# Patient Record
Sex: Female | Born: 1955
Health system: Southern US, Community
[De-identification: ages and names within clinical notes are randomized; demographics above are authoritative.]

## PROBLEM LIST (undated history)

## (undated) ENCOUNTER — Emergency Department (HOSPITAL_COMMUNITY): Payer: BC Managed Care – PPO

## (undated) DIAGNOSIS — E785 Hyperlipidemia, unspecified: Secondary | ICD-10-CM

## (undated) DIAGNOSIS — J309 Allergic rhinitis, unspecified: Secondary | ICD-10-CM

## (undated) DIAGNOSIS — M1990 Unspecified osteoarthritis, unspecified site: Secondary | ICD-10-CM

## (undated) DIAGNOSIS — I10 Essential (primary) hypertension: Secondary | ICD-10-CM

## (undated) DIAGNOSIS — I679 Cerebrovascular disease, unspecified: Secondary | ICD-10-CM

## (undated) DIAGNOSIS — K219 Gastro-esophageal reflux disease without esophagitis: Secondary | ICD-10-CM

## (undated) DIAGNOSIS — Q231 Congenital insufficiency of aortic valve: Secondary | ICD-10-CM

## (undated) DIAGNOSIS — R002 Palpitations: Secondary | ICD-10-CM

## (undated) DIAGNOSIS — E559 Vitamin D deficiency, unspecified: Secondary | ICD-10-CM

## (undated) DIAGNOSIS — J45909 Unspecified asthma, uncomplicated: Secondary | ICD-10-CM

## (undated) DIAGNOSIS — B009 Herpesviral infection, unspecified: Secondary | ICD-10-CM

## (undated) HISTORY — DX: Cerebrovascular disease, unspecified: I67.9

## (undated) HISTORY — DX: Unspecified osteoarthritis, unspecified site: M19.90

## (undated) HISTORY — DX: Unspecified asthma, uncomplicated: J45.909

## (undated) HISTORY — DX: Gastro-esophageal reflux disease without esophagitis: K21.9

## (undated) HISTORY — DX: Herpesviral infection, unspecified: B00.9

## (undated) HISTORY — DX: Palpitations: R00.2

## (undated) HISTORY — DX: Hyperlipidemia, unspecified: E78.5

## (undated) HISTORY — DX: Congenital insufficiency of aortic valve: Q23.1

## (undated) HISTORY — DX: Allergic rhinitis, unspecified: J30.9

## (undated) HISTORY — PX: EYE SURGERY: SHX253

## (undated) HISTORY — DX: Vitamin D deficiency, unspecified: E55.9

## (undated) HISTORY — PX: OTHER SURGICAL HISTORY: SHX169

## (undated) HISTORY — PX: ABDOMINAL HYSTERECTOMY: SHX81

---

## 1997-12-12 ENCOUNTER — Other Ambulatory Visit: Admission: RE | Admit: 1997-12-12 | Discharge: 1997-12-12 | Payer: Self-pay | Admitting: General Surgery

## 1998-07-28 ENCOUNTER — Other Ambulatory Visit: Admission: RE | Admit: 1998-07-28 | Discharge: 1998-07-28 | Payer: Self-pay | Admitting: *Deleted

## 1999-10-13 ENCOUNTER — Other Ambulatory Visit: Admission: RE | Admit: 1999-10-13 | Discharge: 1999-10-13 | Payer: Self-pay | Admitting: *Deleted

## 2000-05-18 ENCOUNTER — Ambulatory Visit (HOSPITAL_COMMUNITY): Admission: RE | Admit: 2000-05-18 | Discharge: 2000-05-18 | Payer: Self-pay | Admitting: Internal Medicine

## 2000-05-18 ENCOUNTER — Encounter: Payer: Self-pay | Admitting: Internal Medicine

## 2000-10-04 ENCOUNTER — Other Ambulatory Visit: Admission: RE | Admit: 2000-10-04 | Discharge: 2000-10-04 | Payer: Self-pay | Admitting: *Deleted

## 2001-01-25 ENCOUNTER — Encounter: Payer: Self-pay | Admitting: Emergency Medicine

## 2001-01-25 ENCOUNTER — Emergency Department (HOSPITAL_COMMUNITY): Admission: EM | Admit: 2001-01-25 | Discharge: 2001-01-25 | Payer: Self-pay | Admitting: Emergency Medicine

## 2002-01-17 ENCOUNTER — Encounter: Payer: Self-pay | Admitting: Internal Medicine

## 2002-01-17 ENCOUNTER — Ambulatory Visit (HOSPITAL_COMMUNITY): Admission: RE | Admit: 2002-01-17 | Discharge: 2002-01-17 | Payer: Self-pay | Admitting: Internal Medicine

## 2003-05-26 ENCOUNTER — Emergency Department (HOSPITAL_COMMUNITY): Admission: EM | Admit: 2003-05-26 | Discharge: 2003-05-26 | Payer: Self-pay | Admitting: Emergency Medicine

## 2004-04-08 ENCOUNTER — Other Ambulatory Visit: Admission: RE | Admit: 2004-04-08 | Discharge: 2004-04-08 | Payer: Self-pay | Admitting: Internal Medicine

## 2004-06-05 ENCOUNTER — Encounter: Admission: RE | Admit: 2004-06-05 | Discharge: 2004-06-05 | Payer: Self-pay | Admitting: Internal Medicine

## 2004-10-31 ENCOUNTER — Emergency Department (HOSPITAL_COMMUNITY): Admission: EM | Admit: 2004-10-31 | Discharge: 2004-10-31 | Payer: Self-pay | Admitting: Family Medicine

## 2005-03-05 ENCOUNTER — Emergency Department (HOSPITAL_COMMUNITY): Admission: EM | Admit: 2005-03-05 | Discharge: 2005-03-05 | Payer: Self-pay | Admitting: Family Medicine

## 2005-07-15 ENCOUNTER — Ambulatory Visit (HOSPITAL_COMMUNITY): Admission: RE | Admit: 2005-07-15 | Discharge: 2005-07-15 | Payer: Self-pay | Admitting: Internal Medicine

## 2006-03-24 ENCOUNTER — Other Ambulatory Visit: Admission: RE | Admit: 2006-03-24 | Discharge: 2006-03-24 | Payer: Self-pay | Admitting: Internal Medicine

## 2006-07-18 ENCOUNTER — Ambulatory Visit (HOSPITAL_COMMUNITY): Admission: RE | Admit: 2006-07-18 | Discharge: 2006-07-18 | Payer: Self-pay | Admitting: Internal Medicine

## 2007-01-16 ENCOUNTER — Ambulatory Visit (HOSPITAL_COMMUNITY): Admission: RE | Admit: 2007-01-16 | Discharge: 2007-01-16 | Payer: Self-pay | Admitting: Internal Medicine

## 2007-01-20 ENCOUNTER — Encounter (INDEPENDENT_AMBULATORY_CARE_PROVIDER_SITE_OTHER): Payer: Self-pay | Admitting: Internal Medicine

## 2007-01-20 ENCOUNTER — Ambulatory Visit: Payer: Self-pay

## 2007-06-07 ENCOUNTER — Ambulatory Visit: Payer: Self-pay | Admitting: Cardiology

## 2007-06-16 ENCOUNTER — Ambulatory Visit: Payer: Self-pay

## 2007-07-14 ENCOUNTER — Ambulatory Visit: Payer: Self-pay | Admitting: Cardiology

## 2007-08-11 ENCOUNTER — Ambulatory Visit (HOSPITAL_COMMUNITY): Admission: RE | Admit: 2007-08-11 | Discharge: 2007-08-11 | Payer: Self-pay | Admitting: Internal Medicine

## 2008-04-24 ENCOUNTER — Other Ambulatory Visit: Admission: RE | Admit: 2008-04-24 | Discharge: 2008-04-24 | Payer: Self-pay | Admitting: Internal Medicine

## 2008-07-02 DIAGNOSIS — J45909 Unspecified asthma, uncomplicated: Secondary | ICD-10-CM | POA: Insufficient documentation

## 2008-07-02 DIAGNOSIS — I679 Cerebrovascular disease, unspecified: Secondary | ICD-10-CM | POA: Insufficient documentation

## 2008-07-02 DIAGNOSIS — M1990 Unspecified osteoarthritis, unspecified site: Secondary | ICD-10-CM | POA: Insufficient documentation

## 2008-07-02 DIAGNOSIS — K219 Gastro-esophageal reflux disease without esophagitis: Secondary | ICD-10-CM | POA: Insufficient documentation

## 2008-07-03 ENCOUNTER — Ambulatory Visit: Payer: Self-pay | Admitting: Cardiology

## 2008-07-03 DIAGNOSIS — Q231 Congenital insufficiency of aortic valve: Secondary | ICD-10-CM | POA: Insufficient documentation

## 2008-07-26 ENCOUNTER — Encounter: Payer: Self-pay | Admitting: Cardiology

## 2008-07-26 ENCOUNTER — Ambulatory Visit: Payer: Self-pay

## 2008-08-13 ENCOUNTER — Ambulatory Visit (HOSPITAL_COMMUNITY): Admission: RE | Admit: 2008-08-13 | Discharge: 2008-08-13 | Payer: Self-pay | Admitting: Internal Medicine

## 2008-11-06 ENCOUNTER — Ambulatory Visit: Payer: Self-pay | Admitting: Hematology & Oncology

## 2008-11-14 LAB — CBC WITH DIFFERENTIAL (CANCER CENTER ONLY)
BASO#: 0.1 10*3/uL (ref 0.0–0.2)
BASO%: 0.6 % (ref 0.0–2.0)
EOS%: 2 % (ref 0.0–7.0)
Eosinophils Absolute: 0.2 10*3/uL (ref 0.0–0.5)
HCT: 36.1 % (ref 34.8–46.6)
HGB: 11.9 g/dL (ref 11.6–15.9)
LYMPH#: 2.5 10*3/uL (ref 0.9–3.3)
LYMPH%: 32.4 % (ref 14.0–48.0)
MCH: 29.8 pg (ref 26.0–34.0)
MCHC: 33.1 g/dL (ref 32.0–36.0)
MCV: 90 fL (ref 81–101)
MONO#: 0.4 10*3/uL (ref 0.1–0.9)
MONO%: 5.6 % (ref 0.0–13.0)
NEUT#: 4.6 10*3/uL (ref 1.5–6.5)
NEUT%: 59.4 % (ref 39.6–80.0)
Platelets: 258 10*3/uL (ref 145–400)
RBC: 4 10*6/uL (ref 3.70–5.32)
RDW: 12.7 % (ref 10.5–14.6)
WBC: 7.7 10*3/uL (ref 3.9–10.0)

## 2008-11-14 LAB — CHCC SATELLITE - SMEAR

## 2008-11-17 LAB — RETICULOCYTES (CHCC)
ABS Retic: 56.4 10*3/uL (ref 19.0–186.0)
RBC.: 4.03 MIL/uL (ref 3.87–5.11)
Retic Ct Pct: 1.4 % (ref 0.4–3.1)

## 2008-11-17 LAB — TRANSFERRIN RECEPTOR, SOLUABLE: Transferrin Receptor, Soluble: 17.8 nmol/L

## 2008-11-17 LAB — IRON AND TIBC
%SAT: 10 % — ABNORMAL LOW (ref 20–55)
Iron: 34 ug/dL — ABNORMAL LOW (ref 42–145)
TIBC: 331 ug/dL (ref 250–470)
UIBC: 297 ug/dL

## 2008-11-17 LAB — FERRITIN: Ferritin: 16 ng/mL (ref 10–291)

## 2009-04-10 ENCOUNTER — Ambulatory Visit: Payer: Self-pay | Admitting: Cardiology

## 2009-04-17 ENCOUNTER — Ambulatory Visit: Payer: Self-pay | Admitting: Cardiology

## 2009-04-17 LAB — CONVERTED CEMR LAB
BUN: 12 mg/dL (ref 6–23)
CO2: 33 meq/L — ABNORMAL HIGH (ref 19–32)
Calcium: 9.4 mg/dL (ref 8.4–10.5)
Chloride: 102 meq/L (ref 96–112)
Creatinine, Ser: 0.6 mg/dL (ref 0.4–1.2)
GFR calc non Af Amer: 134.25 mL/min (ref >60)
Glucose, Bld: 79 mg/dL (ref 70–99)
Potassium: 3.2 meq/L — ABNORMAL LOW (ref 3.5–5.1)
Sodium: 141 meq/L (ref 135–145)

## 2009-04-28 ENCOUNTER — Ambulatory Visit: Payer: Self-pay | Admitting: Cardiology

## 2009-04-30 LAB — CONVERTED CEMR LAB
BUN: 14 mg/dL (ref 6–23)
CO2: 32 meq/L (ref 19–32)
Calcium: 9.4 mg/dL (ref 8.4–10.5)
Chloride: 106 meq/L (ref 96–112)
Creatinine, Ser: 0.6 mg/dL (ref 0.4–1.2)
GFR calc non Af Amer: 134.23 mL/min (ref >60)
Glucose, Bld: 108 mg/dL — ABNORMAL HIGH (ref 70–99)
Potassium: 3.6 meq/L (ref 3.5–5.1)
Sodium: 141 meq/L (ref 135–145)

## 2009-05-07 ENCOUNTER — Encounter: Payer: Self-pay | Admitting: Cardiology

## 2009-05-07 ENCOUNTER — Ambulatory Visit: Payer: Self-pay

## 2009-07-29 ENCOUNTER — Encounter: Admission: RE | Admit: 2009-07-29 | Discharge: 2009-07-29 | Payer: Self-pay | Admitting: Internal Medicine

## 2009-08-07 ENCOUNTER — Ambulatory Visit: Payer: Self-pay | Admitting: Hematology & Oncology

## 2009-08-08 ENCOUNTER — Ambulatory Visit (HOSPITAL_BASED_OUTPATIENT_CLINIC_OR_DEPARTMENT_OTHER): Admission: RE | Admit: 2009-08-08 | Discharge: 2009-08-08 | Payer: Self-pay | Admitting: Hematology & Oncology

## 2009-08-08 ENCOUNTER — Ambulatory Visit: Payer: Self-pay | Admitting: Radiology

## 2009-08-08 LAB — CBC WITH DIFFERENTIAL (CANCER CENTER ONLY)
BASO#: 0 10*3/uL (ref 0.0–0.2)
BASO%: 0.4 % (ref 0.0–2.0)
EOS%: 1.1 % (ref 0.0–7.0)
Eosinophils Absolute: 0.1 10*3/uL (ref 0.0–0.5)
HCT: 37.1 % (ref 34.8–46.6)
HGB: 12.5 g/dL (ref 11.6–15.9)
LYMPH#: 1.5 10*3/uL (ref 0.9–3.3)
LYMPH%: 14.7 % (ref 14.0–48.0)
MCH: 31.2 pg (ref 26.0–34.0)
MCHC: 33.7 g/dL (ref 32.0–36.0)
MCV: 93 fL (ref 81–101)
MONO#: 0.3 10*3/uL (ref 0.1–0.9)
MONO%: 3.2 % (ref 0.0–13.0)
NEUT#: 8.2 10*3/uL — ABNORMAL HIGH (ref 1.5–6.5)
NEUT%: 80.6 % — ABNORMAL HIGH (ref 39.6–80.0)
Platelets: 264 10*3/uL (ref 145–400)
RBC: 4.01 10*6/uL (ref 3.70–5.32)
RDW: 12.1 % (ref 10.5–14.6)
WBC: 10.2 10*3/uL — ABNORMAL HIGH (ref 3.9–10.0)

## 2009-08-08 LAB — CHCC SATELLITE - SMEAR

## 2009-08-10 LAB — RETICULOCYTES (CHCC)
ABS Retic: 55 10*3/uL (ref 19.0–186.0)
RBC.: 3.93 MIL/uL (ref 3.87–5.11)
Retic Ct Pct: 1.4 % (ref 0.4–3.1)

## 2009-08-10 LAB — FERRITIN: Ferritin: 63 ng/mL (ref 10–291)

## 2009-08-10 LAB — TRANSFERRIN RECEPTOR, SOLUABLE: Transferrin Receptor, Soluble: 17.1 nmol/L

## 2009-08-15 ENCOUNTER — Ambulatory Visit (HOSPITAL_COMMUNITY): Admission: RE | Admit: 2009-08-15 | Discharge: 2009-08-15 | Payer: Self-pay | Admitting: Internal Medicine

## 2009-10-30 ENCOUNTER — Ambulatory Visit: Payer: Self-pay | Admitting: Hematology & Oncology

## 2009-10-31 LAB — CBC WITH DIFFERENTIAL (CANCER CENTER ONLY)
BASO#: 0 10*3/uL (ref 0.0–0.2)
BASO%: 0.4 % (ref 0.0–2.0)
EOS%: 1.1 % (ref 0.0–7.0)
Eosinophils Absolute: 0.1 10*3/uL (ref 0.0–0.5)
HCT: 38.4 % (ref 34.8–46.6)
HGB: 12.5 g/dL (ref 11.6–15.9)
LYMPH#: 2.1 10*3/uL (ref 0.9–3.3)
LYMPH%: 22.6 % (ref 14.0–48.0)
MCH: 30.3 pg (ref 26.0–34.0)
MCHC: 32.6 g/dL (ref 32.0–36.0)
MCV: 93 fL (ref 81–101)
MONO#: 0.5 10*3/uL (ref 0.1–0.9)
MONO%: 5.4 % (ref 0.0–13.0)
NEUT#: 6.6 10*3/uL — ABNORMAL HIGH (ref 1.5–6.5)
NEUT%: 70.5 % (ref 39.6–80.0)
Platelets: 294 10*3/uL (ref 145–400)
RBC: 4.12 10*6/uL (ref 3.70–5.32)
RDW: 11.9 % (ref 10.5–14.6)
WBC: 9.3 10*3/uL (ref 3.9–10.0)

## 2009-10-31 LAB — IRON AND TIBC
%SAT: 16 % — ABNORMAL LOW (ref 20–55)
Iron: 52 ug/dL (ref 42–145)
TIBC: 330 ug/dL (ref 250–470)
UIBC: 278 ug/dL

## 2009-10-31 LAB — RETICULOCYTES (CHCC)
ABS Retic: 53.6 10*3/uL (ref 19.0–186.0)
RBC.: 4.12 MIL/uL (ref 3.87–5.11)
Retic Ct Pct: 1.3 % (ref 0.4–3.1)

## 2009-10-31 LAB — FERRITIN: Ferritin: 49 ng/mL (ref 10–291)

## 2009-10-31 LAB — CHCC SATELLITE - SMEAR

## 2010-01-22 ENCOUNTER — Ambulatory Visit: Payer: Self-pay | Admitting: Hematology & Oncology

## 2010-01-29 LAB — HM COLONOSCOPY

## 2010-03-17 NOTE — Assessment & Plan Note (Signed)
Summary: f1y/jss   History of Present Illness: The patient is a pleasant female that I have seen in the past for an abnormal electrocardiogram and palpitations.  The patient had a Myoview performed on Jun 16, 2007, that showed no ischemia or infarction.  The ejection fraction was 74%.  She also had carotid Dopplers performed on Jun 16, 2007, that showed 0% to 39% bilateral stenosis.  Finally, she had a CardioNet that showed no significant arrhythmias.  Note, she did not have palpitations during that time. I last saw her on Jul 13, 2008. We scheduled an echocardiogram which was performed in May of 2010 to reassess her history of bicuspid aortic valve. The aortic valve was not well visualized but there was no aortic insufficiency or aortic stenosis. LV function was normal. Since that time she is doing well symptomatically. She denies any dyspnea on exertion, orthopnea, pedal edema,  palpitations or syncope. She occasionally has some tightness in her chest when she lays flat but it improves with sitting up. She does not have exertional chest pain.  Current Medications (verified): 1)  Metformin Hcl 1000 Mg Tabs (Metformin Hcl) .... One Tablet Two Times A Day 2)  Glyburide 5 Mg Tabs (Glyburide) .... 1/2 Tab By Mouth Two Times A Day 3)  Hydrochlorothiazide 25 Mg Tabs (Hydrochlorothiazide) .... Take One Tablet By Mouth Daily. 4)  Pravastatin Sodium 40 Mg Tabs (Pravastatin Sodium) .... Take One Tablet By Mouth Everyother Day At Bedtime 5)  Aspirin 81 Mg Tbec (Aspirin) .... Take One Tablet By Mouth Daily 6)  Fish Oil   Oil (Fish Oil) .[name].. 1 Tab By Mouth Once Daily 7)  Calcium 600 Mg Tabs (Calcium) .[name].. 1 Tab By Mouth Once Daily 8)  Vitamin D 2000 Unit Tabs (Cholecalciferol) .... One Tablet Two Times A Day 9)  Multi-Vitamin  Tabs (Multiple Vitamin) .... One Tablet Daily 10)  Magonate 500 Mg Tabs (Magnesium Gluconate) .... 2 Tabs By Mouth Once Daily 11)  Vitamin C 1000 Mg Tabs (Ascorbic Acid) .... Tab By Mouth  Once Daily 12)  Omeprazole 40 Mg Cpdr (Omeprazole) .[name].. 1 Tab By Mouth Once Daily 13)  Ferrous Sulfate 325 (65 Fe) Mg  Tabs (Ferrous Sulfate) .[name].. 1 Tab By Mouth Once Daily 14)  Fiber 625 Mg Tabs (Calcium Polycarbophil) .[name].. 1 Tab By Mouth Once Daily  Allergies: No Known Drug Allergies  Past History:  Past Medical History: CEREBROVASCULAR DISEASE (ICD-437.9) GERD (ICD-530.81) HYPERLIPIDEMIA (ICD-272.4) HYPERTENSION (ICD-401.9) PALPITATIONS (ICD-785.1) DEGENERATIVE JOINT DISEASE (ICD-715.90) DIABETES MELLITUS (ICD-250.00) ASTHMA (ICD-493.90)  Past Surgical History: Reviewed history from 07/02/2008 and no changes required. repair of trigger finger.   Social History: Reviewed history from 07/02/2008 and no changes required. She does not smoke, rarely consumes alcohol.      Review of Systems       Occasional leg pain but no fevers or chills, productive cough, hemoptysis, dysphasia, odynophagia, melena, hematochezia, dysuria, hematuria, rash, seizure activity, orthopnea, PND, pedal edema, claudication. Remaining systems are negative.   Vital Signs:  Patient profile:   55 year old female Height:      58 inches Weight:      142 pounds BMI:     29.79 Pulse rate:   85 / minute Resp:     12 per minute BP sitting:   144 / 76  (left arm)  Vitals Entered By: Kem Parkinson (April 10, 2009 10:31 AM)  Physical Exam  General:  Well-developed well-nourished in no acute distress.  Skin is warm and dry.  HEENT is normal.  Neck is supple. No thyromegaly.  Chest is clear to auscultation with normal expansion.  Cardiovascular exam is regular rate and rhythm.  Abdominal exam nontender or distended. No masses palpated. Extremities show no edema. neuro grossly intact    EKG  Procedure date:  04/10/2009  Findings:      Sinus rhythm at a rate of 85. Cannot rule out prior septal and inferior infarcts.  Impression & Recommendations:  Problem # 1:  CEREBROVASCULAR DISEASE  (ICD-437.9)  Continue aspirin and statin. Schedule followup carotid Dopplers.  Orders: Carotid Duplex (Carotid Duplex)  Problem # 2:  HYPERTENSION (ICD-401.9) Blood pressure elevated. Add lisinopril 10 mg p.o. daily. Check bmet in one week. Her updated medication list for this problem includes:    Hydrochlorothiazide 25 Mg Tabs (Hydrochlorothiazide) .[name]... Take one tablet by mouth daily.    Aspirin 81 Mg Tbec (Aspirin) .[name]... Take one tablet by mouth daily    Lisinopril 10 Mg Tabs (Lisinopril) .[name]... Take one tablet by mouth daily  Problem # 3:  HYPERLIPIDEMIA (ICD-272.4) Continue statin. Lipids and liver monitored by her primary care. Her updated medication list for this problem includes:    Pravastatin Sodium 40 Mg Tabs (Pravastatin sodium) .[name]... Take one tablet by mouth everyother day at bedtime  Problem # 4:  BICUSPID AORTIC VALVE (ICD-746.4) Recent echocardiogram could not evaluate this. However no aortic stenosis or aortic insufficiency. Her updated medication list for this problem includes:    Aspirin 81 Mg Tbec (Aspirin) .[name]... Take one tablet by mouth daily    Lisinopril 10 Mg Tabs (Lisinopril) .[name]... Take one tablet by mouth daily  Problem # 5:  GERD (ICD-530.81)  The following medications were removed from the medication list:    Aciphex 20 Mg Tbec (Rabeprazole sodium) ..... One tablet daily Her updated medication list for this problem includes:    Omeprazole 40 Mg Cpdr (Omeprazole) .[name]... 1 tab by mouth once daily  Problem # 6:  PALPITATIONS (ICD-785.1) no recent symptoms. Her updated medication list for this problem includes:    Aspirin 81 Mg Tbec (Aspirin) .[name]... Take one tablet by mouth daily    Lisinopril 10 Mg Tabs (Lisinopril) .[name]... Take one tablet by mouth daily  Problem # 7:  DIABETES MELLITUS (ICD-250.00)  Her updated medication list for this problem includes:    Metformin Hcl 1000 Mg Tabs (Metformin hcl) ..... One tablet two times a day    Glyburide 5 Mg Tabs  (Glyburide) .[name]... 1/2 tab by mouth two times a day    Aspirin 81 Mg Tbec (Aspirin) .[name]... Take one tablet by mouth daily    Lisinopril 10 Mg Tabs (Lisinopril) .[name]... Take one tablet by mouth daily  Problem # 8:  ASTHMA (ICD-493.90)  Patient Instructions: 1)  Your physician recommends that you schedule a follow-up appointment in: one year 2)  Your physician recommends that you return for lab work in: 1 week--BMET 3)  Your physician has recommended you make the following change in your medication: please start lisinopril 10 mg everyday 4)  Your physician has requested that you have a carotid duplex. This test is an ultrasound of the carotid arteries in your neck. It looks at blood flow through these arteries that supply the brain with blood. Allow one hour for this exam. There are no restrictions or special instructions. Prescriptions: LISINOPRIL 10 MG TABS (LISINOPRIL) Take one tablet by mouth daily  #90 x 3   Entered by:   Ledon Snare, RN   Authorized  by:   Ferman Hamming, MD, [facility]   Signed by:   Ledon Snare, RN on 04/10/2009   Method used:   Electronically to        [facility] [phone] (retail)       [address]       [place], [place]  [address]       Ph: [phone]       Fax: [phone]   RxID:   [id number]

## 2010-03-17 NOTE — Assessment & Plan Note (Signed)
Summary: PER CHECK OUT/SF   History of Present Illness: The patient is a pleasant female that I have seen in the past for an abnormal electrocardiogram and palpitations.  The patient had a Myoview performed on Jun 16, 2007, that showed no ischemia or infarction.  The ejection fraction was 74%.  She also had carotid Dopplers performed on Jun 16, 2007, that showed 0% to 39% bilateral stenosis.  Finally, she had a CardioNet that showed no significant arrhythmias.  Note, she did not have palpitations during that time. I last saw her on Jul 14, 2007. Since that time she is doing well symptomatically. She denies any dyspnea on exertion, orthopnea, pedal edema, chest pain, palpitations or syncope.  Current Medications (verified): 1)  Metformin Hcl 1000 Mg Tabs (Metformin Hcl) .... One Tablet Two Times A Day 2)  Glyburide 5 Mg Tabs (Glyburide) .... 1/2 Tablet Daily 3)  Hydrochlorothiazide 25 Mg Tabs (Hydrochlorothiazide) .... Take One Tablet By Mouth Daily. 4)  Metoclopramide Hcl 10 Mg Tabs (Metoclopramide Hcl) .... One Tablet Daily 5)  Aciphex 20 Mg Tbec (Rabeprazole Sodium) .... One Tablet Daily 6)  Pravastatin Sodium 40 Mg Tabs (Pravastatin Sodium) .... Take One Tablet By Mouth Everyother Day At Bedtime 7)  Magnesium .... One Tablet Daily 8)  Aspirin 81 Mg Tbec (Aspirin) .... Take One Tablet By Mouth Daily 9)  Fish Oil .... One Tablet Daily 10)  Calcium .... One Tablet Daily 11)  Vitamin D 2000 Unit Tabs (Cholecalciferol) .... One Tablet Two Times A Day 12)  Multi-Vitamin  Tabs (Multiple Vitamin) .... One Tablet Daily 13)  Glucosamine .... One Tablet Daily  Allergies (verified): No Known Drug Allergies  Past History:  Past Medical History:    Reviewed history from 07/02/2008 and no changes required:    Current Problems:     CEREBROVASCULAR DISEASE (ICD-437.9)    CAROTID BRUIT, LEFT (ICD-785.9)    GERD (ICD-530.81)    HYPERLIPIDEMIA (ICD-272.4)    HYPERTENSION (ICD-401.9)    PALPITATIONS  (ICD-785.1)    DEGENERATIVE JOINT DISEASE (ICD-715.90)    DIABETES MELLITUS (ICD-250.00)    ASTHMA (ICD-493.90)  Social History:    Reviewed history from 07/02/2008 and no changes required:       She does not smoke, rarely consumes alcohol.            Review of Systems       no fevers or chills, productive cough, hemoptysis, dysphasia, odynophagia, melena, hematochezia, dysuria, hematuria, rash, seizure activity, orthopnea, PND, pedal edema, claudication. Remaining systems are negative.   Vital Signs:  Patient profile:   55 year old female Height:      58 inches Weight:      140 pounds BMI:     29.37 Pulse rate:   76 / minute Pulse rhythm:   regular BP sitting:   130 / 60 Cuff size:   regular  Physical Exam  General:  Well-developed well-nourished in no acute distress. Head:  HEENT is significant for a right lazy eye. normal eyelids. Neck:  Supple with no jugular venous distention and no thyromegaly noted. Left carotid bruit noted. Lungs:  clear to auscultation with normal expansion. Heart:   Regular rate and rhythm no  rubs or gallops noted. 2/6 systolic murmur at left sternal border. S2 is preserved. Abdomen:  soft and nontender. No masses palpated. No bruit noted. Extremities:  no edema. Neurologic:  grossly intact.   EKG  Procedure date:  07/03/2008  Findings:      Normal sinus  rhythm at rate of 76. Axis is normal. Cannot rule out prior septal infarct.  Impression & Recommendations:  Problem # 1:  BICUSPID AORTIC VALVE (ICD-746.4) We will repeat her echocardiogram. Her updated medication list for this problem includes:    Aspirin 81 Mg Tbec (Aspirin) .[name]... Take one tablet by mouth daily  Orders: Echocardiogram (Echo)  Problem # 2:  CEREBROVASCULAR DISEASE (ICD-437.9) Her previous carotid Doppler showed mild cerebrovascular disease. We will repeat those in one year when she returns. Continue aspirin and statin.  Problem # 3:  HYPERLIPIDEMIA  (ICD-272.4) Continue statin. Lipids and liver being monitored by her primary care physician. Her updated medication list for this problem includes:    Pravastatin Sodium 40 Mg Tabs (Pravastatin sodium) .[name]... Take one tablet by mouth everyother day at bedtime  Problem # 4:  HYPERTENSION (ICD-401.9) Blood pressure controlled on present medications. Will continue. Her primary care physician is monitoring her renal function and potassium. Her updated medication list for this problem includes:    Hydrochlorothiazide 25 Mg Tabs (Hydrochlorothiazide) .[name]... Take one tablet by mouth daily.    Aspirin 81 Mg Tbec (Aspirin) .[name]... Take one tablet by mouth daily  Problem # 5:  GERD (ICD-530.81)  Her updated medication list for this problem includes:    Aciphex 20 Mg Tbec (Rabeprazole sodium) ..... One tablet daily  Problem # 6:  DIABETES MELLITUS (ICD-250.00)  Her updated medication list for this problem includes:    Metformin Hcl 1000 Mg Tabs (Metformin hcl) ..... One tablet two times a day    Glyburide 5 Mg Tabs (Glyburide) .[name]... 1/2 tablet daily    Aspirin 81 Mg Tbec (Aspirin) .[name]... Take one tablet by mouth daily  Problem # 7:  PALPITATIONS (ICD-785.1) No recent symptoms. Her updated medication list for this problem includes:    Aspirin 81 Mg Tbec (Aspirin) .[name]... Take one tablet by mouth daily  Orders: EKG w/ Interpretation (93000)  Patient Instructions: 1)  Your physician recommends that you schedule a follow-up appointment in: ONE YEAR 2)  Your physician has requested that you have an echocardiogram.  Echocardiography is a painless test that uses sound waves to create images of your heart. It provides your doctor with information about the size and shape of your heart and how well your heart's chambers and valves are working.  This procedure takes approximately one hour. There are no restrictions for this procedure.

## 2010-03-24 ENCOUNTER — Encounter: Payer: Self-pay | Admitting: Cardiology

## 2010-03-24 ENCOUNTER — Ambulatory Visit (INDEPENDENT_AMBULATORY_CARE_PROVIDER_SITE_OTHER): Payer: BC Managed Care – PPO | Admitting: Cardiology

## 2010-03-24 DIAGNOSIS — I679 Cerebrovascular disease, unspecified: Secondary | ICD-10-CM

## 2010-03-24 DIAGNOSIS — E7800 Pure hypercholesterolemia, unspecified: Secondary | ICD-10-CM

## 2010-03-24 DIAGNOSIS — I10 Essential (primary) hypertension: Secondary | ICD-10-CM

## 2010-04-02 NOTE — Assessment & Plan Note (Signed)
Summary: rov/gd per pt call/gd   Visit Type:  Follow-up  CC:  '.  History of Present Illness: The patient is a pleasant female that I have seen in the past for an abnormal electrocardiogram and palpitations.  The patient had a Myoview performed on Jun 16, 2007, that showed no ischemia or infarction.  The ejection fraction was 74%.  She also had carotid Dopplers performed last in March of 2011 and showed 0% to 39% bilateral stenosis and thyroid cysts.  She had a CardioNet that showed no significant arrhythmias.  Note, she did not have palpitations during that time. Echocardiogram  was performed in June of 2010 to reassess her history of bicuspid aortic valve. The aortic valve was not well visualized but there was no aortic insufficiency or aortic stenosis. LV function was normal. Since I last saw her in Feb of 2011 she is doing well symptomatically. She denies any dyspnea on exertion, orthopnea, pedal edema,  palpitations or syncope. She does not have exertional chest pain.  Current Medications (verified): 1)  Metformin Hcl 1000 Mg Tabs (Metformin Hcl) .... One Tablet Two Times A Day 2)  Glyburide 5 Mg Tabs (Glyburide) .... 1/2 Tab By Mouth Two Times A Day 3)  Hydrochlorothiazide 25 Mg Tabs (Hydrochlorothiazide) .... Take One Tablet By Mouth Daily. 4)  Pravastatin Sodium 40 Mg Tabs (Pravastatin Sodium) .... Take One Tablet By Mouth Everyother Day At Bedtime 5)  Aspirin 81 Mg Tbec (Aspirin) .... Take One Tablet By Mouth Daily 6)  Fish Oil   Oil (Fish Oil) .[name].. 1 Tab By Mouth Once Daily 7)  Calcium 600 Mg Tabs (Calcium) .[name].. 1 Tab By Mouth Once Daily 8)  Vitamin D 2000 Unit Tabs (Cholecalciferol) .... One Tablet Two Times A Day 9)  Multi-Vitamin  Tabs (Multiple Vitamin) .... One Tablet Daily 10)  Magonate 500 Mg Tabs (Magnesium Gluconate) .... 2 Tabs By Mouth Once Daily 11)  Ferrous Sulfate 325 (65 Fe) Mg  Tabs (Ferrous Sulfate) .[name].. 1 Tab By Mouth Once Daily 12)  Fiber 625 Mg Tabs (Calcium  Polycarbophil) .[name].. 1 Tab By Mouth Once Daily 13)  Lisinopril 10 Mg Tabs (Lisinopril) .... Take One Tablet By Mouth Daily 14)  Klor-Con M20 20 Meq Cr-Tabs (Potassium Chloride Crys Cr) .... One Tablet By Mouth Once Daily  Allergies (verified): No Known Drug Allergies  Past History:  Past Medical History: Reviewed history from 04/10/2009 and no changes required. CEREBROVASCULAR DISEASE (ICD-437.9) GERD (ICD-530.81) HYPERLIPIDEMIA (ICD-272.4) HYPERTENSION (ICD-401.9) PALPITATIONS (ICD-785.1) DEGENERATIVE JOINT DISEASE (ICD-715.90) DIABETES MELLITUS (ICD-250.00) ASTHMA (ICD-493.90)  Past Surgical History: Reviewed history from 07/02/2008 and no changes required. repair of trigger finger.   Social History: Reviewed history from 07/02/2008 and no changes required. She does not smoke, rarely consumes alcohol.      Review of Systems       no fevers or chills, productive cough, hemoptysis, dysphasia, odynophagia, melena, hematochezia, dysuria, hematuria, rash, seizure activity, orthopnea, PND, pedal edema, claudication. Remaining systems are negative.   Vital Signs:  Patient profile:   55 year old female Height:      58 inches Weight:      133 pounds BMI:     27.90 Pulse rate:   84 / minute Pulse rhythm:   regular BP sitting:   116 / 69  (right arm)  Vitals Entered By: Jacquelin Hawking, CMA (March 24, 2010 8:59 AM)  Physical Exam  General:  Well-developed well-nourished in no acute distress.  Skin is warm and dry.  HEENT is normal.  Neck is supple. No thyromegaly.  Chest is clear to auscultation with normal expansion.  Cardiovascular exam is regular rate and rhythm.  Abdominal exam nontender or distended. No masses palpated. Extremities show no edema. neuro grossly intact    EKG  Procedure date:  03/24/2010  Findings:      Sinus rhythm at a rate of 76. Axis is normal. Cannot rule out prior septal infarct.  Impression & Recommendations:  Problem # 1:   BICUSPID AORTIC VALVE (ICD-746.4)  Not evident on previous echocardiogram but not well-visualized. She does have an ejection murmur. I will most likely repeat echocardiograms in the future. Her updated medication list for this problem includes:    Aspirin 81 Mg Tbec (Aspirin) .[name]... Take one tablet by mouth daily    Lisinopril 10 Mg Tabs (Lisinopril) .[name]... Take one tablet by mouth daily  Her updated medication list for this problem includes:    Aspirin 81 Mg Tbec (Aspirin) .[name]... Take one tablet by mouth daily    Lisinopril 10 Mg Tabs (Lisinopril) .[name]... Take one tablet by mouth daily  Problem # 2:  CEREBROVASCULAR DISEASE (ICD-437.9) Continue aspirin and statin. Followup carotid Dopplers March 2012. Orders: EKG w/ Interpretation (93000)  Problem # 3:  HYPERLIPIDEMIA (ICD-272.4)  Continue statin. Lipids and liver monitored by primary care. Her updated medication list for this problem includes:    Pravastatin Sodium 40 Mg Tabs (Pravastatin sodium) .[name]... Take one tablet by mouth everyother day at bedtime  Her updated medication list for this problem includes:    Pravastatin Sodium 40 Mg Tabs (Pravastatin sodium) .[name]... Take one tablet by mouth everyother day at bedtime  Problem # 4:  HYPERTENSION (ICD-401.9)  Blood pressure controlled on present medications. Will continue. Potassium and renal function monitored by primary care. Her updated medication list for this problem includes:    Hydrochlorothiazide 25 Mg Tabs (Hydrochlorothiazide) .[name]... Take one tablet by mouth daily.    Aspirin 81 Mg Tbec (Aspirin) .[name]... Take one tablet by mouth daily    Lisinopril 10 Mg Tabs (Lisinopril) .[name]... Take one tablet by mouth daily  Orders: EKG w/ Interpretation (93000)  Her updated medication list for this problem includes:    Hydrochlorothiazide 25 Mg Tabs (Hydrochlorothiazide) .[name]... Take one tablet by mouth daily.    Aspirin 81 Mg Tbec (Aspirin) .[name]... Take one tablet by mouth daily    Lisinopril  10 Mg Tabs (Lisinopril) .[name]... Take one tablet by mouth daily  Problem # 5:  DIABETES MELLITUS (ICD-250.00)  Her updated medication list for this problem includes:    Metformin Hcl 1000 Mg Tabs (Metformin hcl) ..... One tablet two times a day    Glyburide 5 Mg Tabs (Glyburide) .[name]... 1/2 tab by mouth two times a day    Aspirin 81 Mg Tbec (Aspirin) .[name]... Take one tablet by mouth daily    Lisinopril 10 Mg Tabs (Lisinopril) .[name]... Take one tablet by mouth daily  Her updated medication list for this problem includes:    Metformin Hcl 1000 Mg Tabs (Metformin hcl) ..... One tablet two times a day    Glyburide 5 Mg Tabs (Glyburide) .[name]... 1/2 tab by mouth two times a day    Aspirin 81 Mg Tbec (Aspirin) .[name]... Take one tablet by mouth daily    Lisinopril 10 Mg Tabs (Lisinopril) .[name]... Take one tablet by mouth daily  Problem # 6:  PALPITATIONS (ICD-785.1) No recent symptoms. Her updated medication list for this problem includes:    Aspirin 81  Mg Tbec (Aspirin) .[name]... Take one tablet by mouth daily    Lisinopril 10 Mg Tabs (Lisinopril) .[name]... Take one tablet by mouth daily  Her updated medication list for this problem includes:    Aspirin 81 Mg Tbec (Aspirin) .[name]... Take one tablet by mouth daily    Lisinopril 10 Mg Tabs (Lisinopril) .[name]... Take one tablet by mouth daily  Other Orders: Carotid Duplex (Carotid Duplex)  Patient Instructions: 1)  Your physician recommends that you schedule a follow-up appointment in: 1 year 2)  Your physician recommends that you continue on your current medications as directed. Please refer to the Current Medication list given to you today. 3)  Your physician has requested that you have a carotid duplex. This test is an ultrasound of the carotid arteries in your neck. It looks at blood flow through these arteries that supply the brain with blood. Allow one hour for this exam. There are no restrictions or special instructions.

## 2010-04-17 ENCOUNTER — Encounter (INDEPENDENT_AMBULATORY_CARE_PROVIDER_SITE_OTHER): Payer: BC Managed Care – PPO

## 2010-04-17 ENCOUNTER — Encounter: Payer: Self-pay | Admitting: Cardiology

## 2010-04-17 DIAGNOSIS — I6529 Occlusion and stenosis of unspecified carotid artery: Secondary | ICD-10-CM

## 2010-06-30 NOTE — Assessment & Plan Note (Signed)
[facility]                            CARDIOLOGY OFFICE NOTE   NAME:[name]                 MRN:          [id number]  DATE:06/07/2007                            DOB:          [birth year]    HISTORY OF PRESENT ILLNESS:  [name] is a very pleasant 55-  year-old female who I am asked to evaluate for an abnormal  electrocardiogram and palpitations.  She does have a history of a  Myoview performed on February 21, 2001, that showed ejection fraction of  68%, but no evidence of ischemia or infarction.  An echocardiogram in  December 2008 showed normal LV function.  There was a possibility of a  bicuspid aortic valve, but there was no clear aortic stenosis by Doppler  per Dr. Diona Browner.  [name] states that she has had problems  with palpitations.  These occur at night.  She wakes from sleep and  feels her heart racing.  This lasts 10-15 minutes and then it slowly  improves.  She has not had chest pain with this, shortness of breath,  presyncope or syncope.  She has mild dyspnea on exertion.  There is no  orthopnea, PND, pedal edema, or exertional chest pain.  Because of the  above, we were asked to further evaluate.  Also note that it was felt  that her electrocardiogram was abnormal as well.   MEDICATIONS:  1. Zantac 300 mg p.o. daily.  2. Metformin 1000 mg p.o. b.i.d.  3. __________ 10 mg p.o. t.i.d.  4. Pravachol 40 mg daily.  5. Glyburide 5 mg daily.  6. HCTZ 25 mg daily.  7. Aspirin 81 mg daily.  8. Multivitamin.  9. Fish oil.  10.Glucosamine.  11.Calcium.  12.Vitamin D.   ALLERGIES:  NO KNOWN DRUG ALLERGIES.   SOCIAL HISTORY:  She does not smoke, rarely consumes alcohol.   FAMILY HISTORY:  Positive for coronary artery disease in her mother.   PAST MEDICAL HISTORY:  1. Diabetes mellitus in 1995.  2. History of hypertension and hyperlipidemia.  3. History of asthma.  4. History of gastric reflux disease.  5. Prior hysterectomy.  6. History of DJD.  7. Prior repair of trigger finger.   REVIEW OF SYSTEMS:  She denies any headaches or fevers or chills.  She  does state that she has problems with reflux and has a cough after  eating.  There is no dysphagia, odynophagia, melena or hematochezia.  There is no hemoptysis.  She denies any dysuria, hematuria, rash or  seizure activity.  There is no orthopnea, PND or pedal edema.  She does  have some pain in her right leg, predominantly at night.  There is no  claudication.  The remaining systems are negative.   PHYSICAL EXAMINATION:  VITAL SIGNS:  Blood pressure of 125/77, pulse is  81.  She is well-developed, well-nourished, no acute distress.  SKIN:  Warm, dry.  She does not appear depressed.  There is no  peripheral clubbing.  She is status post repair of trigger finger on her  right hand. BACK:  Normal.  HEENT:  Significant for lateral deviation of  her right eye.  Her eyelids  are normal.  NECK:  Supple with a normal upstroke bilaterally.  There is a left  carotid bruit.  There is no thyromegaly noted.  CHEST:  Clear to auscultation.  Normal expansion.  CARDIOVASCULAR:  Regular rhythm with normal S1-S2.  There is a soft 1-  2/6 systolic murmur at the left sternal border.  There is no S3-S4.  ABDOMEN:  Nontender, nondistended.  Positive bowel sounds.  No  hepatosplenomegaly.  No mass appreciated.  There is no abdominal bruit.  She has 2+ femoral pulses bilaterally.  No bruits.  EXTREMITIES:  Show no edema that I can palpate.  No cords.  She has 2+  dorsalis pedis pulses bilaterally.  NEUROLOGICAL:  Grossly intact.  I do have an electrocardiogram from  March.  She has a sinus rhythm.  A prior septal infarct cannot be  excluded.  Note, a recent TSH was normal.   DIAGNOSES:  1. Abnormal electrocardiogram.  Her electrocardiogram is read as      cannot rule out prior septal infarct.  She also has mild dyspnea on      exertion as well as  approximately 13 years of diabetes mellitus.      We will plan to proceed with a Myoview for risk stratification.  If      it shows normal perfusion, we will not pursue further cardiac      workup.  2. Possible bicuspid aortic valve on previous echocardiogram.  She has      a soft murmur on examination, but there was no significant aortic      stenosis.  3.  Palpitations.  We will schedule her to have      CardioNet monitor to more fully evaluate.  3. Left carotid bruit.  We will schedule her to have carotid Dopplers.  4. Diabetes mellitus.  Per Dr. Oneta Rack.  5. Hypertension.  Blood pressure is adequately controlled.  6. Hyperlipidemia.  She will can continue on pravastatin and Dr.      Oneta Rack is following this.   I will see her back in four weeks to review the above.     Madolyn Frieze Jens Som, MD, [facility]  Electronically Signed   BSC/MedQ  DD: 06/07/2007  DT: 06/07/2007  Job #: [id number]   cc:   Lucky Cowboy, M.D.

## 2010-06-30 NOTE — Assessment & Plan Note (Signed)
[facility]                            CARDIOLOGY OFFICE NOTE   NAME:MCCAIN-LINDSEYRonnell, [name]                 MRN:          [id number]  DATE:07/14/2007                            DOB:          [birth year]    The patient is a pleasant 55 year old female that I recently saw for an  abnormal electrocardiogram and palpitations.  The patient had a Myoview  performed on Jun 16, 2007, that showed no ischemia or infarction.  The  ejection fraction was 74%.  She also had carotid Dopplers performed on  Jun 16, 2007, that showed 0% to 39% bilateral stenosis.  Finally, she had  a CardioNet that showed no significant arrhythmias.  Note, she did not  have palpitations during that time.  Since then, she denies any dyspnea  on exertion, orthopnea, PND, pedal edema, presyncope, syncope, or  exertional chest pain.  She did not have palpitations, since being  evaluated nor while wearing the monitor.   MEDICATIONS:  1. Ranitidine 300 mg p.o. daily.  2. Metformin 1000 mg p.o. b.i.d.  3. __________  10 mg p.o. t.i.d.  4. Pravachol 40 mg p.o. at bedtime.  5. Glyburide 5 mg p.o. daily.  6. Hydrochlorothiazide 25 mg p.o. daily.  7. Aspirin 81 mg p.o. daily.  8. Multivitamin.  9. Fish oil.  10.Glucosamine.  11.Calcium and vitamin D.   PHYSICAL EXAMINATION:  Blood pressure of 113/77 and pulse of 79.  She  weighs 138 pounds.  HEENT:  Normal.  NECK:  Supple.  CHEST:  Clear.  CARDIOVASCULAR:  Regular rate and rhythm.  ABDOMEN:  No tenderness.  EXTREMITIES:  No edema.   DIAGNOSES:  1. Recent abnormal electrocardiogram - Myoview is normal.  We will not      pursue further ischemia evaluation.  2. Possible bicuspid aortic valve on echocardiogram in December 2008 -      we will plan to repeat echocardiograms in the future.  3. Palpitations - her CardioNet monitor was unremarkable, and she has      had no further palpitations.  We will not pursue this further at      this  point.  4. Mild cerebrovascular disease - she will continue on her aspirin and      Pravachol.  5. Diabetes mellitus - management per Dr. Oneta Rack.  6. Hypertension - blood pressure is adequately controlled.  7. Hyperlipidemia - she will continue on her statin, and Dr. Oneta Rack      is following this.   We will see her back in 1 year.      Madolyn Frieze Jens Som, MD, [facility]  Electronically Signed    BSC/MedQ  DD: 07/14/2007  DT: 07/14/2007  Job #: [id number]   cc:   Lucky Cowboy, M.D.

## 2010-08-04 ENCOUNTER — Other Ambulatory Visit: Payer: Self-pay | Admitting: *Deleted

## 2010-08-04 MED ORDER — LISINOPRIL 10 MG PO TABS: 10.0000 mg | ORAL_TABLET | Freq: Every day | ORAL | Status: DC

## 2010-08-04 MED ORDER — POTASSIUM CHLORIDE CRYS ER 20 MEQ PO TBCR: 20.0000 meq | EXTENDED_RELEASE_TABLET | Freq: Every day | ORAL | Status: DC

## 2010-11-04 ENCOUNTER — Other Ambulatory Visit (HOSPITAL_COMMUNITY): Payer: Self-pay | Admitting: Internal Medicine

## 2010-11-04 DIAGNOSIS — Z1231 Encounter for screening mammogram for malignant neoplasm of breast: Secondary | ICD-10-CM

## 2010-11-13 ENCOUNTER — Ambulatory Visit (HOSPITAL_COMMUNITY)
Admission: RE | Admit: 2010-11-13 | Discharge: 2010-11-13 | Disposition: A | Discharge status: Home / Self Care | Payer: BC Managed Care – PPO | Source: Ambulatory Visit | Attending: Internal Medicine | Admitting: Internal Medicine

## 2010-11-13 DIAGNOSIS — Z1231 Encounter for screening mammogram for malignant neoplasm of breast: Secondary | ICD-10-CM | POA: Insufficient documentation

## 2010-11-19 ENCOUNTER — Other Ambulatory Visit: Payer: Self-pay | Admitting: Internal Medicine

## 2010-11-19 DIAGNOSIS — R928 Other abnormal and inconclusive findings on diagnostic imaging of breast: Secondary | ICD-10-CM

## 2010-12-02 ENCOUNTER — Ambulatory Visit
Admission: RE | Admit: 2010-12-02 | Discharge: 2010-12-02 | Disposition: A | Discharge status: Home / Self Care | Payer: BC Managed Care – PPO | Source: Ambulatory Visit | Attending: Internal Medicine | Admitting: Internal Medicine

## 2010-12-02 DIAGNOSIS — R928 Other abnormal and inconclusive findings on diagnostic imaging of breast: Secondary | ICD-10-CM

## 2011-02-25 ENCOUNTER — Encounter (HOSPITAL_COMMUNITY): Payer: Self-pay | Admitting: Emergency Medicine

## 2011-02-25 ENCOUNTER — Other Ambulatory Visit: Payer: Self-pay

## 2011-02-25 ENCOUNTER — Emergency Department (HOSPITAL_COMMUNITY): Payer: BC Managed Care – PPO

## 2011-02-25 ENCOUNTER — Emergency Department (HOSPITAL_COMMUNITY)
Admission: EM | Admit: 2011-02-25 | Discharge: 2011-02-25 | Disposition: A | Discharge status: Home / Self Care | Payer: BC Managed Care – PPO | Attending: Emergency Medicine | Admitting: Emergency Medicine

## 2011-02-25 DIAGNOSIS — E119 Type 2 diabetes mellitus without complications: Secondary | ICD-10-CM | POA: Insufficient documentation

## 2011-02-25 DIAGNOSIS — Z79899 Other long term (current) drug therapy: Secondary | ICD-10-CM | POA: Insufficient documentation

## 2011-02-25 DIAGNOSIS — I10 Essential (primary) hypertension: Secondary | ICD-10-CM | POA: Insufficient documentation

## 2011-02-25 DIAGNOSIS — R109 Unspecified abdominal pain: Secondary | ICD-10-CM | POA: Insufficient documentation

## 2011-02-25 DIAGNOSIS — M25519 Pain in unspecified shoulder: Secondary | ICD-10-CM | POA: Insufficient documentation

## 2011-02-25 DIAGNOSIS — R011 Cardiac murmur, unspecified: Secondary | ICD-10-CM | POA: Insufficient documentation

## 2011-02-25 DIAGNOSIS — Z7982 Long term (current) use of aspirin: Secondary | ICD-10-CM | POA: Insufficient documentation

## 2011-02-25 DIAGNOSIS — R0789 Other chest pain: Secondary | ICD-10-CM | POA: Insufficient documentation

## 2011-02-25 HISTORY — DX: Essential (primary) hypertension: I10

## 2011-02-25 LAB — CBC
HCT: 38.6 % (ref 36.0–46.0)
Hemoglobin: 12.6 g/dL (ref 12.0–15.0)
MCH: 30.7 pg (ref 26.0–34.0)
MCHC: 32.6 g/dL (ref 30.0–36.0)
MCV: 93.9 fL (ref 78.0–100.0)
Platelets: 275 10*3/uL (ref 150–400)
RBC: 4.11 MIL/uL (ref 3.87–5.11)
RDW: 13.5 % (ref 11.5–15.5)
WBC: 8.6 10*3/uL (ref 4.0–10.5)

## 2011-02-25 LAB — COMPREHENSIVE METABOLIC PANEL
ALT: 19 U/L (ref 0–35)
AST: 19 U/L (ref 0–37)
Albumin: 3.7 g/dL (ref 3.5–5.2)
Alkaline Phosphatase: 87 U/L (ref 39–117)
BUN: 16 mg/dL (ref 6–23)
CO2: 29 mEq/L (ref 19–32)
Calcium: 9 mg/dL (ref 8.4–10.5)
Chloride: 97 mEq/L (ref 96–112)
Creatinine, Ser: 0.54 mg/dL (ref 0.50–1.10)
GFR calc Af Amer: >90 mL/min (ref >90)
GFR calc non Af Amer: >90 mL/min (ref >90)
Glucose, Bld: 174 mg/dL — ABNORMAL HIGH (ref 70–99)
Potassium: 3.6 mEq/L (ref 3.5–5.1)
Sodium: 136 mEq/L (ref 135–145)
Total Bilirubin: 0.1 mg/dL — ABNORMAL LOW (ref 0.3–1.2)
Total Protein: 7.5 g/dL (ref 6.0–8.3)

## 2011-02-25 LAB — POCT I-STAT TROPONIN I: Troponin i, poc: 0 ng/mL (ref 0.00–0.08)

## 2011-02-25 NOTE — ED Provider Notes (Signed)
I saw and evaluated the patient, reviewed the resident's note and I agree with the findings and plan.  Veroncia Jezek P Qunicy Higinbotham, MD 02/25/11 1631 

## 2011-02-25 NOTE — ED Notes (Signed)
Pt staes it is a pressure and it comes and goes some nausea no cough or  Vomiting  midsternal pain no radiation

## 2011-02-25 NOTE — ED Notes (Signed)
Chest pain since last night pt here in er w/ her mother

## 2011-02-25 NOTE — ED Provider Notes (Signed)
I saw and evaluated the patient, reviewed the resident's note and I agree with the findings and plan.  56 year old, female, with insulin-dependent diabetes, and hypercholesterolemia.  He does not smoke cigarettes, presents to emergency department with intermittent chest tightness for several weeks.  The symptoms.  Last from 5 minutes to approximately an hour.  A car while she is at rest.  She does not get symptoms.  When she is ambulatory.  She denies associated nausea, vomiting, shortness breath, or diaphoresis.  With this.  She has not had leg pain or swelling.  She has not had a cough, fevers, or chills.  On examination.  She is normal with a EKG that does not show a STEMI negative cardiac enzymes.  We will release her to home with followup with her family doctor.  Nicholes Stairs, MD 02/25/11 1550

## 2011-02-25 NOTE — ED Provider Notes (Signed)
History     CSN: [id number]  Arrival date & time 02/25/11  1237   First MD Initiated Contact with Patient 02/25/11 1343      Chief Complaint  Patient presents with  . Chest Pain    (Consider location/radiation/quality/duration/timing/severity/associated sxs/prior treatment) Patient is a 56 y.o. female presenting with chest pain. The history is provided by the patient. No language interpreter was used.  Chest Pain The chest pain began yesterday. Chest pain occurs intermittently. The chest pain is unchanged. Associated with: No associated factors. At its most intense, the pain is at 7/10. The pain is currently at 0/10. The severity of the pain is moderate. The quality of the pain is described as tightness. The pain radiates to the left shoulder. Exacerbated by: No worsening factors. Primary symptoms include abdominal pain (1 week ago, none lately). Pertinent negatives for primary symptoms include no fever, no syncope, no shortness of breath, no cough, no wheezing, no palpitations, no nausea, no vomiting and no altered mental status.     Past Medical History  Diagnosis Date  . Diabetes mellitus   . Hypertension     No past surgical history on file.  No family history on file.  History  Substance Use Topics  . Smoking status: Not on file  . Smokeless tobacco: Not on file  . Alcohol Use:     OB History    Grav Para Term Preterm Abortions TAB SAB Ect Mult Living                  Review of Systems  Constitutional: Negative for fever.  Respiratory: Negative for cough, shortness of breath and wheezing.   Cardiovascular: Positive for chest pain. Negative for palpitations and syncope.  Gastrointestinal: Positive for abdominal pain (1 week ago, none lately). Negative for nausea and vomiting.  Psychiatric/Behavioral: Negative for altered mental status.  All other systems reviewed and are negative.    Allergies  Review of patient's allergies indicates no known  allergies.  Home Medications   Current Outpatient Rx  Name Route Sig Dispense Refill  . ASPIRIN EC 81 MG PO TBEC Oral Take 81 mg by mouth daily.    . SUPER B COMPLEX PO Oral Take 1 tablet by mouth daily.    [name] VITAMIN D 2000 UNITS PO CAPS Oral Take 1 capsule by mouth 2 (two) times daily.    . CYCLOSPORINE 0.05 % OP EMUL Both Eyes Place 1 drop into both eyes 2 (two) times daily.    [name] FERROUS SULFATE 325 (65 FE) MG PO TABS Oral Take 650 mg by mouth daily with breakfast.    . GLYBURIDE 5 MG PO TABS Oral Take 1.25 mg by mouth daily with breakfast.    . HYDROCHLOROTHIAZIDE 25 MG PO TABS Oral Take 25 mg by mouth daily.    [name] LISINOPRIL 10 MG PO TABS Oral Take 10 mg by mouth daily.    [name] MAGNESIUM PO Oral Take 2 tablets by mouth daily.    [name] METFORMIN HCL 1000 MG PO TABS Oral Take 1,000 mg by mouth 2 (two) times daily with a meal.    . ADULT MULTIVITAMIN W/MINERALS CH Oral Take 1 tablet by mouth daily.    . OMEGA-3-ACID ETHYL ESTERS 1 G PO CAPS Oral Take 2 g by mouth daily.    [name] PRAVASTATIN SODIUM 40 MG PO TABS Oral Take 40 mg by mouth every other day.      BP 156/66  Pulse 91  Temp 98.4 F (  36.9 C)  Resp 20  SpO2 100%  Physical Exam  Nursing note and vitals reviewed. Constitutional: She is oriented to person, place, and time. She appears well-developed and well-nourished. No distress.  HENT:  Head: Normocephalic and atraumatic.  Eyes: EOM are normal. Pupils are equal, round, and reactive to light.  Neck: Normal range of motion. Neck supple.  Cardiovascular: Normal rate and regular rhythm.  Exam reveals no friction rub.   Murmur (2/6 systolic murmur) heard. Pulmonary/Chest: Effort normal and breath sounds normal. No respiratory distress. She has no wheezes. She has no rales.  Abdominal: Soft. She exhibits no distension. There is no tenderness. There is no rebound.  Musculoskeletal: Normal range of motion. She exhibits no edema.  Neurological: She is alert and oriented to person, place,  and time.  Skin: She is not diaphoretic.    ED Course  Procedures (including critical care time)  Labs Reviewed  COMPREHENSIVE METABOLIC PANEL - Abnormal; Notable for the following:    Glucose, Bld 174 (*)    Total Bilirubin 0.1 (*)    All other components within normal limits  CBC  POCT I-STAT TROPONIN I  I-STAT TROPONIN I   Dg Chest 2 View  02/25/2011  *RADIOLOGY REPORT*  Clinical Data: Chest tightness.  CHEST - 2 VIEW  Comparison: 08/08/2009  Findings: Mild osteopenia. Midline trachea.  Normal heart size and mediastinal contours.  Suspect age advanced aortic atherosclerosis in the transverse segment. No pleural effusion or pneumothorax. Diffuse peribronchial thickening.  Clear lungs.  IMPRESSION:  1. No acute cardiopulmonary disease. 2.  Suspect age advanced transverse aortic atherosclerosis. 3. Peribronchial thickening which may relate to chronic bronchitis or smoking.  Original Report Authenticated By: Consuello Bossier, M.D.     1. Chest pain       MDM  56 year old female presents with chest tightness since yesterday. Episodes intermittent. No inciting, alleviating, exacerbating factors. History of diabetes and hypertension. Has had the pain before however never as severe. Has never had a workup for her pain. Cardiac disease in the family but none in the patient at this time. Denies nausea, vomiting, cough, dyspnea.   Afebrile vital signs are stable. Patient is a 2/6 systolic murmur. Lung sounds are clear. Equal pulses bilaterally. The patient's risk factors of diabetes, hypertension, hyperlipidemia will check troponin and other basic labs. Chest x-ray and EKG performed. Patient's labs unremarkable. Spoke with patient's primary care physician, Dr. Oneta Rack, regarding her presentation and how we do not feel is related to cardiac disease or pneumonia at this time. He stated that patient should receive a stress test as an outpatient. He agrees with plan of discharging home with close  followup. Informed the patient about the plan and she is comfortable with the plan. Discharged home in stable condition.       Elwin Mocha, MD 02/25/11 1540

## 2011-02-25 NOTE — ED Notes (Addendum)
Pt reports she developed chest pain last night described as tightness in her chest. She denies SOB,or N/V. States nothing makes the pain better or worse but pain comes in waves. Pt reports at its worst pain is 8/10.

## 2011-04-14 ENCOUNTER — Encounter: Payer: Self-pay | Admitting: Cardiovascular Disease

## 2011-04-14 ENCOUNTER — Encounter: Payer: Self-pay | Admitting: *Deleted

## 2011-04-15 ENCOUNTER — Ambulatory Visit (INDEPENDENT_AMBULATORY_CARE_PROVIDER_SITE_OTHER): Payer: BC Managed Care – PPO | Admitting: Cardiology

## 2011-04-15 ENCOUNTER — Encounter: Payer: Self-pay | Admitting: Cardiology

## 2011-04-15 DIAGNOSIS — Q231 Congenital insufficiency of aortic valve: Secondary | ICD-10-CM

## 2011-04-15 DIAGNOSIS — I679 Cerebrovascular disease, unspecified: Secondary | ICD-10-CM

## 2011-04-15 DIAGNOSIS — R002 Palpitations: Secondary | ICD-10-CM

## 2011-04-15 DIAGNOSIS — I10 Essential (primary) hypertension: Secondary | ICD-10-CM

## 2011-04-15 DIAGNOSIS — E785 Hyperlipidemia, unspecified: Secondary | ICD-10-CM

## 2011-04-15 MED ORDER — METOPROLOL SUCCINATE ER 25 MG PO TB24: 25.0000 mg | ORAL_TABLET | Freq: Every day | ORAL | Status: DC

## 2011-04-15 NOTE — Patient Instructions (Signed)
Your physician wants you to follow-up in: ONE YEAR You will receive a reminder letter in the mail two months in advance. If you don't receive a letter, please call our office to schedule the follow-up appointment.   START METOPROLOL SUCC 25 MG ONCE DAILY

## 2011-04-15 NOTE — Assessment & Plan Note (Signed)
Blood pressure elevated. Add Toprol 25 mg daily. 

## 2011-04-15 NOTE — Assessment & Plan Note (Signed)
Continue statin. Lipids and liver monitored by primary care. 

## 2011-04-15 NOTE — Progress Notes (Signed)
HPI:The patient is a pleasant female that I have seen in the past for an abnormal electrocardiogram, bicuspid aortic valve and palpitations.  The patient had a Myoview performed on Jun 16, 2007, that showed no ischemia or infarction.  The ejection fraction was 74%.  She also had carotid Dopplers performed last in March of 2012 and showed 0% to 39% bilateral stenosis.  She had a previous CardioNet that showed no significant arrhythmias.  Note, she did not have palpitations during that time. Echocardiogram  was performed in June of 2010 to reassess her history of bicuspid aortic valve. The aortic valve was not well visualized but there was no aortic insufficiency or aortic stenosis. LV function was normal. Since I last saw her in Feb of 2012 she has some dyspnea on exertion but no orthopnea or PND. Occasional mild pedal edema. No exertional chest pain. She occasionally feels like her heart rate is elevated. No sustained palpitations.  Current Outpatient Prescriptions  Medication Sig Dispense Refill  . aspirin EC 81 MG tablet Take 81 mg by mouth daily.      . B Complex-C (SUPER B COMPLEX PO) Take 1 tablet by mouth daily.      . Cholecalciferol (VITAMIN D) 2000 UNITS CAPS Take 1 capsule by mouth 2 (two) times daily.      . cycloSPORINE (RESTASIS) 0.05 % ophthalmic emulsion Place 1 drop into both eyes 2 (two) times daily.      . ferrous sulfate 325 (65 FE) MG tablet Take 650 mg by mouth daily with breakfast.      . glyBURIDE (DIABETA) 5 MG tablet 1/4 TAB PO QD      . hydrochlorothiazide (HYDRODIURIL) 25 MG tablet Take 25 mg by mouth daily.      [name] lisinopril (PRINIVIL,ZESTRIL) 10 MG tablet Take 10 mg by mouth daily.      [name] MAGNESIUM PO Take 2 tablets by mouth daily.      . metFORMIN (GLUCOPHAGE) 1000 MG tablet Take 1,000 mg by mouth 2 (two) times daily with a meal.      . Multiple Vitamin (MULITIVITAMIN WITH MINERALS) TABS Take 1 tablet by mouth daily.      [name] omega-3 acid ethyl esters (LOVAZA) 1 G  capsule Take 2 g by mouth daily.      . pravastatin (PRAVACHOL) 40 MG tablet Take 40 mg by mouth every other day.      . metoprolol succinate (TOPROL XL) 25 MG 24 hr tablet Take 1 tablet (25 mg total) by mouth daily.  90 tablet  4     Past Medical History  Diagnosis Date  . Diabetes mellitus   . Hypertension   . HYPERLIPIDEMIA   . CEREBROVASCULAR DISEASE   . BICUSPID AORTIC VALVE   . GERD   . DEGENERATIVE JOINT DISEASE   . ASTHMA     Past Surgical History  Procedure Date  . Repair of trigger finger     History   Social History  . Marital Status: Widowed    Spouse Name: N/A    Number of Children: N/A  . Years of Education: N/A   Occupational History  . Not on file.   Social History Main Topics  . Smoking status: Never Smoker   . Smokeless tobacco: Not on file  . Alcohol Use: Not on file  . Drug Use: Not on file  . Sexually Active: Not on file   Other Topics Concern  . Not on file   Social History Narrative  .  No narrative on file    ROS: no fevers or chills, productive cough, hemoptysis, dysphasia, odynophagia, melena, hematochezia, dysuria, hematuria, rash, seizure activity, orthopnea, PND,  claudication. Remaining systems are negative.  Physical Exam: Well-developed well-nourished in no acute distress.  Skin is warm and dry.  HEENT is normal.  Neck is supple. No thyromegaly.  Chest is clear to auscultation with normal expansion.  Cardiovascular exam is regular rate and rhythm. 2/6 systolic murmur left sternal border. S2 is not diminished. No diastolic murmur noted. Abdominal exam nontender or distended. No masses palpated. Extremities show no edema. neuro grossly intact  ECG 02/25/2011-sinus rhythm at a rate of 88. Axis normal. Cannot rule out prior septal infarct.

## 2011-04-15 NOTE — Assessment & Plan Note (Signed)
This was not noted on most recent echocardiogram but aortic valve was difficult to visualize. She does not sound to have significant aortic stenosis on examination. No diastolic murmur. Plan repeat echo when she returns in one year.

## 2011-04-15 NOTE — Assessment & Plan Note (Signed)
Continue aspirin and statin. Last carotid Dopplers showed 0-39% stenosis and no followup recommended.

## 2011-04-15 NOTE — Assessment & Plan Note (Signed)
She continues to have occasional palpitations. Blood pressure elevated. Add Toprol 25 mg daily.

## 2011-04-28 ENCOUNTER — Encounter: Payer: Self-pay | Admitting: Cardiology

## 2011-05-03 ENCOUNTER — Other Ambulatory Visit (HOSPITAL_COMMUNITY): Payer: Self-pay | Admitting: Internal Medicine

## 2011-05-03 DIAGNOSIS — R102 Pelvic and perineal pain: Secondary | ICD-10-CM

## 2011-05-11 ENCOUNTER — Ambulatory Visit (HOSPITAL_COMMUNITY)
Admission: RE | Admit: 2011-05-11 | Discharge: 2011-05-11 | Disposition: A | Discharge status: Home / Self Care | Payer: BC Managed Care – PPO | Source: Ambulatory Visit | Attending: Internal Medicine | Admitting: Internal Medicine

## 2011-05-11 DIAGNOSIS — Z90710 Acquired absence of both cervix and uterus: Secondary | ICD-10-CM | POA: Insufficient documentation

## 2011-05-11 DIAGNOSIS — R102 Pelvic and perineal pain: Secondary | ICD-10-CM

## 2011-05-11 DIAGNOSIS — N949 Unspecified condition associated with female genital organs and menstrual cycle: Secondary | ICD-10-CM | POA: Insufficient documentation

## 2011-05-13 ENCOUNTER — Other Ambulatory Visit: Payer: Self-pay | Admitting: Internal Medicine

## 2011-05-14 ENCOUNTER — Other Ambulatory Visit: Payer: BC Managed Care – PPO

## 2011-05-14 ENCOUNTER — Ambulatory Visit
Admission: RE | Admit: 2011-05-14 | Discharge: 2011-05-14 | Disposition: A | Discharge status: Home / Self Care | Payer: BC Managed Care – PPO | Source: Ambulatory Visit | Attending: Internal Medicine | Admitting: Internal Medicine

## 2011-05-14 MED ORDER — IOHEXOL 300 MG/ML  SOLN
100.0000 mL | Freq: Once | INTRAMUSCULAR | Status: AC | PRN
  Administered 2011-05-14: 100 mL via INTRAVENOUS

## 2011-05-20 ENCOUNTER — Other Ambulatory Visit: Payer: Self-pay

## 2011-05-20 MED ORDER — LISINOPRIL 10 MG PO TABS: 10.0000 mg | ORAL_TABLET | Freq: Every day | ORAL | Status: DC

## 2011-05-20 NOTE — Telephone Encounter (Signed)
..   Requested Prescriptions   Signed Prescriptions Disp Refills  . lisinopril (PRINIVIL,ZESTRIL) 10 MG tablet 30 tablet 11    Sig: Take 1 tablet (10 mg total) by mouth daily.    Authorizing Provider: Lewayne Bunting    Ordering User: Christella Hartigan, Dinnis Rog Judie Petit

## 2011-06-04 ENCOUNTER — Encounter: Payer: Self-pay | Admitting: Gastroenterology

## 2011-06-30 ENCOUNTER — Other Ambulatory Visit (HOSPITAL_COMMUNITY): Payer: Self-pay | Admitting: Internal Medicine

## 2011-06-30 DIAGNOSIS — Z1231 Encounter for screening mammogram for malignant neoplasm of breast: Secondary | ICD-10-CM

## 2011-07-07 ENCOUNTER — Other Ambulatory Visit: Payer: Self-pay | Admitting: Cardiology

## 2011-07-07 MED ORDER — LISINOPRIL 10 MG PO TABS: 10.0000 mg | ORAL_TABLET | Freq: Every day | ORAL | Status: DC

## 2011-11-17 ENCOUNTER — Encounter (HOSPITAL_COMMUNITY): Payer: Self-pay | Admitting: *Deleted

## 2011-11-17 ENCOUNTER — Encounter (HOSPITAL_COMMUNITY): Payer: Self-pay

## 2011-11-17 ENCOUNTER — Emergency Department (HOSPITAL_COMMUNITY): Payer: BC Managed Care – PPO

## 2011-11-17 ENCOUNTER — Emergency Department (HOSPITAL_COMMUNITY)
Admission: EM | Admit: 2011-11-17 | Discharge: 2011-11-17 | Disposition: A | Discharge status: Home / Self Care | Payer: BC Managed Care – PPO | Attending: Emergency Medicine | Admitting: Emergency Medicine

## 2011-11-17 ENCOUNTER — Emergency Department (HOSPITAL_COMMUNITY)
Admission: EM | Admit: 2011-11-17 | Discharge: 2011-11-17 | Disposition: A | Discharge status: Nursing Home (Includes ICF) | Payer: BC Managed Care – PPO | Source: Home / Self Care | Attending: Emergency Medicine | Admitting: Emergency Medicine

## 2011-11-17 ENCOUNTER — Other Ambulatory Visit: Payer: Self-pay

## 2011-11-17 DIAGNOSIS — R079 Chest pain, unspecified: Secondary | ICD-10-CM | POA: Insufficient documentation

## 2011-11-17 DIAGNOSIS — K219 Gastro-esophageal reflux disease without esophagitis: Secondary | ICD-10-CM | POA: Insufficient documentation

## 2011-11-17 DIAGNOSIS — M1990 Unspecified osteoarthritis, unspecified site: Secondary | ICD-10-CM | POA: Insufficient documentation

## 2011-11-17 DIAGNOSIS — R0602 Shortness of breath: Secondary | ICD-10-CM | POA: Insufficient documentation

## 2011-11-17 DIAGNOSIS — E785 Hyperlipidemia, unspecified: Secondary | ICD-10-CM | POA: Insufficient documentation

## 2011-11-17 DIAGNOSIS — Z8673 Personal history of transient ischemic attack (TIA), and cerebral infarction without residual deficits: Secondary | ICD-10-CM | POA: Insufficient documentation

## 2011-11-17 DIAGNOSIS — Z79899 Other long term (current) drug therapy: Secondary | ICD-10-CM | POA: Insufficient documentation

## 2011-11-17 DIAGNOSIS — E119 Type 2 diabetes mellitus without complications: Secondary | ICD-10-CM | POA: Insufficient documentation

## 2011-11-17 DIAGNOSIS — Z7982 Long term (current) use of aspirin: Secondary | ICD-10-CM | POA: Insufficient documentation

## 2011-11-17 DIAGNOSIS — I10 Essential (primary) hypertension: Secondary | ICD-10-CM | POA: Insufficient documentation

## 2011-11-17 DIAGNOSIS — J45909 Unspecified asthma, uncomplicated: Secondary | ICD-10-CM | POA: Insufficient documentation

## 2011-11-17 LAB — BASIC METABOLIC PANEL
BUN: 13 mg/dL (ref 6–23)
CO2: 28 mEq/L (ref 19–32)
Calcium: 9.9 mg/dL (ref 8.4–10.5)
Chloride: 98 mEq/L (ref 96–112)
Creatinine, Ser: 0.47 mg/dL — ABNORMAL LOW (ref 0.50–1.10)
GFR calc Af Amer: >90 mL/min (ref >90)
GFR calc non Af Amer: >90 mL/min (ref >90)
Glucose, Bld: 159 mg/dL — ABNORMAL HIGH (ref 70–99)
Potassium: 3.4 mEq/L — ABNORMAL LOW (ref 3.5–5.1)
Sodium: 139 mEq/L (ref 135–145)

## 2011-11-17 LAB — CBC WITH DIFFERENTIAL/PLATELET
Basophils Absolute: 0 10*3/uL (ref 0.0–0.1)
Basophils Relative: 0 % (ref 0–1)
Eosinophils Absolute: 0.1 10*3/uL (ref 0.0–0.7)
Eosinophils Relative: 1 % (ref 0–5)
HCT: 38.8 % (ref 36.0–46.0)
Hemoglobin: 12.7 g/dL (ref 12.0–15.0)
Lymphocytes Relative: 31 % (ref 12–46)
Lymphs Abs: 2.4 10*3/uL (ref 0.7–4.0)
MCH: 30.7 pg (ref 26.0–34.0)
MCHC: 32.7 g/dL (ref 30.0–36.0)
MCV: 93.7 fL (ref 78.0–100.0)
Monocytes Absolute: 0.5 10*3/uL (ref 0.1–1.0)
Monocytes Relative: 6 % (ref 3–12)
Neutro Abs: 4.8 10*3/uL (ref 1.7–7.7)
Neutrophils Relative %: 62 % (ref 43–77)
Platelets: 289 10*3/uL (ref 150–400)
RBC: 4.14 MIL/uL (ref 3.87–5.11)
RDW: 13.3 % (ref 11.5–15.5)
WBC: 7.8 10*3/uL (ref 4.0–10.5)

## 2011-11-17 LAB — TROPONIN I: Troponin I: <0.3 ng/mL (ref <0.30)

## 2011-11-17 MED ORDER — ASPIRIN 81 MG PO CHEW
CHEWABLE_TABLET | ORAL | Status: AC
  Filled 2011-11-17: qty 4

## 2011-11-17 MED ORDER — FAMOTIDINE 20 MG PO TABS: 20.0000 mg | ORAL_TABLET | Freq: Two times a day (BID) | ORAL | Status: DC

## 2011-11-17 MED ORDER — ASPIRIN 81 MG PO CHEW
324.0000 mg | CHEWABLE_TABLET | Freq: Once | ORAL | Status: AC
  Administered 2011-11-17: 324 mg via ORAL

## 2011-11-17 NOTE — ED Notes (Signed)
Tatyana, PA at the bedside  

## 2011-11-17 NOTE — ED Notes (Signed)
Pt went to urgent care prior to being seen here for chest tightness all across chest. Has been going on for a while but has progressively gotten worse the last few days. Stays SOB all the time. The pain mainly bothers her when she is lying down. Denies any other symptoms associated with the pain other than SOB.

## 2011-11-17 NOTE — ED Notes (Signed)
Pt   Reports    Chest  Tightness     X  2  Days      Pt  Describes the           Pain  As    Being  On a  Scale  Of  5     She  Reports        She  Has       Had  Similar   Symptoms       For  sev  Months   She  Is  A  Somewhat  Vague        Historian     She  Takes   bp  meds     And isosorbide       Pt   Was  Noted  To  Have  Sat  In  ucc   Parking  Lot  For  45  mins  Waiting  For  [place]  To  Open  She  Explains        Her  Skin is  Warm and  Dry  Her lungs  Are  Clear   She is  In no  Acute  Distress        Cap refill  Is  Brisk

## 2011-11-17 NOTE — ED Provider Notes (Signed)
Medical screening examination/treatment/procedure(s) were conducted as a shared visit with non-physician practitioner(s) and myself.  I personally evaluated the patient during the encounter  Cheri Guppy, MD 11/17/11 1614

## 2011-11-17 NOTE — ED Provider Notes (Addendum)
History     CSN: [id number]  Arrival date & time 11/17/11  0803   First MD Initiated Contact with Patient 11/17/11 [phone]      Chief Complaint  Patient presents with  . Chest Pain    (Consider location/radiation/quality/duration/timing/severity/associated sxs/prior treatment) HPI Comments: Patient presents urgent care this morning complaining of an ongoing intermittent chest pains at started months ago, recurrent describes  for the last 2-3 days she's been experiencing frequent retrosternal chest pains tightness, radiation towards the anterior aspect of her neck. Patient denies any shortness of breath, cough or fevers. Describes tightness character to the pain, she took an aspirin this morning. " I have been waiting for you to open the doors this morning for about an hour". Have felt some nausea this morning, no vomiting, no abdominal pain.  Patient is a 56 y.o. female presenting with chest pain. The history is provided by the patient.  Chest Pain The chest pain began more  than 1 month ago. Chest pain occurs intermittently. The chest pain is unchanged. At its most intense, the pain is at 6/10. The severity of the pain is moderate. The quality of the pain is described as tightness. The pain radiates to the right neck and left neck. Primary symptoms include nausea. Pertinent negatives for primary symptoms include no fever, no shortness of breath, no cough, no palpitations, no abdominal pain, no vomiting and no dizziness.  Pertinent negatives for associated symptoms include no diaphoresis, no numbness and no weakness. She tried aspirin for the symptoms.  Her past medical history is significant for bicuspid aortic valve.     Past Medical History  Diagnosis Date  . Diabetes mellitus   . Hypertension   . HYPERLIPIDEMIA   . CEREBROVASCULAR DISEASE   . BICUSPID AORTIC VALVE   . GERD   . DEGENERATIVE JOINT DISEASE   . ASTHMA     Past Surgical History  Procedure Date  . Repair of trigger  finger     Family History  Problem Relation Age of Onset  . Coronary artery disease Mother     History  Substance Use Topics  . Smoking status: Never Smoker   . Smokeless tobacco: Not on file  . Alcohol Use: Not on file    OB History    Grav Para Term Preterm Abortions TAB SAB Ect Mult Living                  Review of Systems  Constitutional: Negative for fever, chills, diaphoresis, activity change and appetite change.  Respiratory: Negative for cough and shortness of breath.   Cardiovascular: Positive for chest pain. Negative for palpitations and leg swelling.  Gastrointestinal: Positive for nausea. Negative for vomiting and abdominal pain.  Neurological: Negative for dizziness, weakness, numbness and headaches.    Allergies  Review of patient's allergies indicates no known allergies.  Home Medications   Current Outpatient Rx  Name Route Sig Dispense Refill  . ASPIRIN EC 81 MG PO TBEC Oral Take 81 mg by mouth daily.    . SUPER B COMPLEX PO Oral Take 1 tablet by mouth daily.    [name] VITAMIN D 2000 UNITS PO CAPS Oral Take 1 capsule by mouth 2 (two) times daily.    . CYCLOSPORINE 0.05 % OP EMUL Both Eyes Place 1 drop into both eyes 2 (two) times daily.    [name] FERROUS SULFATE 325 (65 FE) MG PO TABS Oral Take 650 mg by mouth daily with breakfast.    .  GLYBURIDE 5 MG PO TABS  1/4 TAB PO QD    . HYDROCHLOROTHIAZIDE 25 MG PO TABS Oral Take 25 mg by mouth daily.    [name] LISINOPRIL 10 MG PO TABS Oral Take 1 tablet (10 mg total) by mouth daily. 90 tablet 3  . MAGNESIUM PO Oral Take 2 tablets by mouth daily.    [name] METFORMIN HCL 1000 MG PO TABS Oral Take 1,000 mg by mouth 2 (two) times daily with a meal.    . METOPROLOL SUCCINATE ER 25 MG PO TB24 Oral Take 1 tablet (25 mg total) by mouth daily. 90 tablet 4  . ADULT MULTIVITAMIN W/MINERALS CH Oral Take 1 tablet by mouth daily.    . OMEGA-3-ACID ETHYL ESTERS 1 G PO CAPS Oral Take 2 g by mouth daily.    [name] PRAVASTATIN SODIUM 40 MG PO TABS  Oral Take 40 mg by mouth every other day.      BP 142/68  Pulse 82  Temp 98.6 F (37 C) (Oral)  Resp 16  SpO2 100%  Physical Exam  Nursing note and vitals reviewed. Constitutional: Vital signs are normal. She appears well-developed and well-nourished.  Non-toxic appearance. She does not have a sickly appearance. She does not appear ill.  Eyes: Pupils are equal, round, and reactive to light.  Neck: No JVD present.  Cardiovascular: Normal heart sounds.  Exam reveals no gallop and no friction rub.   No murmur heard. Pulmonary/Chest: Effort normal and breath sounds normal.  Abdominal: Soft.    ED Course  Procedures (including critical care time)  Labs Reviewed - No data to display No results found.   1. Chest pain at rest     EKG normal sinus rhythm suggestion of anteroseptal infarct as noted with q. waves on V2 and v3 no ST-T elevation or depression. No QT PR or QRS abnormalities.  MDM   Patient presents urgent care (waiting approximately one hour prior he is opening or doors), complaining of retrosternal chest pain. Patient is hemodynamically stable, positive risk factors for coronary artery disease. Abnormal EKG. Patient has been provided with aspirin and will be transferred to the emergency department in stable condition for further evaluation and and monitoring.     Jimmie Molly, MD 11/17/11 4782  Jimmie Molly, MD 11/17/11 [phone]

## 2011-11-17 NOTE — ED Provider Notes (Signed)
History     CSN: [id number]  Arrival date & time 11/17/11  4098   First MD Initiated Contact with Patient 11/17/11 [phone]      Chief Complaint  Patient presents with  . Chest Pain    (Consider location/radiation/quality/duration/timing/severity/associated sxs/prior treatment) HPI Comments: [name] is a 56 y.o. Female who presents with complaint of a chest pain. Pt states intermittent chest pains for about a month, with worsening and more frequent pain in the last few days. States pain is non exertional, random. States originally started at nigh time and with laying down, but now also gets it if she is out doing something or working. Pt states she has some shortness of breath, but states it is not severe. She denies fever, chills, cough, wheezing. States has seen cardiologist few years ago for the same, negative stress test at that time. States pain is across entire chest, radiating to the throat. Also reports burning sensation in upper left abdomen. No nausea, vomiting, problems with bowels.   The history is provided by the patient.    Past Medical History  Diagnosis Date  . Diabetes mellitus   . Hypertension   . HYPERLIPIDEMIA   . CEREBROVASCULAR DISEASE   . BICUSPID AORTIC VALVE   . GERD   . DEGENERATIVE JOINT DISEASE   . ASTHMA     Past Surgical History  Procedure Date  . Repair of trigger finger   . Abdominal hysterectomy     Family History  Problem Relation Age of Onset  . Coronary artery disease Mother     History  Substance Use Topics  . Smoking status: Never Smoker   . Smokeless tobacco: Not on file  . Alcohol Use: No    OB History    Grav Para Term Preterm Abortions TAB SAB Ect Mult Living                  Review of Systems  Constitutional: Negative for fever and chills.  HENT: Negative for neck pain and neck stiffness.   Respiratory: Positive for chest tightness and shortness of breath.   Cardiovascular: Positive for chest pain.  Negative for palpitations and leg swelling.  Gastrointestinal: Positive for abdominal pain. Negative for nausea, vomiting, diarrhea and constipation.  Genitourinary: Negative for dysuria.  Musculoskeletal: Negative.   Skin: Negative.   Neurological: Negative for dizziness, weakness and headaches.  Hematological: Negative.     Allergies  Review of patient's allergies indicates no known allergies.  Home Medications   Current Outpatient Rx  Name Route Sig Dispense Refill  . ASPIRIN EC 81 MG PO TBEC Oral Take 81 mg by mouth daily.    . SUPER B COMPLEX PO Oral Take 1 tablet by mouth daily.    [name] VITAMIN D 2000 UNITS PO CAPS Oral Take 1 capsule by mouth 2 (two) times daily.    . CYCLOSPORINE 0.05 % OP EMUL Both Eyes Place 1 drop into both eyes 2 (two) times daily.    [name] FERROUS SULFATE 325 (65 FE) MG PO TABS Oral Take 650 mg by mouth daily with breakfast.    . FLUTICASONE PROPIONATE 50 MCG/ACT NA SUSP Nasal Place 2 sprays into the nose daily as needed. For congestion    . GLYBURIDE 5 MG PO TABS Oral Take 1.25 mg by mouth 2 (two) times daily with a meal.     . HYDROCHLOROTHIAZIDE 25 MG PO TABS Oral Take 25 mg by mouth daily.    [name] LISINOPRIL 10 MG PO  TABS Oral Take 10 mg by mouth daily.    [name] MAGNESIUM PO Oral Take 2 tablets by mouth daily.    [name] METFORMIN HCL 1000 MG PO TABS Oral Take 1,000 mg by mouth 2 (two) times daily with a meal.    . METOPROLOL SUCCINATE ER 25 MG PO TB24 Oral Take 25 mg by mouth daily.    . ADULT MULTIVITAMIN W/MINERALS CH Oral Take 1 tablet by mouth daily.    . OMEGA-3-ACID ETHYL ESTERS 1 G PO CAPS Oral Take 2 g by mouth daily.    [name] POLYETHYLENE GLYCOL 3350 PO PACK Oral Take 17 g by mouth daily.    [name] PRAVASTATIN SODIUM 40 MG PO TABS Oral Take 40 mg by mouth every other day.      BP 133/63  Pulse 81  Temp 98.4 F (36.9 C) (Oral)  Resp 18  Ht 4\' 11"  (1.499 m)  Wt 143 lb (64.864 kg)  BMI 28.88 kg/m2  SpO2 99%  Physical Exam  Nursing note and vitals  reviewed. Constitutional: She appears well-developed and well-nourished. No distress.  HENT:  Head: Normocephalic.  Eyes: Conjunctivae normal are normal.  Neck: Neck supple.  Cardiovascular: Normal rate, regular rhythm and normal heart sounds.   Pulmonary/Chest: Effort normal and breath sounds normal. No respiratory distress. She has no wheezes. She has no rales.  Abdominal: Soft. Bowel sounds are normal. She exhibits no distension. There is no tenderness. There is no rebound.  Musculoskeletal: She exhibits no edema.  Neurological: She is alert.  Skin: Skin is warm and dry.  Psychiatric: She has a normal mood and affect. Her behavior is normal.    ED Course  Procedures (including critical care time)  Pt with atypical chest pain, now intermittent for 1 months, non exertional. Reports burning in left abdomen and radiation of discomfort into the throat. Hx of gerd. Will get labs, troponin, ecg, cxr to r/o cardiac source.    Date: 11/17/2011  Rate: 75  Rhythm: normal sinus rhythm  QRS Axis: normal  Intervals: normal  ST/T Wave abnormalities: nonspecific ST changes  Conduction Disutrbances:none  Narrative Interpretation: Q waves inferiorly and anteriorly  Old EKG Reviewed: unchanged   Results for orders placed during the hospital encounter of 11/17/11  CBC WITH DIFFERENTIAL      Component Value Range   WBC 7.8  4.0 - 10.5 K/uL   RBC 4.14  3.87 - 5.11 MIL/uL   Hemoglobin 12.7  12.0 - 15.0 g/dL   HCT 13.0  86.5 - 78.4 %   MCV 93.7  78.0 - 100.0 fL   MCH 30.7  26.0 - 34.0 pg   MCHC 32.7  30.0 - 36.0 g/dL   RDW 69.6  29.5 - 28.4 %   Platelets 289  150 - 400 K/uL   Neutrophils Relative 62  43 - 77 %   Neutro Abs 4.8  1.7 - 7.7 K/uL   Lymphocytes Relative 31  12 - 46 %   Lymphs Abs 2.4  0.7 - 4.0 K/uL   Monocytes Relative 6  3 - 12 %   Monocytes Absolute 0.5  0.1 - 1.0 K/uL   Eosinophils Relative 1  0 - 5 %   Eosinophils Absolute 0.1  0.0 - 0.7 K/uL   Basophils Relative 0  0 -  1 %   Basophils Absolute 0.0  0.0 - 0.1 K/uL  BASIC METABOLIC PANEL      Component Value Range   Sodium 139  135 - 145  mEq/L   Potassium 3.4 (*) 3.5 - 5.1 mEq/L   Chloride 98  96 - 112 mEq/L   CO2 28  19 - 32 mEq/L   Glucose, Bld 159 (*) 70 - 99 mg/dL   BUN 13  6 - 23 mg/dL   Creatinine, Ser 4.09 (*) 0.50 - 1.10 mg/dL   Calcium 9.9  8.4 - 81.1 mg/dL   GFR calc non Af Amer >90  >90 mL/min   GFR calc Af Amer >90  >90 mL/min  TROPONIN I      Component Value Range   Troponin I <0.30  <0.30 ng/mL   Dg Chest 2 View  11/17/2011  *RADIOLOGY REPORT*  Clinical Data: Chest pain.  CHEST - 2 VIEW  Comparison: None.  Findings: The heart size is normal.  The lungs are clear.  The visualized soft tissues and bony thorax are unremarkable.  IMPRESSION: No acute cardiopulmonary disease or significant interval change.   Original Report Authenticated By: Jamesetta Orleans. MATTERN, M.D.     12:11 PM Pt with negative troponin, negative CXR, negative labs. ECG showing new q waves but no sign of an acute MI. Pt's symptoms are atypical, worse at night, suspect GERD. Will start on pepcid, follow up with cardiology for further evaluation and possible stress test.    1. Chest pain   2. GERD (gastroesophageal reflux disease)       MDM  Pt with atypical, non exertional chest pain. She is in no distress. Troponin negative. CXR negative. ECG with no acute findings. Doubt ACS. She is perc negative.         Lottie Mussel, PA 11/17/11 1526

## 2011-11-17 NOTE — ED Provider Notes (Signed)
Medical screening examination/treatment/procedure(s) were conducted as a shared visit with non-physician practitioner(s) and myself.  I personally evaluated the patient during the encounter 51 y female with dm. C/o chest pain = "burning" radiates to neck. Worse lying down.  No n/v/sob/sweating. Denies cp with ambulation or climbing stairs.  No change with food.  PE normal.  ecg and ces normal.  Doubt acs.   More likely GERD.  Will realease  Cheri Guppy, MD 11/17/11 1213

## 2011-11-19 ENCOUNTER — Other Ambulatory Visit: Payer: Self-pay | Admitting: Internal Medicine

## 2011-11-19 ENCOUNTER — Ambulatory Visit (HOSPITAL_COMMUNITY)
Admission: RE | Admit: 2011-11-19 | Discharge: 2011-11-19 | Disposition: A | Discharge status: Home / Self Care | Payer: BC Managed Care – PPO | Source: Ambulatory Visit | Attending: Internal Medicine | Admitting: Internal Medicine

## 2011-11-19 DIAGNOSIS — Z1231 Encounter for screening mammogram for malignant neoplasm of breast: Secondary | ICD-10-CM

## 2011-11-19 DIAGNOSIS — N6310 Unspecified lump in the right breast, unspecified quadrant: Secondary | ICD-10-CM

## 2011-11-19 LAB — HM MAMMOGRAPHY

## 2011-11-25 ENCOUNTER — Other Ambulatory Visit: Payer: Self-pay | Admitting: Internal Medicine

## 2011-11-25 ENCOUNTER — Ambulatory Visit
Admission: RE | Admit: 2011-11-25 | Discharge: 2011-11-25 | Disposition: A | Discharge status: Home / Self Care | Payer: BC Managed Care – PPO | Source: Ambulatory Visit | Attending: Internal Medicine | Admitting: Internal Medicine

## 2011-11-25 DIAGNOSIS — N6310 Unspecified lump in the right breast, unspecified quadrant: Secondary | ICD-10-CM

## 2011-12-01 ENCOUNTER — Encounter: Payer: BC Managed Care – PPO | Admitting: Physician Assistant

## 2012-05-02 ENCOUNTER — Telehealth: Payer: Self-pay | Admitting: Cardiology

## 2012-05-02 NOTE — Telephone Encounter (Signed)
Pt has not taken BP/p at home, reviewed medication side effects for Nuvigil, can cause heart pounding in the ears, chest tightness and anxiety, she will call her PCP that prescribed the medication for advise. Pt has appointment this Friday 05/05/12 with Norma Fredrickson np.  Informed pt  I would update dr/nurse.

## 2012-05-02 NOTE — Telephone Encounter (Signed)
New problem    Pt is having elevated heart rate and can hear her heartbeat loud. Pt is taking  Klor-con 20mg  and Nuvigil 150mg  . Pt is sched to come in and see Lori on 05/05/12. Want Dr to know what is going on with her.

## 2012-05-05 ENCOUNTER — Ambulatory Visit (INDEPENDENT_AMBULATORY_CARE_PROVIDER_SITE_OTHER): Payer: BC Managed Care – PPO | Admitting: Nurse Practitioner

## 2012-05-05 ENCOUNTER — Encounter: Payer: Self-pay | Admitting: Nurse Practitioner

## 2012-05-05 VITALS — BP 110/62 | HR 86 | Ht 59.0 in | Wt 136.0 lb

## 2012-05-05 DIAGNOSIS — Q231 Congenital insufficiency of aortic valve: Secondary | ICD-10-CM

## 2012-05-05 LAB — BASIC METABOLIC PANEL
BUN: 23 mg/dL (ref 6–23)
CO2: 31 mEq/L (ref 19–32)
Calcium: 9.1 mg/dL (ref 8.4–10.5)
Chloride: 102 mEq/L (ref 96–112)
Creatinine, Ser: 0.7 mg/dL (ref 0.4–1.2)
GFR: 121.03 mL/min (ref >60.00)
Glucose, Bld: 145 mg/dL — ABNORMAL HIGH (ref 70–99)
Potassium: 4 mEq/L (ref 3.5–5.1)
Sodium: 141 mEq/L (ref 135–145)

## 2012-05-05 LAB — CBC WITH DIFFERENTIAL/PLATELET
Basophils Absolute: 0 10*3/uL (ref 0.0–0.1)
Basophils Relative: 0.2 % (ref 0.0–3.0)
Eosinophils Absolute: 0.1 10*3/uL (ref 0.0–0.7)
Eosinophils Relative: 1.1 % (ref 0.0–5.0)
HCT: 36 % (ref 36.0–46.0)
Hemoglobin: 11.9 g/dL — ABNORMAL LOW (ref 12.0–15.0)
Lymphocytes Relative: 28.9 % (ref 12.0–46.0)
Lymphs Abs: 2.2 10*3/uL (ref 0.7–4.0)
MCHC: 33 g/dL (ref 30.0–36.0)
MCV: 93.3 fl (ref 78.0–100.0)
Monocytes Absolute: 0.5 10*3/uL (ref 0.1–1.0)
Monocytes Relative: 7.1 % (ref 3.0–12.0)
Neutro Abs: 4.7 10*3/uL (ref 1.4–7.7)
Neutrophils Relative %: 62.7 % (ref 43.0–77.0)
Platelets: 264 10*3/uL (ref 150.0–400.0)
RBC: 3.85 Mil/uL — ABNORMAL LOW (ref 3.87–5.11)
RDW: 13.8 % (ref 11.5–14.6)
WBC: 7.6 10*3/uL (ref 4.5–10.5)

## 2012-05-05 LAB — TSH: TSH: 0.87 u[IU]/mL (ref 0.35–5.50)

## 2012-05-05 NOTE — Patient Instructions (Addendum)
We need to get an ultrasound of your heart  I would not recommend taking the Nuvigil - this makes your heart rate go faster  We are checking labs today  Regular exercise is encouraged  See Dr. Jens Som after your ultrasound so the two of you can review  Call the [facility] office at [phone] if you have any questions, problems or concerns.

## 2012-05-05 NOTE — Progress Notes (Signed)
[name] Date of Birth: 1956/02/08 Medical Record #[id number]  History of Present Illness: [name] is seen back today for a work in visit. She has been seen in the past by Dr. Jens Som. She has had an abnormal EKG, has a bicuspid AV, HLD, HTN and prior palpitations. Mild carotid stenosis from doppler in March of 2012 (0 to 39% bilaterally). Past history of negative Cardionet noted. EF has been normal. Last Myoview in May of 2009 showing no ischemia.   Last seen one year ago. BP was up. She was having palpitations. Toprol was added.   Called earlier this week. Complaining of elevated heart rate and being able to her her heart beating loud - has been on Nuvigil and potassium replacement was just restarted. It is time for her regular cardiology follow up (letter sent but she did not receive).   She comes in today. She is here alone. She is doing ok. Notes that she has an elevated heart rate on occasions - this frightens her - she has no associated symptoms with it. Not lightheaded or dizzy. No syncope. Occurs at rest. No regular exercise. 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 Has been on Nuvigil because of her shift work. Does not use caffeine. BP has been ok. Not short of breath. Some occasional swelling and she does like to use salt.    Current Outpatient Prescriptions on File Prior to Visit  Medication Sig Dispense Refill  . aspirin EC 81 MG tablet Take 81 mg by mouth daily.      . B Complex-C (SUPER B COMPLEX PO) Take 1 tablet by mouth daily.      . Cholecalciferol (VITAMIN D) 2000 UNITS CAPS Take 1 capsule by mouth 2 (two) times daily.      . cycloSPORINE (RESTASIS) 0.05 % ophthalmic emulsion Place 1 drop into both eyes 2 (two) times daily.      . ferrous sulfate 325 (65 FE) MG tablet Take 325 mg by mouth daily with breakfast.       . fluticasone (FLONASE) 50 MCG/ACT nasal spray Place 2 sprays into the nose daily as needed. For congestion      . glyBURIDE (DIABETA) 5 MG tablet Take 1.25  mg by mouth 2 (two) times daily with a meal. 1/4 pill in the am and 1/4 pill in the pm      . hydrochlorothiazide (HYDRODIURIL) 25 MG tablet Take 25 mg by mouth daily.      [name] lisinopril (PRINIVIL,ZESTRIL) 10 MG tablet Take 10 mg by mouth daily.      [name] MAGNESIUM PO Take 2 tablets by mouth daily.      . metFORMIN (GLUCOPHAGE) 1000 MG tablet Take 1,000 mg by mouth 2 (two) times daily with a meal.      . metoprolol succinate (TOPROL-XL) 25 MG 24 hr tablet Take 25 mg by mouth daily.      . Multiple Vitamin (MULITIVITAMIN WITH MINERALS) TABS Take 1 tablet by mouth daily.      [name] omega-3 acid ethyl esters (LOVAZA) 1 G capsule Take 2 g by mouth daily.      . pravastatin (PRAVACHOL) 40 MG tablet Take 40 mg by mouth every other day.       No current facility-administered medications on file prior to visit.    No Known Allergies  Past Medical History  Diagnosis Date  . Diabetes mellitus   . Hypertension   . HYPERLIPIDEMIA   . CEREBROVASCULAR DISEASE   . BICUSPID AORTIC VALVE   .  GERD   . DEGENERATIVE JOINT DISEASE   . ASTHMA   . Palpitation     Past Surgical History  Procedure Laterality Date  . Repair of trigger finger    . Abdominal hysterectomy      History  Smoking status  . Never Smoker   Smokeless tobacco  . Not on file    History  Alcohol Use No    Family History  Problem Relation Age of Onset  . Coronary artery disease Mother   . Heart disease Mother   . Diabetes Father   . Hypertension Father     Review of Systems: The review of systems is per the HPI.  All other systems were reviewed and are negative.  Physical Exam: BP 110/62  Pulse 86  Ht 4\' 11"  (1.499 m)  Wt 136 lb (61.689 kg)  BMI 27.45 kg/m2 Patient is very pleasant and in no acute distress. Skin is warm and dry. Color is normal.  HEENT is unremarkable except for an eye deformity. Normocephalic/atraumatic. PERRL. Sclera are nonicteric. Neck is supple. No masses. No JVD. Lungs are clear. Cardiac exam  shows a regular rate and rhythm. Systolic murmur noted. Abdomen is soft. Extremities are without edema. Gait and ROM are intact. No gross neurologic deficits noted.   LABORATORY DATA: Pending  Lab Results  Component Value Date   WBC 7.8 11/17/2011   HGB 12.7 11/17/2011   HCT 38.8 11/17/2011   PLT 289 11/17/2011   GLUCOSE 159* 11/17/2011   ALT 19 02/25/2011   AST 19 02/25/2011   NA 139 11/17/2011   K 3.4* 11/17/2011   CL 98 11/17/2011   CREATININE 0.47* 11/17/2011   BUN 13 11/17/2011   CO2 28 11/17/2011    Assessment / Plan: 1. Periodic elevated HR - most likely from her use of Nuvigil - I have asked her to try and not take. For now, keep her on her same dose of beta blocker therapy.   2. HTN - BP looks great. No change in her current regimen.   3. HLD - on statin therapy  4. Bicuspid aortic valve - needs her echo updated - will get this scheduled.   Will get her back to see Dr. Jens Som for her follow up. Check labs today. For now, no change in her medicines.   Patient is agreeable to this plan and will call if any problems develop in the interim.   Rosalio Macadamia, RN, ANP-C [facility] [address] [place], [place]  [address]

## 2012-05-10 ENCOUNTER — Ambulatory Visit (HOSPITAL_COMMUNITY): Payer: BC Managed Care – PPO | Attending: Cardiology | Admitting: Radiology

## 2012-05-10 DIAGNOSIS — I359 Nonrheumatic aortic valve disorder, unspecified: Secondary | ICD-10-CM

## 2012-05-10 DIAGNOSIS — E785 Hyperlipidemia, unspecified: Secondary | ICD-10-CM | POA: Insufficient documentation

## 2012-05-10 DIAGNOSIS — E119 Type 2 diabetes mellitus without complications: Secondary | ICD-10-CM | POA: Insufficient documentation

## 2012-05-10 DIAGNOSIS — I10 Essential (primary) hypertension: Secondary | ICD-10-CM | POA: Insufficient documentation

## 2012-05-10 DIAGNOSIS — R002 Palpitations: Secondary | ICD-10-CM | POA: Insufficient documentation

## 2012-05-10 DIAGNOSIS — Q231 Congenital insufficiency of aortic valve: Secondary | ICD-10-CM | POA: Insufficient documentation

## 2012-05-10 NOTE — Progress Notes (Signed)
Echocardiogram performed.  

## 2012-06-07 ENCOUNTER — Other Ambulatory Visit: Payer: Self-pay | Admitting: *Deleted

## 2012-06-07 MED ORDER — METOPROLOL SUCCINATE ER 25 MG PO TB24: 25.0000 mg | ORAL_TABLET | Freq: Every day | ORAL | Status: DC

## 2012-06-16 ENCOUNTER — Ambulatory Visit (INDEPENDENT_AMBULATORY_CARE_PROVIDER_SITE_OTHER): Payer: BC Managed Care – PPO | Admitting: Cardiology

## 2012-06-16 ENCOUNTER — Encounter: Payer: Self-pay | Admitting: Cardiology

## 2012-06-16 VITALS — BP 144/77 | HR 82

## 2012-06-16 DIAGNOSIS — Q231 Congenital insufficiency of aortic valve: Secondary | ICD-10-CM

## 2012-06-16 DIAGNOSIS — R002 Palpitations: Secondary | ICD-10-CM

## 2012-06-16 DIAGNOSIS — E785 Hyperlipidemia, unspecified: Secondary | ICD-10-CM

## 2012-06-16 DIAGNOSIS — I679 Cerebrovascular disease, unspecified: Secondary | ICD-10-CM

## 2012-06-16 DIAGNOSIS — I10 Essential (primary) hypertension: Secondary | ICD-10-CM

## 2012-06-16 DIAGNOSIS — I359 Nonrheumatic aortic valve disorder, unspecified: Secondary | ICD-10-CM

## 2012-06-16 NOTE — Assessment & Plan Note (Signed)
Continue statin. 

## 2012-06-16 NOTE — Assessment & Plan Note (Signed)
Continue present blood pressure medications. 

## 2012-06-16 NOTE — Progress Notes (Signed)
HPI: The patient is a pleasant female that I have seen in the past for an abnormal electrocardiogram, bicuspid aortic valve and palpitations. The patient had a Myoview performed on Jun 16, 2007, that showed no ischemia or infarction. The ejection fraction was 74%. She also had carotid Dopplers performed last in March of 2012 and showed 0% to 39% bilateral stenosis. She had a previous CardioNet that showed no significant arrhythmias. Note, she did not have palpitations during that time. Echocardiogram was performed in March of 2014 and showed normal LV function, no AS or AI, cannot exclude bicuspid valve. Hgb in March of 2014 11.9. TSH .87. Seen recently for elevated HR and Nuvigil DCed. Since then, she notes some dyspnea on exertion but no chest pain. Occasional elevated heart rate but improved. Occasional mild pedal edema. No syncope.  Current Outpatient Prescriptions  Medication Sig Dispense Refill  . Armodafinil (NUVIGIL) 150 MG tablet Take 150 mg by mouth as needed.      [name] aspirin EC 81 MG tablet Take 81 mg by mouth daily.      . B Complex-C (SUPER B COMPLEX PO) Take 1 tablet by mouth daily.      . Cholecalciferol (VITAMIN D) 2000 UNITS CAPS Take 1 capsule by mouth 2 (two) times daily.      . cycloSPORINE (RESTASIS) 0.05 % ophthalmic emulsion Place 1 drop into both eyes 2 (two) times daily.      . ferrous sulfate 325 (65 FE) MG tablet Take 325 mg by mouth daily with breakfast.       . FIBER FORMULA PO Take by mouth daily.      . fluticasone (FLONASE) 50 MCG/ACT nasal spray Place 2 sprays into the nose daily as needed. For congestion      . KLOR-CON M20 20 MEQ tablet Take 20 mEq by mouth 2 (two) times daily.       [name] lisinopril (PRINIVIL,ZESTRIL) 10 MG tablet Take 10 mg by mouth daily.      [name] lisinopril-hydrochlorothiazide (PRINZIDE,ZESTORETIC) 10-12.5 MG per tablet Take 1 tablet by mouth daily.      [name] MAGNESIUM PO Take 2 tablets by mouth daily.      . meloxicam (MOBIC) 15 MG tablet Take 15 mg  by mouth as needed for pain.      . metFORMIN (GLUCOPHAGE) 1000 MG tablet Take 1,000 mg by mouth 2 (two) times daily with a meal.      . metoprolol succinate (TOPROL-XL) 25 MG 24 hr tablet Take 1 tablet (25 mg total) by mouth daily.  90 tablet  3  . Multiple Vitamin (MULITIVITAMIN WITH MINERALS) TABS Take 1 tablet by mouth daily.      [name] omega-3 acid ethyl esters (LOVAZA) 1 G capsule Take 2 g by mouth daily.      . pravastatin (PRAVACHOL) 40 MG tablet Take 40 mg by mouth every other day.      . Ranitidine HCl (RANITIDINE 75 PO) Take 75 mg by mouth as needed.      . traMADol (ULTRAM) 50 MG tablet Take 50 mg by mouth as needed for pain.       No current facility-administered medications for this visit.     Past Medical History  Diagnosis Date  . Diabetes mellitus   . Hypertension   . HYPERLIPIDEMIA   . CEREBROVASCULAR DISEASE   . BICUSPID AORTIC VALVE   . GERD   . DEGENERATIVE JOINT DISEASE   . ASTHMA   . Palpitation  Past Surgical History  Procedure Laterality Date  . Repair of trigger finger    . Abdominal hysterectomy      History   Social History  . Marital Status: Widowed    Spouse Name: N/A    Number of Children: N/A  . Years of Education: N/A   Occupational History  . Not on file.   Social History Main Topics  . Smoking status: Never Smoker   . Smokeless tobacco: Not on file  . Alcohol Use: No  . Drug Use: No  . Sexually Active: Not Currently   Other Topics Concern  . Not on file   Social History Narrative  . No narrative on file    ROS: no fevers or chills, productive cough, hemoptysis, dysphasia, odynophagia, melena, hematochezia, dysuria, hematuria, rash, seizure activity, orthopnea, PND, pedal edema, claudication. Remaining systems are negative.  Physical Exam: Well-developed well-nourished in no acute distress.  Skin is warm and dry.  HEENT eye deviation Neck is supple.  Chest is clear to auscultation with normal expansion.  Cardiovascular  exam is regular rate and rhythm. 2/6 systolic murmur Abdominal exam nontender or distended. No masses palpated. Extremities show no edema. neuro grossly intact  ECG normal sinus rhythm at a rate of 82. Cannot rule out prior inferior infarct. Prior septal infarct. No ST changes.

## 2012-06-16 NOTE — Patient Instructions (Addendum)
Your physician wants you to follow-up in: 6 MONTHS WITH DR CRENSHAW You will receive a reminder letter in the mail two months in advance. If you don't receive a letter, please call our office to schedule the follow-up appointment.  

## 2012-06-16 NOTE — Assessment & Plan Note (Signed)
Continue aspirin and statin. 

## 2012-06-16 NOTE — Assessment & Plan Note (Signed)
Plan follow-up echocardiogram in the future. 

## 2012-06-16 NOTE — Assessment & Plan Note (Signed)
Symptoms have improved but she still has occasional mild elevation in heart rate. She recently began taking her Toprol daily. If her symptoms worsen I will increase his dose. We could also consider repeating her monitor. We'll see her back in 6 months to ensure she is stable.

## 2012-11-14 ENCOUNTER — Encounter (HOSPITAL_COMMUNITY): Payer: Self-pay | Admitting: Adult Health

## 2012-11-14 ENCOUNTER — Emergency Department (HOSPITAL_COMMUNITY)
Admission: EM | Admit: 2012-11-14 | Discharge: 2012-11-15 | Disposition: A | Discharge status: Home / Self Care | Payer: BC Managed Care – PPO | Attending: Emergency Medicine | Admitting: Emergency Medicine

## 2012-11-14 DIAGNOSIS — Z8719 Personal history of other diseases of the digestive system: Secondary | ICD-10-CM | POA: Insufficient documentation

## 2012-11-14 DIAGNOSIS — R109 Unspecified abdominal pain: Secondary | ICD-10-CM

## 2012-11-14 DIAGNOSIS — Z7982 Long term (current) use of aspirin: Secondary | ICD-10-CM | POA: Insufficient documentation

## 2012-11-14 DIAGNOSIS — Z8739 Personal history of other diseases of the musculoskeletal system and connective tissue: Secondary | ICD-10-CM | POA: Insufficient documentation

## 2012-11-14 DIAGNOSIS — J45909 Unspecified asthma, uncomplicated: Secondary | ICD-10-CM | POA: Insufficient documentation

## 2012-11-14 DIAGNOSIS — R1031 Right lower quadrant pain: Secondary | ICD-10-CM | POA: Insufficient documentation

## 2012-11-14 DIAGNOSIS — I10 Essential (primary) hypertension: Secondary | ICD-10-CM | POA: Insufficient documentation

## 2012-11-14 DIAGNOSIS — Z8679 Personal history of other diseases of the circulatory system: Secondary | ICD-10-CM | POA: Insufficient documentation

## 2012-11-14 DIAGNOSIS — E119 Type 2 diabetes mellitus without complications: Secondary | ICD-10-CM | POA: Insufficient documentation

## 2012-11-14 DIAGNOSIS — E785 Hyperlipidemia, unspecified: Secondary | ICD-10-CM | POA: Insufficient documentation

## 2012-11-14 DIAGNOSIS — Z79899 Other long term (current) drug therapy: Secondary | ICD-10-CM | POA: Insufficient documentation

## 2012-11-14 LAB — COMPREHENSIVE METABOLIC PANEL
ALT: 17 U/L (ref 0–35)
AST: 16 U/L (ref 0–37)
Albumin: 3.7 g/dL (ref 3.5–5.2)
Alkaline Phosphatase: 73 U/L (ref 39–117)
BUN: 16 mg/dL (ref 6–23)
CO2: 24 mEq/L (ref 19–32)
Calcium: 9.2 mg/dL (ref 8.4–10.5)
Chloride: 101 mEq/L (ref 96–112)
Creatinine, Ser: 0.41 mg/dL — ABNORMAL LOW (ref 0.50–1.10)
GFR calc Af Amer: >90 mL/min (ref >90)
GFR calc non Af Amer: >90 mL/min (ref >90)
Glucose, Bld: 113 mg/dL — ABNORMAL HIGH (ref 70–99)
Potassium: 3.7 mEq/L (ref 3.5–5.1)
Sodium: 138 mEq/L (ref 135–145)
Total Bilirubin: 0.1 mg/dL — ABNORMAL LOW (ref 0.3–1.2)
Total Protein: 7.2 g/dL (ref 6.0–8.3)

## 2012-11-14 LAB — CBC WITH DIFFERENTIAL/PLATELET
Basophils Absolute: 0 10*3/uL (ref 0.0–0.1)
Basophils Relative: 0 % (ref 0–1)
Eosinophils Absolute: 0.1 10*3/uL (ref 0.0–0.7)
Eosinophils Relative: 1 % (ref 0–5)
HCT: 38.9 % (ref 36.0–46.0)
Hemoglobin: 13.2 g/dL (ref 12.0–15.0)
Lymphocytes Relative: 29 % (ref 12–46)
Lymphs Abs: 2.1 10*3/uL (ref 0.7–4.0)
MCH: 31.8 pg (ref 26.0–34.0)
MCHC: 33.9 g/dL (ref 30.0–36.0)
MCV: 93.7 fL (ref 78.0–100.0)
Monocytes Absolute: 0.5 10*3/uL (ref 0.1–1.0)
Monocytes Relative: 7 % (ref 3–12)
Neutro Abs: 4.6 10*3/uL (ref 1.7–7.7)
Neutrophils Relative %: 63 % (ref 43–77)
Platelets: 250 10*3/uL (ref 150–400)
RBC: 4.15 MIL/uL (ref 3.87–5.11)
RDW: 13.2 % (ref 11.5–15.5)
WBC: 7.3 10*3/uL (ref 4.0–10.5)

## 2012-11-14 LAB — URINALYSIS, ROUTINE W REFLEX MICROSCOPIC
Bilirubin Urine: NEGATIVE
Glucose, UA: NEGATIVE mg/dL
Hgb urine dipstick: NEGATIVE
Ketones, ur: NEGATIVE mg/dL
Nitrite: NEGATIVE
Protein, ur: NEGATIVE mg/dL
Specific Gravity, Urine: 1.019 (ref 1.005–1.030)
Urobilinogen, UA: 0.2 mg/dL (ref 0.0–1.0)
pH: 8 (ref 5.0–8.0)

## 2012-11-14 LAB — URINE MICROSCOPIC-ADD ON

## 2012-11-14 MED ORDER — ACETAMINOPHEN 325 MG PO TABS
650.0000 mg | ORAL_TABLET | Freq: Once | ORAL | Status: AC
  Administered 2012-11-14: 650 mg via ORAL
  Filled 2012-11-14: qty 2

## 2012-11-14 NOTE — ED Notes (Signed)
Presents with lower right side abdominal pain that began suddenly at 5 pm tonight, denies nausea and diarrhea. Pain is described as "it doubled me over and I could hardly walk" pain has eased off a little, it is constant but intermittently worsens.  Pain does not radiate.

## 2012-11-14 NOTE — ED Provider Notes (Signed)
CSN: [id number]     Arrival date & time 11/14/12  2054 History   First MD Initiated Contact with Patient 11/14/12 2311     Chief Complaint  Patient presents with  . Abdominal Pain   (Consider location/radiation/quality/duration/timing/severity/associated sxs/prior Treatment) HPI Comments: [name] is a 57 y.o. Female who presents for evaluation of an episode of abdominal pain. The pain started at 5 PM tonight suddenly. It was sharp in nature and located in the right lower quadrant. The pain completely resolved after 2 hours. There were no associated symptoms. She denies nausea, vomiting, fever, chills, cough, shortness of breath, chest pain, back, pain, trouble walking, or weakness. She denies dysuria, hematuria, or urinary frequency. She has never had this previously. She is using her usual medications without relief. There are no known modifying factors   Patient is a 57 y.o. female presenting with abdominal pain. The history is provided by the patient.  Abdominal Pain   Past Medical History  Diagnosis Date  . Diabetes mellitus   . Hypertension   . HYPERLIPIDEMIA   . CEREBROVASCULAR DISEASE   . BICUSPID AORTIC VALVE   . GERD   . DEGENERATIVE JOINT DISEASE   . ASTHMA   . Palpitation    Past Surgical History  Procedure Laterality Date  . Repair of trigger finger    . Abdominal hysterectomy     Family History  Problem Relation Age of Onset  . Coronary artery disease Mother   . Heart disease Mother   . Diabetes Father   . Hypertension Father    History  Substance Use Topics  . Smoking status: Never Smoker   . Smokeless tobacco: Not on file  . Alcohol Use: No   OB History   Grav Para Term Preterm Abortions TAB SAB Ect Mult Living                 Review of Systems  Gastrointestinal: Positive for abdominal pain.  All other systems reviewed and are negative.    Allergies  Review of patient's allergies indicates no known allergies.  Home Medications    Current Outpatient Rx  Name  Route  Sig  Dispense  Refill  . Armodafinil (NUVIGIL) 150 MG tablet   Oral   Take 150 mg by mouth as needed (when working).          [name] aspirin EC 81 MG tablet   Oral   Take 81 mg by mouth daily.         . B Complex-C (SUPER B COMPLEX PO)   Oral   Take 1 tablet by mouth daily.         . Cholecalciferol (VITAMIN D) 2000 UNITS CAPS   Oral   Take 1 capsule by mouth 2 (two) times daily.         . cycloSPORINE (RESTASIS) 0.05 % ophthalmic emulsion   Both Eyes   Place 1 drop into both eyes daily.          . ferrous sulfate 325 (65 FE) MG tablet   Oral   Take 325 mg by mouth daily with breakfast.          . FIBER FORMULA PO   Oral   Take 1 tablet by mouth daily.          . fluticasone (FLONASE) 50 MCG/ACT nasal spray   Nasal   Place 2 sprays into the nose daily as needed. For congestion         .  KLOR-CON M20 20 MEQ tablet   Oral   Take 20 mEq by mouth 2 (two) times daily.          [name] lisinopril-hydrochlorothiazide (PRINZIDE,ZESTORETIC) 10-12.5 MG per tablet   Oral   Take 1 tablet by mouth daily.         [name] MAGNESIUM PO   Oral   Take 2 tablets by mouth daily.         . meloxicam (MOBIC) 15 MG tablet   Oral   Take 15 mg by mouth as needed for pain.         . metFORMIN (GLUCOPHAGE) 1000 MG tablet   Oral   Take 1,000 mg by mouth 2 (two) times daily with a meal.         . metoprolol succinate (TOPROL-XL) 25 MG 24 hr tablet   Oral   Take 1 tablet (25 mg total) by mouth daily.   90 tablet   3   . Multiple Vitamin (MULITIVITAMIN WITH MINERALS) TABS   Oral   Take 1 tablet by mouth daily.         [name] omega-3 acid ethyl esters (LOVAZA) 1 G capsule   Oral   Take 2 g by mouth daily.         . pravastatin (PRAVACHOL) 40 MG tablet   Oral   Take 40 mg by mouth every other day.         . ranitidine (ZANTAC) 300 MG tablet   Oral   Take 300 mg by mouth 2 (two) times daily.         . traMADol (ULTRAM) 50  MG tablet   Oral   Take 50 mg by mouth as needed for pain.          BP 122/68  Pulse 60  Temp(Src) 98 F (36.7 C) (Oral)  Resp 16  SpO2 98% Physical Exam  Nursing note and vitals reviewed. Constitutional: She is oriented to person, place, and time. She appears well-developed and well-nourished.  HENT:  Head: Normocephalic and atraumatic.  Eyes: Conjunctivae and EOM are normal. Pupils are equal, round, and reactive to light.  Neck: Normal range of motion and phonation normal. Neck supple.  Cardiovascular: Normal rate, regular rhythm and intact distal pulses.   Pulmonary/Chest: Effort normal and breath sounds normal. She exhibits no tenderness.  Abdominal: Soft. She exhibits no distension. There is no tenderness. There is no guarding.  Musculoskeletal: Normal range of motion.  Neurological: She is alert and oriented to person, place, and time. She exhibits normal muscle tone.  Skin: Skin is warm and dry.  Psychiatric: She has a normal mood and affect. Her behavior is normal. Judgment and thought content normal.    ED Course  Procedures (including critical care time)  Medications  acetaminophen (TYLENOL) tablet 650 mg (not administered)     Labs Review Labs Reviewed  URINALYSIS, ROUTINE W REFLEX MICROSCOPIC - Abnormal; Notable for the following:    APPearance CLOUDY (*)    Leukocytes, UA MODERATE (*)    All other components within normal limits  COMPREHENSIVE METABOLIC PANEL - Abnormal; Notable for the following:    Glucose, Bld 113 (*)    Creatinine, Ser 0.41 (*)    Total Bilirubin 0.1 (*)    All other components within normal limits  URINE MICROSCOPIC-ADD ON - Abnormal; Notable for the following:    Squamous Epithelial / LPF FEW (*)    All other components within normal limits  CBC WITH DIFFERENTIAL  MDM   1. Abdominal pain, acute    Episode of abdominal pain completely resolved now. This is unlikely to represent appendicitis, colitis, urinary tract  infection urolithiasis or occult infection. She spontaneously improved. I doubt abdominal vascular disorder, because of age, and spontaneous improvement. She is stable for discharge with symptomatic treatment.  Nursing Notes Reviewed/ Care Coordinated, and agree without changes. Applicable Imaging Reviewed.  Interpretation of Laboratory Data incorporated into ED treatment   Plan: Home Medications- usual plus Tylenol; Home Treatments and Observation- Rest, Fluids; return here if the recommended treatment, does not improve the symptoms; Recommended follow up- PCP prn      Flint Melter, MD 11/14/12 2340

## 2012-11-14 NOTE — ED Notes (Signed)
Pt has ride home.

## 2012-12-07 ENCOUNTER — Other Ambulatory Visit (HOSPITAL_COMMUNITY): Payer: Self-pay | Admitting: Emergency Medicine

## 2012-12-07 ENCOUNTER — Ambulatory Visit (HOSPITAL_COMMUNITY)
Admission: RE | Admit: 2012-12-07 | Discharge: 2012-12-07 | Disposition: A | Discharge status: Home / Self Care | Payer: BC Managed Care – PPO | Source: Ambulatory Visit | Attending: Emergency Medicine | Admitting: Emergency Medicine

## 2012-12-07 DIAGNOSIS — R209 Unspecified disturbances of skin sensation: Secondary | ICD-10-CM | POA: Insufficient documentation

## 2012-12-07 DIAGNOSIS — M479 Spondylosis, unspecified: Secondary | ICD-10-CM | POA: Insufficient documentation

## 2012-12-07 DIAGNOSIS — R202 Paresthesia of skin: Secondary | ICD-10-CM

## 2012-12-19 ENCOUNTER — Telehealth: Payer: Self-pay | Admitting: Internal Medicine

## 2012-12-19 MED ORDER — RANITIDINE HCL 300 MG PO TABS: 300.0000 mg | ORAL_TABLET | Freq: Every day | ORAL | Status: DC

## 2012-12-19 NOTE — Telephone Encounter (Signed)
PT WANTS REFILL ON RANITIDINE 300MG   90 DAYS SUPPLY RX # [id number]

## 2013-01-31 ENCOUNTER — Encounter: Payer: Self-pay | Admitting: Internal Medicine

## 2013-02-01 ENCOUNTER — Ambulatory Visit (INDEPENDENT_AMBULATORY_CARE_PROVIDER_SITE_OTHER): Payer: BC Managed Care – PPO | Admitting: Internal Medicine

## 2013-02-01 ENCOUNTER — Encounter: Payer: Self-pay | Admitting: Internal Medicine

## 2013-02-01 VITALS — BP 124/76 | HR 76 | Temp 98.1°F | Resp 16 | Wt 133.0 lb

## 2013-02-01 DIAGNOSIS — E1169 Type 2 diabetes mellitus with other specified complication: Secondary | ICD-10-CM | POA: Insufficient documentation

## 2013-02-01 DIAGNOSIS — I10 Essential (primary) hypertension: Secondary | ICD-10-CM

## 2013-02-01 DIAGNOSIS — E119 Type 2 diabetes mellitus without complications: Secondary | ICD-10-CM

## 2013-02-01 DIAGNOSIS — E782 Mixed hyperlipidemia: Secondary | ICD-10-CM

## 2013-02-01 DIAGNOSIS — Z79899 Other long term (current) drug therapy: Secondary | ICD-10-CM

## 2013-02-01 DIAGNOSIS — E559 Vitamin D deficiency, unspecified: Secondary | ICD-10-CM

## 2013-02-01 LAB — CBC WITH DIFFERENTIAL/PLATELET
Basophils Absolute: 0 10*3/uL (ref 0.0–0.1)
Basophils Relative: 0 % (ref 0–1)
Eosinophils Absolute: 0.3 10*3/uL (ref 0.0–0.7)
Eosinophils Relative: 4 % (ref 0–5)
HCT: 39.7 % (ref 36.0–46.0)
Hemoglobin: 13.1 g/dL (ref 12.0–15.0)
Lymphocytes Relative: 32 % (ref 12–46)
Lymphs Abs: 2.5 10*3/uL (ref 0.7–4.0)
MCH: 31.3 pg (ref 26.0–34.0)
MCHC: 33 g/dL (ref 30.0–36.0)
MCV: 95 fL (ref 78.0–100.0)
Monocytes Absolute: 0.5 10*3/uL (ref 0.1–1.0)
Monocytes Relative: 6 % (ref 3–12)
Neutro Abs: 4.4 10*3/uL (ref 1.7–7.7)
Neutrophils Relative %: 58 % (ref 43–77)
Platelets: 311 10*3/uL (ref 150–400)
RBC: 4.18 MIL/uL (ref 3.87–5.11)
RDW: 13.4 % (ref 11.5–15.5)
WBC: 7.6 10*3/uL (ref 4.0–10.5)

## 2013-02-01 LAB — TSH: TSH: 1.397 u[IU]/mL (ref 0.350–4.500)

## 2013-02-01 LAB — HEPATIC FUNCTION PANEL
ALT: 13 U/L (ref 0–35)
AST: 14 U/L (ref 0–37)
Albumin: 4.3 g/dL (ref 3.5–5.2)
Alkaline Phosphatase: 82 U/L (ref 39–117)
Bilirubin, Direct: <0.1 mg/dL (ref 0.0–0.3)
Total Bilirubin: 0.2 mg/dL — ABNORMAL LOW (ref 0.3–1.2)
Total Protein: 7.1 g/dL (ref 6.0–8.3)

## 2013-02-01 LAB — BASIC METABOLIC PANEL WITH GFR
BUN: 17 mg/dL (ref 6–23)
CO2: 33 mEq/L — ABNORMAL HIGH (ref 19–32)
Calcium: 9.4 mg/dL (ref 8.4–10.5)
Chloride: 101 mEq/L (ref 96–112)
Creat: 0.53 mg/dL (ref 0.50–1.10)
GFR, Est African American: >89 mL/min
GFR, Est Non African American: >89 mL/min
Glucose, Bld: 106 mg/dL — ABNORMAL HIGH (ref 70–99)
Potassium: 3.9 mEq/L (ref 3.5–5.3)
Sodium: 143 mEq/L (ref 135–145)

## 2013-02-01 LAB — LIPID PANEL
Cholesterol: 147 mg/dL (ref 0–200)
HDL: 46 mg/dL (ref >39)
LDL Cholesterol: 77 mg/dL (ref 0–99)
Total CHOL/HDL Ratio: 3.2 Ratio
Triglycerides: 121 mg/dL (ref <150)
VLDL: 24 mg/dL (ref 0–40)

## 2013-02-01 LAB — HEMOGLOBIN A1C
Hgb A1c MFr Bld: 6.7 % — ABNORMAL HIGH (ref <5.7)
Mean Plasma Glucose: 146 mg/dL — ABNORMAL HIGH (ref <117)

## 2013-02-01 LAB — MAGNESIUM: Magnesium: 1.7 mg/dL (ref 1.5–2.5)

## 2013-02-01 MED ORDER — FISH OIL 1000 MG PO CAPS: 1000.0000 mg | ORAL_CAPSULE | Freq: Every day | ORAL | Status: DC

## 2013-02-01 NOTE — Progress Notes (Signed)
Patient ID: [name], female   DOB: 1955/11/05, 57 y.o.   MRN: [id number]   This very nice 57 yo WBF presents for 3 month follow up with Hypertension (2003), Hyperlipidemia, T2 Diabetes (1996) and Vitamin D Deficiency.    BP has been controlled at home. Today's BP is 124/76. Patient denies any cardiac type chest pain, palpitations, dyspnea/orthopnea/PND, dizziness, claudication, or dependent edema.   Hyperlipidemia is controlled with diet & meds. Last Cholesterol was  149, Triglycerides were 63, HDL 51 and LDL 88 in September. Patient denies myalgias or other med SE's.    Also, the patient has history of T2 Diabetes with last A1c of 7.1% in September . Patient denies any symptoms of reactive hypoglycemia, diabetic polys, paresthesias or visual blurring.   Further, Patient has history of Vitamin D Deficiency with last vitamin D of 80 in September (was 30 in 2008). Patient supplements vitamin D without any suspected side-effects.  Medication Sig Dispense Refill  . Armodafinil (NUVIGIL) 150 MG tablet Take 150 mg by mouth as needed (when working).       [name] aspirin EC 81 MG tablet Take 81 mg by mouth daily.      . B Complex-C (SUPER B COMPLEX PO) Take 1 tablet by mouth daily.      . Cholecalciferol (VITAMIN D) 2000 UNITS CAPS Take 1 capsule by mouth 2 (two) times daily.      . cycloSPORINE (RESTASIS) 0.05 % ophthalmic emulsion Place 1 drop into both eyes daily.       . ferrous sulfate 325 (65 FE) MG tablet Take 325 mg by mouth daily with breakfast.       . FIBER FORMULA PO Take 1 tablet by mouth daily.       . fluticasone (FLONASE) 50 MCG/ACT nasal spray Place 2 sprays into the nose daily as needed. For congestion      . KLOR-CON M20 20 MEQ tablet Take 20 mEq by mouth 2 (two) times daily.       [name] lisinopril-hydrochlorothiazide (PRINZIDE,ZESTORETIC) 10-12.5 MG per tablet Take 1 tablet by mouth daily.      [name] MAGNESIUM PO Take 2 tablets by mouth daily.      . meloxicam (MOBIC) 15 MG tablet  Take 15 mg by mouth as needed for pain.      . metFORMIN (GLUCOPHAGE) 1000 MG tablet Take 1,000 mg by mouth 2 (two) times daily with a meal.      . metoprolol succinate (TOPROL-XL) 25 MG 24 hr tablet Take 1 tablet (25 mg total) by mouth daily.  90 tablet  3  . Multiple Vitamin (MULITIVITAMIN WITH MINERALS) TABS Take 1 tablet by mouth daily.      [name] omega-3 acid ethyl esters (LOVAZA) 1 G capsule Take 2 g by mouth daily.      . pravastatin (PRAVACHOL) 40 MG tablet Take 40 mg by mouth every other day.      . ranitidine (ZANTAC) 300 MG tablet Take 1 tablet (300 mg total) by mouth daily.  90 tablet  0  . traMADol (ULTRAM) 50 MG tablet Take 50 mg by mouth as needed for pain.       Allergies  Allergen Reactions  . Naprosyn [Naproxen] Other (See Comments)    GI upset    PMHx:   Past Medical History  Diagnosis Date  . Diabetes mellitus   . Hypertension   . HYPERLIPIDEMIA   . CEREBROVASCULAR DISEASE   . BICUSPID AORTIC VALVE   . GERD   .  DEGENERATIVE JOINT DISEASE   . ASTHMA   . Palpitation   . Vitamin D deficiency   . HSV-1 (herpes simplex virus 1) infection   . DJD (degenerative joint disease)   . Allergic rhinitis     FHx:    Reviewed / unchanged  SHx:    Reviewed / unchanged  Systems Review: Constitutional: Denies fever, chills, wt changes, headaches, insomnia, fatigue, night sweats, change in appetite. Eyes: Denies redness, blurred vision, diplopia, discharge, itchy, watery eyes.  ENT: Denies discharge, congestion, post nasal drip, epistaxis, sore throat, earache, hearing loss, dental pain, tinnitus, vertigo, sinus pain, snoring.  CV: Denies chest pain, palpitations, irregular heartbeat, syncope, dyspnea, diaphoresis, orthopnea, PND, claudication, edema. Respiratory: denies cough, dyspnea, DOE, pleurisy, hoarseness, laryngitis, wheezing.  Gastrointestinal: Denies dysphagia, odynophagia, heartburn, reflux, water brash, abdominal pain or cramps, nausea, vomiting, bloating,  diarrhea, constipation, hematemesis, melena, hematochezia,  or hemorrhoids. Genitourinary: Denies dysuria, frequency, urgency, nocturia, hesitancy, discharge, hematuria, flank pain. Musculoskeletal: Denies arthralgias, myalgias, stiffness, jt. swelling, pain, limp, strain/sprain.  Skin: Denies pruritus, rash, hives, warts, acne, eczema, change in skin lesion(s). Neuro: No weakness, tremor, incoordination, spasms, paresthesia, or pain. Psychiatric: Denies confusion, memory loss, or sensory loss. Endo: Denies change in weight, skin, hair change.  Heme/Lymph: No excessive bleeding, bruising, orenlarged lymph nodes.  Filed Vitals:   02/01/13 0928  BP: 124/76  Pulse: 76  Temp: 98.1 F (36.7 C)  Resp: 16    Estimated body mass index is 26.85 kg/(m^2) as calculated from the following:   Height as of 05/05/12: 4\' 11"  (1.499 m).   Weight as of this encounter: 133 lb (60.328 kg).  On Exam: Appears well nourished - in no distress. Eyes: PERRLA, EOMs, conjunctiva no swelling or erythema. Sinuses: No frontal/maxillary tenderness ENT/Mouth: EAC's clear, TM's nl w/o erythema, bulging. Nares clear w/o erythema, swelling, exudates. Oropharynx clear without erythema or exudates. Oral hygiene is good. Tongue normal, non obstructing. Hearing intact.  Neck: Supple. Thyroid nl. Car 2+/2+ without bruits, nodes or JVD. Chest: Respirations nl with BS clear & equal w/o rales, rhonchi, wheezing or stridor.  Cor: Heart sounds normal w/ regular rate and rhythm without sig. murmurs, gallops, clicks, or rubs. Peripheral pulses normal and equal  without edema.  Abdomen: Soft & bowel sounds normal. Non-tender w/o guarding, rebound, hernias, masses, or organomegaly.  Lymphatics: Unremarkable.  Musculoskeletal: Full ROM all peripheral extremities, joint stability, 5/5 strength, and normal gait.  Skin: Warm, dry without exposed rashes, lesions, ecchymosis apparent.  Neuro: Cranial nerves intact, reflexes equal  bilaterally. Sensory-motor testing grossly intact. Tendon reflexes grossly intact.  Pysch: Alert & oriented x 3. Insight and judgement nl & appropriate. No ideations.  Assessment and Plan:  1. Hypertension - Continue monitor blood pressure at home. Continue diet/meds same.  2. Hyperlipidemia - Continue diet/meds, exercise,& lifestyle modifications. Continue monitor periodic cholesterol/liver & renal functions   3. T2 Diabetes - continue recommend prudent low glycemic diet, weight control, regular exercise, diabetic monitoring and periodic eye exams.  4. Vitamin D Deficiency - Continue supplementation.  Recommended regular exercise, BP monitoring, weight control, and discussed med and SE's. Recommended labs to assess and monitor clinical status. Further disposition pending results of labs.

## 2013-02-01 NOTE — Patient Instructions (Addendum)

## 2013-02-02 LAB — INSULIN, FASTING: Insulin fasting, serum: 21 u[IU]/mL (ref 3–28)

## 2013-02-02 LAB — VITAMIN D 25 HYDROXY (VIT D DEFICIENCY, FRACTURES): Vit D, 25-Hydroxy: 86 ng/mL (ref 30–89)

## 2013-03-13 ENCOUNTER — Other Ambulatory Visit: Payer: Self-pay | Admitting: Internal Medicine

## 2013-03-13 DIAGNOSIS — K2100 Gastro-esophageal reflux disease with esophagitis, without bleeding: Secondary | ICD-10-CM

## 2013-03-13 DIAGNOSIS — I10 Essential (primary) hypertension: Secondary | ICD-10-CM

## 2013-03-13 MED ORDER — RANITIDINE HCL 300 MG PO TABS: 300.0000 mg | ORAL_TABLET | Freq: Every day | ORAL | Status: DC

## 2013-03-13 MED ORDER — METOPROLOL SUCCINATE ER 25 MG PO TB24: 25.0000 mg | ORAL_TABLET | Freq: Every day | ORAL | Status: DC

## 2013-04-24 ENCOUNTER — Other Ambulatory Visit: Payer: Self-pay | Admitting: Emergency Medicine

## 2013-04-24 MED ORDER — POTASSIUM CHLORIDE CRYS ER 20 MEQ PO TBCR: 20.0000 meq | EXTENDED_RELEASE_TABLET | Freq: Three times a day (TID) | ORAL | Status: DC

## 2013-05-10 ENCOUNTER — Ambulatory Visit (INDEPENDENT_AMBULATORY_CARE_PROVIDER_SITE_OTHER): Payer: BC Managed Care – PPO | Admitting: Physician Assistant

## 2013-05-10 ENCOUNTER — Encounter: Payer: Self-pay | Admitting: Physician Assistant

## 2013-05-10 VITALS — BP 122/68 | HR 80 | Temp 97.7°F | Resp 16 | Ht 59.0 in | Wt 135.0 lb

## 2013-05-10 DIAGNOSIS — E782 Mixed hyperlipidemia: Secondary | ICD-10-CM

## 2013-05-10 DIAGNOSIS — E559 Vitamin D deficiency, unspecified: Secondary | ICD-10-CM

## 2013-05-10 DIAGNOSIS — Z79899 Other long term (current) drug therapy: Secondary | ICD-10-CM

## 2013-05-10 DIAGNOSIS — I10 Essential (primary) hypertension: Secondary | ICD-10-CM

## 2013-05-10 DIAGNOSIS — E119 Type 2 diabetes mellitus without complications: Secondary | ICD-10-CM

## 2013-05-10 LAB — HEPATIC FUNCTION PANEL
ALT: 15 U/L (ref 0–35)
AST: 14 U/L (ref 0–37)
Albumin: 4 g/dL (ref 3.5–5.2)
Alkaline Phosphatase: 73 U/L (ref 39–117)
Bilirubin, Direct: 0.1 mg/dL (ref 0.0–0.3)
Indirect Bilirubin: 0.1 mg/dL — ABNORMAL LOW (ref 0.2–1.2)
Total Bilirubin: 0.2 mg/dL (ref 0.2–1.2)
Total Protein: 6.7 g/dL (ref 6.0–8.3)

## 2013-05-10 LAB — HEMOGLOBIN A1C
Hgb A1c MFr Bld: 7.1 % — ABNORMAL HIGH (ref <5.7)
Mean Plasma Glucose: 157 mg/dL — ABNORMAL HIGH (ref <117)

## 2013-05-10 LAB — LIPID PANEL
Cholesterol: 138 mg/dL (ref 0–200)
HDL: 49 mg/dL (ref >39)
LDL Cholesterol: 77 mg/dL (ref 0–99)
Total CHOL/HDL Ratio: 2.8 Ratio
Triglycerides: 60 mg/dL (ref <150)
VLDL: 12 mg/dL (ref 0–40)

## 2013-05-10 LAB — TSH: TSH: 1.258 u[IU]/mL (ref 0.350–4.500)

## 2013-05-10 LAB — BASIC METABOLIC PANEL WITH GFR
BUN: 12 mg/dL (ref 6–23)
CO2: 28 mEq/L (ref 19–32)
Calcium: 9.2 mg/dL (ref 8.4–10.5)
Chloride: 102 mEq/L (ref 96–112)
Creat: 0.49 mg/dL — ABNORMAL LOW (ref 0.50–1.10)
GFR, Est African American: >89 mL/min
GFR, Est Non African American: >89 mL/min
Glucose, Bld: 85 mg/dL (ref 70–99)
Potassium: 3.9 mEq/L (ref 3.5–5.3)
Sodium: 141 mEq/L (ref 135–145)

## 2013-05-10 LAB — CBC WITH DIFFERENTIAL/PLATELET
Basophils Absolute: 0 10*3/uL (ref 0.0–0.1)
Basophils Relative: 0 % (ref 0–1)
Eosinophils Absolute: 0.1 10*3/uL (ref 0.0–0.7)
Eosinophils Relative: 1 % (ref 0–5)
HCT: 38.8 % (ref 36.0–46.0)
Hemoglobin: 12.7 g/dL (ref 12.0–15.0)
Lymphocytes Relative: 40 % (ref 12–46)
Lymphs Abs: 3 10*3/uL (ref 0.7–4.0)
MCH: 30 pg (ref 26.0–34.0)
MCHC: 32.7 g/dL (ref 30.0–36.0)
MCV: 91.7 fL (ref 78.0–100.0)
Monocytes Absolute: 0.4 10*3/uL (ref 0.1–1.0)
Monocytes Relative: 6 % (ref 3–12)
Neutro Abs: 3.9 10*3/uL (ref 1.7–7.7)
Neutrophils Relative %: 53 % (ref 43–77)
Platelets: 282 10*3/uL (ref 150–400)
RBC: 4.23 MIL/uL (ref 3.87–5.11)
RDW: 13.9 % (ref 11.5–15.5)
WBC: 7.4 10*3/uL (ref 4.0–10.5)

## 2013-05-10 LAB — MAGNESIUM: Magnesium: 1.6 mg/dL (ref 1.5–2.5)

## 2013-05-10 NOTE — Patient Instructions (Addendum)
Please get a pedometer and if you are walking 10,000 steps at work than you are getting exercise at work if you are walking less than that you need to start walking outside of work.     Bad carbs also include fruit juice, alcohol, and sweet tea. These are empty calories that do not signal to your brain that you are full.   Please remember the good carbs are still carbs which convert into sugar. So please measure them out no more than 1/2-1 cup of rice, oatmeal, pasta, and beans.  Veggies are however free foods! Pile them on.   I like lean protein at every meal such as chicken, [place], pork chops, cottage cheese, etc. Just do not fry these meats and please center your meal around vegetable, the meats should be a side dish.   No all fruit is created equal. Please see the list below, the fruit at the bottom is higher in sugars than the fruit at the top   What is the TMJ? The temporomandibular (tem-PUH-ro-man-DIB-yoo-ler) joint, or the TMJ, connects the upper and lower jawbones. This joint allows the jaw to open wide and move back and forth when you chew, talk, or yawn.There are also several muscles that help this joint move. There can be muscle tightness and pain in the muscle that can cause several symptoms.  What causes TMJ pain? There are many causes of TMJ pain. Repeated chewing (for example, chewing gum) and clenching your teeth can cause pain in the joint. Some TMJ pain has no obvious cause. What can I do to ease the pain? There are many things you can do to help your pain get better. When you have pain:  Eat soft foods and stay away from chewy foods (for example, taffy) Try to use both sides of your mouth to chew Don't chew gum Don't open your mouth wide (for example, during yawning or singing) Don't bite your cheeks or fingernails Lower your amount of stress and worry Applying a warm, damp washcloth to the joint may help. Over-the-counter pain medicines such as ibuprofen (one brand:  Advil) or acetaminophen (one brand: Tylenol) might also help. Do not use these medicines if you are allergic to them or if your doctor told you not to use them. How can I stop the pain from coming back? When your pain is better, you can do these exercises to make your muscles stronger and to keep the pain from coming back:  Resisted mouth opening: Place your thumb or two fingers under your chin and open your mouth slowly, pushing up lightly on your chin with your thumb. Hold for three to six seconds. Close your mouth slowly. Resisted mouth closing: Place your thumbs under your chin and your two index fingers on the ridge between your mouth and the bottom of your chin. Push down lightly on your chin as you close your mouth. Tongue up: Slowly open and close your mouth while keeping the tongue touching the roof of the mouth. Side-to-side jaw movement: Place an object about one fourth of an inch thick (for example, two tongue depressors) between your front teeth. Slowly move your jaw from side to side. Increase the thickness of the object as the exercise becomes easier Forward jaw movement: Place an object about one fourth of an inch thick between your front teeth and move the bottom jaw forward so that the bottom teeth are in front of the top teeth. Increase the thickness of the object as the exercise becomes  easier. These exercises should not be painful. If it hurts to do these exercises, stop doing them and talk to your family doctor.   Biceps Tendon Tendinitis (Proximal) and Tenosynovitis with Rehab Tendonitis and tenosynovitis involve inflammation of the tendon and the tendon lining (sheath). The proximal biceps tendon is vulnerable to tendonitis and tenosynovitis, which causes pain and discomfort in the front of the shoulder and upper arm. The tendon lining secretes a fluid that helps lubricate the tendon, allowing for proper function without pain. When the tendon and its lining become inflamed, the  tendon can no longer glide smoothly, causing pain. The proximal biceps tendon connects the biceps muscle to two bones of the shoulder. It is important for proper function of the elbow and turning the palm upward (supination) using the wrist. Proximal biceps tendon tendinitis may include a grade 1 or 2 strain of the tendon. Grade 1 strains involve a slight pull of the tendon without signs of tearing and no observed tendon lengthening. There is also no loss of strength. Grade 2 strains involve small tears in the tendon fibers. The tendon or muscle is stretched and strength is usually decreased.  SYMPTOMS   Pain, tenderness, swelling, warmth, or redness over the front of the shoulder.  Pain that gets worse with shoulder and elbow use, especially against resistance.  Limited motion of the shoulder or elbow.  Crackling sound (crepitation) when the tendon or shoulder is moved or touched. CAUSES  The symptoms of biceps tendonitis are due to inflammation of the tendon. Inflammation may be caused by:  Strain from sudden increase in amount or intensity of activity.  Direct blow or injury to the elbow (uncommon).  Overuse or repetitive elbow bending or wrist rotation, particularly when turning the palm up, or with elbow hyperextension. RISK INCREASES WITH:  Sports that involve contact or overhead arm activity (throwing sports, gymnastics, weightlifting, bodybuilding, rock climbing).  Heavy labor.  Poor strength and flexibility.  Failure to warm up properly before activity. PREVENTION  Warm up and stretch properly before activity.  Allow time for recovery between activities.  Maintain physical fitness:  Strength, flexibility, and endurance.  Cardiovascular fitness.  Learn and use proper exercise technique. PROGNOSIS  With proper treatment, proximal biceps tendon tendonitis and tenosynovitis is usually curable within 6 weeks. Healing is usually quicker if the cause was a direct blow, not  overuse.  RELATED COMPLICATIONS   Longer healing time if not properly treated or if not given enough time to heal.  Chronically inflamed tendon that causes persistent pain with activity, that may progress to constant pain and potentially rupture of the tendon.  Recurring symptoms, especially if activity is resumed too soon or with overuse, a direct blow, or use of poor exercise technique. TREATMENT Treatment first involves ice and medicine, to reduce pain and inflammation. It is helpful to modify activities that cause pain, to reduce the chances of causing the condition to get worse. Strengthening and stretching exercises should be performed to promote proper use of the muscles of the shoulder. These exercises may be performed at home or with a therapist. Other treatments may be given such as ultrasound or heat therapy. A corticosteroid injection may be recommended to help reduce inflammation of the tendon lining. Surgery is usually not necessary. Sometimes, if symptoms last for greater than 6 months, surgery will be advised to detach the tendon and re-insert it into the arm bone. Surgery to correct other shoulder problems that may be contributing to tendinitis may be  advised before surgery for the tendinitis itself.  MEDICATION  If pain medicine is needed, nonsteroidal anti-inflammatory medicines (aspirin and ibuprofen), or other minor pain relievers (acetaminophen), are often advised.  Do not take pain medicine for 7 days before surgery.  Prescription pain relievers may be given if your caregiver thinks they are needed. Use only as directed and only as much as you need.  Corticosteroid injections may be given. These injections should only be used on the most severe cases, as one can only receive a limited number of them. HEAT AND COLD   Cold treatment (icing) should be applied for 10 to 15 minutes every 2 to 3 hours for inflammation and pain, and immediately after activity that aggravates your  symptoms. Use ice packs or an ice massage.  Heat treatment may be used before performing stretching and strengthening activities prescribed by your caregiver, physical therapist, or athletic trainer. Use a heat pack or a warm water soak. SEEK MEDICAL CARE IF:   Symptoms get worse or do not improve in 2 weeks, despite treatment.  New, unexplained symptoms develop. (Drugs used in treatment may produce side effects.) EXERCISES RANGE OF MOTION (ROM) AND EXERCISES - Biceps Tendon (Proximal) and Tenosynovitis These exercises may help you when beginning to rehabilitate your injury. Your symptoms may go away with or without further involvement from your physician, physical therapist, or athletic trainer. While completing these exercises, remember:   Restoring tissue flexibility helps normal motion to return to the joints. This allows healthier, less painful movement and activity.  An effective stretch should be held for at least 30 seconds.  A stretch should never be painful. You should only feel a gentle lengthening or release in the stretched tissue. STRETCH  Flexion, Standing  Stand with good posture. With an underhand grip on your right / left hand and an overhand grip on the opposite hand, grasp a broomstick or cane so that your hands are a little more than shoulder width apart.  Keeping your right / left elbow straight and shoulder muscles relaxed, push the stick with your opposite hand to raise your right / left arm in front of your body and then overhead. Raise your arm until you feel a stretch in your right / left shoulder, but before you have increased shoulder pain.  Try to avoid shrugging your right / left shoulder as your arm rises, by keeping your shoulder blade tucked down and toward your mid-back spine. Hold for __________ seconds.  Slowly return to the starting position. Repeat __________ times. Complete this exercise __________ times per day. STRETCH  Abduction, Supine  Lie on  your back. With an underhand grip on your right / left hand and an overhand grip on the opposite hand, grasp a broomstick or cane so that your hands are a little more than shoulder width apart.  Keeping your right / left elbow straight and shoulder muscles relaxed, push the stick with your opposite hand to raise your right / left arm out to the side of your body and then overhead. Raise your arm until you feel a stretch in your right / left shoulder, but before you have increased shoulder pain.  Try to avoid shrugging your right / left shoulder as your arm rises, by keeping your shoulder blade tucked down and toward your mid-back spine. Hold for __________ seconds.  Slowly return to the starting position. Repeat __________ times. Complete this exercise __________ times per day. ROM  Flexion, Active-Assisted  Lie on your back. You  may bend your knees for comfort.  Grasp a broomstick or cane so your hands are about shoulder width apart. Your right / left hand should grip the end of the stick so that your hand is positioned "thumbs-up," as if you were about to shake hands.  Using your healthy arm to lead, raise your right / left arm overhead until you feel a gentle stretch in your shoulder. Hold for __________ seconds.  Use the stick to assist in returning your right / left arm to its starting position. Repeat __________ times. Complete this exercise __________ times per day.  STRETCH  Flexion, Standing   Stand facing a wall. Walk your right / left fingers up the wall until you feel a moderate stretch in your shoulder. As your hand gets higher, you may need to step closer to the wall or use a door frame to walk through.  Try to avoid shrugging your right / left shoulder as your arm rises, by keeping your shoulder blade tucked down and toward your mid-back spine.  Hold for __________ seconds. Use your other hand, if needed, to ease out of the stretch and return to the starting position. Repeat  __________ times. Complete this exercise __________ times per day.  ROM - Internal Rotation   Using underhand grips, grasp a stick behind your back with both hands.  While standing upright with good posture, slide the stick up your back until you feel a mild stretch in the front of your shoulder.  Hold for __________ seconds. Slowly return to your starting position. Repeat __________ times. Complete this exercise __________ times per day.  STRETCH - Internal Rotation  Place your right / left hand behind your back, palm-up.  Throw a towel or belt over your opposite shoulder. Grasp the towel with your right / left hand.  While keeping an upright posture, gently pull up on the towel until you feel a stretch in the front of your right / left shoulder.  Avoid shrugging your right / left shoulder as your arm rises, by keeping your shoulder blade tucked down and toward your mid-back spine.  Hold for __________ seconds. Release the stretch by lowering your opposite hand. Repeat __________ times. Complete this exercise __________ times per day. STRENGTHENING EXERCISES - Biceps Tendon Tendinitis (Proximal) and Tenosynovitis These exercises may help you regain your strength after your physician has discontinued your restraint in a cast or brace. They may resolve your symptoms with or without further involvement from your physician, physical therapist or athletic trainer. While completing these exercises, remember:   Muscles can gain both the endurance and the strength needed for everyday activities through controlled exercises.  Complete these exercises as instructed by your physician, physical therapist or athletic trainer. Increase the resistance and repetitions only as guided.  You may experience muscle soreness or fatigue, but the pain or discomfort you are trying to eliminate should never worsen during these exercises. If this pain does get worse, stop and make sure you are following the  directions exactly. If the pain is still present after adjustments, discontinue the exercise until you can discuss the trouble with your caregiver. STRENGTH - Elbow Flexors, Isometric  Stand or sit upright on a firm surface. Place your right / left arm so that your hand is palm-up and at the height of your waist.  Place your opposite hand on top of your forearm. Gently push down as your right / left arm resists. Push as hard as you can with both arms,  without causing any pain or movement at your right / left elbow. Hold this stationary position for __________ seconds.  Gradually release the tension in both arms. Allow your muscles to relax completely before repeating. Repeat __________ times. Complete this exercise __________ times per day. STRENGTH - Shoulder Flexion, Isometric  With good posture and facing a wall, stand or sit about 4-6 inches away.  Keeping your right / left elbow straight, gently press the top of your fist into the wall. Increase the pressure gradually until you are pressing as hard as you can, without shrugging your shoulder or increasing any shoulder discomfort.  Hold for __________ seconds.  Release the tension slowly. Relax your shoulder muscles completely before you start the next repetition. Repeat __________ times. Complete this exercise __________ times per day.  STRENGTH  Elbow Flexors, Supinated  With good posture, stand or sit on a firm chair without armrests. Allow your right / left arm to rest at your side with your palm facing forward.  Holding a __________ weight, or gripping a rubber exercise band or tubing,  bring your hand toward your shoulder.  Allow your muscles to control the resistance as your hand returns to your side. Repeat __________ times. Complete this exercise __________ times per day.  STRENGTH - Shoulder Flexion  Stand or sit with good posture. Grasp a __________ weight, or an exercise band or tubing, so that your hand is "thumbs-up,"  like when you shake hands.  Slowly lift your right / left arm as far as you can, without increasing any shoulder pain. At first, many people can only raise their hand to shoulder height.  Avoid shrugging your right / left shoulder as your arm rises, by keeping your shoulder blade tucked down and toward your mid-back spine.  Hold for __________ seconds. Control the descent of your hand as you slowly return to your starting position. Repeat __________ times. Complete this exercise __________ times per day. Document Released: 02/01/2005 Document Revised: 04/26/2011 Document Reviewed: 05/16/2008 [facility] Patient Information 2014 [place], [place].

## 2013-05-10 NOTE — Progress Notes (Signed)
HPI 58 y.o. female  presents for 3 month follow up with hypertension, hyperlipidemia, diabetes and vitamin D. Her blood pressure has been controlled at home, today their BP is BP: 122/68 mmHg She does not workout. She denies chest pain, shortness of breath, dizziness.  She is on cholesterol medication and denies myalgias. Her cholesterol is at goal. The cholesterol last visit was:   Lab Results  Component Value Date   CHOL 147 02/01/2013   HDL 46 02/01/2013   LDLCALC 77 02/01/2013   TRIG 121 02/01/2013   CHOLHDL 3.2 02/01/2013   She has been working on diet and exercise for Diabetes, and denies polydipsia, polyuria and visual disturbances. Last A1C in the office was:  Lab Results  Component Value Date   HGBA1C 6.7* 02/01/2013   Patient is on Vitamin D supplement.     Current Medications:  Current Outpatient Prescriptions on File Prior to Visit  Medication Sig Dispense Refill  . Armodafinil (NUVIGIL) 150 MG tablet Take 150 mg by mouth as needed (when working).       [name] aspirin EC 81 MG tablet Take 81 mg by mouth daily.      . B Complex-C (SUPER B COMPLEX PO) Take 1 tablet by mouth daily. Takes every other day      . Cholecalciferol (VITAMIN D) 2000 UNITS CAPS Take 1 capsule by mouth 2 (two) times daily.      . cycloSPORINE (RESTASIS) 0.05 % ophthalmic emulsion Place 1 drop into both eyes daily. prn      . ferrous sulfate 325 (65 FE) MG tablet Take 325 mg by mouth daily with breakfast.       . FIBER FORMULA PO Take 1 tablet by mouth daily.       . fluticasone (FLONASE) 50 MCG/ACT nasal spray Place 2 sprays into the nose daily as needed. For congestion      . lisinopril-hydrochlorothiazide (PRINZIDE,ZESTORETIC) 10-12.5 MG per tablet Take 1 tablet by mouth daily.      [name] MAGNESIUM PO Take 2 tablets by mouth daily.      . meloxicam (MOBIC) 15 MG tablet Take 15 mg by mouth as needed for pain.      . metFORMIN (GLUCOPHAGE) 1000 MG tablet Take 1,000 mg by mouth 2 (two) times daily with a  meal.      . metoprolol succinate (TOPROL-XL) 25 MG 24 hr tablet Take 1 tablet (25 mg total) by mouth daily.  90 tablet  99  . Multiple Vitamin (MULITIVITAMIN WITH MINERALS) TABS Take 1 tablet by mouth daily.      . Omega-3 Fatty Acids (FISH OIL) 1000 MG CAPS Take 1 capsule (1,000 mg total) by mouth daily.    0  . potassium chloride SA (KLOR-CON M20) 20 MEQ tablet Take 1 tablet (20 mEq total) by mouth 3 (three) times daily.  270 tablet  1  . pravastatin (PRAVACHOL) 40 MG tablet Take 40 mg by mouth every other day.      . ranitidine (ZANTAC) 300 MG tablet Take 1 tablet (300 mg total) by mouth daily.  90 tablet  99  . traMADol (ULTRAM) 50 MG tablet Take 50 mg by mouth as needed for pain.       No current facility-administered medications on file prior to visit.   Medical History:  Past Medical History  Diagnosis Date  . Diabetes mellitus   . Hypertension   . HYPERLIPIDEMIA   . CEREBROVASCULAR DISEASE   . BICUSPID AORTIC VALVE   .  GERD   . DEGENERATIVE JOINT DISEASE   . ASTHMA   . Palpitation   . Vitamin D deficiency   . HSV-1 (herpes simplex virus 1) infection   . DJD (degenerative joint disease)   . Allergic rhinitis    Allergies:  Allergies  Allergen Reactions  . Naprosyn [Naproxen] Other (See Comments)    GI upset     Review of Systems: [X]  = complains of  [ ]  = denies  General: Fatigue [ ]  Fever [ ]  Chills [ ]  Weakness [ ]   Insomnia [ ]  Eyes: Redness [ ]  Blurred vision [ ]  Diplopia [ ]   ENT: Congestion [ ]  Sinus Pain [ ]  Post Nasal Drip [ ]  Sore Throat [ ]  Earache [ ]   Cardiac: Chest pain/pressure [ ]  SOB [ ]  Orthopnea [ ]   Palpitations [ ]   Paroxysmal nocturnal dyspnea[ ]  Claudication [ ]  Edema [ ]   Pulmonary: Cough [ ]  Wheezing[ ]   SOB [ ]   Snoring [ ]   GI: Nausea [ ]  Vomiting[ ]  Dysphagia[ ]  Heartburn[ ]  Abdominal pain [ ]  Constipation [ ] ; Diarrhea [ ] ; BRBPR [ ]  Melena[ ]  GU: Hematuria[ ]  Dysuria [ ]  Nocturia[ ]  Urgency [ ]   Hesitancy [ ]  Discharge [ ]  Neuro:  Headaches[ ]  Vertigo[ ]  Paresthesias[ ]  Spasm [ ]  Speech changes [ ]  Incoordination [ ]   Ortho: Arthritis [ ]  Joint pain [ X] Muscle pain [ X] Joint swelling [ ]  Back Pain [ ]  Skin:  Rash [ ]   Pruritis [ ]  Change in skin lesion [ ]   Psych: Depression[ ]  Anxiety[ ]  Confusion [ ]  Memory loss [ ]   Heme/Lypmh: Bleeding [ ]  Bruising [ ]  Enlarged lymph nodes [ ]   Endocrine: Visual blurring [ ]  Paresthesia [ X] Polyuria [ ]  Polydypsea [ ]    Heat/cold intolerance [ ]  Hypoglycemia [ ]   Family history- Review and unchanged Social history- Review and unchanged Physical Exam: BP 122/68  Pulse 80  Temp(Src) 97.7 F (36.5 C)  Resp 16  Ht 4\' 11"  (1.499 m)  Wt 135 lb (61.236 kg)  BMI 27.25 kg/m2 Wt Readings from Last 3 Encounters:  05/10/13 135 lb (61.236 kg)  02/01/13 133 lb (60.328 kg)  05/05/12 136 lb (61.689 kg)   General Appearance: Well nourished, in no apparent distress. Eyes: PERRLA, EOMs, conjunctiva no swelling or erythema Sinuses: No Frontal/maxillary tenderness, + TMJ left side ENT/Mouth: Ext aud canals clear, TMs without erythema, bulging. No erythema, swelling, or exudate on post pharynx.  Tonsils not swollen or erythematous. Hearing normal.  Neck: Supple, thyroid normal.  Respiratory: Respiratory effort normal, BS equal bilaterally without rales, rhonchi, wheezing or stridor.  Cardio: RRR with no MRGs. Brisk peripheral pulses without edema.  Abdomen: Soft, + BS, obese.  Non tender, no guarding, rebound, hernias, masses. Lymphatics: Non tender without lymphadenopathy.  Musculoskeletal: Full ROM, 5/5 strength, normal gait. Right elbow nontender, full ROM. Right shoulder with bicipital tenderness, no erythema, warmth, swelling.  Skin: Warm, dry without rashes, lesions, ecchymosis.  Neuro: Cranial nerves intact. No cerebellar symptoms. Sensation intact.  Psych: Awake and oriented X 3, normal affect, Insight and Judgment appropriate.   Assessment and Plan:  Hypertension: Continue  medication, monitor blood pressure at home.  Continue DASH diet. Cholesterol: Continue diet and exercise. Check cholesterol.  Diabetes-Continue diet and exercise. Check A1C. Discussed general issues about diabetes pathophysiology and management., Educational material distributed., Suggested low cholesterol diet., Encouraged aerobic exercise., Discussed foot care., Reminded to get yearly retinal exam. Vitamin  D Def- check level and continue medications.  Right biceps tendontiis- exercises given and if not better suggest injection TMJ- information given to the patient, no gum/decrease hard foods, warm wet wash clothes, decrease stress, talk with dentist about possible night guard, can do massage, and exercise.  Obesity- Obesity with co morbidities- long discussion about weight loss, diet, and exercise    Continue diet and meds as discussed. Further disposition pending results of labs. Discussed med's effects and SE's.  OVER 40 minutes of exam, counseling, chart review, referral performed    Vicie Mutters 8:45 AM

## 2013-05-11 LAB — VITAMIN D 25 HYDROXY (VIT D DEFICIENCY, FRACTURES): Vit D, 25-Hydroxy: 70 ng/mL (ref 30–89)

## 2013-05-11 LAB — INSULIN, FASTING: Insulin fasting, serum: 5 u[IU]/mL (ref 3–28)

## 2013-07-27 ENCOUNTER — Encounter: Payer: Self-pay | Admitting: Internal Medicine

## 2013-07-27 ENCOUNTER — Other Ambulatory Visit (HOSPITAL_COMMUNITY)
Admission: RE | Admit: 2013-07-27 | Discharge: 2013-07-27 | Disposition: A | Discharge status: Home / Self Care | Payer: BC Managed Care – PPO | Source: Ambulatory Visit | Attending: Internal Medicine | Admitting: Internal Medicine

## 2013-07-27 ENCOUNTER — Ambulatory Visit (INDEPENDENT_AMBULATORY_CARE_PROVIDER_SITE_OTHER): Payer: BC Managed Care – PPO | Admitting: Internal Medicine

## 2013-07-27 VITALS — BP 126/72 | HR 88 | Temp 97.6°F | Resp 16 | Ht <= 58 in | Wt 137.4 lb

## 2013-07-27 DIAGNOSIS — R7402 Elevation of levels of lactic acid dehydrogenase (LDH): Secondary | ICD-10-CM

## 2013-07-27 DIAGNOSIS — Z111 Encounter for screening for respiratory tuberculosis: Secondary | ICD-10-CM

## 2013-07-27 DIAGNOSIS — Z113 Encounter for screening for infections with a predominantly sexual mode of transmission: Secondary | ICD-10-CM

## 2013-07-27 DIAGNOSIS — Z0000 Encounter for general adult medical examination without abnormal findings: Secondary | ICD-10-CM

## 2013-07-27 DIAGNOSIS — Z1151 Encounter for screening for human papillomavirus (HPV): Secondary | ICD-10-CM | POA: Insufficient documentation

## 2013-07-27 DIAGNOSIS — R7401 Elevation of levels of liver transaminase levels: Secondary | ICD-10-CM

## 2013-07-27 DIAGNOSIS — Z1212 Encounter for screening for malignant neoplasm of rectum: Secondary | ICD-10-CM

## 2013-07-27 DIAGNOSIS — E1129 Type 2 diabetes mellitus with other diabetic kidney complication: Secondary | ICD-10-CM

## 2013-07-27 DIAGNOSIS — Z01419 Encounter for gynecological examination (general) (routine) without abnormal findings: Secondary | ICD-10-CM | POA: Insufficient documentation

## 2013-07-27 DIAGNOSIS — I10 Essential (primary) hypertension: Secondary | ICD-10-CM | POA: Insufficient documentation

## 2013-07-27 DIAGNOSIS — E559 Vitamin D deficiency, unspecified: Secondary | ICD-10-CM

## 2013-07-27 DIAGNOSIS — Z124 Encounter for screening for malignant neoplasm of cervix: Secondary | ICD-10-CM

## 2013-07-27 DIAGNOSIS — R740 Nonspecific elevation of levels of transaminase and lactic acid dehydrogenase [LDH]: Secondary | ICD-10-CM

## 2013-07-27 LAB — CBC WITH DIFFERENTIAL/PLATELET
Basophils Absolute: 0 10*3/uL (ref 0.0–0.1)
Basophils Relative: 0 % (ref 0–1)
Eosinophils Absolute: 0.1 10*3/uL (ref 0.0–0.7)
Eosinophils Relative: 1 % (ref 0–5)
HCT: 37.9 % (ref 36.0–46.0)
Hemoglobin: 12.6 g/dL (ref 12.0–15.0)
Lymphocytes Relative: 33 % (ref 12–46)
Lymphs Abs: 2.5 10*3/uL (ref 0.7–4.0)
MCH: 30.7 pg (ref 26.0–34.0)
MCHC: 33.2 g/dL (ref 30.0–36.0)
MCV: 92.4 fL (ref 78.0–100.0)
Monocytes Absolute: 0.4 10*3/uL (ref 0.1–1.0)
Monocytes Relative: 5 % (ref 3–12)
Neutro Abs: 4.7 10*3/uL (ref 1.7–7.7)
Neutrophils Relative %: 61 % (ref 43–77)
Platelets: 288 10*3/uL (ref 150–400)
RBC: 4.1 MIL/uL (ref 3.87–5.11)
RDW: 13.7 % (ref 11.5–15.5)
WBC: 7.7 10*3/uL (ref 4.0–10.5)

## 2013-07-27 LAB — HEMOGLOBIN A1C
Hgb A1c MFr Bld: 7.5 % — ABNORMAL HIGH (ref <5.7)
Mean Plasma Glucose: 169 mg/dL — ABNORMAL HIGH (ref <117)

## 2013-07-27 NOTE — Progress Notes (Signed)
Patient ID: [name], female   DOB: 1955-12-26, 58 y.o.   MRN: [id number]   Annual Screening Comprehensive Examination  This very nice 58 y.o.DBF presents for complete physical.  Patient has been followed for HTN, T2_NIDDM, Hyperlipidemia, and Vitamin D Deficiency.    HTN predates since 2003. Patient's BP has been controlled at home. Today's BP: 126/72 mmHg. Patient denies any cardiac symptoms as chest pain, palpitations, shortness of breath, dizziness or ankle swelling.   Patient's hyperlipidemia is controlled with diet and medications. Patient denies myalgias or other medication SE's. Last lipids in Mar 2015 as below were at goal.   Lab Results  Component Value Date   CHOL 138 05/10/2013   HDL 49 05/10/2013   LDLCALC 77 05/10/2013   TRIG 60 05/10/2013   CHOLHDL 2.8 05/10/2013    Patient has T2_NIDDM predating since 1996 and last A1c was 7.1% in Mar 2015. Patient denies reactive hypoglycemic symptoms, visual blurring, diabetic polys, or paresthesias.    Finally, patient has history of Vitamin D Deficiency of 30 in 2008 and last vitamin D was 80 in Sept 2014.  Medication Sig  . Armodafinil (NUVIGIL) 150 MG tablet Take 150 mg by mouth as needed (when working).   [name] aspirin EC 81 MG tablet Take 81 mg by mouth daily.  . B Complex-C (SUPER B COMPLEX PO) Take 1 tablet by mouth daily. Takes every other day  . Cholecalciferol (VITAMIN D) 2000 UNITS CAPS Take 1 capsule by mouth 2 (two) times daily.  . RESTASIS 0.05 % ophthalmic emulsion Place 1 drop into both eyes daily. prn  . ferrous sulfate 325 (65 FE) MG Take 325 mg by mouth daily with breakfast.   . FIBER FORMULA PO Take 1 tablet by mouth daily.   [name] FLONASE nasal  Place 2 sprays into the nose daily as needed. For congestion  . lisinopril-hctz  10-12.5 MG per tablet Take 1 tablet by mouth daily.  [name] MAGNESIUM PO Take 2 tablets by mouth daily.  [name] MOBIC 15 MG tablet Take 15 mg by mouth as needed for pain.  . metFORMIN  1000 MG tablet  Take 1,000 mg by mouth 2 (two) times daily with a meal.  . metoprolol succinate 25 MG Take 1 tablet (25 mg total) by mouth daily.  [name] WITH MINERALS Take 1 tablet by mouth daily.  [name] FISH OIL 1000 MG  Take 1 capsule (1,000 mg total) by mouth daily.  .  (KLOR-CON 20 MEQ tablet Take 1 tablet (20 mEq total) by mouth 3 (three) times daily.  . pravastatin 40 MG tablet Take 40 mg by mouth every other day.  . Ranitidine 300 MG tablet Take 1 tablet (300 mg total) by mouth daily.  . traMADol 50 MG tablet Take 50 mg by mouth as needed for pain.   Allergies  Allergen Reactions  . Naprosyn [Naproxen] Other (See Comments)    GI upset   Past Medical History  Diagnosis Date  . Diabetes mellitus   . Hypertension   . HYPERLIPIDEMIA   . CEREBROVASCULAR DISEASE   . BICUSPID AORTIC VALVE   . GERD   . DEGENERATIVE JOINT DISEASE   . ASTHMA   . Palpitation   . Vitamin D deficiency   . HSV-1 (herpes simplex virus 1) infection   . DJD (degenerative joint disease)   . Allergic rhinitis     Past Surgical History  Procedure Laterality Date  . Repair of trigger finger    . Abdominal hysterectomy  Family History  Problem Relation Age of Onset  . Coronary artery disease Mother   . Heart disease Mother   . Hypertension Mother   . Diabetes Father   . Hypertension Father     History  Substance Use Topics  . Smoking status: Never Smoker   . Smokeless tobacco: Not on file  . Alcohol Use: Yes     Comment: very rarely    ROS Constitutional: Denies fever, chills, weight loss/gain, headaches, insomnia, fatigue, night sweats, and change in appetite. Eyes: Denies redness, blurred vision, diplopia, discharge, itchy, watery eyes.  ENT: Denies discharge, congestion, post nasal drip, epistaxis, sore throat, earache, hearing loss, dental pain, Tinnitus, Vertigo, Sinus pain, snoring.  Cardio: Denies chest pain, palpitations, irregular heartbeat, syncope, dyspnea, diaphoresis, orthopnea, PND,  claudication, edema Respiratory: denies cough, dyspnea, DOE, pleurisy, hoarseness, laryngitis, wheezing.  Gastrointestinal: Denies dysphagia, heartburn, reflux, water brash, pain, cramps, nausea, vomiting, bloating, diarrhea, constipation, hematemesis, melena, hematochezia, jaundice, hemorrhoids Genitourinary: Denies dysuria, frequency, urgency, nocturia, hesitancy, discharge, hematuria, flank pain Breast:Breast lumps, nipple discharge, bleeding.  Musculoskeletal: Denies arthralgia, myalgia, stiffness, Jt. Swelling, pain, limp, and strain/sprain. Skin: Denies puritis, rash, hives, warts, acne, eczema, changing in skin lesion Neuro: No weakness, tremor, incoordination, spasms, paresthesia, pain Psychiatric: Denies confusion, memory loss, sensory loss Endocrine: Denies change in weight, skin, hair change, nocturia, and paresthesia, diabetic polys, visual blurring, hyper / hypo glycemic episodes.  Heme/Lymph: No excessive bleeding, bruising, enlarged lymph nodes.  Physical Exam  BP 126/72  Pulse 88  Temp 97.6 F   Resp 16  Ht 4\' 10"    Wt 137 lb 6.4 oz   BMI 28.72 kg/m2  General Appearance: Well nourished, in no apparent distress. Eyes: PERRLA, EOMs, conjunctiva no swelling or erythema, normal fundi and vessels. Sinuses: No frontal/maxillary tenderness ENT/Mouth: EACs patent / TMs  nl. Nares clear without erythema, swelling, mucoid exudates. Oral hygiene is good. No erythema, swelling, or exudate. Tongue normal, non-obstructing. Tonsils not swollen or erythematous. Hearing normal.  Neck: Supple, thyroid normal. No bruits, nodes or JVD. Respiratory: Respiratory effort normal.  BS equal and clear bilateral without rales, rhonci, wheezing or stridor. Cardio: Heart sounds are normal with regular rate and rhythm and no murmurs, rubs or gallops. Peripheral pulses are normal and equal bilaterally without edema. No aortic or femoral bruits. Chest: symmetric with normal excursions and  percussion. Breasts: Symmetric, without lumps, nipple discharge, retractions, or fibrocystic changes.  Abdomen: Flat, soft, with bowl sounds. Nontender, no guarding, rebound, hernias, masses, or organomegaly.  Lymphatics: Non tender without lymphadenopathy.  Genitourinary: Pap taken at vaginal cuff (s/p TAH w/o k/o path) Musculoskeletal: Full ROM all peripheral extremities, joint stability, 5/5 strength, and normal gait. Skin: Warm and dry without rashes, lesions, cyanosis, clubbing or  ecchymosis.  Neuro: Cranial nerves intact, reflexes equal bilaterally. Normal muscle tone, no cerebellar symptoms. Sensation intact.  Pysch: Awake and oriented X 3, normal affect, Insight and Judgment appropriate.   Assessment and Plan  1. Annual Screening Examination 2. Hypertension  3. Hyperlipidemia 4. T2_NIDDM 5. Vitamin D Deficiency  Continue prudent diet as discussed, weight control, BP monitoring, regular exercise, and medications. Discussed med's effects and SE's. Screening labs and tests as requested with regular follow-up as recommended.

## 2013-07-27 NOTE — Patient Instructions (Signed)

## 2013-07-28 LAB — HEPATITIS C ANTIBODY: HCV Ab: NEGATIVE

## 2013-07-28 LAB — HEPATIC FUNCTION PANEL
ALT: 20 U/L (ref 0–35)
AST: 16 U/L (ref 0–37)
Albumin: 4.5 g/dL (ref 3.5–5.2)
Alkaline Phosphatase: 84 U/L (ref 39–117)
Bilirubin, Direct: <0.1 mg/dL (ref 0.0–0.3)
Total Bilirubin: 0.2 mg/dL (ref 0.2–1.2)
Total Protein: 7.7 g/dL (ref 6.0–8.3)

## 2013-07-28 LAB — BASIC METABOLIC PANEL WITH GFR
BUN: 17 mg/dL (ref 6–23)
CO2: 26 mEq/L (ref 19–32)
Calcium: 9.9 mg/dL (ref 8.4–10.5)
Chloride: 101 mEq/L (ref 96–112)
Creat: 0.59 mg/dL (ref 0.50–1.10)
GFR, Est African American: >89 mL/min
GFR, Est Non African American: >89 mL/min
Glucose, Bld: 136 mg/dL — ABNORMAL HIGH (ref 70–99)
Potassium: 3.8 mEq/L (ref 3.5–5.3)
Sodium: 140 mEq/L (ref 135–145)

## 2013-07-28 LAB — IRON AND TIBC
%SAT: 13 % — ABNORMAL LOW (ref 20–55)
Iron: 44 ug/dL (ref 42–145)
TIBC: 337 ug/dL (ref 250–470)
UIBC: 293 ug/dL (ref 125–400)

## 2013-07-28 LAB — LIPID PANEL
Cholesterol: 147 mg/dL (ref 0–200)
HDL: 55 mg/dL (ref >39)
LDL Cholesterol: 78 mg/dL (ref 0–99)
Total CHOL/HDL Ratio: 2.7 Ratio
Triglycerides: 71 mg/dL (ref <150)
VLDL: 14 mg/dL (ref 0–40)

## 2013-07-28 LAB — HEPATITIS B SURFACE ANTIBODY,QUALITATIVE: Hep B S Ab: POSITIVE — AB

## 2013-07-28 LAB — INSULIN, FASTING: Insulin fasting, serum: 40 u[IU]/mL — ABNORMAL HIGH (ref 3–28)

## 2013-07-28 LAB — TSH: TSH: 0.982 u[IU]/mL (ref 0.350–4.500)

## 2013-07-28 LAB — URINALYSIS, MICROSCOPIC ONLY
Bacteria, UA: NONE SEEN
Casts: NONE SEEN
Crystals: NONE SEEN
Squamous Epithelial / LPF: NONE SEEN

## 2013-07-28 LAB — VITAMIN D 25 HYDROXY (VIT D DEFICIENCY, FRACTURES): Vit D, 25-Hydroxy: 75 ng/mL (ref 30–89)

## 2013-07-28 LAB — MICROALBUMIN / CREATININE URINE RATIO
Creatinine, Urine: 47 mg/dL
Microalb Creat Ratio: 10.6 mg/g (ref 0.0–30.0)
Microalb, Ur: 0.5 mg/dL (ref 0.00–1.89)

## 2013-07-28 LAB — HIV ANTIBODY (ROUTINE TESTING W REFLEX): HIV 1&2 Ab, 4th Generation: NONREACTIVE

## 2013-07-28 LAB — VITAMIN B12: Vitamin B-12: 1257 pg/mL — ABNORMAL HIGH (ref 211–911)

## 2013-07-28 LAB — HEPATITIS A ANTIBODY, TOTAL: Hep A Total Ab: REACTIVE — AB

## 2013-07-28 LAB — HEPATITIS B CORE ANTIBODY, TOTAL: Hep B Core Total Ab: NONREACTIVE

## 2013-07-28 LAB — MAGNESIUM: Magnesium: 1.6 mg/dL (ref 1.5–2.5)

## 2013-07-30 LAB — TB SKIN TEST
Induration: 0 mm
TB Skin Test: NEGATIVE

## 2013-07-30 LAB — HEPATITIS B E ANTIBODY: Hepatitis Be Antibody: NONREACTIVE

## 2013-07-31 ENCOUNTER — Other Ambulatory Visit (INDEPENDENT_AMBULATORY_CARE_PROVIDER_SITE_OTHER): Payer: BC Managed Care – PPO

## 2013-07-31 DIAGNOSIS — N3000 Acute cystitis without hematuria: Secondary | ICD-10-CM

## 2013-07-31 LAB — CYTOLOGY - PAP

## 2013-08-01 LAB — URINALYSIS, ROUTINE W REFLEX MICROSCOPIC
Bilirubin Urine: NEGATIVE
Glucose, UA: NEGATIVE mg/dL
Hgb urine dipstick: NEGATIVE
Ketones, ur: NEGATIVE mg/dL
Nitrite: NEGATIVE
Protein, ur: NEGATIVE mg/dL
Specific Gravity, Urine: 1.02 (ref 1.005–1.030)
Urobilinogen, UA: 0.2 mg/dL (ref 0.0–1.0)
pH: 7.5 (ref 5.0–8.0)

## 2013-08-01 LAB — URINALYSIS, MICROSCOPIC ONLY
Bacteria, UA: NONE SEEN
Casts: NONE SEEN

## 2013-08-02 LAB — URINE CULTURE: Colony Count: 6000

## 2013-08-22 ENCOUNTER — Other Ambulatory Visit (INDEPENDENT_AMBULATORY_CARE_PROVIDER_SITE_OTHER): Payer: BC Managed Care – PPO

## 2013-08-22 DIAGNOSIS — Z1212 Encounter for screening for malignant neoplasm of rectum: Secondary | ICD-10-CM

## 2013-08-22 LAB — POC HEMOCCULT BLD/STL (HOME/3-CARD/SCREEN)
Card #2 Fecal Occult Blod, POC: NEGATIVE
Card #3 Fecal Occult Blood, POC: NEGATIVE
Fecal Occult Blood, POC: NEGATIVE

## 2013-08-30 ENCOUNTER — Other Ambulatory Visit: Payer: Self-pay | Admitting: Emergency Medicine

## 2013-09-18 ENCOUNTER — Other Ambulatory Visit: Payer: Self-pay | Admitting: *Deleted

## 2013-09-18 MED ORDER — METFORMIN HCL 1000 MG PO TABS: 1000.0000 mg | ORAL_TABLET | Freq: Two times a day (BID) | ORAL | Status: DC

## 2013-09-18 MED ORDER — PRAVASTATIN SODIUM 40 MG PO TABS: 40.0000 mg | ORAL_TABLET | ORAL | Status: DC

## 2013-09-24 ENCOUNTER — Other Ambulatory Visit: Payer: Self-pay | Admitting: *Deleted

## 2013-09-25 ENCOUNTER — Other Ambulatory Visit: Payer: Self-pay | Admitting: *Deleted

## 2013-09-25 MED ORDER — METFORMIN HCL ER 500 MG PO TB24: ORAL_TABLET | ORAL | Status: DC

## 2013-11-08 ENCOUNTER — Ambulatory Visit: Payer: Self-pay | Admitting: Physician Assistant

## 2013-11-15 ENCOUNTER — Ambulatory Visit (INDEPENDENT_AMBULATORY_CARE_PROVIDER_SITE_OTHER): Payer: BC Managed Care – PPO | Admitting: Physician Assistant

## 2013-11-15 ENCOUNTER — Encounter: Payer: Self-pay | Admitting: Physician Assistant

## 2013-11-15 VITALS — BP 138/68 | HR 76 | Temp 97.9°F | Resp 16 | Ht 59.0 in | Wt 141.0 lb

## 2013-11-15 DIAGNOSIS — G5601 Carpal tunnel syndrome, right upper limb: Secondary | ICD-10-CM

## 2013-11-15 DIAGNOSIS — E559 Vitamin D deficiency, unspecified: Secondary | ICD-10-CM

## 2013-11-15 DIAGNOSIS — Z23 Encounter for immunization: Secondary | ICD-10-CM

## 2013-11-15 DIAGNOSIS — Z79899 Other long term (current) drug therapy: Secondary | ICD-10-CM

## 2013-11-15 DIAGNOSIS — E119 Type 2 diabetes mellitus without complications: Secondary | ICD-10-CM

## 2013-11-15 DIAGNOSIS — E782 Mixed hyperlipidemia: Secondary | ICD-10-CM

## 2013-11-15 DIAGNOSIS — I10 Essential (primary) hypertension: Secondary | ICD-10-CM

## 2013-11-15 DIAGNOSIS — M67921 Unspecified disorder of synovium and tendon, right upper arm: Secondary | ICD-10-CM

## 2013-11-15 LAB — HEPATIC FUNCTION PANEL
ALT: 14 U/L (ref 0–35)
AST: 14 U/L (ref 0–37)
Albumin: 4.2 g/dL (ref 3.5–5.2)
Alkaline Phosphatase: 78 U/L (ref 39–117)
Bilirubin, Direct: <0.1 mg/dL (ref 0.0–0.3)
Total Bilirubin: 0.2 mg/dL (ref 0.2–1.2)
Total Protein: 6.9 g/dL (ref 6.0–8.3)

## 2013-11-15 LAB — CBC WITH DIFFERENTIAL/PLATELET
Basophils Absolute: 0.1 10*3/uL (ref 0.0–0.1)
Basophils Relative: 1 % (ref 0–1)
Eosinophils Absolute: 0.1 10*3/uL (ref 0.0–0.7)
Eosinophils Relative: 1 % (ref 0–5)
HCT: 38 % (ref 36.0–46.0)
Hemoglobin: 12.5 g/dL (ref 12.0–15.0)
Lymphocytes Relative: 33 % (ref 12–46)
Lymphs Abs: 2.9 10*3/uL (ref 0.7–4.0)
MCH: 30.5 pg (ref 26.0–34.0)
MCHC: 32.9 g/dL (ref 30.0–36.0)
MCV: 92.7 fL (ref 78.0–100.0)
Monocytes Absolute: 0.5 10*3/uL (ref 0.1–1.0)
Monocytes Relative: 6 % (ref 3–12)
Neutro Abs: 5.2 10*3/uL (ref 1.7–7.7)
Neutrophils Relative %: 59 % (ref 43–77)
Platelets: 308 10*3/uL (ref 150–400)
RBC: 4.1 MIL/uL (ref 3.87–5.11)
RDW: 14 % (ref 11.5–15.5)
WBC: 8.8 10*3/uL (ref 4.0–10.5)

## 2013-11-15 LAB — HEMOGLOBIN A1C
Hgb A1c MFr Bld: 8.1 % — ABNORMAL HIGH (ref <5.7)
Mean Plasma Glucose: 186 mg/dL — ABNORMAL HIGH (ref <117)

## 2013-11-15 LAB — MAGNESIUM: Magnesium: 1.7 mg/dL (ref 1.5–2.5)

## 2013-11-15 LAB — BASIC METABOLIC PANEL WITH GFR
BUN: 14 mg/dL (ref 6–23)
CO2: 25 mEq/L (ref 19–32)
Calcium: 9.6 mg/dL (ref 8.4–10.5)
Chloride: 99 mEq/L (ref 96–112)
Creat: 0.59 mg/dL (ref 0.50–1.10)
GFR, Est African American: >89 mL/min
GFR, Est Non African American: >89 mL/min
Glucose, Bld: 75 mg/dL (ref 70–99)
Potassium: 3.8 mEq/L (ref 3.5–5.3)
Sodium: 138 mEq/L (ref 135–145)

## 2013-11-15 LAB — LIPID PANEL
Cholesterol: 139 mg/dL (ref 0–200)
HDL: 49 mg/dL (ref >39)
LDL Cholesterol: 74 mg/dL (ref 0–99)
Total CHOL/HDL Ratio: 2.8 Ratio
Triglycerides: 82 mg/dL (ref <150)
VLDL: 16 mg/dL (ref 0–40)

## 2013-11-15 LAB — TSH: TSH: 1.078 u[IU]/mL (ref 0.350–4.500)

## 2013-11-15 MED ORDER — DEXAMETHASONE SODIUM PHOSPHATE 100 MG/10ML IJ SOLN
10.0000 mg | Freq: Once | INTRAMUSCULAR | Status: AC
  Administered 2013-11-15: 10 mg via INTRAMUSCULAR

## 2013-11-15 NOTE — Patient Instructions (Signed)
Wear carpal tunnel brace on right wrist at night for 6-8 weeks, can ice wrist.   Biceps Tendon Tendinitis (Proximal) and Tenosynovitis with Rehab Tendonitis and tenosynovitis involve inflammation of the tendon and the tendon lining (sheath). The proximal biceps tendon is vulnerable to tendonitis and tenosynovitis, which causes pain and discomfort in the front of the shoulder and upper arm. The tendon lining secretes a fluid that helps lubricate the tendon, allowing for proper function without pain. When the tendon and its lining become inflamed, the tendon can no longer glide smoothly, causing pain. The proximal biceps tendon connects the biceps muscle to two bones of the shoulder. It is important for proper function of the elbow and turning the palm upward (supination) using the wrist. Proximal biceps tendon tendinitis may include a grade 1 or 2 strain of the tendon. Grade 1 strains involve a slight pull of the tendon without signs of tearing and no observed tendon lengthening. There is also no loss of strength. Grade 2 strains involve small tears in the tendon fibers. The tendon or muscle is stretched and strength is usually decreased.  SYMPTOMS   Pain, tenderness, swelling, warmth, or redness over the front of the shoulder.  Pain that gets worse with shoulder and elbow use, especially against resistance.  Limited motion of the shoulder or elbow.  Crackling sound (crepitation) when the tendon or shoulder is moved or touched. CAUSES  The symptoms of biceps tendonitis are due to inflammation of the tendon. Inflammation may be caused by:  Strain from sudden increase in amount or intensity of activity.  Direct blow or injury to the elbow (uncommon).  Overuse or repetitive elbow bending or wrist rotation, particularly when turning the palm up, or with elbow hyperextension. RISK INCREASES WITH:  Sports that involve contact or overhead arm activity (throwing sports, gymnastics, weightlifting,  bodybuilding, rock climbing).  Heavy labor.  Poor strength and flexibility.  Failure to warm up properly before activity. PREVENTION  Warm up and stretch properly before activity.  Allow time for recovery between activities.  Maintain physical fitness:  Strength, flexibility, and endurance.  Cardiovascular fitness.  Learn and use proper exercise technique. PROGNOSIS  With proper treatment, proximal biceps tendon tendonitis and tenosynovitis is usually curable within 6 weeks. Healing is usually quicker if the cause was a direct blow, not overuse.  RELATED COMPLICATIONS   Longer healing time if not properly treated or if not given enough time to heal.  Chronically inflamed tendon that causes persistent pain with activity, that may progress to constant pain and potentially rupture of the tendon.  Recurring symptoms, especially if activity is resumed too soon or with overuse, a direct blow, or use of poor exercise technique. TREATMENT Treatment first involves ice and medicine, to reduce pain and inflammation. It is helpful to modify activities that cause pain, to reduce the chances of causing the condition to get worse. Strengthening and stretching exercises should be performed to promote proper use of the muscles of the shoulder. These exercises may be performed at home or with a therapist. Other treatments may be given such as ultrasound or heat therapy. A corticosteroid injection may be recommended to help reduce inflammation of the tendon lining. Surgery is usually not necessary. Sometimes, if symptoms last for greater than 6 months, surgery will be advised to detach the tendon and re-insert it into the arm bone. Surgery to correct other shoulder problems that may be contributing to tendinitis may be advised before surgery for the tendinitis itself.  MEDICATION  If pain medicine is needed, nonsteroidal anti-inflammatory medicines (aspirin and ibuprofen), or other minor pain relievers  (acetaminophen), are often advised.  Do not take pain medicine for 7 days before surgery.  Prescription pain relievers may be given if your caregiver thinks they are needed. Use only as directed and only as much as you need.  Corticosteroid injections may be given. These injections should only be used on the most severe cases, as one can only receive a limited number of them. HEAT AND COLD   Cold treatment (icing) should be applied for 10 to 15 minutes every 2 to 3 hours for inflammation and pain, and immediately after activity that aggravates your symptoms. Use ice packs or an ice massage.  Heat treatment may be used before performing stretching and strengthening activities prescribed by your caregiver, physical therapist, or athletic trainer. Use a heat pack or a warm water soak. SEEK MEDICAL CARE IF:   Symptoms get worse or do not improve in 2 weeks, despite treatment.  New, unexplained symptoms develop. (Drugs used in treatment may produce side effects.) EXERCISES RANGE OF MOTION (ROM) AND EXERCISES - Biceps Tendon (Proximal) and Tenosynovitis These exercises may help you when beginning to rehabilitate your injury. Your symptoms may go away with or without further involvement from your physician, physical therapist, or athletic trainer. While completing these exercises, remember:   Restoring tissue flexibility helps normal motion to return to the joints. This allows healthier, less painful movement and activity.  An effective stretch should be held for at least 30 seconds.  A stretch should never be painful. You should only feel a gentle lengthening or release in the stretched tissue. STRETCH - Flexion, Standing  Stand with good posture. With an underhand grip on your right / left hand and an overhand grip on the opposite hand, grasp a broomstick or cane so that your hands are a little more than shoulder width apart.  Keeping your right / left elbow straight and shoulder muscles  relaxed, push the stick with your opposite hand to raise your right / left arm in front of your body and then overhead. Raise your arm until you feel a stretch in your right / left shoulder, but before you have increased shoulder pain.  Try to avoid shrugging your right / left shoulder as your arm rises, by keeping your shoulder blade tucked down and toward your mid-back spine. Hold for __________ seconds.  Slowly return to the starting position. Repeat __________ times. Complete this exercise __________ times per day. STRETCH - Abduction, Supine  Lie on your back. With an underhand grip on your right / left hand and an overhand grip on the opposite hand, grasp a broomstick or cane so that your hands are a little more than shoulder width apart.  Keeping your right / left elbow straight and shoulder muscles relaxed, push the stick with your opposite hand to raise your right / left arm out to the side of your body and then overhead. Raise your arm until you feel a stretch in your right / left shoulder, but before you have increased shoulder pain.  Try to avoid shrugging your right / left shoulder as your arm rises, by keeping your shoulder blade tucked down and toward your mid-back spine. Hold for __________ seconds.  Slowly return to the starting position. Repeat __________ times. Complete this exercise __________ times per day. ROM - Flexion, Active-Assisted  Lie on your back. You may bend your knees for comfort.  Grasp a broomstick  or cane so your hands are about shoulder width apart. Your right / left hand should grip the end of the stick so that your hand is positioned "thumbs-up," as if you were about to shake hands.  Using your healthy arm to lead, raise your right / left arm overhead until you feel a gentle stretch in your shoulder. Hold for __________ seconds.  Use the stick to assist in returning your right / left arm to its starting position. Repeat __________ times. Complete this  exercise __________ times per day.  STRETCH - Flexion, Standing   Stand facing a wall. Walk your right / left fingers up the wall until you feel a moderate stretch in your shoulder. As your hand gets higher, you may need to step closer to the wall or use a door frame to walk through.  Try to avoid shrugging your right / left shoulder as your arm rises, by keeping your shoulder blade tucked down and toward your mid-back spine.  Hold for __________ seconds. Use your other hand, if needed, to ease out of the stretch and return to the starting position. Repeat __________ times. Complete this exercise __________ times per day.  ROM - Internal Rotation   Using underhand grips, grasp a stick behind your back with both hands.  While standing upright with good posture, slide the stick up your back until you feel a mild stretch in the front of your shoulder.  Hold for __________ seconds. Slowly return to your starting position. Repeat __________ times. Complete this exercise __________ times per day.  STRETCH - Internal Rotation  Place your right / left hand behind your back, palm-up.  Throw a towel or belt over your opposite shoulder. Grasp the towel with your right / left hand.  While keeping an upright posture, gently pull up on the towel until you feel a stretch in the front of your right / left shoulder.  Avoid shrugging your right / left shoulder as your arm rises, by keeping your shoulder blade tucked down and toward your mid-back spine.  Hold for __________ seconds. Release the stretch by lowering your opposite hand. Repeat __________ times. Complete this exercise __________ times per day. STRENGTHENING EXERCISES - Biceps Tendon Tendinitis (Proximal) and Tenosynovitis These exercises may help you regain your strength after your physician has discontinued your restraint in a cast or brace. They may resolve your symptoms with or without further involvement from your physician, physical  therapist or athletic trainer. While completing these exercises, remember:   Muscles can gain both the endurance and the strength needed for everyday activities through controlled exercises.  Complete these exercises as instructed by your physician, physical therapist or athletic trainer. Increase the resistance and repetitions only as guided.  You may experience muscle soreness or fatigue, but the pain or discomfort you are trying to eliminate should never worsen during these exercises. If this pain does get worse, stop and make sure you are following the directions exactly. If the pain is still present after adjustments, discontinue the exercise until you can discuss the trouble with your caregiver. STRENGTH - Elbow Flexors, Isometric  Stand or sit upright on a firm surface. Place your right / left arm so that your hand is palm-up and at the height of your waist.  Place your opposite hand on top of your forearm. Gently push down as your right / left arm resists. Push as hard as you can with both arms, without causing any pain or movement at your right /  left elbow. Hold this stationary position for __________ seconds.  Gradually release the tension in both arms. Allow your muscles to relax completely before repeating. Repeat __________ times. Complete this exercise __________ times per day. STRENGTH - Shoulder Flexion, Isometric  With good posture and facing a wall, stand or sit about 4-6 inches away.  Keeping your right / left elbow straight, gently press the top of your fist into the wall. Increase the pressure gradually until you are pressing as hard as you can, without shrugging your shoulder or increasing any shoulder discomfort.  Hold for __________ seconds.  Release the tension slowly. Relax your shoulder muscles completely before you start the next repetition. Repeat __________ times. Complete this exercise __________ times per day.  STRENGTH - Elbow Flexors, Supinated  With good  posture, stand or sit on a firm chair without armrests. Allow your right / left arm to rest at your side with your palm facing forward.  Holding a __________ weight, or gripping a rubber exercise band or tubing,  bring your hand toward your shoulder.  Allow your muscles to control the resistance as your hand returns to your side. Repeat __________ times. Complete this exercise __________ times per day.  STRENGTH - Shoulder Flexion  Stand or sit with good posture. Grasp a __________ weight, or an exercise band or tubing, so that your hand is "thumbs-up," like when you shake hands.  Slowly lift your right / left arm as far as you can, without increasing any shoulder pain. At first, many people can only raise their hand to shoulder height.  Avoid shrugging your right / left shoulder as your arm rises, by keeping your shoulder blade tucked down and toward your mid-back spine.  Hold for __________ seconds. Control the descent of your hand as you slowly return to your starting position. Repeat __________ times. Complete this exercise __________ times per day. Document Released: 02/01/2005 Document Revised: 04/26/2011 Document Reviewed: 05/16/2008 [facility] Patient Information 2015 [place], [place]. This information is not intended to replace advice given to you by your health care provider. Make sure you discuss any questions you have with your health care provider.    Bad carbs also include fruit juice, alcohol, and sweet tea. These are empty calories that do not signal to your brain that you are full.   Please remember the good carbs are still carbs which convert into sugar. So please measure them out no more than 1/2-1 cup of rice, oatmeal, pasta, and beans.  Veggies are however free foods! Pile them on.   I like lean protein at every meal such as chicken, [place], pork chops, cottage cheese, etc. Just do not fry these meats and please center your meal around vegetable, the meats should be a  side dish.   No all fruit is created equal. Please see the list below, the fruit at the bottom is higher in sugars than the fruit at the top

## 2013-11-15 NOTE — Progress Notes (Signed)
Assessment and Plan:  Hypertension: Continue medication, monitor blood pressure at home. Continue DASH diet. Cholesterol: Continue diet and exercise. Check cholesterol.  Diabetes-Continue diet and exercise. Check A1C Vitamin D Def- check level and continue medications.  Right bicep tendonitis- dexamethasone 10mg  IM to proximal bicep, tolerated well, exercises given, RICE, if not better PT or ortho referral Right carpal tunnel-wear a brace at night 6-8 weeks, RICE, NSAIDS, if not better will refer to ortho  Continue diet and meds as discussed. Further disposition pending results of labs. Discussed med's effects and SE's.    HPI 58 y.o. female  presents for 3 month follow up with hypertension, hyperlipidemia, diabetes and vitamin D. Her blood pressure has been controlled at home, today their BP is BP: 138/68 mmHg She does not workout. She denies chest pain, shortness of breath, dizziness.  She is on cholesterol medication and denies myalgias. Her cholesterol is at goal. The cholesterol last visit was:   Lab Results  Component Value Date   CHOL 147 07/27/2013   HDL 55 07/27/2013   LDLCALC 78 07/27/2013   TRIG 71 07/27/2013   CHOLHDL 2.7 07/27/2013   She has been working on diet and exercise for Diabetes with history of cerebrovascular disease, she is on bASA and ACE, and denies polydipsia, polyuria and visual disturbances. She does have some paraesthesias in her feet. Last A1C in the office was:  Lab Results  Component Value Date   HGBA1C 7.5* 07/27/2013   Patient is on Vitamin D supplement. Lab Results  Component Value Date   VD25OH 70 07/27/2013     She has 3-6 months of right shoulder pain now with pain into her biceps, she is right handed, she denies weakness or dropping things, nothing makes it worse, flexeril and tramadol help some. 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 She also has numbness and tingling in her right thumb.  Current Medications:  Current Outpatient  Prescriptions on File Prior to Visit  Medication Sig Dispense Refill  . Armodafinil (NUVIGIL) 150 MG tablet Take 150 mg by mouth as needed (when working).       [name] aspirin EC 81 MG tablet Take 81 mg by mouth daily.      . B Complex-C (SUPER B COMPLEX PO) Take 1 tablet by mouth daily. Takes every other day      . Cholecalciferol (VITAMIN D) 2000 UNITS CAPS Take 1 capsule by mouth 2 (two) times daily.      . cycloSPORINE (RESTASIS) 0.05 % ophthalmic emulsion Place 1 drop into both eyes daily. prn      . ferrous sulfate 325 (65 FE) MG tablet Take 325 mg by mouth daily with breakfast.       . FIBER FORMULA PO Take 1 tablet by mouth daily.       . fluticasone (FLONASE) 50 MCG/ACT nasal spray USE 1 SPRAY IN EACH NOSTRIL TWICE DAILY  16 g  2  . lisinopril-hydrochlorothiazide (PRINZIDE,ZESTORETIC) 10-12.5 MG per tablet Take 1 tablet by mouth daily.      [name] MAGNESIUM PO Take 2 tablets by mouth daily.      . meloxicam (MOBIC) 15 MG tablet Take 15 mg by mouth as needed for pain.      . metFORMIN (GLUCOPHAGE XR) 500 MG 24 hr tablet Take 2 tabs 2 times daily with food for diabetes  360 tablet  3  . metoprolol succinate (TOPROL-XL) 25 MG 24 hr tablet Take 1 tablet (25 mg total) by mouth daily.  90 tablet  99  . Multiple Vitamin (MULITIVITAMIN WITH MINERALS) TABS Take 1 tablet by mouth daily.      . Omega-3 Fatty Acids (FISH OIL) 1000 MG CAPS Take 1 capsule (1,000 mg total) by mouth daily.    0  . OVER THE COUNTER MEDICATION 500 mg daily. Tumeric 500 mg daily      . OVER THE COUNTER MEDICATION Soft stool 1 daily      . potassium chloride SA (KLOR-CON M20) 20 MEQ tablet Take 1 tablet (20 mEq total) by mouth 3 (three) times daily.  270 tablet  1  . pravastatin (PRAVACHOL) 40 MG tablet Take 1 tablet (40 mg total) by mouth every other day.  90 tablet  2  . ranitidine (ZANTAC) 300 MG tablet Take 1 tablet (300 mg total) by mouth daily.  90 tablet  99  . traMADol (ULTRAM) 50 MG tablet Take 50 mg by mouth as needed for  pain.       No current facility-administered medications on file prior to visit.   Medical History:  Past Medical History  Diagnosis Date  . Diabetes mellitus   . Hypertension   . HYPERLIPIDEMIA   . CEREBROVASCULAR DISEASE   . BICUSPID AORTIC VALVE   . GERD   . DEGENERATIVE JOINT DISEASE   . ASTHMA   . Palpitation   . Vitamin D deficiency   . HSV-1 (herpes simplex virus 1) infection   . DJD (degenerative joint disease)   . Allergic rhinitis    Allergies:  Allergies  Allergen Reactions  . Naprosyn [Naproxen] Other (See Comments)    GI upset     Review of Systems: [X]  = complains of  [ ]  = denies  General: Fatigue [ ]  Fever [ ]  Chills [ ]  Weakness [ ]   Insomnia [ ]  Eyes: Redness [ ]  Blurred vision [ ]  Diplopia [ ]   ENT: Congestion [ ]  Sinus Pain [ ]  Post Nasal Drip [ ]  Sore Throat [ ]  Earache [ ]   Cardiac: Chest pain/pressure [ ]  SOB [ ]  Orthopnea [ ]   Palpitations [ ]   Paroxysmal nocturnal dyspnea[ ]  Claudication [ ]  Edema [ ]   Pulmonary: Cough [ ]  Wheezing[ ]   SOB [ ]   Snoring [ ]   GI: Nausea [ ]  Vomiting[ ]  Dysphagia[ ]  Heartburn[ ]  Abdominal pain [ ]  Constipation [ ] ; Diarrhea [ ] ; BRBPR [ ]  Melena[ ]  GU: Hematuria[ ]  Dysuria [ ]  Nocturia[ ]  Urgency [ ]   Hesitancy [ ]  Discharge [ ]  Neuro: Headaches[ ]  Vertigo[ ]  Paresthesias[ ]  Spasm [ ]  Speech changes [ ]  Incoordination [ ]   Ortho: Arthritis [ ]  Joint pain [x ] Muscle pain [x ] Joint swelling [ ]  Back Pain [ ]  Skin:  Rash [ ]   Pruritis [ ]  Change in skin lesion [ ]   Psych: Depression[ ]  Anxiety[ ]  Confusion [ ]  Memory loss [ ]   Heme/Lypmh: Bleeding [ ]  Bruising [ ]  Enlarged lymph nodes [ ]   Endocrine: Visual blurring [ ]  Paresthesia [ x] Polyuria [ ]  Polydypsea [ ]    Heat/cold intolerance [ ]  Hypoglycemia [ ]   Family history- Review and unchanged Social history- Review and unchanged Physical Exam: BP 138/68  Pulse 76  Temp(Src) 97.9 F (36.6 C)  Resp 16  Ht 4\' 11"  (1.499 m)  Wt 141 lb (63.957 kg)  BMI 28.46  kg/m2 Wt Readings from Last 3 Encounters:  11/15/13 141 lb (63.957 kg)  07/27/13 137 lb 6.4 oz (62.324 kg)  05/10/13  135 lb (61.236 kg)   General Appearance: Well nourished, in no apparent distress. Eyes: PERRLA, EOMs, conjunctiva no swelling or erythema Sinuses: No Frontal/maxillary tenderness ENT/Mouth: Ext aud canals clear, TMs without erythema, bulging. No erythema, swelling, or exudate on post pharynx.  Tonsils not swollen or erythematous. Hearing normal.  Neck: Supple, thyroid normal.  Respiratory: Respiratory effort normal, BS equal bilaterally without rales, rhonchi, wheezing or stridor.  Cardio: RRR with no MRGs. Brisk peripheral pulses without edema.  Abdomen: Soft, + BS.  Non tender, no guarding, rebound, hernias, masses. Lymphatics: Non tender without lymphadenopathy.  Musculoskeletal: Full ROM, 5/5 strength, normal gait. + tannal's sign right hand, + proximal bicep tendon tenderness on the right, decrease ROM to 90 degree due to pain.  Skin: Warm, dry without rashes, lesions, ecchymosis.  Neuro: Cranial nerves intact. No cerebellar symptoms. Sensation intact.  Psych: Awake and oriented X 3, normal affect, Insight and Judgment appropriate.    Vicie Mutters 8:40 AM

## 2013-11-16 LAB — VITAMIN D 25 HYDROXY (VIT D DEFICIENCY, FRACTURES): Vit D, 25-Hydroxy: 76 ng/mL (ref 30–89)

## 2013-12-12 ENCOUNTER — Telehealth: Payer: Self-pay | Admitting: Internal Medicine

## 2013-12-12 ENCOUNTER — Other Ambulatory Visit: Payer: Self-pay | Admitting: Physician Assistant

## 2013-12-12 MED ORDER — ARMODAFINIL 150 MG PO TABS: 150.0000 mg | ORAL_TABLET | ORAL | Status: DC | PRN

## 2013-12-12 MED ORDER — TRAMADOL HCL 50 MG PO TABS: 50.0000 mg | ORAL_TABLET | ORAL | Status: DC | PRN

## 2013-12-12 NOTE — Telephone Encounter (Signed)
PATIENT REQUESTING RENEWALS TO [facility] ON traMADol (ULTRAM) 50 MG tablet [[id number]] & Armodafinil (NUVIGIL) 150 MG tablet [[id number]  Thank you, Katrina Judeth Horn [facility] [phone] Fax [phone]

## 2013-12-17 ENCOUNTER — Other Ambulatory Visit: Payer: Self-pay | Admitting: Internal Medicine

## 2013-12-26 ENCOUNTER — Encounter: Payer: Self-pay | Admitting: Physician Assistant

## 2013-12-26 ENCOUNTER — Ambulatory Visit (INDEPENDENT_AMBULATORY_CARE_PROVIDER_SITE_OTHER): Payer: BC Managed Care – PPO | Admitting: Physician Assistant

## 2013-12-26 VITALS — BP 128/70 | HR 76 | Temp 97.9°F | Resp 16 | Ht 59.0 in | Wt 138.0 lb

## 2013-12-26 DIAGNOSIS — E119 Type 2 diabetes mellitus without complications: Secondary | ICD-10-CM

## 2013-12-26 MED ORDER — FLUCONAZOLE 150 MG PO TABS: ORAL_TABLET | ORAL | Status: DC

## 2013-12-26 MED ORDER — CANAGLIFLOZIN 300 MG PO TABS: 300.0000 mg | ORAL_TABLET | Freq: Every day | ORAL | Status: DC

## 2013-12-26 NOTE — Progress Notes (Signed)
Diabetes Education and Follow-Up Visit  58 y.o.female presents for diabetic education. She complains of paresthesia of the feet, polyuria and visual disturbances. She does have polydipsia. The patient does not check her sugars at home because she has a fear of needles.    She states she has "pulsating" in her ear, worse with lying down and in the quit, she also has jaw pain with TMJ. She had a normal MRI in 2011 showing fluid in the mastoid. She has chronic allergies/post nasal drip, occ does flonase. She is suppose to be wearing a mouth guard but does not have it.   She states that she  Low fat/carbohydrate diet?  No Nicotine Abuse?  No Medication Compliance?  Yes Exercise?  No, but she states she walks at work constantly for 9 hours.  Alcohol Abuse?  No but she does drink alcohol but very rare  Currently manages diabetes with?   Metformin     Low BS (under 70)?  no  Number of meals per day?  B- varies, she works 3rd shift so she will eat then go to bed  L- she is in bed  D- gets up at 5 and goes to work  [employer]? Nibbles all through work, apples, mostly fruit  Beverages? Water, diet soda rare, + sweet tea but state she "diluates it"  Up to date Eye Exam? Dr. Katy Fitch, 3 weeks ago Does pt check their feet every day? No  ROS: no polyuria or polydipsia, no chest pain, dyspnea or TIA's, no numbness, tingling or pain in extremities  Physical Exam: Blood pressure 128/70, pulse 76, temperature 97.9 F (36.6 C), resp. rate 16, height 4\' 11"  (1.499 m), weight 138 lb (62.596 kg). Body mass index is 27.86 kg/(m^2). General Appearance:  alert, oriented, no acute distress heart sounds regular rate and rhythm, S1, S2 normal, no murmur, click, rub or gallop, chest clear, no hepatosplenomegaly, no carotid bruits, feet: normal DP and PT pulses, no trophic changes or ulcerative lesions and normal sensory exam  Labs: Lab Results  Component Value Date   HGBA1C 8.1* 11/15/2013    Lab Results   Component Value Date   MICROALBUR 0.50 07/27/2013    Lab Results  Component Value Date   CHOL 139 11/15/2013   HDL 49 11/15/2013   LDLCALC 74 11/15/2013   TRIG 82 11/15/2013   CHOLHDL 2.8 11/15/2013     Assessment: 1.  Diabetes type II  2. BP is at goal. 3. Cholesterol is at goal.   Plan: Discussed general issues about diabetes pathophysiology and management. Counseling at today's visit: discussed the need for weight loss. Neurosurgeon distributed. Encouraged aerobic exercise. Discussed foot care. Reminded to get yearly retinal exam. Discussed ways to avoid symptomatic hypoglycemia. Discussed sick day management. Reminded to bring in blood sugar diary at next visit. Follow up in 2 months or as needed. Willing to try invokana, will give diflucan too.   Recommendations: 1.  Patient is counseled on appropriate foot care. 2.  BP goal < 130/80. 3.  LDL goal of < 100, HDL > 40 and TG < 150. 4.  Eye Exam yearly and Dental Exam every 6 months. 5.  Dietary recommendations 6.  Physical Activity recommendations

## 2013-12-26 NOTE — Patient Instructions (Signed)
Please start 100 of Invokana for 1 week then go up to 300mg  of Invokana. This can decrease your blood pressure so please contact us if you have any dizziness. You are peeing out 300-400 calories a day of sugar which can lead to yeast infections, you can take the diflucan as needed but if the infections continue we will stop the medications. We will have you follow up in 1 month to check your kidney function and weight. Call if you need anything.      Bad carbs also include fruit juice, alcohol, and sweet tea. These are empty calories that do not signal to your brain that you are full.   Please remember the good carbs are still carbs which convert into sugar. So please measure them out no more than 1/2-1 cup of rice, oatmeal, pasta, and beans  Veggies are however free foods! Pile them on.   Not all fruit is created equal. Please see the list below, the fruit at the bottom is higher in sugars than the fruit at the top. Please avoid all dried fruits.    Recommendations For Diabetic/Prediabetic Patients:   -  Take medications as prescribed  -  Recommend Dr Fara Olden Fuhrman's book "The End of Diabetes "  And "The End of Dieting"- Can get at  www.[place].com and encourage also get the Audio CD book  - AVOID Animal products, ie. Meat - red/white, Poultry and Dairy/especially cheese - Exercise at least 5 times a week for 30 minutes or preferably daily.  - No Smoking - Drink less than 2 drinks a day.  - Monitor your feet for sores - Have yearly Eye Exams - Recommend annual Flu vaccine  - Recommend Pneumovax and Prevnar vaccines - Shingles Vaccine (Zostavax) if over 88 y.o.  Goals:   - BMI less than 24 - Fasting sugar less than 130 or less than 150 if tapering medicines to lose weight  - Systolic BP less than 383  - Diastolic BP less than 80 - Bad LDL Cholesterol less than 70 - Triglycerides less than 150

## 2014-01-23 ENCOUNTER — Telehealth: Payer: Self-pay | Admitting: Physician Assistant

## 2014-01-23 NOTE — Telephone Encounter (Signed)
Patient aware of directions but did not know know exactly what her BP or fluid pill was. She verbalized understanding of directions

## 2014-01-23 NOTE — Telephone Encounter (Signed)
Please start 100 of Invokana for 1 week then go up to 300mg  of Invokana. This can decreases your blood pressure so please contact us if you have any dizziness, sometime we need to decrease or stop fluid pills or decrease BP meds. You are peeing out 300-400 calories a day of sugar which can lead to yeast infections, you can take the diflucan as needed but if the infections continue we will stop the medications. We will have you follow up in 1 month to check your kidney function and weight. Call if you need anything.

## 2014-02-04 ENCOUNTER — Encounter: Payer: Self-pay | Admitting: Physician Assistant

## 2014-02-04 ENCOUNTER — Ambulatory Visit (INDEPENDENT_AMBULATORY_CARE_PROVIDER_SITE_OTHER): Payer: BC Managed Care – PPO | Admitting: Physician Assistant

## 2014-02-04 VITALS — BP 146/84 | HR 80 | Temp 98.2°F | Resp 16 | Ht 59.0 in | Wt 135.0 lb

## 2014-02-04 DIAGNOSIS — J209 Acute bronchitis, unspecified: Secondary | ICD-10-CM

## 2014-02-04 MED ORDER — AZITHROMYCIN 250 MG PO TABS: ORAL_TABLET | ORAL | Status: AC

## 2014-02-04 MED ORDER — BENZONATATE 100 MG PO CAPS: 200.0000 mg | ORAL_CAPSULE | Freq: Three times a day (TID) | ORAL | Status: AC | PRN

## 2014-02-04 MED ORDER — PREDNISONE 20 MG PO TABS: ORAL_TABLET | ORAL | Status: AC

## 2014-02-04 NOTE — Progress Notes (Signed)
Subjective:    Patient ID: [name], female    DOB: [birth year], 58 y.o.   MRN: [id number]  Cough This is a new problem. Episode onset: 1 week ago. The problem has been gradually worsening. Cough characteristics: Productive with green mucus. Associated symptoms include chills, postnasal drip, rhinorrhea, a sore throat and wheezing. Pertinent negatives include no fever, rash or shortness of breath. Nothing aggravates the symptoms. Risk factors: People sick at work. Treatments tried: Theraflu, Mucinex, Robitussin DM. The treatment provided no relief. Her past medical history is significant for asthma.  Never Smoked GFR= >89 on 11/15/13 Review of Systems  Constitutional: Positive for chills and fatigue. Negative for fever and diaphoresis.  HENT: Positive for congestion, postnasal drip, rhinorrhea, sore throat and trouble swallowing. Negative for sinus pressure.   Eyes: Negative.   Respiratory: Positive for cough and wheezing. Negative for chest tightness and shortness of breath.   Cardiovascular: Negative.   Gastrointestinal: Positive for diarrhea. Negative for nausea, vomiting, abdominal pain and constipation.       Diarrhea is getting better.  Genitourinary: Negative.   Skin: Negative.  Negative for rash.  Neurological: Negative.    Past Medical History  Diagnosis Date  . Diabetes mellitus   . Hypertension   . HYPERLIPIDEMIA   . CEREBROVASCULAR DISEASE   . BICUSPID AORTIC VALVE   . GERD   . DEGENERATIVE JOINT DISEASE   . ASTHMA   . Palpitation   . Vitamin D deficiency   . HSV-1 (herpes simplex virus 1) infection   . DJD (degenerative joint disease)   . Allergic rhinitis    Current Outpatient Prescriptions on File Prior to Visit  Medication Sig Dispense Refill  . Armodafinil (NUVIGIL) 150 MG tablet Take 1 tablet (150 mg total) by mouth as needed (when working). 90 tablet 0  . aspirin EC 81 MG tablet Take 81 mg by mouth daily.    . B Complex-C (SUPER B COMPLEX PO) Take  1 tablet by mouth daily. Takes every other day    . canagliflozin (INVOKANA) 300 MG TABS tablet Take 300 mg by mouth daily before breakfast. 30 tablet 3  . Cholecalciferol (VITAMIN D) 2000 UNITS CAPS Take 1 capsule by mouth 2 (two) times daily.    . cycloSPORINE (RESTASIS) 0.05 % ophthalmic emulsion Place 1 drop into both eyes daily. prn    . ferrous sulfate 325 (65 FE) MG tablet Take 325 mg by mouth daily with breakfast.     . FIBER FORMULA PO Take 1 tablet by mouth daily.     . fluticasone (FLONASE) 50 MCG/ACT nasal spray USE 1 SPRAY IN EACH NOSTRIL TWICE DAILY 16 g 2  . lisinopril-hydrochlorothiazide (PRINZIDE,ZESTORETIC) 10-12.5 MG per tablet TAKE 1 TABLET BY MOUTH EVERY MORNING 90 tablet 3  . MAGNESIUM PO Take 2 tablets by mouth daily.    . meloxicam (MOBIC) 15 MG tablet Take 15 mg by mouth as needed for pain.    . metFORMIN (GLUCOPHAGE XR) 500 MG 24 hr tablet Take 2 tabs 2 times daily with food for diabetes 360 tablet 3  . metoprolol succinate (TOPROL-XL) 25 MG 24 hr tablet Take 1 tablet (25 mg total) by mouth daily. 90 tablet 99  . Multiple Vitamin (MULITIVITAMIN WITH MINERALS) TABS Take 1 tablet by mouth daily.    . Omega-3 Fatty Acids (FISH OIL) 1000 MG CAPS Take 1 capsule (1,000 mg total) by mouth daily.  0  . OVER THE COUNTER MEDICATION 500 mg daily. Tumeric 500 mg daily    .  OVER THE COUNTER MEDICATION Soft stool 1 daily    . potassium chloride SA (KLOR-CON M20) 20 MEQ tablet Take 1 tablet (20 mEq total) by mouth 3 (three) times daily. 270 tablet 1  . pravastatin (PRAVACHOL) 40 MG tablet Take 1 tablet (40 mg total) by mouth every other day. 90 tablet 2  . ranitidine (ZANTAC) 300 MG tablet Take 1 tablet (300 mg total) by mouth daily. 90 tablet 99  . traMADol (ULTRAM) 50 MG tablet Take 1 tablet (50 mg total) by mouth as needed. 90 tablet 0  . fluconazole (DIFLUCAN) 150 MG tablet Use once for yeast infection as needed (Patient not taking: Reported on 02/04/2014) 2 tablet 1   No current  facility-administered medications on file prior to visit.   Allergies  Allergen Reactions  . Naprosyn [Naproxen] Other (See Comments)    GI upset     BP 146/84 mmHg  Pulse 80  Temp(Src) 98.2 F (36.8 C) (Temporal)  Resp 16  Ht 4\' 11"  (1.499 m)  Wt 135 lb (61.236 kg)  BMI 27.25 kg/m2  SpO2 97% Wt Readings from Last 3 Encounters:  02/04/14 135 lb (61.236 kg)  12/26/13 138 lb (62.596 kg)  11/15/13 141 lb (63.957 kg)   Objective:   Physical Exam  Constitutional: She is oriented to person, place, and time. She appears well-developed and well-nourished. She has a sickly appearance. No distress.  HENT:  Head: Normocephalic.  Right Ear: Tympanic membrane, external ear and ear canal normal.  Left Ear: Tympanic membrane, external ear and ear canal normal.  Nose: Nose normal. Right sinus exhibits no maxillary sinus tenderness and no frontal sinus tenderness. Left sinus exhibits no maxillary sinus tenderness and no frontal sinus tenderness.  Mouth/Throat: Uvula is midline and mucous membranes are normal. Mucous membranes are not pale and not dry. No trismus in the jaw. No uvula swelling. Posterior oropharyngeal erythema present. No oropharyngeal exudate, posterior oropharyngeal edema or tonsillar abscesses.  Turbinates mildly erythematous bilaterally.  Eyes: Conjunctivae and lids are normal. Pupils are equal, round, and reactive to light. Right eye exhibits no discharge. Left eye exhibits no discharge. No scleral icterus.  Neck: Trachea normal, normal range of motion and phonation normal. Neck supple. No tracheal tenderness present. No tracheal deviation present.  Cardiovascular: Normal rate, regular rhythm, S1 normal, S2 normal, normal heart sounds, intact distal pulses and normal pulses.  Exam reveals no gallop, no distant heart sounds and no friction rub.   No murmur heard. Pulmonary/Chest: Effort normal. No accessory muscle usage or stridor. No respiratory distress. She has no decreased  breath sounds. She has wheezes. She has no rhonchi. She has no rales. She exhibits no tenderness.  Wheezing in all lung fields  Abdominal: Soft. Bowel sounds are normal. There is no tenderness. There is no rebound and no guarding.  Lymphadenopathy:  No tenderness or LAD.  Neurological: She is alert and oriented to person, place, and time. Gait normal.  Skin: Skin is warm, dry and intact. No rash noted. She is not diaphoretic.  Psychiatric: She has a normal mood and affect. Her speech is normal and behavior is normal. Judgment and thought content normal. Cognition and memory are normal.  Vitals reviewed.  Assessment & Plan:  1. Acute bronchitis, unspecified organism -Take Prednisone as prescribed for inflammation- predniSONE (DELTASONE) 20 MG tablet; Take 3 tablets PO QDaily for 3 days, then take 2 tablets PO QDaily for 3 days, then take 1 tablet PO QDaily for 3 days  Dispense: 18 tablet;  Refill: 0 -Take Z-Pak as prescribed- azithromycin (ZITHROMAX) 250 MG tablet; Take 2 tablets PO on day 1, then 1 tablet PO Q24H x 4 days  Dispense: 6 tablet; Refill: 1 -Take Tessalon Perles as prescribed for cough- benzonatate (TESSALON PERLES) 100 MG capsule; Take 2 capsules (200 mg total) by mouth 3 (three) times daily as needed for cough (Max: 600mg  per day (6 capsules per day)).  Dispense: 120 capsule; Refill: 0 -Continue Mucinex (without DM)- follow directions on package.   Discussed medication effects and SE's.  Pt agreed to treatment plan. If you are not feeling better in 10-14 days, then please call the office. Please keep your follow up appt on 02/14/14.  Kelcey Korus, Stephani Police, PA-C 3:14 PM [facility]

## 2014-02-04 NOTE — Patient Instructions (Signed)
-Take Z-Pak as prescribed. -Take Prednisone as prescribed for inflammation- -Take Tessalon Perles as prescribed for cough. Make sure you are drinking plenty of water to stay hydrated. Continue Mucinex (Without DM) for congestion.   If you are not feeling better in 10-14 days, then please call the office.   Acute Bronchitis Bronchitis is inflammation of the airways that extend from the windpipe into the lungs (bronchi). The inflammation often causes mucus to develop. This leads to a cough, which is the most common symptom of bronchitis.  In acute bronchitis, the condition usually develops suddenly and goes away over time, usually in a couple weeks. Smoking, allergies, and asthma can make bronchitis worse. Repeated episodes of bronchitis may cause further lung problems.  CAUSES Acute bronchitis is most often caused by the same virus that causes a cold. The virus can spread from person to person (contagious) through coughing, sneezing, and touching contaminated objects. SIGNS AND SYMPTOMS   Cough.   Fever.   Coughing up mucus.   Body aches.   Chest congestion.   Chills.   Shortness of breath.   Sore throat.  DIAGNOSIS  Acute bronchitis is usually diagnosed through a physical exam. Your health care provider will also ask you questions about your medical history. Tests, such as chest X-rays, are sometimes done to rule out other conditions.  TREATMENT  Acute bronchitis usually goes away in a couple weeks. Oftentimes, no medical treatment is necessary. Medicines are sometimes given for relief of fever or cough. Antibiotic medicines are usually not needed but may be prescribed in certain situations. In some cases, an inhaler may be recommended to help reduce shortness of breath and control the cough. A cool mist vaporizer may also be used to help thin bronchial secretions and make it easier to clear the chest.  HOME CARE INSTRUCTIONS  Get plenty of rest.   Drink enough fluids  to keep your urine clear or pale yellow (unless you have a medical condition that requires fluid restriction). Increasing fluids may help thin your respiratory secretions (sputum) and reduce chest congestion, and it will prevent dehydration.   Take medicines only as directed by your health care provider.  If you were prescribed an antibiotic medicine, finish it all even if you start to feel better.  Avoid smoking and secondhand smoke. Exposure to cigarette smoke or irritating chemicals will make bronchitis worse. If you are a smoker, consider using nicotine gum or skin patches to help control withdrawal symptoms. Quitting smoking will help your lungs heal faster.   Reduce the chances of another bout of acute bronchitis by washing your hands frequently, avoiding people with cold symptoms, and trying not to touch your hands to your mouth, nose, or eyes.   Keep all follow-up visits as directed by your health care provider.  SEEK MEDICAL CARE IF: Your symptoms do not improve after 1 week of treatment.  SEEK IMMEDIATE MEDICAL CARE IF:  You develop an increased fever or chills.   You have chest pain.   You have severe shortness of breath.  You have bloody sputum.   You develop dehydration.  You faint or repeatedly feel like you are going to pass out.  You develop repeated vomiting.  You develop a severe headache. MAKE SURE YOU:   Understand these instructions.  Will watch your condition.  Will get help right away if you are not doing well or get worse. Document Released: 03/11/2004 Document Revised: 06/18/2013 Document Reviewed: 07/25/2012 [facility] Patient Information 2015 [place], [place]. This  information is not intended to replace advice given to you by your health care provider. Make sure you discuss any questions you have with your health care provider.  

## 2014-02-14 ENCOUNTER — Ambulatory Visit (INDEPENDENT_AMBULATORY_CARE_PROVIDER_SITE_OTHER): Payer: BC Managed Care – PPO | Admitting: Internal Medicine

## 2014-02-14 ENCOUNTER — Encounter: Payer: Self-pay | Admitting: Internal Medicine

## 2014-02-14 VITALS — BP 120/58 | HR 88 | Temp 97.9°F | Resp 16 | Ht 59.0 in | Wt 134.0 lb

## 2014-02-14 DIAGNOSIS — J209 Acute bronchitis, unspecified: Secondary | ICD-10-CM

## 2014-02-14 DIAGNOSIS — E782 Mixed hyperlipidemia: Secondary | ICD-10-CM

## 2014-02-14 DIAGNOSIS — E119 Type 2 diabetes mellitus without complications: Secondary | ICD-10-CM

## 2014-02-14 DIAGNOSIS — E559 Vitamin D deficiency, unspecified: Secondary | ICD-10-CM

## 2014-02-14 DIAGNOSIS — Z79899 Other long term (current) drug therapy: Secondary | ICD-10-CM | POA: Insufficient documentation

## 2014-02-14 DIAGNOSIS — I10 Essential (primary) hypertension: Secondary | ICD-10-CM

## 2014-02-14 DIAGNOSIS — R0989 Other specified symptoms and signs involving the circulatory and respiratory systems: Secondary | ICD-10-CM | POA: Insufficient documentation

## 2014-02-14 LAB — LIPID PANEL
Cholesterol: 159 mg/dL (ref 0–200)
HDL: 49 mg/dL (ref >39)
LDL Cholesterol: 84 mg/dL (ref 0–99)
Total CHOL/HDL Ratio: 3.2 Ratio
Triglycerides: 131 mg/dL (ref <150)
VLDL: 26 mg/dL (ref 0–40)

## 2014-02-14 LAB — CBC WITH DIFFERENTIAL/PLATELET
Basophils Absolute: 0 10*3/uL (ref 0.0–0.1)
Basophils Relative: 0 % (ref 0–1)
Eosinophils Absolute: 0.1 10*3/uL (ref 0.0–0.7)
Eosinophils Relative: 1 % (ref 0–5)
HCT: 41.8 % (ref 36.0–46.0)
Hemoglobin: 13.1 g/dL (ref 12.0–15.0)
Lymphocytes Relative: 29 % (ref 12–46)
Lymphs Abs: 3.5 10*3/uL (ref 0.7–4.0)
MCH: 30 pg (ref 26.0–34.0)
MCHC: 31.3 g/dL (ref 30.0–36.0)
MCV: 95.9 fL (ref 78.0–100.0)
MPV: 11.1 fL (ref 8.6–12.4)
Monocytes Absolute: 0.6 10*3/uL (ref 0.1–1.0)
Monocytes Relative: 5 % (ref 3–12)
Neutro Abs: 7.7 10*3/uL (ref 1.7–7.7)
Neutrophils Relative %: 65 % (ref 43–77)
Platelets: 324 10*3/uL (ref 150–400)
RBC: 4.36 MIL/uL (ref 3.87–5.11)
RDW: 13.9 % (ref 11.5–15.5)
WBC: 11.9 10*3/uL — ABNORMAL HIGH (ref 4.0–10.5)

## 2014-02-14 LAB — HEPATIC FUNCTION PANEL
ALT: 11 U/L (ref 0–35)
AST: 10 U/L (ref 0–37)
Albumin: 4 g/dL (ref 3.5–5.2)
Alkaline Phosphatase: 64 U/L (ref 39–117)
Bilirubin, Direct: 0.1 mg/dL (ref 0.0–0.3)
Indirect Bilirubin: 0.3 mg/dL (ref 0.2–1.2)
Total Bilirubin: 0.4 mg/dL (ref 0.2–1.2)
Total Protein: 6.7 g/dL (ref 6.0–8.3)

## 2014-02-14 LAB — BASIC METABOLIC PANEL WITH GFR
BUN: 14 mg/dL (ref 6–23)
CO2: 28 mEq/L (ref 19–32)
Calcium: 9.5 mg/dL (ref 8.4–10.5)
Chloride: 96 mEq/L (ref 96–112)
Creat: 0.6 mg/dL (ref 0.50–1.10)
GFR, Est African American: >89 mL/min
GFR, Est Non African American: >89 mL/min
Glucose, Bld: 340 mg/dL — ABNORMAL HIGH (ref 70–99)
Potassium: 4 mEq/L (ref 3.5–5.3)
Sodium: 135 mEq/L (ref 135–145)

## 2014-02-14 LAB — MAGNESIUM: Magnesium: 1.7 mg/dL (ref 1.5–2.5)

## 2014-02-14 LAB — TSH: TSH: 0.607 u[IU]/mL (ref 0.350–4.500)

## 2014-02-14 LAB — HEMOGLOBIN A1C
Hgb A1c MFr Bld: 8.3 % — ABNORMAL HIGH (ref <5.7)
Mean Plasma Glucose: 192 mg/dL — ABNORMAL HIGH (ref <117)

## 2014-02-14 LAB — VITAMIN D 25 HYDROXY (VIT D DEFICIENCY, FRACTURES): Vit D, 25-Hydroxy: 52 ng/mL (ref 30–100)

## 2014-02-14 MED ORDER — PREDNISONE 10 MG PO TABS: ORAL_TABLET | ORAL | Status: AC

## 2014-02-14 MED ORDER — LEVOFLOXACIN 500 MG PO TABS: ORAL_TABLET | ORAL | Status: AC

## 2014-02-14 MED ORDER — HYDROCODONE-ACETAMINOPHEN 5-325 MG PO TABS: ORAL_TABLET | ORAL | Status: AC

## 2014-02-14 NOTE — Progress Notes (Signed)
Patient ID: [name], female   DOB: 1955-04-03, 58 y.o.   MRN: [id number]   This very nice 58 y.o.WBF presents for 3 month follow up with Hypertension, Hyperlipidemia, Pre-Diabetes and Vitamin D Deficiency. Patient was treated about a week ago for a respiratory infection and persists with a deep congested cough . No fever/chills.    Patient is treated for HTN & BP has been controlled at home. Today's BP was 128/58. Patient has had no complaints of any cardiac type chest pain, palpitations, dyspnea/orthopnea/PND, dizziness, claudication, or dependent edema.   Hyperlipidemia is controlled with diet & meds. Patient denies myalgias or other med SE's. Last Lipids were at goal  Total Chol 139; HDL  49; LDL  74; Trig 82 on 11/15/2013.   Also, the patient has history of T2_NIDDM and has had no symptoms of reactive hypoglycemia, diabetic polys, paresthesias or visual blurring.  Last A1c was  8.1% on 11/15/2013.   Further, the patient also has history of Vitamin D Deficiency and supplements vitamin D without any suspected side-effects. Last vitamin D was 76 on 11/15/2013.   Medication List   aspirin EC 81 MG tablet  Take 81 mg by mouth daily.     canagliflozin (INVOKANA) 300 MG Tabs tablet  Only taking 1 tab QOD  +++++++++  Take 300 mg by mouth daily before breakfast.     cycloSPORINE 0.05 % ophthalmic emulsion  Commonly known as:  RESTASIS  Place 1 drop into both eyes daily. prn     DUREZOL 0.05 % Emul  Generic drug:  Difluprednate     ferrous sulfate 325 (65 FE) MG tablet  Take 325 mg by mouth daily with breakfast.     FIBER FORMULA PO  Take 1 tablet by mouth daily.     Fish Oil 1000 MG Caps  Take 1 capsule (1,000 mg total) by mouth daily.     fluconazole 150 MG tablet  Commonly known as:  DIFLUCAN  Use once for yeast infection as needed     fluticasone 50 MCG/ACT nasal spray  Commonly known as:  FLONASE  USE 1 SPRAY IN EACH NOSTRIL TWICE DAILY     HYDROcodone-acetaminophen  5-325 MG per tablet  Commonly known as:  NORCO  1/2 to 1 tablet every 3 to 4 hours if needed for cough or pain     levofloxacin 500 MG tablet  Commonly known as:  LEVAQUIN  Take 1 tablet daily with food for infection     lisinopril-hydrochlorothiazide 10-12.5 MG per tablet  Commonly known as:  PRINZIDE,ZESTORETIC  TAKE 1 TABLET BY MOUTH EVERY MORNING     MAGNESIUM PO  Take 2 tablets by mouth daily.     meloxicam 15 MG tablet  Commonly known as:  MOBIC  Take 15 mg by mouth as needed for pain.     metFORMIN 500 MG 24 hr tablet  Commonly known as:  GLUCOPHAGE XR  Take 2 tabs 2 times daily with food for diabetes     metoprolol succinate 25 MG 24 hr tablet  Commonly known as:  TOPROL-XL  Take 1 tablet (25 mg total) by mouth daily.     multivitamin with minerals Tabs tablet  Take 1 tablet by mouth daily.     OVER THE COUNTER MEDICATION  500 mg daily. Tumeric 500 mg daily     OVER THE COUNTER MEDICATION  Soft stool 1 daily     potassium chloride SA 20 MEQ tablet  Commonly known as:  KLOR-CON M20  Take 1 tablet (20 mEq total) by mouth 3 (three) times daily.     pravastatin 40 MG tablet  Commonly known as:  PRAVACHOL  Take 1 tablet (40 mg total) by mouth every other day.     predniSONE 10 MG tablet  Commonly known as:  DELTASONE  1 tab 3 x day for 3 days, then 1 tab 2 x day for 3 days, then 1 tab 1 x day for 5 days     ranitidine 300 MG tablet  Commonly known as:  ZANTAC  Take 1 tablet (300 mg total) by mouth daily.     SUPER B COMPLEX PO  Take 1 tablet by mouth daily. Takes every other day     traMADol 50 MG tablet  Commonly known as:  ULTRAM  Take 1 tablet (50 mg total) by mouth as needed.     Vitamin D 2000 UNITS Caps  Take 1 capsule by mouth 2 (two) times daily.     Allergies  Allergen Reactions  . Naprosyn [Naproxen] Other (See Comments)    GI upset   PMHx:   Past Medical History  Diagnosis Date  . Diabetes mellitus   . Hypertension   .  HYPERLIPIDEMIA   . CEREBROVASCULAR DISEASE   . BICUSPID AORTIC VALVE   . GERD   . DEGENERATIVE JOINT DISEASE   . ASTHMA   . Palpitation   . Vitamin D deficiency   . HSV-1 (herpes simplex virus 1) infection   . DJD (degenerative joint disease)   . Allergic rhinitis    Immunization History  Administered Date(s) Administered  . DTaP 03/18/2004  . Influenza Split 11/15/2013  . Influenza Whole 11/02/2012  . PPD Test 07/30/2013  . Pneumococcal Conjugate-13 11/15/2013  . Pneumococcal Polysaccharide-23 05/16/1996   Past Surgical History  Procedure Laterality Date  . Repair of trigger finger    . Abdominal hysterectomy     FHx:    Reviewed / unchanged  SHx:    Reviewed / unchanged  Systems Review:  Constitutional: Denies fever, chills, wt changes, headaches, insomnia, fatigue, night sweats, change in appetite. Eyes: Denies redness, blurred vision, diplopia, discharge, itchy, watery eyes.  ENT: Denies discharge, congestion, post nasal drip, epistaxis, sore throat, earache, hearing loss, dental pain, tinnitus, vertigo, sinus pain, snoring.  CV: Denies chest pain, palpitations, irregular heartbeat, syncope, dyspnea, diaphoresis, orthopnea, PND, claudication or edema. Respiratory: (+) cough,(-) dyspnea, DOE, pleurisy,(+) hoarseness & laryngitis,(-) wheezing.  Gastrointestinal: Denies dysphagia, odynophagia, heartburn, reflux, water brash, abdominal pain or cramps, nausea, vomiting, bloating, diarrhea, constipation, hematemesis, melena, hematochezia  or hemorrhoids. Genitourinary: Denies dysuria, frequency, urgency, nocturia, hesitancy, discharge, hematuria or flank pain. Musculoskeletal: Denies arthralgias, myalgias, stiffness, jt. swelling, pain, limping or strain/sprain.  Skin: Denies pruritus, rash, hives, warts, acne, eczema or change in skin lesion(s). Neuro: No weakness, tremor, incoordination, spasms, paresthesia or pain. Psychiatric: Denies confusion, memory loss or sensory  loss. Endo: Denies change in weight, skin or hair change.  Heme/Lymph: No excessive bleeding, bruising or enlarged lymph nodes.  Physical Exam  BP 120/58   Pulse 88  Temp97.9 F   Resp 16  Ht 4\' 11"    Wt 134 lb   BMI 27.05   Appears well nourished and in no distress. Eyes: PERRLA, EOMs, conjunctiva no swelling or erythema. Sinuses: No frontal/maxillary tenderness ENT/Mouth: EAC's clear, TM's nl w/o erythema, bulging. Nares clear w/o erythema, swelling, exudates. Oropharynx clear without erythema or exudates. Oral hygiene is good. Tongue normal, non obstructing. Hearing intact.  Neck:  Supple. Thyroid nl. Car 2+/2+ without bruits, nodes or JVD. Chest: Bilat scattered coarse rales & rhonchi- no wheezes - no respiratory distress Cor: Heart sounds normal w/ regular rate and rhythm without sig. murmurs, gallops, clicks, or rubs. Peripheral pulses normal and equal  without edema.  Abdomen: Soft & bowel sounds normal. Non-tender w/o guarding, rebound, hernias, masses, or organomegaly.  Lymphatics: Unremarkable.  Musculoskeletal: Full ROM all peripheral extremities, joint stability, 5/5 strength, and normal gait.  Skin: Warm, dry without exposed rashes, lesions or ecchymosis apparent.  Neuro: Cranial nerves intact, reflexes equal bilaterally. Sensory-motor testing grossly intact. Tendon reflexes grossly intact.  Pysch: Alert & oriented x 3.  Insight and judgement nl & appropriate. No ideations.  Assessment and Plan:  1. Hypertension - Continue monitor blood pressure at home. Continue diet/meds same.  2. Hyperlipidemia - Continue diet/meds, exercise,& lifestyle modifications. Continue monitor periodic cholesterol/liver & renal functions   3. T2_NIDDM - Continue diet, exercise, lifestyle modifications. Monitor appropriate labs. Recc change Invokana from 1 tab qod to 1/2 tab daily  4. Vitamin D Deficiency - Continue supplementation.   Recommended regular exercise, BP monitoring, weight  control, and discussed med and SE's. Recommended labs to assess and monitor clinical status. Further disposition pending results of labs.  Rx Levaquin 500 mg #15, Prednisone 10 mg taper, Tessalon perles,

## 2014-02-14 NOTE — Patient Instructions (Signed)

## 2014-02-15 LAB — INSULIN, FASTING: Insulin fasting, serum: 9.3 u[IU]/mL (ref 2.0–19.6)

## 2014-02-18 ENCOUNTER — Telehealth: Payer: Self-pay | Admitting: *Deleted

## 2014-02-18 NOTE — Telephone Encounter (Signed)
Pt aware of lab results Patient will increase magnesium and vitamin d and takes 1000 mg of Metformin BID.

## 2014-02-24 NOTE — Progress Notes (Signed)
HPI: FU abnormal electrocardiogram, bicuspid aortic valve and palpitations. The patient had a Myoview performed on Jun 16, 2007, that showed no ischemia or infarction. The ejection fraction was 74%. She also had carotid Dopplers performed last in March of 2012 and showed 0% to 39% bilateral stenosis. She had a previous CardioNet that showed no significant arrhythmias. Note, she did not have palpitations during that time. Echocardiogram was performed in March of 2014 and showed normal LV function, no AS or AI, cannot exclude bicuspid valve. Since last seen, she denies dyspnea, chest pain or syncope. Her palpitations are unchanged.  Current Outpatient Prescriptions  Medication Sig Dispense Refill  . Armodafinil (NUVIGIL) 150 MG tablet Take 1 tablet (150 mg total) by mouth as needed (when working). 90 tablet 0  . aspirin EC 81 MG tablet Take 81 mg by mouth daily.    . B Complex-C (SUPER B COMPLEX PO) Take 1 tablet by mouth daily. Takes every other day    . benzonatate (TESSALON PERLES) 100 MG capsule Take 2 capsules (200 mg total) by mouth 3 (three) times daily as needed for cough (Max: 600mg  per day (6 capsules per day)). 120 capsule 0  . canagliflozin (INVOKANA) 300 MG TABS tablet Take 300 mg by mouth daily before breakfast. 30 tablet 3  . Cholecalciferol (VITAMIN D) 2000 UNITS CAPS Take 1 capsule by mouth 2 (two) times daily.    . cycloSPORINE (RESTASIS) 0.05 % ophthalmic emulsion Place 1 drop into both eyes daily. prn    . DUREZOL 0.05 % EMUL   0  . ferrous sulfate 325 (65 FE) MG tablet Take 325 mg by mouth daily with breakfast.     . FIBER FORMULA PO Take 1 tablet by mouth daily.     . fluconazole (DIFLUCAN) 150 MG tablet Use once for yeast infection as needed 2 tablet 1  . fluticasone (FLONASE) 50 MCG/ACT nasal spray USE 1 SPRAY IN EACH NOSTRIL TWICE DAILY 16 g 2  . GMATE BLOOD GLUCOSE TEST test strip   10  . HYDROcodone-acetaminophen (NORCO) 5-325 MG per tablet 1/2 to 1 tablet every 3  to 4 hours if needed for cough or pain 50 tablet 0  . levofloxacin (LEVAQUIN) 500 MG tablet Take 1 tablet daily with food for infection 15 tablet 1  . lisinopril-hydrochlorothiazide (PRINZIDE,ZESTORETIC) 10-12.5 MG per tablet TAKE 1 TABLET BY MOUTH EVERY MORNING 90 tablet 3  . MAGNESIUM PO Take 2 tablets by mouth daily.    . meloxicam (MOBIC) 15 MG tablet Take 15 mg by mouth as needed for pain.    . metFORMIN (GLUCOPHAGE XR) 500 MG 24 hr tablet Take 2 tabs 2 times daily with food for diabetes 360 tablet 3  . metoprolol succinate (TOPROL-XL) 25 MG 24 hr tablet Take 1 tablet (25 mg total) by mouth daily. 90 tablet 99  . Multiple Vitamin (MULITIVITAMIN WITH MINERALS) TABS Take 1 tablet by mouth daily.    . Omega-3 Fatty Acids (FISH OIL) 1000 MG CAPS Take 1 capsule (1,000 mg total) by mouth daily.  0  . OVER THE COUNTER MEDICATION 500 mg daily. Tumeric 500 mg daily    . OVER THE COUNTER MEDICATION Soft stool 1 daily    . potassium chloride SA (KLOR-CON M20) 20 MEQ tablet Take 1 tablet (20 mEq total) by mouth 3 (three) times daily. 270 tablet 1  . pravastatin (PRAVACHOL) 40 MG tablet Take 1 tablet (40 mg total) by mouth every other day. 90 tablet 2  .  predniSONE (DELTASONE) 10 MG tablet 1 tab 3 x day for 3 days, then 1 tab 2 x day for 3 days, then 1 tab 1 x day for 5 days 20 tablet 0  . ranitidine (ZANTAC) 300 MG tablet Take 1 tablet (300 mg total) by mouth daily. 90 tablet 99  . traMADol (ULTRAM) 50 MG tablet Take 1 tablet (50 mg total) by mouth as needed. 90 tablet 0   No current facility-administered medications for this visit.     Past Medical History  Diagnosis Date  . Diabetes mellitus   . Hypertension   . HYPERLIPIDEMIA   . CEREBROVASCULAR DISEASE   . BICUSPID AORTIC VALVE   . GERD   . DEGENERATIVE JOINT DISEASE   . ASTHMA   . Palpitation   . Vitamin D deficiency   . HSV-1 (herpes simplex virus 1) infection   . DJD (degenerative joint disease)   . Allergic rhinitis     Past  Surgical History  Procedure Laterality Date  . Repair of trigger finger    . Abdominal hysterectomy      History   Social History  . Marital Status: Widowed    Spouse Name: N/A    Number of Children: N/A  . Years of Education: N/A   Occupational History  . Not on file.   Social History Main Topics  . Smoking status: Never Smoker   . Smokeless tobacco: Not on file  . Alcohol Use: Yes     Comment: very rarely  . Drug Use: No  . Sexual Activity: Not Currently   Other Topics Concern  . Not on file   Social History Narrative    ROS: no fevers or chills, productive cough, hemoptysis, dysphasia, odynophagia, melena, hematochezia, dysuria, hematuria, rash, seizure activity, orthopnea, PND, pedal edema, claudication. Remaining systems are negative.  Physical Exam: Well-developed well-nourished in no acute distress.  Skin is warm and dry.  HEENT is normal.  Neck is supple.  Chest is clear to auscultation with normal expansion.  Cardiovascular exam is regular rate and rhythm. 1/6 systolic ejection murmur Abdominal exam nontender or distended. No masses palpated. Extremities show no edema. neuro grossly intact  ECG sinus rhythm, cannot rule out prior septal infarct.

## 2014-02-25 ENCOUNTER — Ambulatory Visit (INDEPENDENT_AMBULATORY_CARE_PROVIDER_SITE_OTHER): Payer: BLUE CROSS/BLUE SHIELD | Admitting: Cardiology

## 2014-02-25 ENCOUNTER — Encounter: Payer: Self-pay | Admitting: Cardiology

## 2014-02-25 VITALS — BP 130/50 | HR 58 | Ht <= 58 in | Wt 130.4 lb

## 2014-02-25 DIAGNOSIS — Q231 Congenital insufficiency of aortic valve: Secondary | ICD-10-CM

## 2014-02-25 DIAGNOSIS — I10 Essential (primary) hypertension: Secondary | ICD-10-CM

## 2014-02-25 DIAGNOSIS — I679 Cerebrovascular disease, unspecified: Secondary | ICD-10-CM

## 2014-02-25 DIAGNOSIS — R002 Palpitations: Secondary | ICD-10-CM

## 2014-02-25 DIAGNOSIS — E782 Mixed hyperlipidemia: Secondary | ICD-10-CM

## 2014-02-25 NOTE — Patient Instructions (Signed)
Your physician wants you to follow-up in: ONE YEAR WITH DR CRENSHAW You will receive a reminder letter in the mail two months in advance. If you don't receive a letter, please call our office to schedule the follow-up appointment.  

## 2014-02-25 NOTE — Assessment & Plan Note (Signed)
We will plan follow-up echoes in the future.

## 2014-02-25 NOTE — Assessment & Plan Note (Signed)
Blood pressure controlled. Continue present medications. Potassium and renal function monitored by primary care. 

## 2014-02-25 NOTE — Assessment & Plan Note (Signed)
Continue statin. Lipids and liver monitored by primary care. 

## 2014-02-25 NOTE — Assessment & Plan Note (Signed)
Mild on previous carotid Dopplers. Continue aspirin and statin.

## 2014-02-25 NOTE — Assessment & Plan Note (Signed)
Patient continues to have palpitations. They are unchanged in severity. I discussed possibly increasing her beta blocker but she would prefer to continue with her present medicines at present. We will increase in the future if needed.

## 2014-03-15 ENCOUNTER — Other Ambulatory Visit: Payer: Self-pay | Admitting: Internal Medicine

## 2014-03-19 ENCOUNTER — Telehealth: Payer: Self-pay

## 2014-03-19 NOTE — Telephone Encounter (Signed)
Received paper note from front office staff, patient called and was complaining about dry mouth and the new RX. Patient was  put on Invokana 300 mg, she is currently taking 1/2 tablet, per Vicie Mutters, PA  Thinks the dry mouth is caused by dehydration and advises patient to be sure she is drinking plenty of water, she was advised to push more fluids and monitor. Call office if symptoms persist after increasing water/fluids.

## 2014-03-25 ENCOUNTER — Other Ambulatory Visit: Payer: Self-pay | Admitting: Internal Medicine

## 2014-03-27 ENCOUNTER — Other Ambulatory Visit: Payer: Self-pay | Admitting: Internal Medicine

## 2014-03-27 DIAGNOSIS — I10 Essential (primary) hypertension: Secondary | ICD-10-CM

## 2014-03-27 MED ORDER — METOPROLOL SUCCINATE ER 25 MG PO TB24: ORAL_TABLET | ORAL | Status: DC

## 2014-05-14 ENCOUNTER — Other Ambulatory Visit: Payer: Self-pay | Admitting: Physician Assistant

## 2014-06-03 ENCOUNTER — Ambulatory Visit (INDEPENDENT_AMBULATORY_CARE_PROVIDER_SITE_OTHER): Payer: BLUE CROSS/BLUE SHIELD | Admitting: Physician Assistant

## 2014-06-03 ENCOUNTER — Encounter: Payer: Self-pay | Admitting: Physician Assistant

## 2014-06-03 VITALS — BP 120/60 | HR 78 | Temp 98.2°F | Resp 16 | Ht 59.0 in | Wt 126.0 lb

## 2014-06-03 DIAGNOSIS — E119 Type 2 diabetes mellitus without complications: Secondary | ICD-10-CM

## 2014-06-03 DIAGNOSIS — D649 Anemia, unspecified: Secondary | ICD-10-CM

## 2014-06-03 DIAGNOSIS — Z79899 Other long term (current) drug therapy: Secondary | ICD-10-CM

## 2014-06-03 DIAGNOSIS — I10 Essential (primary) hypertension: Secondary | ICD-10-CM

## 2014-06-03 DIAGNOSIS — Q231 Congenital insufficiency of aortic valve: Secondary | ICD-10-CM

## 2014-06-03 DIAGNOSIS — E782 Mixed hyperlipidemia: Secondary | ICD-10-CM

## 2014-06-03 DIAGNOSIS — E559 Vitamin D deficiency, unspecified: Secondary | ICD-10-CM

## 2014-06-03 LAB — CBC WITH DIFFERENTIAL/PLATELET
Basophils Absolute: 0 10*3/uL (ref 0.0–0.1)
Basophils Relative: 0 % (ref 0–1)
Eosinophils Absolute: 0.1 10*3/uL (ref 0.0–0.7)
Eosinophils Relative: 1 % (ref 0–5)
HCT: 43 % (ref 36.0–46.0)
Hemoglobin: 13.8 g/dL (ref 12.0–15.0)
Lymphocytes Relative: 30 % (ref 12–46)
Lymphs Abs: 2.5 10*3/uL (ref 0.7–4.0)
MCH: 30.9 pg (ref 26.0–34.0)
MCHC: 32.1 g/dL (ref 30.0–36.0)
MCV: 96.2 fL (ref 78.0–100.0)
MPV: 10.8 fL (ref 8.6–12.4)
Monocytes Absolute: 0.4 10*3/uL (ref 0.1–1.0)
Monocytes Relative: 5 % (ref 3–12)
Neutro Abs: 5.2 10*3/uL (ref 1.7–7.7)
Neutrophils Relative %: 64 % (ref 43–77)
Platelets: 305 10*3/uL (ref 150–400)
RBC: 4.47 MIL/uL (ref 3.87–5.11)
RDW: 13.4 % (ref 11.5–15.5)
WBC: 8.2 10*3/uL (ref 4.0–10.5)

## 2014-06-03 LAB — HEMOGLOBIN A1C
Hgb A1c MFr Bld: 7.7 % — ABNORMAL HIGH (ref <5.7)
Mean Plasma Glucose: 174 mg/dL — ABNORMAL HIGH (ref <117)

## 2014-06-03 MED ORDER — TRAMADOL HCL 50 MG PO TABS: 50.0000 mg | ORAL_TABLET | ORAL | Status: DC | PRN

## 2014-06-03 MED ORDER — ARMODAFINIL 150 MG PO TABS: 150.0000 mg | ORAL_TABLET | ORAL | Status: DC | PRN

## 2014-06-03 MED ORDER — MELOXICAM 15 MG PO TABS: 15.0000 mg | ORAL_TABLET | ORAL | Status: DC | PRN

## 2014-06-03 NOTE — Progress Notes (Signed)
Assessment and Plan:  1. Hypertension -Continue medication, monitor blood pressure at home. Continue DASH diet.  Reminder to go to the ER if any CP, SOB, nausea, dizziness, severe HA, changes vision/speech, left arm numbness and tingling and jaw pain.  2. Cholesterol -Continue diet and exercise. Check cholesterol.   3. Diabetes without complications -Continue diet and exercise. Check A1C  4. Vitamin D Def - check level and continue medications.   5. Fatigue Fatigue- Discussed lifestyle modification as means of resolving problem, Training modifications discussed, See orders for lab evaluation, Discussed how depression can be a cause of fatigue if all labs are negative will discuss depression treatment.   6. Palpitations  check labs, r/u anemia, TSH, dehydration, electrolytes imbalance PPI for 2 weeks, then do Zantac, and continue BB, if worse follow up with [facility].  Increase water, decrease alcohol/caffeine, valsalva maneuvers taught   Continue diet and meds as discussed. Further disposition pending results of labs. Discussed med's effects and SE's.   Over 30 minutes of exam, counseling, chart review, and critical decision making was performed   HPI 59 y.o. female  presents for 3 month follow up on hypertension, cholesterol, diabetes and vitamin D deficiency.   Her blood pressure has been controlled at home, today her BP is BP: 120/60 mmHg. She follows with Dr. Stanford Breed for bicuspid valve, has a history of CVA in the past. Last Echo was 2014. Has non exertional, night time palpitations.   She does not workout. She denies chest pain, shortness of breath, dizziness.  She is on cholesterol medication and denies myalgias. Her cholesterol is at goal. The cholesterol was:   Lab Results  Component Value Date   CHOL 159 02/14/2014   HDL 49 02/14/2014   LDLCALC 84 02/14/2014   TRIG 131 02/14/2014   CHOLHDL 3.2 02/14/2014    She has been working on diet and exercise for diabetes with  other diabetic neurologic complication, she is on bASA, she is on ACE/ARB, she is on invokana 300mg  1/2 pill every day and metformin and denies paresthesia of the feet, polydipsia, polyuria and visual disturbances. She states her morning sugar has been as low as 80, no hypoglycemia with it, does not check post prandial. Last A1C was:  Lab Results  Component Value Date   HGBA1C 8.3* 02/14/2014  Patient is on Vitamin D supplement. Lab Results  Component Value Date   VD25OH 52 02/14/2014  She complains of fatigue but states she has not been taking her vitamins how she is suppose to. She has been taking her potassium pills 1 pill BID. She is on the iron and B complex. States that she sleeps fine.  BMI is Body mass index is 25.44 kg/(m^2)., she is working on diet and exercise. Wt Readings from Last 3 Encounters:  06/03/14 126 lb (57.153 kg)  02/25/14 130 lb 6.4 oz (59.149 kg)  02/14/14 134 lb (60.782 kg)     Current Medications:  Current Outpatient Prescriptions on File Prior to Visit  Medication Sig Dispense Refill  . Armodafinil (NUVIGIL) 150 MG tablet Take 1 tablet (150 mg total) by mouth as needed (when working). 90 tablet 0  . aspirin EC 81 MG tablet Take 81 mg by mouth daily.    . B Complex-C (SUPER B COMPLEX PO) Take 1 tablet by mouth daily. Takes every other day    . Cholecalciferol (VITAMIN D) 2000 UNITS CAPS Take 1 capsule by mouth 2 (two) times daily.    . cycloSPORINE (RESTASIS) 0.05 %  ophthalmic emulsion Place 1 drop into both eyes daily. prn    . DUREZOL 0.05 % EMUL   0  . ferrous sulfate 325 (65 FE) MG tablet Take 325 mg by mouth daily with breakfast.     . FIBER FORMULA PO Take 1 tablet by mouth daily.     . fluconazole (DIFLUCAN) 150 MG tablet Use once for yeast infection as needed 2 tablet 1  . fluticasone (FLONASE) 50 MCG/ACT nasal spray USE 1 SPRAY IN EACH NOSTRIL TWICE DAILY 16 g 2  . GMATE BLOOD GLUCOSE TEST test strip   10  . INVOKANA 300 MG TABS tablet TAKE 1 TABLET  BY MOUTH DAILY BEFORE BREAKFAST 30 tablet 0  . lisinopril-hydrochlorothiazide (PRINZIDE,ZESTORETIC) 10-12.5 MG per tablet TAKE 1 TABLET BY MOUTH EVERY MORNING 90 tablet 3  . MAGNESIUM PO Take 2 tablets by mouth daily.    . meloxicam (MOBIC) 15 MG tablet Take 15 mg by mouth as needed for pain.    . metFORMIN (GLUCOPHAGE XR) 500 MG 24 hr tablet Take 2 tabs 2 times daily with food for diabetes 360 tablet 3  . metoprolol succinate (TOPROL-XL) 25 MG 24 hr tablet TAKE 1 BY MOUTH DAILY 90 tablet 99  . Multiple Vitamin (MULITIVITAMIN WITH MINERALS) TABS Take 1 tablet by mouth daily.    . Omega-3 Fatty Acids (FISH OIL) 1000 MG CAPS Take 1 capsule (1,000 mg total) by mouth daily.  0  . OVER THE COUNTER MEDICATION 500 mg daily. Tumeric 500 mg daily    . OVER THE COUNTER MEDICATION Soft stool 1 daily    . potassium chloride SA (KLOR-CON M20) 20 MEQ tablet Take 1 tablet (20 mEq total) by mouth 3 (three) times daily. 270 tablet 1  . pravastatin (PRAVACHOL) 40 MG tablet Take 1 tablet (40 mg total) by mouth every other day. 90 tablet 2  . ranitidine (ZANTAC) 300 MG tablet TAKE 1 BY MOUTH DAILY 90 tablet 0  . traMADol (ULTRAM) 50 MG tablet Take 1 tablet (50 mg total) by mouth as needed. 90 tablet 0   No current facility-administered medications on file prior to visit.   Medical History:  Past Medical History  Diagnosis Date  . Diabetes mellitus   . Hypertension   . HYPERLIPIDEMIA   . CEREBROVASCULAR DISEASE   . BICUSPID AORTIC VALVE   . GERD   . DEGENERATIVE JOINT DISEASE   . ASTHMA   . Palpitation   . Vitamin D deficiency   . HSV-1 (herpes simplex virus 1) infection   . DJD (degenerative joint disease)   . Allergic rhinitis    Allergies:  Allergies  Allergen Reactions  . Naprosyn [Naproxen] Other (See Comments)    GI upset     Review of Systems:  Review of Systems  Constitutional: Positive for malaise/fatigue. Negative for fever, chills, weight loss and diaphoresis.  HENT: Negative.    Respiratory: Negative.   Cardiovascular: Positive for palpitations. Negative for chest pain, orthopnea, claudication, leg swelling and PND.  Gastrointestinal: Positive for heartburn. Negative for nausea, vomiting, abdominal pain, diarrhea, constipation, blood in stool and melena.  Genitourinary: Negative.   Musculoskeletal: Positive for myalgias and joint pain. Negative for back pain, falls and neck pain.  Skin: Negative.   Neurological: Negative.  Negative for weakness.  Psychiatric/Behavioral: Negative.     Family history- Review and unchanged Social history- Review and unchanged Physical Exam: BP 120/60 mmHg  Pulse 78  Temp(Src) 98.2 F (36.8 C) (Temporal)  Resp 16  Ht  4\' 11"  (1.499 m)  Wt 126 lb (57.153 kg)  BMI 25.44 kg/m2 Wt Readings from Last 3 Encounters:  06/03/14 126 lb (57.153 kg)  02/25/14 130 lb 6.4 oz (59.149 kg)  02/14/14 134 lb (60.782 kg)   General Appearance: Well nourished, in no apparent distress. Eyes: PERRLA, EOMs, conjunctiva no swelling or erythema Sinuses: No Frontal/maxillary tenderness ENT/Mouth: Ext aud canals clear, TMs without erythema, bulging. No erythema, swelling, or exudate on post pharynx.  Tonsils not swollen or erythematous. Hearing normal.  Neck: Supple, thyroid normal.  Respiratory: Respiratory effort normal, BS equal bilaterally without rales, rhonchi, wheezing or stridor.  Cardio: RRR with no MRGs. Brisk peripheral pulses without edema.  Abdomen: Soft, + BS.  Non tender, no guarding, rebound, hernias, masses. Lymphatics: Non tender without lymphadenopathy.  Musculoskeletal: Full ROM, 5/5 strength, Normal gait Skin: Warm, dry without rashes, lesions, ecchymosis.  Neuro: Cranial nerves intact. No cerebellar symptoms.  Psych: Awake and oriented X 3, normal affect, Insight and Judgment appropriate.    Vicie Mutters, PA-C 8:45 AM [facility]

## 2014-06-03 NOTE — Patient Instructions (Addendum)
Take the nexium samples x 5 days WITH the zantac still and then just do the zantac. To see if this helps your night time palpitations which could be esophageal spasm.   Before you even begin to attack a weight-loss plan, it pays to remember this: You are not fat. You have fat. Losing weight isn't about blame or shame; it's simply another achievement to accomplish. Dieting is like any other skill-you have to buckle down and work at it. As long as you act in a smart, reasonable way, you'll ultimately get where you want to be. Here are some weight loss pearls for you.  1. It's Not a Diet. It's a Lifestyle Thinking of a diet as something you're on and suffering through only for the short term doesn't work. To shed weight and keep it off, you need to make permanent changes to the way you eat. It's OK to indulge occasionally, of course, but if you cut calories temporarily and then revert to your old way of eating, you'll gain back the weight quicker than you can say yo-yo. Use it to lose it. Research shows that one of the best predictors of long-term weight loss is how many pounds you drop in the first month. For that reason, nutritionists often suggest being stricter for the first two weeks of your new eating strategy to build momentum. Cut out added sugar and alcohol and avoid unrefined carbs. After that, figure out how you can reincorporate them in a way that's healthy and maintainable.  2. There's a Right Way to Exercise Working out burns calories and fat and boosts your metabolism by building muscle. But those trying to lose weight are notorious for overestimating the number of calories they burn and underestimating the amount they take in. Unfortunately, your system is biologically programmed to hold on to extra pounds and that means when you start exercising, your body senses the deficit and ramps up its hunger signals. If you're not diligent, you'll eat everything you burn and then some. Use it to lose it.  Cardio gets all the exercise glory, but strength and interval training are the real heroes. They help you build lean muscle, which in turn increases your metabolism and calorie-burning ability 3. Don't Overreact to Mild Hunger Some people have a hard time losing weight because of hunger anxiety. To them, being hungry is bad-something to be avoided at all costs-so they carry snacks with them and eat when they don't need to. Others eat because they're stressed out or bored. While you never want to get to the point of being ravenous (that's when bingeing is likely to happen), a hunger pang, a craving, or the fact that it's 3:00 p.m. should not send you racing for the vending machine or obsessing about the energy bar in your purse. Ideally, you should put off eating until your stomach is growling and it's difficult to concentrate.  Use it to lose it. When you feel the urge to eat, use the HALT method. Ask yourself, Am I really hungry? Or am I angry or anxious, lonely or bored, or tired? If you're still not certain, try the apple test. If you're truly hungry, an apple should seem delicious; if it doesn't, something else is going on. Or you can try drinking water and making yourself busy, if you are still hungry try a healthy snack.  4. Not All Calories Are Created Equal The mechanics of weight loss are pretty simple: Take in fewer calories than you use for energy.  But the kind of food you eat makes all the difference. Processed food that's high in saturated fat and refined starch or sugar can cause inflammation that disrupts the hormone signals that tell your brain you're full. The result: You eat a lot more.  Use it to lose it. Clean up your diet. Swap in whole, unprocessed foods, including vegetables, lean protein, and healthy fats that will fill you up and give you the biggest nutritional bang for your calorie buck. In a few weeks, as your brain starts receiving regular hunger and fullness signals once again,  you'll notice that you feel less hungry overall and naturally start cutting back on the amount you eat.  5. Protein, Produce, and Plant-Based Fats Are Your Weight-Loss Trinity Here's why eating the three Ps regularly will help you drop pounds. Protein fills you up. You need it to build lean muscle, which keeps your metabolism humming so that you can torch more fat. People in a weight-loss program who ate double the recommended daily allowance for protein (about 110 grams for a 150-pound woman) lost 70 percent of their weight from fat, while people who ate the RDA lost only about 40 percent, one study found. Produce is packed with filling fiber. "It's very difficult to consume too many calories if you're eating a lot of vegetables. Example: Three cups of broccoli is a lot of food, yet only 93 calories. (Fruit is another story. It can be easy to overeat and can contain a lot of calories from sugar, so be sure to monitor your intake.) Plant-based fats like olive oil and those in avocados and nuts are healthy and extra satiating.  Use it to lose it. Aim to incorporate each of the three Ps into every meal and snack. People who eat protein throughout the day are able to keep weight off, according to a study in the [place] of Clinical Nutrition. In addition to meat, poultry and seafood, good sources are beans, lentils, eggs, tofu, and yogurt. As for fat, keep portion sizes in check by measuring out salad dressing, oil, and nut butters (shoot for one to two tablespoons). Finally, eat veggies or a little fruit at every meal. People who did that consumed 308 fewer calories but didn't feel any hungrier than when they didn't eat more produce.  7. How You Eat Is As Important As What You Eat In order for your brain to register that you're full, you need to focus on what you're eating. Sit down whenever you eat, preferably at a table. Turn off the TV or computer, put down your phone, and look at your food. Smell  it. Chew slowly, and don't put another bite on your fork until you swallow. When women ate lunch this attentively, they consumed 30 percent less when snacking later than those who listened to an audiobook at lunchtime, according to a study in the [place] of Nutrition. 8. Weighing Yourself Really Works The scale provides the best evidence about whether your efforts are paying off. Seeing the numbers tick up or down or stagnate is motivation to keep going-or to rethink your approach. A 2015 study at [facility] found that daily weigh-ins helped people lose more weight, keep it off, and maintain that loss, even after two years. Use it to lose it. Step on the scale at the same time every day for the best results. If your weight shoots up several pounds from one weigh-in to the next, don't freak out. Eating a lot of salt  the night before or having your period is the likely culprit. The number should return to normal in a day or two. It's a steady climb that you need to do something about. 9. Too Much Stress and Too Little Sleep Are Your Enemies When you're tired and frazzled, your body cranks up the production of cortisol, the stress hormone that can cause carb cravings. Not getting enough sleep also boosts your levels of ghrelin, a hormone associated with hunger, while suppressing leptin, a hormone that signals fullness and satiety. People on a diet who slept only five and a half hours a night for two weeks lost 55 percent less fat and were hungrier than those who slept eight and a half hours, according to a study in the [place]. Use it to lose it. Prioritize sleep, aiming for seven hours or more a night, which research shows helps lower stress. And make sure you're getting quality zzz's. If a snoring spouse or a fidgety cat wakes you up frequently throughout the night, you may end up getting the equivalent of just four hours of sleep, according to a study from [facility]. Keep pets out of the bedroom, and use a white-noise app to drown out snoring. 10. You Will Hit a plateau-And You Can Bust Through It As you slim down, your body releases much less leptin, the fullness hormone.  If you're not strength training, start right now. Building muscle can raise your metabolism to help you overcome a plateau. To keep your body challenged and burning calories, incorporate new moves and more intense intervals into your workouts or add another sweat session to your weekly routine. Alternatively, cut an extra 100 calories or so a day from your diet. Now that you've lost weight, your body simply doesn't need as much fuel.

## 2014-06-04 LAB — IRON AND TIBC
%SAT: 11 % — ABNORMAL LOW (ref 20–55)
Iron: 34 ug/dL — ABNORMAL LOW (ref 42–145)
TIBC: 323 ug/dL (ref 250–470)
UIBC: 289 ug/dL (ref 125–400)

## 2014-06-04 LAB — HEPATIC FUNCTION PANEL
ALT: 15 U/L (ref 0–35)
AST: 15 U/L (ref 0–37)
Albumin: 4.3 g/dL (ref 3.5–5.2)
Alkaline Phosphatase: 84 U/L (ref 39–117)
Bilirubin, Direct: <0.1 mg/dL (ref 0.0–0.3)
Total Bilirubin: 0.2 mg/dL (ref 0.2–1.2)
Total Protein: 7.2 g/dL (ref 6.0–8.3)

## 2014-06-04 LAB — VITAMIN B12: Vitamin B-12: 732 pg/mL (ref 211–911)

## 2014-06-04 LAB — FERRITIN: Ferritin: 34 ng/mL (ref 10–291)

## 2014-06-04 LAB — INSULIN, FASTING: Insulin fasting, serum: 5.4 u[IU]/mL (ref 2.0–19.6)

## 2014-06-04 LAB — BASIC METABOLIC PANEL WITH GFR
BUN: 16 mg/dL (ref 6–23)
CO2: 30 mEq/L (ref 19–32)
Calcium: 9.7 mg/dL (ref 8.4–10.5)
Chloride: 99 mEq/L (ref 96–112)
Creat: 0.5 mg/dL (ref 0.50–1.10)
GFR, Est African American: >89 mL/min
GFR, Est Non African American: >89 mL/min
Glucose, Bld: 90 mg/dL (ref 70–99)
Potassium: 4.9 mEq/L (ref 3.5–5.3)
Sodium: 140 mEq/L (ref 135–145)

## 2014-06-04 LAB — LIPID PANEL
Cholesterol: 158 mg/dL (ref 0–200)
HDL: 55 mg/dL (ref >=46)
LDL Cholesterol: 83 mg/dL (ref 0–99)
Total CHOL/HDL Ratio: 2.9 Ratio
Triglycerides: 98 mg/dL (ref <150)
VLDL: 20 mg/dL (ref 0–40)

## 2014-06-04 LAB — VITAMIN D 25 HYDROXY (VIT D DEFICIENCY, FRACTURES): Vit D, 25-Hydroxy: 72 ng/mL (ref 30–100)

## 2014-06-04 LAB — MAGNESIUM: Magnesium: 1.9 mg/dL (ref 1.5–2.5)

## 2014-06-04 LAB — TSH: TSH: 0.999 u[IU]/mL (ref 0.350–4.500)

## 2014-07-04 ENCOUNTER — Other Ambulatory Visit: Payer: Self-pay

## 2014-07-04 MED ORDER — RANITIDINE HCL 300 MG PO TABS: ORAL_TABLET | ORAL | Status: DC

## 2014-07-22 ENCOUNTER — Other Ambulatory Visit: Payer: Self-pay

## 2014-07-22 DIAGNOSIS — Z1231 Encounter for screening mammogram for malignant neoplasm of breast: Secondary | ICD-10-CM

## 2014-07-25 ENCOUNTER — Ambulatory Visit
Admission: RE | Admit: 2014-07-25 | Discharge: 2014-07-25 | Disposition: A | Discharge status: Home / Self Care | Payer: BLUE CROSS/BLUE SHIELD | Source: Ambulatory Visit

## 2014-07-25 DIAGNOSIS — Z1231 Encounter for screening mammogram for malignant neoplasm of breast: Secondary | ICD-10-CM

## 2014-07-30 ENCOUNTER — Encounter: Payer: Self-pay | Admitting: Internal Medicine

## 2014-09-24 ENCOUNTER — Other Ambulatory Visit: Payer: Self-pay | Admitting: Internal Medicine

## 2014-10-11 ENCOUNTER — Other Ambulatory Visit: Payer: Self-pay | Admitting: *Deleted

## 2014-10-11 ENCOUNTER — Ambulatory Visit (INDEPENDENT_AMBULATORY_CARE_PROVIDER_SITE_OTHER): Payer: BLUE CROSS/BLUE SHIELD | Admitting: Internal Medicine

## 2014-10-11 ENCOUNTER — Encounter: Payer: Self-pay | Admitting: Internal Medicine

## 2014-10-11 VITALS — BP 106/66 | HR 80 | Temp 97.9°F | Resp 16 | Ht <= 58 in | Wt 133.0 lb

## 2014-10-11 DIAGNOSIS — E559 Vitamin D deficiency, unspecified: Secondary | ICD-10-CM | POA: Diagnosis not present

## 2014-10-11 DIAGNOSIS — I10 Essential (primary) hypertension: Secondary | ICD-10-CM | POA: Diagnosis not present

## 2014-10-11 DIAGNOSIS — Z6827 Body mass index (BMI) 27.0-27.9, adult: Secondary | ICD-10-CM

## 2014-10-11 DIAGNOSIS — E119 Type 2 diabetes mellitus without complications: Secondary | ICD-10-CM | POA: Diagnosis not present

## 2014-10-11 DIAGNOSIS — Z79899 Other long term (current) drug therapy: Secondary | ICD-10-CM | POA: Diagnosis not present

## 2014-10-11 DIAGNOSIS — G4726 Circadian rhythm sleep disorder, shift work type: Secondary | ICD-10-CM

## 2014-10-11 DIAGNOSIS — R5383 Other fatigue: Secondary | ICD-10-CM

## 2014-10-11 DIAGNOSIS — Z0000 Encounter for general adult medical examination without abnormal findings: Secondary | ICD-10-CM

## 2014-10-11 DIAGNOSIS — K219 Gastro-esophageal reflux disease without esophagitis: Secondary | ICD-10-CM

## 2014-10-11 DIAGNOSIS — Z23 Encounter for immunization: Secondary | ICD-10-CM | POA: Diagnosis not present

## 2014-10-11 DIAGNOSIS — Z1212 Encounter for screening for malignant neoplasm of rectum: Secondary | ICD-10-CM

## 2014-10-11 DIAGNOSIS — Z111 Encounter for screening for respiratory tuberculosis: Secondary | ICD-10-CM | POA: Diagnosis not present

## 2014-10-11 DIAGNOSIS — E782 Mixed hyperlipidemia: Secondary | ICD-10-CM

## 2014-10-11 LAB — LIPID PANEL
Cholesterol: 148 mg/dL (ref 125–200)
HDL: 51 mg/dL (ref >=46)
LDL Cholesterol: 83 mg/dL (ref <130)
Total CHOL/HDL Ratio: 2.9 Ratio (ref <=5.0)
Triglycerides: 70 mg/dL (ref <150)
VLDL: 14 mg/dL (ref <30)

## 2014-10-11 LAB — BASIC METABOLIC PANEL WITH GFR
BUN: 16 mg/dL (ref 7–25)
CO2: 30 mmol/L (ref 20–31)
Calcium: 9.7 mg/dL (ref 8.6–10.4)
Chloride: 101 mmol/L (ref 98–110)
Creat: 0.5 mg/dL (ref 0.50–1.05)
GFR, Est African American: >89 mL/min (ref >=60)
GFR, Est Non African American: >89 mL/min (ref >=60)
Glucose, Bld: 78 mg/dL (ref 65–99)
Potassium: 4.5 mmol/L (ref 3.5–5.3)
Sodium: 141 mmol/L (ref 135–146)

## 2014-10-11 LAB — CBC WITH DIFFERENTIAL/PLATELET
Basophils Absolute: 0 10*3/uL (ref 0.0–0.1)
Basophils Relative: 0 % (ref 0–1)
Eosinophils Absolute: 0.1 10*3/uL (ref 0.0–0.7)
Eosinophils Relative: 1 % (ref 0–5)
HCT: 43.4 % (ref 36.0–46.0)
Hemoglobin: 13.7 g/dL (ref 12.0–15.0)
Lymphocytes Relative: 39 % (ref 12–46)
Lymphs Abs: 3 10*3/uL (ref 0.7–4.0)
MCH: 30.4 pg (ref 26.0–34.0)
MCHC: 31.6 g/dL (ref 30.0–36.0)
MCV: 96.2 fL (ref 78.0–100.0)
MPV: 10.5 fL (ref 8.6–12.4)
Monocytes Absolute: 0.5 10*3/uL (ref 0.1–1.0)
Monocytes Relative: 6 % (ref 3–12)
Neutro Abs: 4.1 10*3/uL (ref 1.7–7.7)
Neutrophils Relative %: 54 % (ref 43–77)
Platelets: 304 10*3/uL (ref 150–400)
RBC: 4.51 MIL/uL (ref 3.87–5.11)
RDW: 13.7 % (ref 11.5–15.5)
WBC: 7.6 10*3/uL (ref 4.0–10.5)

## 2014-10-11 LAB — HEPATIC FUNCTION PANEL
ALT: 14 U/L (ref 6–29)
AST: 15 U/L (ref 10–35)
Albumin: 4.2 g/dL (ref 3.6–5.1)
Alkaline Phosphatase: 79 U/L (ref 33–130)
Bilirubin, Direct: 0.1 mg/dL (ref <=0.2)
Indirect Bilirubin: 0.2 mg/dL (ref 0.2–1.2)
Total Bilirubin: 0.3 mg/dL (ref 0.2–1.2)
Total Protein: 7.2 g/dL (ref 6.1–8.1)

## 2014-10-11 LAB — IRON AND TIBC
%SAT: 12 % — ABNORMAL LOW (ref 20–55)
Iron: 45 ug/dL (ref 42–145)
TIBC: 361 ug/dL (ref 250–470)
UIBC: 316 ug/dL (ref 125–400)

## 2014-10-11 LAB — MAGNESIUM: Magnesium: 2 mg/dL (ref 1.5–2.5)

## 2014-10-11 LAB — VITAMIN B12: Vitamin B-12: 794 pg/mL (ref 211–911)

## 2014-10-11 LAB — URIC ACID: Uric Acid, Serum: 2.9 mg/dL (ref 2.4–7.0)

## 2014-10-11 LAB — TSH: TSH: 0.905 u[IU]/mL (ref 0.350–4.500)

## 2014-10-11 MED ORDER — METFORMIN HCL ER 500 MG PO TB24: ORAL_TABLET | ORAL | Status: DC

## 2014-10-11 NOTE — Progress Notes (Signed)
Patient ID: [name], female   DOB: 12-16-1955, 59 y.o.   MRN: [id number]   Comprehensive Examination  This very nice 59 y.o. WBF presents for complete physical.  Patient has been followed for HTN, T2_NIDDM, Hyperlipidemia, and Vitamin D Deficiency. Patient works 3rd shift at [employer] in [employer] and has difficulty with sleep/alert circadian cycles and had been on Nuvigil which she is now unable to afford dure to high co-pay.     HTN predates since 2003. Patient's BP has been controlled at home and patient denies any cardiac symptoms as chest pain, palpitations, shortness of breath, dizziness or ankle swelling. Today's BP: 106/66 mmHg    Patient's hyperlipidemia is controlled with diet and medications. Patient denies myalgias or other medication SE's. Today's lipids are at goal with  Cholesterol 148; HDL 51; LDL  83; Triglycerides 70.   Patient has T2_NIDDM predating since 1996 and patient denies reactive hypoglycemic symptoms, visual blurring, diabetic polys, or paresthesias. Today's  A1cis  7.2%..    Finally, patient has history of Vitamin D Deficiency and today's Vitamin D is  63.  Medication Sig  . Armodafinil (NUVIGIL) 150 MG tablet Take 1 tablet (150 mg total) by mouth as needed (when working).  [name] aspirin EC 81 MG tablet Take 81 mg by mouth daily.  . SUPER B COMPLEX  Take 1 tablet by mouth daily. Takes every other day  . VITAMIN D 2000 UNITS CAPS Take 1 capsule by mouth 2 (two) times daily.  . ferrous sulfate 325 (65 FE) MG tablet Take 325 mg by mouth daily with breakfast.   . FIBER FORMULA  Take 1 tablet by mouth daily.   . fluconazole150 MG tablet Use once for yeast infection as needed  . FLONASE nasal spray USE 1 SPRAY IN EACH NOSTRIL TWICE DAILY  . INVOKANA 300 MG TABS tablet TAKE 1 TABLET BY MOUTH DAILY BEFORE BREAKFAST  . lisinopril-hctz10-12.5 MG TAKE 1 TABLET BY MOUTH EVERY MORNING  . MAGNESIUM PO Take 2 tablets by mouth daily.  . meloxicam  15 MG tablet  Take 1 tablet (15 mg total) by mouth as needed for pain.  . metoprolol succinate (L-XL) 25 MG  TAKE 1 BY MOUTH DAILY  . MULITIVITAMIN WITH MINERALS Take 1 tablet by mouth daily.  [name] FISH OIL 1000 MG CAPS Take 1 capsule (1,000 mg total) by mouth daily.  . Tumeric 500 mg Take 1 cap daily  . Stool Softner   1 daily  . KCl (KLOR-CON) 20 MEQ tablet Take 1 tablet (20 mEq total) by mouth 3 (three) times daily.  . pravastatin  40 MG tablet TAKE 1 BY MOUTH EVERY OTHER DAY  . ranitidine  300 MG tablet TAKE 1 BY MOUTH DAILY  . traMADol  50 MG tablet Take 1 tablet (50 mg total) by mouth as needed.  . RESTASIS 0.05 % ophth emulsion Place 1 drop into both eyes daily. prn  . DUREZOL 0.05 % EMUL   . metFORMIN  XR 500 MG  Take 2 tabs 2 times daily with food for diabetes   Allergies  Allergen Reactions  . Naprosyn [Naproxen] Other (See Comments)    GI upset   Past Medical History  Diagnosis Date  . Diabetes mellitus   . Hypertension   . HYPERLIPIDEMIA   . CEREBROVASCULAR DISEASE   . BICUSPID AORTIC VALVE   . GERD   . DEGENERATIVE JOINT DISEASE   . ASTHMA   . Palpitation   . Vitamin D deficiency   .  HSV-1 (herpes simplex virus 1) infection   . DJD (degenerative joint disease)   . Allergic rhinitis    Health Maintenance  Topic Date Due  . PNEUMOCOCCAL POLYSACCHARIDE VACCINE (2) 05/16/2001  . TETANUS/TDAP  03/25/2014  . OPHTHALMOLOGY EXAM  08/25/2014  . INFLUENZA VACCINE  09/16/2014  . HEMOGLOBIN A1C  04/13/2015  . FOOT EXAM  10/11/2015  . URINE MICROALBUMIN  10/11/2015  . MAMMOGRAM  07/24/2016  . COLONOSCOPY  01/30/2020  . Hepatitis C Screening  Completed  . HIV Screening  Completed   Immunization History  Administered Date(s) Administered  . DT 10/11/2014  . DTaP 03/18/2004  . Influenza Split 11/15/2013  . Influenza Whole 11/02/2012  . PPD Test 07/30/2013, 10/11/2014  . Pneumococcal Conjugate-13 11/15/2013  . Pneumococcal Polysaccharide-23 05/16/1996   Past Surgical History   Procedure Laterality Date  . Repair of trigger finger    . Abdominal hysterectomy     Family History  Problem Relation Age of Onset  . Coronary artery disease Mother   . Heart disease Mother   . Hypertension Mother   . Diabetes Father   . Hypertension Father    Social History  Substance Use Topics  . Smoking status: Never Smoker   . Smokeless tobacco: None  . Alcohol Use: Yes     Comment: very rarely    ROS Constitutional: Denies fever, chills, weight loss/gain, headaches, insomnia,  night sweats, and change in appetite. Does c/o fatigue. Eyes: Denies redness, blurred vision, diplopia, discharge, itchy, watery eyes.  ENT: Denies discharge, congestion, post nasal drip, epistaxis, sore throat, earache, hearing loss, dental pain, Tinnitus, Vertigo, Sinus pain, snoring.  Cardio: Denies chest pain, palpitations, irregular heartbeat, syncope, dyspnea, diaphoresis, orthopnea, PND, claudication, edema Respiratory: denies cough, dyspnea, DOE, pleurisy, hoarseness, laryngitis, wheezing.  Gastrointestinal: Denies dysphagia, heartburn, reflux, water brash, pain, cramps, nausea, vomiting, bloating, diarrhea, constipation, hematemesis, melena, hematochezia, jaundice, hemorrhoids Genitourinary: Denies dysuria, frequency, urgency, nocturia, hesitancy, discharge, hematuria, flank pain Breast: Breast lumps, nipple discharge, bleeding.  Musculoskeletal: Denies arthralgia, myalgia, stiffness, Jt. Swelling, pain, limp, and strain/sprain. Denies falls. Skin: Denies puritis, rash, hives, warts, acne, eczema, changing in skin lesion Neuro: No weakness, tremor, incoordination, spasms, paresthesia, pain Psychiatric: Denies confusion, memory loss, sensory loss. Denies Depression. Endocrine: Denies change in weight, skin, hair change, nocturia, and paresthesia, diabetic polys, visual blurring, hyper / hypo glycemic episodes.  Heme/Lymph: No excessive bleeding, bruising, enlarged lymph nodes.  Physical  Exam  BP 106/66   Pulse 80  Temp 97.9 F Resp 16  Ht 4\' 10"    Wt 133 lb     BMI 27.80   General Appearance: Well nourished and in no apparent distress. Eyes: PERRLA, EOMs, conjunctiva no swelling or erythema, normal fundi and vessels. Sinuses: No frontal/maxillary tenderness ENT/Mouth: EACs patent / TMs  nl. Nares clear without erythema, swelling, mucoid exudates. Oral hygiene is good. No erythema, swelling, or exudate. Tongue normal, non-obstructing. Tonsils not swollen or erythematous. Hearing normal.  Neck: Supple, thyroid normal. No bruits, nodes or JVD. Respiratory: Respiratory effort normal.  BS equal and clear bilateral without rales, rhonci, wheezing or stridor. Cardio: Heart sounds are normal with regular rate and rhythm and no murmurs, rubs or gallops. Peripheral pulses are normal and equal bilaterally without edema. No aortic or femoral bruits. Chest: symmetric with normal excursions and percussion. Breasts: Symmetric, without lumps, nipple discharge, retractions, or fibrocystic changes.  Abdomen: Flat, soft, with bowel sounds. Nontender, no guarding, rebound, hernias, masses, or organomegaly.  Lymphatics: Non tender without lymphadenopathy.  Musculoskeletal: Full ROM all peripheral extremities, joint stability, 5/5 strength, and normal gait. Skin: Warm and dry without rashes, lesions, cyanosis, clubbing or  ecchymosis.  Neuro: Cranial nerves intact, reflexes equal bilaterally. Normal muscle tone, no cerebellar symptoms. Sensation intact by Monofilament testing to the toes bilaterally.  Pysch: Awake and oriented X 3, normal affect, Insight and Judgment appropriate.   Assessment and Plan  1. Essential hypertension  - EKG 12-Lead - [place], RETROPERITNL ABD,  LTD - TSH  2. Hyperlipidemia  - Lipid panel  3. Type 2 diabetes mellitus without complication  - Microalbumin / creatinine urine ratio - HM DIABETES FOOT EXAM - LOW EXTREMITY NEUR EXAM DOCUM - Hemoglobin A1c -  Insulin, random - metFORMIN (GLUCOPHAGE XR) 500 MG 24 hr tablet; Take 2 tabs 2 times daily with food for diabetes  Dispense: 360 tablet; Refill: 3  4. Vitamin D deficiency  - Vit D  25 hydroxy   5. Gastroesophageal reflux diseased   6. Screening for rectal cancer  - POC Hemoccult Bld/Stl   7. Other fatigue  - Vitamin B12 - Iron and TIBC - TSH  8. Screening examination for pulmonary tuberculosis  - PPD  9. Need for prophylactic vaccination with tetanus-diphtheria (TD)  - DT Vaccine greater than 7yo IM  10. Medication management  - Urine Microscopic - Uric acid - CBC with Differential/Platelet - BASIC METABOLIC PANEL WITH GFR - Hepatic function panel - Magnesium  11. BMI 27.0-27.9,adult  13. Sleep / Shift work Disorder  - D/C Nuvigil due to high cost and try on Ritalin 10 mg Rx written.   Continue prudent diet as discussed, weight control, BP monitoring, regular exercise, and medications. Discussed med's effects and SE's. Screening labs and tests as requested with regular follow-up as recommended.  Over 40 minutes of exam, counseling, chart review was performed.

## 2014-10-11 NOTE — Patient Instructions (Signed)

## 2014-10-12 LAB — URINALYSIS, MICROSCOPIC ONLY
Casts: NONE SEEN [LPF]
Crystals: NONE SEEN [HPF]
RBC / HPF: NONE SEEN RBC/HPF (ref <=2)
Squamous Epithelial / LPF: NONE SEEN [HPF] (ref <=5)
Yeast: NONE SEEN [HPF]

## 2014-10-12 LAB — MICROALBUMIN / CREATININE URINE RATIO
Creatinine, Urine: 78.5 mg/dL
Microalb Creat Ratio: 5.1 mg/g (ref 0.0–30.0)
Microalb, Ur: 0.4 mg/dL (ref <2.0)

## 2014-10-12 LAB — HEMOGLOBIN A1C
Hgb A1c MFr Bld: 7.2 % — ABNORMAL HIGH (ref <5.7)
Mean Plasma Glucose: 160 mg/dL — ABNORMAL HIGH (ref <117)

## 2014-10-12 LAB — INSULIN, RANDOM: Insulin: 3.9 u[IU]/mL (ref 2.0–19.6)

## 2014-10-12 LAB — VITAMIN D 25 HYDROXY (VIT D DEFICIENCY, FRACTURES): Vit D, 25-Hydroxy: 61 ng/mL (ref 30–100)

## 2014-10-13 ENCOUNTER — Encounter: Payer: Self-pay | Admitting: Internal Medicine

## 2014-10-13 ENCOUNTER — Other Ambulatory Visit: Payer: Self-pay | Admitting: Internal Medicine

## 2014-10-13 DIAGNOSIS — G4726 Circadian rhythm sleep disorder, shift work type: Secondary | ICD-10-CM | POA: Insufficient documentation

## 2014-10-13 DIAGNOSIS — J309 Allergic rhinitis, unspecified: Secondary | ICD-10-CM

## 2014-10-13 DIAGNOSIS — N3090 Cystitis, unspecified without hematuria: Secondary | ICD-10-CM

## 2014-10-13 MED ORDER — MOMETASONE FUROATE 50 MCG/ACT NA SUSP: 2.0000 | Freq: Every day | NASAL | Status: DC

## 2014-10-13 MED ORDER — CIPROFLOXACIN HCL 250 MG PO TABS: ORAL_TABLET | ORAL | Status: AC

## 2014-10-14 LAB — TB SKIN TEST
Induration: 0 mm
TB Skin Test: NEGATIVE

## 2014-10-17 ENCOUNTER — Other Ambulatory Visit: Payer: Self-pay | Admitting: *Deleted

## 2014-10-17 MED ORDER — POTASSIUM CHLORIDE CRYS ER 20 MEQ PO TBCR: 20.0000 meq | EXTENDED_RELEASE_TABLET | Freq: Three times a day (TID) | ORAL | Status: DC

## 2014-11-06 ENCOUNTER — Other Ambulatory Visit: Payer: Self-pay | Admitting: *Deleted

## 2014-11-06 DIAGNOSIS — Z1212 Encounter for screening for malignant neoplasm of rectum: Secondary | ICD-10-CM

## 2014-11-06 LAB — POC HEMOCCULT BLD/STL (HOME/3-CARD/SCREEN)
Card #2 Fecal Occult Blod, POC: NEGATIVE
Card #3 Fecal Occult Blood, POC: NEGATIVE
Fecal Occult Blood, POC: NEGATIVE

## 2014-11-14 ENCOUNTER — Other Ambulatory Visit: Payer: BLUE CROSS/BLUE SHIELD

## 2014-11-14 DIAGNOSIS — N390 Urinary tract infection, site not specified: Secondary | ICD-10-CM | POA: Diagnosis not present

## 2014-11-14 DIAGNOSIS — N3090 Cystitis, unspecified without hematuria: Secondary | ICD-10-CM

## 2014-11-15 LAB — URINALYSIS, MICROSCOPIC ONLY
Bacteria, UA: NONE SEEN [HPF]
Casts: NONE SEEN [LPF]
Crystals: NONE SEEN [HPF]
RBC / HPF: NONE SEEN RBC/HPF (ref <=2)
Squamous Epithelial / LPF: NONE SEEN [HPF] (ref <=5)
Yeast: NONE SEEN [HPF]

## 2014-11-15 LAB — URINE CULTURE
Colony Count: NO GROWTH
Organism ID, Bacteria: NO GROWTH

## 2014-12-29 ENCOUNTER — Other Ambulatory Visit: Payer: Self-pay | Admitting: Internal Medicine

## 2014-12-30 ENCOUNTER — Other Ambulatory Visit: Payer: Self-pay | Admitting: Internal Medicine

## 2015-01-20 ENCOUNTER — Encounter: Payer: Self-pay | Admitting: Internal Medicine

## 2015-01-20 ENCOUNTER — Ambulatory Visit (INDEPENDENT_AMBULATORY_CARE_PROVIDER_SITE_OTHER): Payer: BLUE CROSS/BLUE SHIELD | Admitting: Internal Medicine

## 2015-01-20 VITALS — BP 124/76 | HR 80 | Temp 98.3°F | Resp 16 | Ht <= 58 in | Wt 131.8 lb

## 2015-01-20 DIAGNOSIS — E559 Vitamin D deficiency, unspecified: Secondary | ICD-10-CM

## 2015-01-20 DIAGNOSIS — E782 Mixed hyperlipidemia: Secondary | ICD-10-CM

## 2015-01-20 DIAGNOSIS — E119 Type 2 diabetes mellitus without complications: Secondary | ICD-10-CM | POA: Diagnosis not present

## 2015-01-20 DIAGNOSIS — Z79899 Other long term (current) drug therapy: Secondary | ICD-10-CM | POA: Diagnosis not present

## 2015-01-20 DIAGNOSIS — M180 Bilateral primary osteoarthritis of first carpometacarpal joints: Secondary | ICD-10-CM

## 2015-01-20 DIAGNOSIS — I872 Venous insufficiency (chronic) (peripheral): Secondary | ICD-10-CM | POA: Diagnosis not present

## 2015-01-20 DIAGNOSIS — I10 Essential (primary) hypertension: Secondary | ICD-10-CM

## 2015-01-20 LAB — CBC WITH DIFFERENTIAL/PLATELET
Basophils Absolute: 0 10*3/uL (ref 0.0–0.1)
Basophils Relative: 0 % (ref 0–1)
Eosinophils Absolute: 0.1 10*3/uL (ref 0.0–0.7)
Eosinophils Relative: 1 % (ref 0–5)
HCT: 43.2 % (ref 36.0–46.0)
Hemoglobin: 14.5 g/dL (ref 12.0–15.0)
Lymphocytes Relative: 31 % (ref 12–46)
Lymphs Abs: 2.8 10*3/uL (ref 0.7–4.0)
MCH: 31.3 pg (ref 26.0–34.0)
MCHC: 33.6 g/dL (ref 30.0–36.0)
MCV: 93.3 fL (ref 78.0–100.0)
MPV: 10.7 fL (ref 8.6–12.4)
Monocytes Absolute: 0.6 10*3/uL (ref 0.1–1.0)
Monocytes Relative: 7 % (ref 3–12)
Neutro Abs: 5.6 10*3/uL (ref 1.7–7.7)
Neutrophils Relative %: 61 % (ref 43–77)
Platelets: 299 10*3/uL (ref 150–400)
RBC: 4.63 MIL/uL (ref 3.87–5.11)
RDW: 14 % (ref 11.5–15.5)
WBC: 9.1 10*3/uL (ref 4.0–10.5)

## 2015-01-20 LAB — HEMOGLOBIN A1C
Hgb A1c MFr Bld: 7.3 % — ABNORMAL HIGH (ref <5.7)
Mean Plasma Glucose: 163 mg/dL — ABNORMAL HIGH (ref <117)

## 2015-01-20 LAB — HEPATIC FUNCTION PANEL
ALT: 14 U/L (ref 6–29)
AST: 15 U/L (ref 10–35)
Albumin: 4.5 g/dL (ref 3.6–5.1)
Alkaline Phosphatase: 80 U/L (ref 33–130)
Bilirubin, Direct: <0.1 mg/dL (ref <=0.2)
Total Bilirubin: 0.3 mg/dL (ref 0.2–1.2)
Total Protein: 7.2 g/dL (ref 6.1–8.1)

## 2015-01-20 LAB — BASIC METABOLIC PANEL WITH GFR
BUN: 14 mg/dL (ref 7–25)
CO2: 30 mmol/L (ref 20–31)
Calcium: 10.2 mg/dL (ref 8.6–10.4)
Chloride: 96 mmol/L — ABNORMAL LOW (ref 98–110)
Creat: 0.65 mg/dL (ref 0.50–1.05)
GFR, Est African American: >89 mL/min (ref >=60)
GFR, Est Non African American: >89 mL/min (ref >=60)
Glucose, Bld: 97 mg/dL (ref 65–99)
Potassium: 4.1 mmol/L (ref 3.5–5.3)
Sodium: 141 mmol/L (ref 135–146)

## 2015-01-20 LAB — LIPID PANEL
Cholesterol: 167 mg/dL (ref 125–200)
HDL: 51 mg/dL (ref >=46)
LDL Cholesterol: 102 mg/dL (ref <130)
Total CHOL/HDL Ratio: 3.3 Ratio (ref <=5.0)
Triglycerides: 70 mg/dL (ref <150)
VLDL: 14 mg/dL (ref <30)

## 2015-01-20 LAB — TSH: TSH: 1.357 u[IU]/mL (ref 0.350–4.500)

## 2015-01-20 LAB — MAGNESIUM: Magnesium: 1.9 mg/dL (ref 1.5–2.5)

## 2015-01-20 NOTE — Progress Notes (Signed)
Patient ID: [name], female   DOB: 05-20-1955, 59 y.o.   MRN: [id number]  Assessment and Plan:  Hypertension:  -Continue medication -monitor blood pressure at home. -Continue DASH diet -Reminder to go to the ER if any CP, SOB, nausea, dizziness, severe HA, changes vision/speech, left arm numbness and tingling and jaw pain.  Cholesterol - Continue diet and exercise -Check cholesterol.   Diabetes without complications -Continue diet and exercise.  -Check A1C -possible neuropathyVitamin D Def -check level -continue medications.   Leg swelling -none on exam -compression stockings -leg pain neuropathy vs. Venous insufficiency -if no relief with stocking will try using gabapentin and getting diabetes under tighter control  Continue diet and meds as discussed. Further disposition pending results of labs. Discussed med's effects and SE's.    HPI 59 y.o. female  presents for 3 month follow up with hypertension, hyperlipidemia, diabetes and vitamin D deficiency.   Her blood pressure has been controlled at home, today their BP is BP: 124/76 mmHg.She does workout. She denies chest pain, shortness of breath, dizziness.   She is on cholesterol medication and denies myalgias. Her cholesterol is at goal. The cholesterol was:  10/11/2014: Cholesterol 148; HDL 51; LDL Cholesterol 83; Triglycerides 70   She has been working on diet and exercise for diabetes without complications, she is on bASA, she is on ACE/ARB, and denies  foot ulcerations, hyperglycemia, hypoglycemia , increased appetite, nausea, paresthesia of the feet, polydipsia, polyuria, visual disturbances, vomiting and weight loss. Last A1C was: 10/11/2014: Hgb A1c MFr Bld 7.2*  She has not been checking her blood sugars at home.  She reports that she doesn't like getting stuck with needles.  She reports that she isn't sure she can get over this.   Patient is on Vitamin D supplement. 10/11/2014: Vit D, 25-Hydroxy 61  Patient  reports that she is having a lot of pain in her hands and her feet.  She reports that this has been going on some time. She reports that the pain in her hands is at the base of the bilateral thumbs and she says she spoke to the orthopedist who told her that this was coming from arthritis.  The pain in her legs is burning and intermittent.  She also reports that she has been having some swelling in her legs after being on her feet.  Swelling is gone after laying down.  She has never had swelling before.     Current Medications:  Current Outpatient Prescriptions on File Prior to Visit  Medication Sig Dispense Refill  . Armodafinil (NUVIGIL) 150 MG tablet Take 1 tablet (150 mg total) by mouth as needed (when working). 90 tablet 0  . aspirin EC 81 MG tablet Take 81 mg by mouth daily.    . B Complex-C (SUPER B COMPLEX PO) Take 1 tablet by mouth daily. Takes every other day    . Cholecalciferol (VITAMIN D) 2000 UNITS CAPS Take 1 capsule by mouth 2 (two) times daily.    . ferrous sulfate 325 (65 FE) MG tablet Take 325 mg by mouth daily with breakfast.     . FIBER FORMULA PO Take 1 tablet by mouth daily.     . fluconazole (DIFLUCAN) 150 MG tablet Use once for yeast infection as needed 2 tablet 1  . GMATE BLOOD GLUCOSE TEST test strip   10  . INVOKANA 300 MG TABS tablet TAKE 1 TABLET BY MOUTH DAILY BEFORE BREAKFAST 30 tablet 0  . lisinopril-hydrochlorothiazide (PRINZIDE,ZESTORETIC) 10-12.5 MG tablet  TAKE 1 TABLET BY MOUTH EVERY MORNING 90 tablet 1  . MAGNESIUM PO Take 2 tablets by mouth daily.    . meloxicam (MOBIC) 15 MG tablet Take 1 tablet (15 mg total) by mouth as needed for pain. 90 tablet 1  . metFORMIN (GLUCOPHAGE XR) 500 MG 24 hr tablet Take 2 tabs 2 times daily with food for diabetes 360 tablet 3  . metoprolol succinate (TOPROL-XL) 25 MG 24 hr tablet TAKE 1 BY MOUTH DAILY 90 tablet 99  . mometasone (NASONEX) 50 MCG/ACT nasal spray Place 2 sprays into the nose daily. 51 g 99  . Multiple Vitamin  (MULITIVITAMIN WITH MINERALS) TABS Take 1 tablet by mouth daily.    . Omega-3 Fatty Acids (FISH OIL) 1000 MG CAPS Take 1 capsule (1,000 mg total) by mouth daily.  0  . OVER THE COUNTER MEDICATION 500 mg daily. Tumeric 500 mg daily    . OVER THE COUNTER MEDICATION Soft stool 1 daily    . potassium chloride SA (KLOR-CON M20) 20 MEQ tablet Take 1 tablet (20 mEq total) by mouth 3 (three) times daily. 270 tablet 1  . pravastatin (PRAVACHOL) 40 MG tablet TAKE 1 BY MOUTH EVERY OTHER DAY 45 tablet 3  . ranitidine (ZANTAC) 300 MG tablet TAKE 1 BY MOUTH DAILY 90 tablet PRN  . traMADol (ULTRAM) 50 MG tablet Take 1 tablet (50 mg total) by mouth as needed. 90 tablet 0   No current facility-administered medications on file prior to visit.   Medical History:  Past Medical History  Diagnosis Date  . Diabetes mellitus   . Hypertension   . HYPERLIPIDEMIA   . CEREBROVASCULAR DISEASE   . BICUSPID AORTIC VALVE   . GERD   . DEGENERATIVE JOINT DISEASE   . ASTHMA   . Palpitation   . Vitamin D deficiency   . HSV-1 (herpes simplex virus 1) infection   . DJD (degenerative joint disease)   . Allergic rhinitis    Allergies:  Allergies  Allergen Reactions  . Naprosyn [Naproxen] Other (See Comments)    GI upset     Review of Systems:  Review of Systems  Constitutional: Negative for fever, chills and malaise/fatigue.  HENT: Negative for congestion, ear pain and sore throat.   Respiratory: Negative for cough, shortness of breath and wheezing.   Cardiovascular: Positive for leg swelling. Negative for chest pain and palpitations.  Gastrointestinal: Positive for diarrhea. Negative for heartburn, constipation, blood in stool and melena.  Genitourinary: Negative.   Musculoskeletal: Positive for joint pain.  Skin: Negative.   Neurological: Negative for dizziness, sensory change, loss of consciousness and headaches.  Psychiatric/Behavioral: Negative for depression. The patient is not nervous/anxious and does  not have insomnia.     Family history- Review and unchanged  Social history- Review and unchanged  Physical Exam: BP 124/76 mmHg  Pulse 80  Temp(Src) 98.3 F (36.8 C)  Resp 16  Ht 4\' 10"  (1.473 m)  Wt 131 lb 12.8 oz (59.784 kg)  BMI 27.55 kg/m2 Wt Readings from Last 3 Encounters:  01/20/15 131 lb 12.8 oz (59.784 kg)  10/11/14 133 lb (60.328 kg)  06/03/14 126 lb (57.153 kg)   General Appearance: Well nourished well developed, non-toxic appearing, in no apparent distress. Eyes: PERRLA, EOMs, conjunctiva no swelling or erythema ENT/Mouth: Ear canals clear with no erythema, swelling, or discharge.  TMs normal bilaterally, oropharynx clear, moist, with no exudate.   Neck: Supple, thyroid normal, no JVD, no cervical adenopathy.  Respiratory: Respiratory effort normal,  breath sounds clear A&P, no wheeze, rhonchi or rales noted.  No retractions, no accessory muscle usage Cardio: RRR with no MRGs. No noted edema.  Abdomen: Soft, + BS.  Non tender, no guarding, rebound, hernias, masses. Musculoskeletal: Full ROM, 5/5 strength, Normal gait.  Bilateral positive [facility] grind test.  Tenderness at [facility] Skin: Warm, dry without rashes, lesions, ecchymosis.  Neuro: Awake and oriented X 3, Cranial nerves intact. No cerebellar symptoms.  Psych: normal affect, Insight and Judgment appropriate.    Starlyn Skeans, PA-C 9:01 AM [facility]

## 2015-01-21 LAB — VITAMIN D 25 HYDROXY (VIT D DEFICIENCY, FRACTURES): Vit D, 25-Hydroxy: 68 ng/mL (ref 30–100)

## 2015-01-21 LAB — INSULIN, RANDOM: Insulin: 5.3 u[IU]/mL (ref 2.0–19.6)

## 2015-02-24 NOTE — Progress Notes (Signed)
HPI: FU abnormal electrocardiogram, bicuspid aortic valve and palpitations. The patient had a Myoview performed on Jun 16, 2007, that showed no ischemia or infarction. The ejection fraction was 74%. She also had carotid Dopplers performed last in March of 2012 and showed 0% to 39% bilateral stenosis. She had a previous CardioNet that showed no significant arrhythmias. Note, she did not have palpitations during that time. Echocardiogram was performed in March of 2014 and showed normal LV function, no AS or AI, cannot exclude bicuspid valve. Since last seen,   Current Outpatient Prescriptions  Medication Sig Dispense Refill  . Armodafinil (NUVIGIL) 150 MG tablet Take 1 tablet (150 mg total) by mouth as needed (when working). 90 tablet 0  . aspirin EC 81 MG tablet Take 81 mg by mouth daily.    . B Complex-C (SUPER B COMPLEX PO) Take 1 tablet by mouth daily. Takes every other day    . Cholecalciferol (VITAMIN D) 2000 UNITS CAPS Take 1 capsule by mouth 2 (two) times daily.    . ferrous sulfate 325 (65 FE) MG tablet Take 325 mg by mouth daily with breakfast.     . FIBER FORMULA PO Take 1 tablet by mouth daily.     . fluconazole (DIFLUCAN) 150 MG tablet Use once for yeast infection as needed 2 tablet 1  . GMATE BLOOD GLUCOSE TEST test strip   10  . INVOKANA 300 MG TABS tablet TAKE 1 TABLET BY MOUTH DAILY BEFORE BREAKFAST 30 tablet 0  . lisinopril-hydrochlorothiazide (PRINZIDE,ZESTORETIC) 10-12.5 MG tablet TAKE 1 TABLET BY MOUTH EVERY MORNING 90 tablet 1  . MAGNESIUM PO Take 2 tablets by mouth daily.    . meloxicam (MOBIC) 15 MG tablet Take 1 tablet (15 mg total) by mouth as needed for pain. 90 tablet 1  . metFORMIN (GLUCOPHAGE XR) 500 MG 24 hr tablet Take 2 tabs 2 times daily with food for diabetes 360 tablet 3  . metoprolol succinate (TOPROL-XL) 25 MG 24 hr tablet TAKE 1 BY MOUTH DAILY 90 tablet 99  . mometasone (NASONEX) 50 MCG/ACT nasal spray Place 2 sprays into the nose daily. 51 g 99  .  Multiple Vitamin (MULITIVITAMIN WITH MINERALS) TABS Take 1 tablet by mouth daily.    . Omega-3 Fatty Acids (FISH OIL) 1000 MG CAPS Take 1 capsule (1,000 mg total) by mouth daily.  0  . OVER THE COUNTER MEDICATION 500 mg daily. Tumeric 500 mg daily    . OVER THE COUNTER MEDICATION Soft stool 1 daily    . potassium chloride SA (KLOR-CON M20) 20 MEQ tablet Take 1 tablet (20 mEq total) by mouth 3 (three) times daily. 270 tablet 1  . pravastatin (PRAVACHOL) 40 MG tablet TAKE 1 BY MOUTH EVERY OTHER DAY 45 tablet 3  . ranitidine (ZANTAC) 300 MG tablet TAKE 1 BY MOUTH DAILY 90 tablet PRN  . traMADol (ULTRAM) 50 MG tablet Take 1 tablet (50 mg total) by mouth as needed. 90 tablet 0   No current facility-administered medications for this visit.     Past Medical History  Diagnosis Date  . Diabetes mellitus   . Hypertension   . HYPERLIPIDEMIA   . CEREBROVASCULAR DISEASE   . BICUSPID AORTIC VALVE   . GERD   . DEGENERATIVE JOINT DISEASE   . ASTHMA   . Palpitation   . Vitamin D deficiency   . HSV-1 (herpes simplex virus 1) infection   . DJD (degenerative joint disease)   . Allergic rhinitis  Past Surgical History  Procedure Laterality Date  . Repair of trigger finger    . Abdominal hysterectomy      Social History   Social History  . Marital Status: Widowed    Spouse Name: N/A  . Number of Children: N/A  . Years of Education: N/A   Occupational History  . Not on file.   Social History Main Topics  . Smoking status: Never Smoker   . Smokeless tobacco: Not on file  . Alcohol Use: Yes     Comment: very rarely  . Drug Use: No  . Sexual Activity: Not Currently   Other Topics Concern  . Not on file   Social History Narrative    ROS: no fevers or chills, productive cough, hemoptysis, dysphasia, odynophagia, melena, hematochezia, dysuria, hematuria, rash, seizure activity, orthopnea, PND, pedal edema, claudication. Remaining systems are negative.  Physical  Exam: Well-developed well-nourished in no acute distress.  Skin is warm and dry.  HEENT is normal.  Neck is supple.  Chest is clear to auscultation with normal expansion.  Cardiovascular exam is regular rate and rhythm.  Abdominal exam nontender or distended. No masses palpated. Extremities show no edema. neuro grossly intact  ECG     This encounter was created in error - please disregard.

## 2015-02-25 ENCOUNTER — Encounter: Payer: BLUE CROSS/BLUE SHIELD | Admitting: Cardiology

## 2015-03-08 ENCOUNTER — Encounter: Payer: Self-pay | Admitting: *Deleted

## 2015-04-05 ENCOUNTER — Encounter: Payer: Self-pay | Admitting: *Deleted

## 2015-04-10 NOTE — Progress Notes (Signed)
HPI: FU abnormal electrocardiogram, bicuspid aortic valve and palpitations. The patient had a Myoview performed on Jun 16, 2007, that showed no ischemia or infarction. The ejection fraction was 74%. She also had carotid Dopplers performed last in March of 2012 and showed 0% to 39% bilateral stenosis. She had a previous CardioNet that showed no significant arrhythmias. Note, she did not have palpitations during that time. Echocardiogram was performed in March of 2014 and showed normal LV function, no AS or AI, cannot exclude bicuspid valve. Since last seen, She denies dyspnea on exertion, orthopnea, PND, palpitations, syncope or chest pain. Occasional minimal pedal edema.  Current Outpatient Prescriptions  Medication Sig Dispense Refill  . aspirin EC 81 MG tablet Take 81 mg by mouth daily.    . B Complex-C (SUPER B COMPLEX PO) Take 1 tablet by mouth daily. Takes every other day    . Cholecalciferol (VITAMIN D) 2000 UNITS CAPS Take 1 capsule by mouth 2 (two) times daily.    . ferrous sulfate 325 (65 FE) MG tablet Take 325 mg by mouth daily with breakfast.     . FIBER FORMULA PO Take 1 tablet by mouth daily.     . fluconazole (DIFLUCAN) 150 MG tablet Use once for yeast infection as needed 2 tablet 1  . GMATE BLOOD GLUCOSE TEST test strip   10  . INVOKANA 300 MG TABS tablet TAKE 1 TABLET BY MOUTH DAILY BEFORE BREAKFAST 30 tablet 0  . lisinopril-hydrochlorothiazide (PRINZIDE,ZESTORETIC) 10-12.5 MG tablet TAKE 1 TABLET BY MOUTH EVERY MORNING 90 tablet 1  . MAGNESIUM PO Take 2 tablets by mouth daily.    . meloxicam (MOBIC) 15 MG tablet Take 1 tablet (15 mg total) by mouth as needed for pain. 90 tablet 1  . metFORMIN (GLUCOPHAGE XR) 500 MG 24 hr tablet Take 2 tabs 2 times daily with food for diabetes 360 tablet 3  . mometasone (NASONEX) 50 MCG/ACT nasal spray Place 2 sprays into the nose daily. 51 g 99  . Multiple Vitamin (MULITIVITAMIN WITH MINERALS) TABS Take 1 tablet by mouth daily.    .  Omega-3 Fatty Acids (FISH OIL) 1000 MG CAPS Take 1 capsule (1,000 mg total) by mouth daily.  0  . OVER THE COUNTER MEDICATION 500 mg daily. Tumeric 500 mg daily    . OVER THE COUNTER MEDICATION Soft stool 1 daily    . potassium chloride SA (KLOR-CON M20) 20 MEQ tablet Take 1 tablet (20 mEq total) by mouth 3 (three) times daily. 270 tablet 1  . pravastatin (PRAVACHOL) 40 MG tablet TAKE 1 BY MOUTH EVERY OTHER DAY 45 tablet 3  . ranitidine (ZANTAC) 300 MG tablet TAKE 1 BY MOUTH DAILY 90 tablet PRN  . traMADol (ULTRAM) 50 MG tablet Take 1 tablet (50 mg total) by mouth as needed. 90 tablet 0  . metoprolol succinate (TOPROL-XL) 25 MG 24 hr tablet TAKE 1 BY MOUTH DAILY 90 tablet 99   No current facility-administered medications for this visit.     Past Medical History  Diagnosis Date  . Diabetes mellitus   . Hypertension   . HYPERLIPIDEMIA   . CEREBROVASCULAR DISEASE   . BICUSPID AORTIC VALVE   . GERD   . DEGENERATIVE JOINT DISEASE   . ASTHMA   . Palpitation   . Vitamin D deficiency   . HSV-1 (herpes simplex virus 1) infection   . DJD (degenerative joint disease)   . Allergic rhinitis     Past Surgical History  Procedure Laterality Date  . Repair of trigger finger    . Abdominal hysterectomy      Social History   Social History  . Marital Status: Widowed    Spouse Name: N/A  . Number of Children: N/A  . Years of Education: N/A   Occupational History  . Not on file.   Social History Main Topics  . Smoking status: Never Smoker   . Smokeless tobacco: Not on file  . Alcohol Use: Yes     Comment: very rarely  . Drug Use: No  . Sexual Activity: Not Currently   Other Topics Concern  . Not on file   Social History Narrative    Family History  Problem Relation Age of Onset  . Coronary artery disease Mother   . Heart disease Mother   . Hypertension Mother   . Diabetes Father   . Hypertension Father     ROS: no fevers or chills, productive cough, hemoptysis,  dysphasia, odynophagia, melena, hematochezia, dysuria, hematuria, rash, seizure activity, orthopnea, PND, claudication. Remaining systems are negative.  Physical Exam: Well-developed well-nourished in no acute distress.  Skin is warm and dry.  HEENT Significant for right eye deviation Neck is supple.  Chest is clear to auscultation with normal expansion.  Cardiovascular exam is regular rate and rhythm.  Abdominal exam nontender or distended. No masses palpated. Extremities show no edema. neuro grossly intact  ECG Sinus rhythm at a rate of 74. Septal infarct.

## 2015-04-11 ENCOUNTER — Ambulatory Visit (INDEPENDENT_AMBULATORY_CARE_PROVIDER_SITE_OTHER): Payer: BLUE CROSS/BLUE SHIELD | Admitting: Cardiology

## 2015-04-11 ENCOUNTER — Encounter: Payer: Self-pay | Admitting: Cardiology

## 2015-04-11 VITALS — BP 140/60 | HR 74 | Ht 59.0 in | Wt 129.0 lb

## 2015-04-11 DIAGNOSIS — I679 Cerebrovascular disease, unspecified: Secondary | ICD-10-CM

## 2015-04-11 DIAGNOSIS — Q231 Congenital insufficiency of aortic valve: Secondary | ICD-10-CM | POA: Diagnosis not present

## 2015-04-11 DIAGNOSIS — I10 Essential (primary) hypertension: Secondary | ICD-10-CM | POA: Diagnosis not present

## 2015-04-11 DIAGNOSIS — R9431 Abnormal electrocardiogram [ECG] [EKG]: Secondary | ICD-10-CM | POA: Insufficient documentation

## 2015-04-11 DIAGNOSIS — R002 Palpitations: Secondary | ICD-10-CM | POA: Diagnosis not present

## 2015-04-11 DIAGNOSIS — E782 Mixed hyperlipidemia: Secondary | ICD-10-CM

## 2015-04-11 NOTE — Assessment & Plan Note (Signed)
Patient with question anterior infarct on electrocardiogram. No chest pain. Will schedule echocardiogram to assess wall motion and LV function.

## 2015-04-11 NOTE — Assessment & Plan Note (Addendum)
Blood pressure controlled. Continue present medications. Note patient is complaining of occasional mild pedal edema. I have instructed her to keep her feet elevated.She will wear compression hose as needed. No edema on exam this morning.

## 2015-04-11 NOTE — Assessment & Plan Note (Signed)
Continue statin. 

## 2015-04-11 NOTE — Assessment & Plan Note (Addendum)
Plan repeat echocardiogram. 

## 2015-04-11 NOTE — Assessment & Plan Note (Signed)
Continue aspirin and statin. Cerebrovascular disease mild on most recent carotid Dopplers.

## 2015-04-11 NOTE — Assessment & Plan Note (Signed)
Continue beta blocker. 

## 2015-04-11 NOTE — Patient Instructions (Signed)

## 2015-04-14 ENCOUNTER — Other Ambulatory Visit: Payer: Self-pay | Admitting: Internal Medicine

## 2015-04-17 ENCOUNTER — Other Ambulatory Visit: Payer: Self-pay

## 2015-04-17 ENCOUNTER — Ambulatory Visit (HOSPITAL_COMMUNITY): Payer: BLUE CROSS/BLUE SHIELD | Attending: Cardiology

## 2015-04-17 DIAGNOSIS — E119 Type 2 diabetes mellitus without complications: Secondary | ICD-10-CM | POA: Diagnosis not present

## 2015-04-17 DIAGNOSIS — R9431 Abnormal electrocardiogram [ECG] [EKG]: Secondary | ICD-10-CM | POA: Insufficient documentation

## 2015-04-17 DIAGNOSIS — I119 Hypertensive heart disease without heart failure: Secondary | ICD-10-CM | POA: Diagnosis not present

## 2015-04-17 DIAGNOSIS — Q231 Congenital insufficiency of aortic valve: Secondary | ICD-10-CM | POA: Insufficient documentation

## 2015-04-17 DIAGNOSIS — E785 Hyperlipidemia, unspecified: Secondary | ICD-10-CM | POA: Insufficient documentation

## 2015-04-22 ENCOUNTER — Ambulatory Visit (INDEPENDENT_AMBULATORY_CARE_PROVIDER_SITE_OTHER): Payer: BLUE CROSS/BLUE SHIELD | Admitting: Internal Medicine

## 2015-04-22 ENCOUNTER — Encounter: Payer: Self-pay | Admitting: Internal Medicine

## 2015-04-22 VITALS — BP 124/62 | HR 76 | Temp 97.5°F | Resp 16 | Ht 59.0 in | Wt 131.2 lb

## 2015-04-22 DIAGNOSIS — E559 Vitamin D deficiency, unspecified: Secondary | ICD-10-CM

## 2015-04-22 DIAGNOSIS — E1122 Type 2 diabetes mellitus with diabetic chronic kidney disease: Secondary | ICD-10-CM | POA: Insufficient documentation

## 2015-04-22 DIAGNOSIS — E119 Type 2 diabetes mellitus without complications: Secondary | ICD-10-CM | POA: Diagnosis not present

## 2015-04-22 DIAGNOSIS — I10 Essential (primary) hypertension: Secondary | ICD-10-CM

## 2015-04-22 DIAGNOSIS — N181 Chronic kidney disease, stage 1: Secondary | ICD-10-CM | POA: Insufficient documentation

## 2015-04-22 DIAGNOSIS — G4726 Circadian rhythm sleep disorder, shift work type: Secondary | ICD-10-CM

## 2015-04-22 DIAGNOSIS — E782 Mixed hyperlipidemia: Secondary | ICD-10-CM

## 2015-04-22 DIAGNOSIS — Z79899 Other long term (current) drug therapy: Secondary | ICD-10-CM | POA: Diagnosis not present

## 2015-04-22 DIAGNOSIS — K219 Gastro-esophageal reflux disease without esophagitis: Secondary | ICD-10-CM | POA: Diagnosis not present

## 2015-04-22 DIAGNOSIS — H5000 Unspecified esotropia: Secondary | ICD-10-CM | POA: Insufficient documentation

## 2015-04-22 LAB — CBC WITH DIFFERENTIAL/PLATELET
Basophils Absolute: 0 10*3/uL (ref 0.0–0.1)
Basophils Relative: 0 % (ref 0–1)
Eosinophils Absolute: 0.2 10*3/uL (ref 0.0–0.7)
Eosinophils Relative: 3 % (ref 0–5)
HCT: 43.2 % (ref 36.0–46.0)
Hemoglobin: 14.1 g/dL (ref 12.0–15.0)
Lymphocytes Relative: 35 % (ref 12–46)
Lymphs Abs: 2.9 10*3/uL (ref 0.7–4.0)
MCH: 30.8 pg (ref 26.0–34.0)
MCHC: 32.6 g/dL (ref 30.0–36.0)
MCV: 94.3 fL (ref 78.0–100.0)
MPV: 10.7 fL (ref 8.6–12.4)
Monocytes Absolute: 0.4 10*3/uL (ref 0.1–1.0)
Monocytes Relative: 5 % (ref 3–12)
Neutro Abs: 4.7 10*3/uL (ref 1.7–7.7)
Neutrophils Relative %: 57 % (ref 43–77)
Platelets: 295 10*3/uL (ref 150–400)
RBC: 4.58 MIL/uL (ref 3.87–5.11)
RDW: 13.5 % (ref 11.5–15.5)
WBC: 8.2 10*3/uL (ref 4.0–10.5)

## 2015-04-22 LAB — HEPATIC FUNCTION PANEL
ALT: 16 U/L (ref 6–29)
AST: 15 U/L (ref 10–35)
Albumin: 4.1 g/dL (ref 3.6–5.1)
Alkaline Phosphatase: 68 U/L (ref 33–130)
Bilirubin, Direct: 0.1 mg/dL (ref <=0.2)
Indirect Bilirubin: 0.1 mg/dL — ABNORMAL LOW (ref 0.2–1.2)
Total Bilirubin: 0.2 mg/dL (ref 0.2–1.2)
Total Protein: 6.9 g/dL (ref 6.1–8.1)

## 2015-04-22 LAB — LIPID PANEL
Cholesterol: 139 mg/dL (ref 125–200)
HDL: 51 mg/dL (ref >=46)
LDL Cholesterol: 78 mg/dL (ref <130)
Total CHOL/HDL Ratio: 2.7 Ratio (ref <=5.0)
Triglycerides: 48 mg/dL (ref <150)
VLDL: 10 mg/dL (ref <30)

## 2015-04-22 LAB — BASIC METABOLIC PANEL WITH GFR
BUN: 19 mg/dL (ref 7–25)
CO2: 29 mmol/L (ref 20–31)
Calcium: 9.6 mg/dL (ref 8.6–10.4)
Chloride: 100 mmol/L (ref 98–110)
Creat: 0.59 mg/dL (ref 0.50–1.05)
GFR, Est African American: >89 mL/min (ref >=60)
GFR, Est Non African American: >89 mL/min (ref >=60)
Glucose, Bld: 80 mg/dL (ref 65–99)
Potassium: 4.2 mmol/L (ref 3.5–5.3)
Sodium: 140 mmol/L (ref 135–146)

## 2015-04-22 LAB — MAGNESIUM: Magnesium: 1.8 mg/dL (ref 1.5–2.5)

## 2015-04-22 LAB — TSH: TSH: 1.38 mIU/L

## 2015-04-22 MED ORDER — METHYLPHENIDATE HCL 5 MG PO TABS: ORAL_TABLET | ORAL | Status: DC

## 2015-04-22 NOTE — Progress Notes (Signed)
Patient ID: [name], female   DOB: July 23, 1955, 60 y.o.   MRN: [id number]   This very nice 60 y.o. WBF presents for  follow up with Hypertension, Hyperlipidemia, T2_DM and Vitamin D Deficiency. 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    Patient is treated for HTN since 2003 & BP has been controlled at home. Today's BP: 124/62 mmHg. Patient has had no complaints of any cardiac type chest pain, palpitations, dyspnea/orthopnea/PND, dizziness, claudication, or dependent edema.   Hyperlipidemia is controlled with diet & meds. Patient denies myalgias or other med SE's. Last Lipids were at goal with Cholesterol 167; HDL 51; LDL 102; Triglycerides 70 on 01/20/2015.   Also, the patient has history of T2_NIDDM since 1996 and has had no symptoms of reactive hypoglycemia, diabetic polys, paresthesias or visual blurring.  Last A1c was 7.3% on 01/20/2015.   Further, the patient also has history of Vitamin D Deficiency of "30" in 2008  and supplements vitamin D without any suspected side-effects. Last vitamin D was  68 on 01/20/2015.    Medication Sig  . aspirin EC 81 MG tablet Take 81 mg by mouth daily.  . SUPER B COMPLEX  Take 1 tablet by mouth daily. Takes every other day  . VITAMIN D 2000 UNITS CAPS Take 1 capsule by mouth 2 (two) times daily.  . ferrous sulfate 325  MG  Take 325 mg by mouth daily with breakfast.   . FIBER FORMULA  Take 1 tablet by mouth daily.   . fluconazole150 MG tablet Use once for yeast infection as needed  . INVOKANA 300 MG TABS  TAKE 1 TABLET BY MOUTH DAILY BEFORE BREAKFAST  . lisinopril-hctz 10-12.5 MG  TAKE 1 TABLET BY MOUTH EVERY MORNING  . MAGNESIUM  Take 2 tablets by mouth daily.  . meloxicam  15 MG tablet Take 1 tablet (15 mg total) by mouth as needed for pain.  . metFORMIN-XR 500 MG 24 hr tablet Take 2 tabs 2 times daily with food for diabetes  . metoprolol succinate-XL 25 MG  TAKE 1 BY MOUTH DAILY  . NASONEX nasal spray Place 2 sprays into the nose daily.  [name] WITH MINERALS Take 1 tablet by mouth daily.  . Omega-3 FISH OIL 1000 MG CAPS Take 1 capsule (1,000 mg total) by mouth daily.  [name] OVER THE COUNTER MEDICATION 500 mg daily. Tumeric 500 mg daily  . OVER THE COUNTER MEDICATION Soft stool 1 daily  . potassium chloride  20 MEQ tablet Take 1 tablet (20 mEq total) by mouth 3 (three) times daily.  . pravastatin40 MG tablet TAKE 1 BY MOUTH EVERY OTHER DAY  . ranitidine  300 MG tablet TAKE 1 BY MOUTH DAILY  . traMADol  50 MG tablet Take 1 tablet (50 mg total) by mouth as needed.   Allergies  Allergen Reactions  . Naprosyn [Naproxen] Other (See Comments)    GI upset   PMHx:   Past Medical History  Diagnosis Date  . Diabetes mellitus   . Hypertension   . HYPERLIPIDEMIA   . CEREBROVASCULAR DISEASE   . BICUSPID AORTIC VALVE   . GERD   . DEGENERATIVE JOINT DISEASE   . ASTHMA   . Palpitation   . Vitamin D deficiency   . HSV-1 (herpes simplex virus 1) infection   . DJD (degenerative joint disease)   . Allergic rhinitis    Immunization History  Administered  Date(s) Administered  . DT 10/11/2014  . DTaP 03/18/2004  . Influenza Split 11/15/2013, 11/30/2014  . Influenza Whole 11/02/2012  . PPD Test 07/30/2013, 10/11/2014  . Pneumococcal Conjugate-13 11/15/2013  . Pneumococcal Polysaccharide-23 05/16/1996   Past Surgical History  Procedure Laterality Date  . Repair of trigger finger    . Abdominal hysterectomy     FHx:    Reviewed / unchanged  SHx:    Reviewed / unchanged  Systems Review:  Constitutional: Denies fever, chills, wt changes, headaches, insomnia, fatigue, night sweats, change in appetite. Eyes: Denies redness, blurred vision, diplopia, discharge, itchy, watery eyes.  ENT: Denies discharge, congestion, post nasal drip, epistaxis, sore throat, earache, hearing loss, dental pain, tinnitus, vertigo, sinus pain, snoring.  CV:  Denies chest pain, palpitations, irregular heartbeat, syncope, dyspnea, diaphoresis, orthopnea, PND, claudication or edema. Respiratory: denies cough, dyspnea, DOE, pleurisy, hoarseness, laryngitis, wheezing.  Gastrointestinal: Denies dysphagia, odynophagia, heartburn, reflux, water brash, abdominal pain or cramps, nausea, vomiting, bloating, diarrhea, constipation, hematemesis, melena, hematochezia  or hemorrhoids. Genitourinary: Denies dysuria, frequency, urgency, nocturia, hesitancy, discharge, hematuria or flank pain. Musculoskeletal: Denies arthralgias, myalgias, stiffness, jt. swelling, pain, limping or strain/sprain.  Skin: Denies pruritus, rash, hives, warts, acne, eczema or change in skin lesion(s). Neuro: No weakness, tremor, incoordination, spasms, paresthesia or pain. Psychiatric: Denies confusion, memory loss or sensory loss. Endo: Denies change in weight, skin or hair change.  Heme/Lymph: No excessive bleeding, bruising or enlarged lymph nodes.  Physical Exam  BP 124/62 mmHg  Pulse 76  Temp(Src) 97.5 F (36.4 C)  Resp 16  Ht 4\' 11"  (1.499 m)  Wt 131 lb 3.2 oz (59.512 kg)  BMI 26.49 kg/m2  Appears well nourished and in no distress. Eyes: PERRLA, Right Esotropia. EOMs, conjunctiva no swelling or erythema. Sinuses: No frontal/maxillary tenderness ENT/Mouth: EAC's clear, TM's nl w/o erythema, bulging. Nares clear w/o erythema, swelling, exudates. Oropharynx clear without erythema or exudates. Oral hygiene is good. Tongue normal, non obstructing. Hearing intact.  Neck: Supple. Thyroid nl. Car 2+/2+ without bruits, nodes or JVD. Chest: Respirations nl with BS clear & equal w/o rales, rhonchi, wheezing or stridor.  Cor: Heart sounds normal w/ regular rate and rhythm without sig. murmurs, gallops, clicks, or rubs. Peripheral pulses normal and equal  without edema.  Abdomen: Soft & bowel sounds normal. Non-tender w/o guarding, rebound, hernias, masses, or organomegaly.  Lymphatics:  Unremarkable.  Musculoskeletal: Full ROM all peripheral extremities, joint stability, 5/5 strength, and normal gait.  Skin: Warm, dry without exposed rashes, lesions or ecchymosis apparent.  Neuro: Cranial nerves intact, reflexes equal bilaterally. Sensory-motor testing grossly intact. Tendon reflexes grossly intact.  Pysch: Alert & oriented x 3.  Insight and judgement nl & appropriate. No ideations.  Assessment and Plan:  1. Essential hypertension  - TSH  2. Hyperlipidemia  - Lipid panel - TSH  3. Diabetes mellitus without complication (HCC)  - Hemoglobin A1c - Insulin, random  4. Vitamin D deficiency  - VITAMIN D 25 Hydroxy  5. Gastroesophageal reflux disease   6. Shift work sleep disorder  - methylphenidate (RITALIN) 5 MG tablet; Take 1/2 to 1 tablet 2 x day if needed for alertness at work  Dispense: 60 tablet; Refill: 0  7. Medication management  - CBC with Differential/Platelet - BASIC METABOLIC PANEL WITH GFR - Hepatic function panel - Magnesium   Recommended regular exercise, BP monitoring, weight control, and discussed med and SE's. Recommended labs to assess and monitor clinical status. Further disposition pending results of labs. Over 30  minutes of exam, counseling, chart review was performed

## 2015-04-22 NOTE — Patient Instructions (Signed)

## 2015-04-23 LAB — VITAMIN D 25 HYDROXY (VIT D DEFICIENCY, FRACTURES): Vit D, 25-Hydroxy: 64 ng/mL (ref 30–100)

## 2015-04-23 LAB — HEMOGLOBIN A1C
Hgb A1c MFr Bld: 6.7 % — ABNORMAL HIGH (ref <5.7)
Mean Plasma Glucose: 146 mg/dL — ABNORMAL HIGH (ref <117)

## 2015-04-23 LAB — INSULIN, RANDOM: Insulin: 3.9 u[IU]/mL (ref 2.0–19.6)

## 2015-05-29 ENCOUNTER — Other Ambulatory Visit: Payer: Self-pay | Admitting: Internal Medicine

## 2015-06-16 ENCOUNTER — Other Ambulatory Visit: Payer: Self-pay | Admitting: Internal Medicine

## 2015-06-18 ENCOUNTER — Other Ambulatory Visit: Payer: Self-pay | Admitting: *Deleted

## 2015-06-18 MED ORDER — METFORMIN HCL 1000 MG PO TABS: ORAL_TABLET | ORAL | Status: DC

## 2015-07-10 DIAGNOSIS — M1811 Unilateral primary osteoarthritis of first carpometacarpal joint, right hand: Secondary | ICD-10-CM | POA: Diagnosis not present

## 2015-07-10 DIAGNOSIS — M1812 Unilateral primary osteoarthritis of first carpometacarpal joint, left hand: Secondary | ICD-10-CM | POA: Diagnosis not present

## 2015-07-18 ENCOUNTER — Other Ambulatory Visit: Payer: Self-pay | Admitting: Internal Medicine

## 2015-07-18 DIAGNOSIS — E119 Type 2 diabetes mellitus without complications: Secondary | ICD-10-CM | POA: Diagnosis not present

## 2015-07-21 DIAGNOSIS — M533 Sacrococcygeal disorders, not elsewhere classified: Secondary | ICD-10-CM | POA: Diagnosis not present

## 2015-07-22 ENCOUNTER — Other Ambulatory Visit: Payer: Self-pay | Admitting: *Deleted

## 2015-07-22 MED ORDER — RANITIDINE HCL 300 MG PO TABS: ORAL_TABLET | ORAL | Status: DC

## 2015-07-24 ENCOUNTER — Encounter: Payer: Self-pay | Admitting: Physician Assistant

## 2015-07-24 ENCOUNTER — Other Ambulatory Visit: Payer: Self-pay | Admitting: Internal Medicine

## 2015-07-24 ENCOUNTER — Ambulatory Visit (INDEPENDENT_AMBULATORY_CARE_PROVIDER_SITE_OTHER): Payer: BLUE CROSS/BLUE SHIELD | Admitting: Physician Assistant

## 2015-07-24 VITALS — BP 120/64 | HR 87 | Temp 97.3°F | Resp 16 | Ht 59.0 in | Wt 128.4 lb

## 2015-07-24 DIAGNOSIS — R1013 Epigastric pain: Secondary | ICD-10-CM | POA: Diagnosis not present

## 2015-07-24 DIAGNOSIS — M791 Myalgia, unspecified site: Secondary | ICD-10-CM

## 2015-07-24 DIAGNOSIS — E119 Type 2 diabetes mellitus without complications: Secondary | ICD-10-CM | POA: Diagnosis not present

## 2015-07-24 DIAGNOSIS — E782 Mixed hyperlipidemia: Secondary | ICD-10-CM | POA: Diagnosis not present

## 2015-07-24 DIAGNOSIS — Z79899 Other long term (current) drug therapy: Secondary | ICD-10-CM | POA: Diagnosis not present

## 2015-07-24 DIAGNOSIS — I10 Essential (primary) hypertension: Secondary | ICD-10-CM | POA: Diagnosis not present

## 2015-07-24 DIAGNOSIS — E559 Vitamin D deficiency, unspecified: Secondary | ICD-10-CM

## 2015-07-24 LAB — CBC WITH DIFFERENTIAL/PLATELET
Basophils Absolute: 0 cells/uL (ref 0–200)
Basophils Relative: 0 %
Eosinophils Absolute: 82 cells/uL (ref 15–500)
Eosinophils Relative: 1 %
HCT: 44.2 % (ref 35.0–45.0)
Hemoglobin: 14.1 g/dL (ref 11.7–15.5)
Lymphocytes Relative: 28 %
Lymphs Abs: 2296 cells/uL (ref 850–3900)
MCH: 30.5 pg (ref 27.0–33.0)
MCHC: 31.9 g/dL — ABNORMAL LOW (ref 32.0–36.0)
MCV: 95.5 fL (ref 80.0–100.0)
MPV: 10.6 fL (ref 7.5–12.5)
Monocytes Absolute: 492 cells/uL (ref 200–950)
Monocytes Relative: 6 %
Neutro Abs: 5330 cells/uL (ref 1500–7800)
Neutrophils Relative %: 65 %
Platelets: 276 10*3/uL (ref 140–400)
RBC: 4.63 MIL/uL (ref 3.80–5.10)
RDW: 13.6 % (ref 11.0–15.0)
WBC: 8.2 10*3/uL (ref 3.8–10.8)

## 2015-07-24 LAB — BASIC METABOLIC PANEL WITH GFR
BUN: 14 mg/dL (ref 7–25)
CO2: 28 mmol/L (ref 20–31)
Calcium: 9.4 mg/dL (ref 8.6–10.4)
Chloride: 101 mmol/L (ref 98–110)
Creat: 0.61 mg/dL (ref 0.50–1.05)
GFR, Est African American: >89 mL/min (ref >=60)
GFR, Est Non African American: >89 mL/min (ref >=60)
Glucose, Bld: 96 mg/dL (ref 65–99)
Potassium: 4.1 mmol/L (ref 3.5–5.3)
Sodium: 141 mmol/L (ref 135–146)

## 2015-07-24 LAB — HEPATIC FUNCTION PANEL
ALT: 16 U/L (ref 6–29)
AST: 14 U/L (ref 10–35)
Albumin: 4.2 g/dL (ref 3.6–5.1)
Alkaline Phosphatase: 71 U/L (ref 33–130)
Bilirubin, Direct: 0.1 mg/dL (ref <=0.2)
Indirect Bilirubin: 0.2 mg/dL (ref 0.2–1.2)
Total Bilirubin: 0.3 mg/dL (ref 0.2–1.2)
Total Protein: 7 g/dL (ref 6.1–8.1)

## 2015-07-24 LAB — FERRITIN: Ferritin: 48 ng/mL (ref 10–232)

## 2015-07-24 LAB — LIPID PANEL
Cholesterol: 146 mg/dL (ref 125–200)
HDL: 54 mg/dL (ref >=46)
LDL Cholesterol: 79 mg/dL (ref <130)
Total CHOL/HDL Ratio: 2.7 Ratio (ref <=5.0)
Triglycerides: 64 mg/dL (ref <150)
VLDL: 13 mg/dL (ref <30)

## 2015-07-24 LAB — IRON AND TIBC
%SAT: 17 % (ref 11–50)
Iron: 54 ug/dL (ref 45–160)
TIBC: 312 ug/dL (ref 250–450)
UIBC: 258 ug/dL (ref 125–400)

## 2015-07-24 LAB — CK: Total CK: 72 U/L (ref 7–177)

## 2015-07-24 LAB — TSH: TSH: 1.3 mIU/L

## 2015-07-24 LAB — MAGNESIUM: Magnesium: 1.8 mg/dL (ref 1.5–2.5)

## 2015-07-24 LAB — HEMOGLOBIN A1C
Hgb A1c MFr Bld: 6.8 % — ABNORMAL HIGH (ref <5.7)
Mean Plasma Glucose: 148 mg/dL

## 2015-07-24 LAB — AMYLASE: Amylase: 46 U/L (ref 0–105)

## 2015-07-24 LAB — VITAMIN B12: Vitamin B-12: 967 pg/mL (ref 200–1100)

## 2015-07-24 MED ORDER — GABAPENTIN 100 MG PO CAPS: ORAL_CAPSULE | ORAL | Status: DC

## 2015-07-24 NOTE — Patient Instructions (Addendum)
Take nexium samples x 1 week and then get on zantac twice a day for the stomach burning If worsening pain come back to the office or go to the ER   Can take the gabapentin for nerve pain. It can make you sleepy so we suggest trying it BEFORE YOU GO TO BED and please plan to not drive or do anything strenuous. Also please do not take this medication with alcohol.  Start out 1 pill before bed, can increase to 2 pills before bed. Please call the office if you have any side effects.   Can take 3 pills a day however you would like  Some examples: - 1 breakfast, lunch, bedtime. - 1 at breakfast, 2 at bed time  How should I use this medicine? Take this medicine by mouth with a glass of water. Follow the directions on the prescription label. You can take this medicine with or without food. Take your doses at regular intervals. Do not take your medicine more often than directed. Do not stop taking except on your doctor's advice.  What if I miss a dose? If you miss a dose, take it as soon as you can. If it is almost time for your next dose, take only that dose. Do not take double or extra doses.  What should I watch for while using this medicine? Tell your doctor or healthcare professional if your symptoms do not start to get better or if they get worse.   You may get drowsy or dizzy. Do not drive, use machinery, or do anything that needs mental alertness until you know how this medicine affects you. Do not stand or sit up quickly, especially if you are an older patient. This reduces the risk of dizzy or fainting spells. Alcohol may interfere with the effect of this medicine. Avoid alcoholic drinks. If you have a heart condition, like congestive heart failure, and notice that you are retaining water and have swelling in your hands or feet, contact your health care provider immediately.  What side effects may I notice from receiving this medicine? Side effects that you should report to your doctor or  health care professional as soon as possible and are very rare: -allergic reactions like skin rash, itching or hives, swelling of the face, lips, or tongue -breathing problems -changes in vision -jerking or unusual movements of any part of your body -suicidal thoughts or other mood changes -swelling of the ankles, feet, hands -unusual bruising or bleeding  Side effects that usually do not require medical attention (Report these to your doctor or health care professional if they continue or are bothersome.): -dizziness -drowsiness -dry mouth -nausea -tremors  Muscle aches  We will check your potassium, magnesium to see if these are low which can cause muscle aches.   Please make sure you are taking 64 oz of water a day as long as you do now have a heart condition.   -Please take Tylenol or Aleve for pain. -You can take tylenol (500mg ) or tylenol arthritis (650mg ). The max you can take of tylenol a day is 3000mg  daily, this is a max of 6 pills a day of the regular tyelnol (500mg ) or a max of 4 a day of the tylenol arthritis (650mg ) as long as no other medications you are taking contain tylenol.  -Aleve/Naproxen Sodium- can take 1-2 in the morning and 1 at night. Max 3 a day. Take with food to avoid ulcers. Does affect your kidney function so use sparingly.  Muscle Pain Muscle pain (myalgia) may be caused by many things, including:  Overuse or muscle strain, especially if you are not in shape. This is the most common cause of muscle pain.  Injury.  Bruises.  Viruses, such as the flu.  Infectious diseases.  Fibromyalgia, which is a chronic condition that causes muscle tenderness, fatigue, and headache.  Autoimmune diseases, including lupus.  Certain drugs, including ACE inhibitors and statins. Muscle pain may be mild or severe. In most cases, the pain lasts only a short time and goes away without treatment. To diagnose the cause of your muscle pain, your health care  provider will take your medical history. This means he or she will ask you when your muscle pain began and what has been happening. If you have not had muscle pain for very long, your health care provider may want to wait before doing much testing. If your muscle pain has lasted a long time, your health care provider may want to run tests right away. If your health care provider thinks your muscle pain may be caused by illness, you may need to have additional tests to rule out certain conditions.  Treatment for muscle pain depends on the cause. Home care is often enough to relieve muscle pain. Your health care provider may also prescribe anti-inflammatory medicine. HOME CARE INSTRUCTIONS Watch your condition for any changes. The following actions may help to lessen any discomfort you are feeling:  Only take over-the-counter or prescription medicines as directed by your health care provider.  Apply ice to the sore muscle:  Put ice in a plastic bag.  Place a towel between your skin and the bag.  Leave the ice on for 15-20 minutes, 3-4 times a day.  You may alternate applying hot and cold packs to the muscle as directed by your health care provider.  If overuse is causing your muscle pain, slow down your activities until the pain goes away.  Remember that it is normal to feel some muscle pain after starting a workout program. Muscles that have not been used often will be sore at first.  Do regular, gentle exercises if you are not usually active.  Warm up before exercising to lower your risk of muscle pain.  Do not continue working out if the pain is very bad. Bad pain could mean you have injured a muscle. SEEK MEDICAL CARE IF:  Your muscle pain gets worse, and medicines do not help.  You have muscle pain that lasts longer than 3 days.  You have a rash or fever along with muscle pain.  You have muscle pain after a tick bite.  You have muscle pain while working out, even though you are  in good physical condition.  You have redness, soreness, or swelling along with muscle pain.  You have muscle pain after starting a new medicine or changing the dose of a medicine. SEEK IMMEDIATE MEDICAL CARE IF:  You have trouble breathing.  You have trouble swallowing.  You have muscle pain along with a stiff neck, fever, and vomiting.  You have severe muscle weakness or cannot move part of your body. MAKE SURE YOU:   Understand these instructions.  Will watch your condition.  Will get help right away if you are not doing well or get worse. Document Released: 12/24/2005 Document Revised: 02/06/2013 Document Reviewed: 11/28/2012 [facility] Patient Information 2015 [place], [place]. This information is not intended to replace advice given to you by your health care provider. Make sure you discuss  any questions you have with your health care provider.     Food Choices for Gastroesophageal Reflux Disease, Adult When you have gastroesophageal reflux disease (GERD), the foods you eat and your eating habits are very important. Choosing the right foods can help ease the discomfort of GERD. WHAT GENERAL GUIDELINES DO I NEED TO FOLLOW?  Choose fruits, vegetables, whole grains, low-fat dairy products, and low-fat meat, fish, and poultry.  Limit fats such as oils, salad dressings, butter, nuts, and avocado.  Keep a food diary to identify foods that cause symptoms.  Avoid foods that cause reflux. These may be different for different people.  Eat frequent small meals instead of three large meals each day.  Eat your meals slowly, in a relaxed setting.  Limit fried foods.  Cook foods using methods other than frying.  Avoid drinking alcohol.  Avoid drinking large amounts of liquids with your meals.  Avoid bending over or lying down until 2-3 hours after eating. WHAT FOODS ARE NOT RECOMMENDED? The following are some foods and drinks that may worsen your  symptoms: Vegetables Tomatoes. Tomato juice. Tomato and spaghetti sauce. Chili peppers. Onion and garlic. Horseradish. Fruits Oranges, grapefruit, and lemon (fruit and juice). Meats High-fat meats, fish, and poultry. This includes hot dogs, ribs, ham, sausage, salami, and bacon. Dairy Whole milk and chocolate milk. Sour cream. Cream. Butter. Ice cream. Cream cheese.  Beverages Coffee and tea, with or without caffeine. Carbonated beverages or energy drinks. Condiments Hot sauce. Barbecue sauce.  Sweets/Desserts Chocolate and cocoa. Donuts. Peppermint and spearmint. Fats and Oils High-fat foods, including [place] fries and potato chips. Other Vinegar. Strong spices, such as black pepper, white pepper, red pepper, cayenne, curry powder, cloves, ginger, and chili powder. The items listed above may not be a complete list of foods and beverages to avoid. Contact your dietitian for more information.   This information is not intended to replace advice given to you by your health care provider. Make sure you discuss any questions you have with your health care provider.   Document Released: 02/01/2005 Document Revised: 02/22/2014 Document Reviewed: 12/06/2012 [employer]vier Interactive Patient Education [employer].

## 2015-07-24 NOTE — Progress Notes (Signed)
Assessment and Plan:  1. Hypertension -Continue medication, monitor blood pressure at home. Continue DASH diet.  Reminder to go to the ER if any CP, SOB, nausea, dizziness, severe HA, changes vision/speech, left arm numbness and tingling and jaw pain.  2. Cholesterol -Continue diet and exercise. Check cholesterol.   3. Diabetes without complications -Continue diet and exercise. Check A1C  4. Vitamin D Def - check level and continue medications.   5. Myalgias - stop statin, take NSAIDS PRN, increase fluids, will check CPK, TSH, magnesium, potassium, iron, ferritin, denies recent tick exposure - likely RLS/neuropathy will try gabapentin  6. Epigastric pain: Likely GERD, DDX ulcer ,GB, pancreatitis-  check labs, bland diet, small portions, increase H20, samples of nexium then increase zantac BID if pain continues will return for abdominal U/S or referral to GI Patient advised to go to the ER if the symptoms increase or worsen.     Continue diet and meds as discussed. Further disposition pending results of labs. Discussed med's effects and SE's.   Over 30 minutes of exam, counseling, chart review, and critical decision making was performed   HPI 60 y.o. female  presents for 3 month follow up on hypertension, cholesterol, diabetes and vitamin D deficiency.   Her blood pressure has been controlled at home, today her BP is  . She follows with Dr. Stanford Breed for bicuspid valve, has a history of CVA in the past. Last Echo was 2014. Has non exertional, night time palpitations.   She does not workout. She denies chest pain, shortness of breath, dizziness.  She is on cholesterol medication and denies myalgias. Her cholesterol is at goal. The cholesterol was:   Lab Results  Component Value Date   CHOL 139 04/22/2015   HDL 51 04/22/2015   LDLCALC 78 04/22/2015   TRIG 48 04/22/2015   CHOLHDL 2.7 04/22/2015    She has been working on diet and exercise for diabetes with other diabetic  neurologic complication, she is on bASA, she is on ACE/ARB, she is on invokana 300mg  1/2 pill every day and metformin and denies paresthesia of the feet, polydipsia, polyuria and visual disturbances. She states her morning sugar has been as low as 80, no hypoglycemia with it, does not check post prandial. Last A1C was:  Lab Results  Component Value Date   HGBA1C 6.7* 04/22/2015  Patient is on Vitamin D supplement. Lab Results  Component Value Date   VD25OH 64 04/22/2015  She complains of fatigue but states she has not been taking her vitamins how she is suppose to. She has been taking her potassium pills 1 pill BID. She is on the iron and B complex.  She states she has bilateral leg pain, lower back/buttocks pain, has legs throbbing at night, occ feels better when her legs move, wears compression stockings at work. Has been seeing Dr. Mina Marble, suppose to get a shot the 14th. She also has an epigastric burning sensation x 1-2 weeks, take mobic PRN. On zantac daily.  BMI is There is no weight on file to calculate BMI., she is working on diet and exercise. Wt Readings from Last 3 Encounters:  04/22/15 131 lb 3.2 oz (59.512 kg)  04/11/15 129 lb (58.514 kg)  01/20/15 131 lb 12.8 oz (59.784 kg)     Current Medications:  Current Outpatient Prescriptions on File Prior to Visit  Medication Sig Dispense Refill  . aspirin EC 81 MG tablet Take 81 mg by mouth daily.    . B Complex-C (SUPER  B COMPLEX PO) Take 1 tablet by mouth daily. Takes every other day    . Cholecalciferol (VITAMIN D) 2000 UNITS CAPS Take 1 capsule by mouth 2 (two) times daily.    . Cinnamon 500 MG capsule Take 500 mg by mouth daily.    . ferrous sulfate 325 (65 FE) MG tablet Take 325 mg by mouth daily with breakfast.     . FIBER FORMULA PO Take 1 tablet by mouth daily.     . fluconazole (DIFLUCAN) 150 MG tablet Use once for yeast infection as needed 2 tablet 1  . GMATE BLOOD GLUCOSE TEST test strip   10  . INVOKANA 300 MG TABS tablet  TAKE 1 TABLET BY MOUTH DAILY BEFORE BREAKFAST 30 tablet 0  . lisinopril-hydrochlorothiazide (PRINZIDE,ZESTORETIC) 10-12.5 MG tablet TAKE 1 TABLET BY MOUTH EVERY MORNING 90 tablet 1  . MAGNESIUM PO Take 2 tablets by mouth daily.    . meloxicam (MOBIC) 15 MG tablet Take 1 tablet (15 mg total) by mouth as needed for pain. 90 tablet 1  . metFORMIN (GLUCOPHAGE XR) 500 MG 24 hr tablet Take 2 tabs 2 times daily with food for diabetes 360 tablet 3  . metFORMIN (GLUCOPHAGE) 1000 MG tablet TAKE 1 BY MOUTH TWICE DAILY WITH A MEAL 180 tablet 0  . methylphenidate (RITALIN) 5 MG tablet Take 1/2 to 1 tablet 2 x day if needed for alertness at work 60 tablet 0  . mometasone (NASONEX) 50 MCG/ACT nasal spray Place 2 sprays into the nose daily. 51 g 99  . Multiple Vitamin (MULITIVITAMIN WITH MINERALS) TABS Take 1 tablet by mouth daily.    . Omega-3 Fatty Acids (FISH OIL) 1000 MG CAPS Take 1 capsule (1,000 mg total) by mouth daily.  0  . OVER THE COUNTER MEDICATION 500 mg daily. Tumeric 500 mg daily    . OVER THE COUNTER MEDICATION Soft stool 1 daily    . potassium chloride SA (KLOR-CON M20) 20 MEQ tablet Take 1 tablet (20 mEq total) by mouth 3 (three) times daily. 270 tablet 1  . pravastatin (PRAVACHOL) 40 MG tablet TAKE 1 BY MOUTH EVERY OTHER DAY 45 tablet 3  . ranitidine (ZANTAC) 300 MG tablet TAKE 1 BY MOUTH DAILY 90 tablet 1  . traMADol (ULTRAM) 50 MG tablet Take 1 tablet (50 mg total) by mouth as needed. 90 tablet 0   No current facility-administered medications on file prior to visit.   Medical History:  Past Medical History  Diagnosis Date  . Diabetes mellitus   . Hypertension   . HYPERLIPIDEMIA   . CEREBROVASCULAR DISEASE   . BICUSPID AORTIC VALVE   . GERD   . DEGENERATIVE JOINT DISEASE   . ASTHMA   . Palpitation   . Vitamin D deficiency   . HSV-1 (herpes simplex virus 1) infection   . DJD (degenerative joint disease)   . Allergic rhinitis    Allergies:  Allergies  Allergen Reactions  .  Naprosyn [Naproxen] Other (See Comments)    GI upset     Review of Systems:  Review of Systems  Constitutional: Positive for malaise/fatigue. Negative for fever, chills, weight loss and diaphoresis.  HENT: Negative.   Respiratory: Negative.   Cardiovascular: Negative for chest pain, palpitations, orthopnea, claudication, leg swelling and PND.  Gastrointestinal: Positive for heartburn. Negative for nausea, vomiting, abdominal pain, diarrhea, constipation, blood in stool and melena.  Genitourinary: Negative.   Musculoskeletal: Positive for myalgias and joint pain. Negative for back pain, falls and neck pain.  Skin: Negative.   Neurological: Negative.  Negative for weakness.  Psychiatric/Behavioral: Negative.     Family history- Review and unchanged Social history- Review and unchanged Physical Exam: There were no vitals taken for this visit. Wt Readings from Last 3 Encounters:  04/22/15 131 lb 3.2 oz (59.512 kg)  04/11/15 129 lb (58.514 kg)  01/20/15 131 lb 12.8 oz (59.784 kg)   General Appearance: Well nourished, in no apparent distress. Eyes: PERRLA, EOMs, conjunctiva no swelling or erythema Sinuses: No Frontal/maxillary tenderness ENT/Mouth: Ext aud canals clear, TMs without erythema, bulging. No erythema, swelling, or exudate on post pharynx.  Tonsils not swollen or erythematous. Hearing normal.  Neck: Supple, thyroid normal.  Respiratory: Respiratory effort normal, BS equal bilaterally without rales, rhonchi, wheezing or stridor.  Cardio: RRR with no MRGs. Brisk peripheral pulses without edema.  Abdomen: Soft, + BS.  Non tender, no guarding, rebound, hernias, masses. Lymphatics: Non tender without lymphadenopathy.  Musculoskeletal: Full ROM, 5/5 strength, Normal gait Skin: Warm, dry without rashes, lesions, ecchymosis.  Neuro: Cranial nerves intact. No cerebellar symptoms.  Psych: Awake and oriented X 3, normal affect, Insight and Judgment appropriate.    Vicie Mutters,  PA-C 8:52 AM [facility]

## 2015-07-25 LAB — VITAMIN D 25 HYDROXY (VIT D DEFICIENCY, FRACTURES): Vit D, 25-Hydroxy: 88 ng/mL (ref 30–100)

## 2015-07-28 ENCOUNTER — Other Ambulatory Visit: Payer: Self-pay | Admitting: *Deleted

## 2015-07-28 MED ORDER — METOPROLOL SUCCINATE ER 25 MG PO TB24: ORAL_TABLET | ORAL | Status: DC

## 2015-07-30 ENCOUNTER — Other Ambulatory Visit: Payer: Self-pay

## 2015-07-30 MED ORDER — POTASSIUM CHLORIDE CRYS ER 20 MEQ PO TBCR: 20.0000 meq | EXTENDED_RELEASE_TABLET | Freq: Three times a day (TID) | ORAL | Status: DC

## 2015-08-05 ENCOUNTER — Other Ambulatory Visit: Payer: Self-pay | Admitting: *Deleted

## 2015-08-05 MED ORDER — METFORMIN HCL 1000 MG PO TABS: ORAL_TABLET | ORAL | Status: DC

## 2015-08-05 MED ORDER — FLUCONAZOLE 150 MG PO TABS: ORAL_TABLET | ORAL | Status: DC

## 2015-08-24 ENCOUNTER — Encounter: Payer: Self-pay | Admitting: *Deleted

## 2015-09-05 ENCOUNTER — Other Ambulatory Visit: Payer: Self-pay | Admitting: *Deleted

## 2015-09-05 ENCOUNTER — Other Ambulatory Visit: Payer: Self-pay | Admitting: Internal Medicine

## 2015-09-05 MED ORDER — ALBUTEROL SULFATE HFA 108 (90 BASE) MCG/ACT IN AERS: INHALATION_SPRAY | RESPIRATORY_TRACT | Status: DC

## 2015-10-07 ENCOUNTER — Other Ambulatory Visit: Payer: Self-pay | Admitting: Internal Medicine

## 2015-10-07 DIAGNOSIS — Z1231 Encounter for screening mammogram for malignant neoplasm of breast: Secondary | ICD-10-CM

## 2015-10-17 ENCOUNTER — Ambulatory Visit
Admission: RE | Admit: 2015-10-17 | Discharge: 2015-10-17 | Disposition: A | Discharge status: Home / Self Care | Payer: BLUE CROSS/BLUE SHIELD | Source: Ambulatory Visit | Attending: Internal Medicine | Admitting: Internal Medicine

## 2015-10-17 DIAGNOSIS — Z1231 Encounter for screening mammogram for malignant neoplasm of breast: Secondary | ICD-10-CM

## 2015-10-25 DIAGNOSIS — Z23 Encounter for immunization: Secondary | ICD-10-CM | POA: Diagnosis not present

## 2015-11-06 ENCOUNTER — Ambulatory Visit (INDEPENDENT_AMBULATORY_CARE_PROVIDER_SITE_OTHER): Payer: BLUE CROSS/BLUE SHIELD | Admitting: Internal Medicine

## 2015-11-06 ENCOUNTER — Other Ambulatory Visit: Payer: Self-pay | Admitting: *Deleted

## 2015-11-06 ENCOUNTER — Encounter: Payer: Self-pay | Admitting: Internal Medicine

## 2015-11-06 VITALS — BP 122/66 | HR 80 | Temp 97.0°F | Resp 16 | Ht <= 58 in | Wt 129.2 lb

## 2015-11-06 DIAGNOSIS — Z136 Encounter for screening for cardiovascular disorders: Secondary | ICD-10-CM

## 2015-11-06 DIAGNOSIS — Z79899 Other long term (current) drug therapy: Secondary | ICD-10-CM

## 2015-11-06 DIAGNOSIS — E559 Vitamin D deficiency, unspecified: Secondary | ICD-10-CM | POA: Diagnosis not present

## 2015-11-06 DIAGNOSIS — K219 Gastro-esophageal reflux disease without esophagitis: Secondary | ICD-10-CM

## 2015-11-06 DIAGNOSIS — I10 Essential (primary) hypertension: Secondary | ICD-10-CM | POA: Diagnosis not present

## 2015-11-06 DIAGNOSIS — Z1212 Encounter for screening for malignant neoplasm of rectum: Secondary | ICD-10-CM

## 2015-11-06 DIAGNOSIS — Z0000 Encounter for general adult medical examination without abnormal findings: Secondary | ICD-10-CM

## 2015-11-06 DIAGNOSIS — R5383 Other fatigue: Secondary | ICD-10-CM

## 2015-11-06 DIAGNOSIS — Z23 Encounter for immunization: Secondary | ICD-10-CM

## 2015-11-06 DIAGNOSIS — Z0001 Encounter for general adult medical examination with abnormal findings: Secondary | ICD-10-CM

## 2015-11-06 DIAGNOSIS — Z111 Encounter for screening for respiratory tuberculosis: Secondary | ICD-10-CM

## 2015-11-06 DIAGNOSIS — E782 Mixed hyperlipidemia: Secondary | ICD-10-CM

## 2015-11-06 DIAGNOSIS — E119 Type 2 diabetes mellitus without complications: Secondary | ICD-10-CM

## 2015-11-06 LAB — BASIC METABOLIC PANEL WITH GFR
BUN: 17 mg/dL (ref 7–25)
CO2: 26 mmol/L (ref 20–31)
Calcium: 9.3 mg/dL (ref 8.6–10.4)
Chloride: 101 mmol/L (ref 98–110)
Creat: 0.57 mg/dL (ref 0.50–0.99)
GFR, Est African American: >89 mL/min (ref >=60)
GFR, Est Non African American: >89 mL/min (ref >=60)
Glucose, Bld: 142 mg/dL — ABNORMAL HIGH (ref 65–99)
Potassium: 4.1 mmol/L (ref 3.5–5.3)
Sodium: 140 mmol/L (ref 135–146)

## 2015-11-06 LAB — CBC WITH DIFFERENTIAL/PLATELET
Basophils Absolute: 0 cells/uL (ref 0–200)
Basophils Relative: 0 %
Eosinophils Absolute: 80 cells/uL (ref 15–500)
Eosinophils Relative: 1 %
HCT: 43 % (ref 35.0–45.0)
Hemoglobin: 14.1 g/dL (ref 11.7–15.5)
Lymphocytes Relative: 36 %
Lymphs Abs: 2880 cells/uL (ref 850–3900)
MCH: 31.4 pg (ref 27.0–33.0)
MCHC: 32.8 g/dL (ref 32.0–36.0)
MCV: 95.8 fL (ref 80.0–100.0)
MPV: 11.2 fL (ref 7.5–12.5)
Monocytes Absolute: 480 cells/uL (ref 200–950)
Monocytes Relative: 6 %
Neutro Abs: 4560 cells/uL (ref 1500–7800)
Neutrophils Relative %: 57 %
Platelets: 296 10*3/uL (ref 140–400)
RBC: 4.49 MIL/uL (ref 3.80–5.10)
RDW: 13.6 % (ref 11.0–15.0)
WBC: 8 10*3/uL (ref 3.8–10.8)

## 2015-11-06 LAB — IRON AND TIBC
%SAT: 16 % (ref 11–50)
Iron: 53 ug/dL (ref 45–160)
TIBC: 323 ug/dL (ref 250–450)
UIBC: 270 ug/dL (ref 125–400)

## 2015-11-06 LAB — MAGNESIUM: Magnesium: 1.9 mg/dL (ref 1.5–2.5)

## 2015-11-06 LAB — HEPATIC FUNCTION PANEL
ALT: 17 U/L (ref 6–29)
AST: 17 U/L (ref 10–35)
Albumin: 4.3 g/dL (ref 3.6–5.1)
Alkaline Phosphatase: 65 U/L (ref 33–130)
Bilirubin, Direct: 0.1 mg/dL (ref <=0.2)
Indirect Bilirubin: 0.1 mg/dL — ABNORMAL LOW (ref 0.2–1.2)
Total Bilirubin: 0.2 mg/dL (ref 0.2–1.2)
Total Protein: 6.8 g/dL (ref 6.1–8.1)

## 2015-11-06 LAB — LIPID PANEL
Cholesterol: 144 mg/dL (ref 125–200)
HDL: 55 mg/dL (ref >=46)
LDL Cholesterol: 75 mg/dL (ref <130)
Total CHOL/HDL Ratio: 2.6 Ratio (ref <=5.0)
Triglycerides: 70 mg/dL (ref <150)
VLDL: 14 mg/dL (ref <30)

## 2015-11-06 LAB — TSH: TSH: 1.17 mIU/L

## 2015-11-06 LAB — VITAMIN B12: Vitamin B-12: 1271 pg/mL — ABNORMAL HIGH (ref 200–1100)

## 2015-11-06 MED ORDER — GLUCOSE BLOOD VI STRP: ORAL_STRIP | 4 refills | Status: DC

## 2015-11-06 MED ORDER — GMATE SMART STARTER W/DEVICE KIT: PACK | 0 refills | Status: DC

## 2015-11-06 MED ORDER — GMATE LANCETS 30G MISC: 4 refills | Status: DC

## 2015-11-06 MED ORDER — FLUCONAZOLE 150 MG PO TABS: ORAL_TABLET | ORAL | 3 refills | Status: DC

## 2015-11-06 NOTE — Patient Instructions (Signed)
Preventive Care for Adults  A healthy lifestyle and preventive care can promote health and wellness. Preventive health guidelines for women include the following key practices.  A routine yearly physical is a good way to check with your health care provider about your health and preventive screening. It is a chance to share any concerns and updates on your health and to receive a thorough exam.  Visit your dentist for a routine exam and preventive care every 6 months. Brush your teeth twice a day and floss once a day. Good oral hygiene prevents tooth decay and gum disease.  The frequency of eye exams is based on your age, health, family medical history, use of contact lenses, and other factors. Follow your health care provider's recommendations for frequency of eye exams.  Eat a healthy diet. Foods like vegetables, fruits, whole grains, low-fat dairy products, and lean protein foods contain the nutrients you need without too many calories. Decrease your intake of foods high in solid fats, added sugars, and salt. Eat the right amount of calories for you.Get information about a proper diet from your health care provider, if necessary.  Regular physical exercise is one of the most important things you can do for your health. Most adults should get at least 150 minutes of moderate-intensity exercise (any activity that increases your heart rate and causes you to sweat) each week. In addition, most adults need muscle-strengthening exercises on 2 or more days a week.  Maintain a healthy weight. The body mass index (BMI) is a screening tool to identify possible weight problems. It provides an estimate of body fat based on height and weight. Your health care provider can find your BMI and can help you achieve or maintain a healthy weight.For adults 20 years and older:  A BMI below 18.5 is considered underweight.  A BMI of 18.5 to 24.9 is normal.  A BMI of 25 to 29.9 is considered overweight.  A BMI of  30 and above is considered obese.  Maintain normal blood lipids and cholesterol levels by exercising and minimizing your intake of saturated fat. Eat a balanced diet with plenty of fruit and vegetables. Blood tests for lipids and cholesterol should begin at age 20 and be repeated every 5 years. If your lipid or cholesterol levels are high, you are over 50, or you are at high risk for heart disease, you may need your cholesterol levels checked more frequently.Ongoing high lipid and cholesterol levels should be treated with medicines if diet and exercise are not working.  If you smoke, find out from your health care provider how to quit. If you do not use tobacco, do not start.  Lung cancer screening is recommended for adults aged 55-80 years who are at high risk for developing lung cancer because of a history of smoking. A yearly low-dose CT scan of the lungs is recommended for people who have at least a 30-pack-year history of smoking and are a current smoker or have quit within the past 15 years. A pack year of smoking is smoking an average of 1 pack of cigarettes a day for 1 year (for example: 1 pack a day for 30 years or 2 packs a day for 15 years). Yearly screening should continue until the smoker has stopped smoking for at least 15 years. Yearly screening should be stopped for people who develop a health problem that would prevent them from having lung cancer treatment.  High blood pressure causes heart disease and increases the risk of   stroke. Your blood pressure should be checked at least every 1 to 2 years. Ongoing high blood pressure should be treated with medicines if weight loss and exercise do not work.  If you are 55-79 years old, ask your health care provider if you should take aspirin to prevent strokes.  Diabetes screening involves taking a blood sample to check your fasting blood sugar level. This should be done once every 3 years, after age 45, if you are within normal weight and  without risk factors for diabetes. Testing should be considered at a younger age or be carried out more frequently if you are overweight and have at least 1 risk factor for diabetes.  Breast cancer screening is essential preventive care for women. You should practice "breast self-awareness." This means understanding the normal appearance and feel of your breasts and may include breast self-examination. Any changes detected, no matter how small, should be reported to a health care provider. Women in their 20s and 30s should have a clinical breast exam (CBE) by a health care provider as part of a regular health exam every 1 to 3 years. After age 40, women should have a CBE every year. Starting at age 40, women should consider having a mammogram (breast X-ray test) every year. Women who have a family history of breast cancer should talk to their health care provider about genetic screening. Women at a high risk of breast cancer should talk to their health care providers about having an MRI and a mammogram every year.  Breast cancer gene (BRCA)-related cancer risk assessment is recommended for women who have family members with BRCA-related cancers. BRCA-related cancers include breast, ovarian, tubal, and peritoneal cancers. Having family members with these cancers may be associated with an increased risk for harmful changes (mutations) in the breast cancer genes BRCA1 and BRCA2. Results of the assessment will determine the need for genetic counseling and BRCA1 and BRCA2 testing.  Routine pelvic exams to screen for cancer are no longer recommended for nonpregnant women who are considered low risk for cancer of the pelvic organs (ovaries, uterus, and vagina) and who do not have symptoms. Ask your health care provider if a screening pelvic exam is right for you.  If you have had past treatment for cervical cancer or a condition that could lead to cancer, you need Pap tests and screening for cancer for at least 20  years after your treatment. If Pap tests have been discontinued, your risk factors (such as having a new sexual partner) need to be reassessed to determine if screening should be resumed. Some women have medical problems that increase the chance of getting cervical cancer. In these cases, your health care provider may recommend more frequent screening and Pap tests.  Colorectal cancer can be detected and often prevented. Most routine colorectal cancer screening begins at the age of 50 years and continues through age 75 years. However, your health care provider may recommend screening at an earlier age if you have risk factors for colon cancer. On a yearly basis, your health care provider may provide home test kits to check for hidden blood in the stool. Use of a small camera at the end of a tube, to directly examine the colon (sigmoidoscopy or colonoscopy), can detect the earliest forms of colorectal cancer. Talk to your health care provider about this at age 50, when routine screening begins. Direct exam of the colon should be repeated every 5-10 years through age 75 years, unless early forms of pre-cancerous   polyps or small growths are found.  Hepatitis C blood testing is recommended for all people born from 1945 through 1965 and any individual with known risks for hepatitis C.  Pra  Osteoporosis is a disease in which the bones lose minerals and strength with aging. This can result in serious bone fractures or breaks. The risk of osteoporosis can be identified using a bone density scan. Women ages 65 years and over and women at risk for fractures or osteoporosis should discuss screening with their health care providers. Ask your health care provider whether you should take a calcium supplement or vitamin D to reduce the rate of osteoporosis.  Menopause can be associated with physical symptoms and risks. Hormone replacement therapy is available to decrease symptoms and risks. You should talk to your  health care provider about whether hormone replacement therapy is right for you.  Use sunscreen. Apply sunscreen liberally and repeatedly throughout the day. You should seek shade when your shadow is shorter than you. Protect yourself by wearing long sleeves, pants, a wide-brimmed hat, and sunglasses year round, whenever you are outdoors.  Once a month, do a whole body skin exam, using a mirror to look at the skin on your back. Tell your health care provider of new moles, moles that have irregular borders, moles that are larger than a pencil eraser, or moles that have changed in shape or color.  Stay current with required vaccines (immunizations).  Influenza vaccine. All adults should be immunized every year.  Tetanus, diphtheria, and acellular pertussis (Td, Tdap) vaccine. Pregnant women should receive 1 dose of Tdap vaccine during each pregnancy. The dose should be obtained regardless of the length of time since the last dose. Immunization is preferred during the 27th-36th week of gestation. An adult who has not previously received Tdap or who does not know her vaccine status should receive 1 dose of Tdap. This initial dose should be followed by tetanus and diphtheria toxoids (Td) booster doses every 10 years. Adults with an unknown or incomplete history of completing a 3-dose immunization series with Td-containing vaccines should begin or complete a primary immunization series including a Tdap dose. Adults should receive a Td booster every 10 years.  Varicella vaccine. An adult without evidence of immunity to varicella should receive 2 doses or a second dose if she has previously received 1 dose. Pregnant females who do not have evidence of immunity should receive the first dose after pregnancy. This first dose should be obtained before leaving the health care facility. The second dose should be obtained 4-8 weeks after the first dose.  Human papillomavirus (HPV) vaccine. Females aged 13-26 years  who have not received the vaccine previously should obtain the 3-dose series. The vaccine is not recommended for use in pregnant females. However, pregnancy testing is not needed before receiving a dose. If a female is found to be pregnant after receiving a dose, no treatment is needed. In that case, the remaining doses should be delayed until after the pregnancy. Immunization is recommended for any person with an immunocompromised condition through the age of 26 years if she did not get any or all doses earlier. During the 3-dose series, the second dose should be obtained 4-8 weeks after the first dose. The third dose should be obtained 24 weeks after the first dose and 16 weeks after the second dose.  Zoster vaccine. One dose is recommended for adults aged 60 years or older unless certain conditions are present.  Measles, mumps, and rubella (  MMR) vaccine. Adults born before [birth year] generally are considered immune to measles and mumps. Adults born in [birth year] or later should have 1 or more doses of MMR vaccine unless there is a contraindication to the vaccine or there is laboratory evidence of immunity to each of the three diseases. A routine second dose of MMR vaccine should be obtained at least 28 days after the first dose for students attending postsecondary schools, health care workers, or international travelers. People who received inactivated measles vaccine or an unknown type of measles vaccine during 1963-1967 should receive 2 doses of MMR vaccine. People who received inactivated mumps vaccine or an unknown type of mumps vaccine before 1979 and are at high risk for mumps infection should consider immunization with 2 doses of MMR vaccine. For females of childbearing age, rubella immunity should be determined. If there is no evidence of immunity, females who are not pregnant should be vaccinated. If there is no evidence of immunity, females who are pregnant should delay immunization until after pregnancy.  Unvaccinated health care workers born before [birth year] who lack laboratory evidence of measles, mumps, or rubella immunity or laboratory confirmation of disease should consider measles and mumps immunization with 2 doses of MMR vaccine or rubella immunization with 1 dose of MMR vaccine.  Pneumococcal 13-valent conjugate (PCV13) vaccine. When indicated, a person who is uncertain of her immunization history and has no record of immunization should receive the PCV13 vaccine. An adult aged 39 years or older who has certain medical conditions and has not been previously immunized should receive 1 dose of PCV13 vaccine. This PCV13 should be followed with a dose of pneumococcal polysaccharide (PPSV23) vaccine. The PPSV23 vaccine dose should be obtained at least 8 weeks after the dose of PCV13 vaccine. An adult aged 62 years or older who has certain medical conditions and previously received 1 or more doses of PPSV23 vaccine should receive 1 dose of PCV13. The PCV13 vaccine dose should be obtained 1 or more years after the last PPSV23 vaccine dose.    Pneumococcal polysaccharide (PPSV23) vaccine. When PCV13 is also indicated, PCV13 should be obtained first. All adults aged 67 years and older should be immunized. An adult younger than age 45 years who has certain medical conditions should be immunized. Any person who resides in a nursing home or long-term care facility should be immunized. An adult smoker should be immunized. People with an immunocompromised condition and certain other conditions should receive both PCV13 and PPSV23 vaccines. People with human immunodeficiency virus (HIV) infection should be immunized as soon as possible after diagnosis. Immunization during chemotherapy or radiation therapy should be avoided. Routine use of PPSV23 vaccine is not recommended for American Indians, [place] Natives, or people younger than 65 years unless there are medical conditions that require PPSV23 vaccine. When indicated,  people who have unknown immunization and have no record of immunization should receive PPSV23 vaccine. One-time revaccination 5 years after the first dose of PPSV23 is recommended for people aged 19-64 years who have chronic kidney failure, nephrotic syndrome, asplenia, or immunocompromised conditions. People who received 1-2 doses of PPSV23 before age 23 years should receive another dose of PPSV23 vaccine at age 35 years or later if at least 5 years have passed since the previous dose. Doses of PPSV23 are not needed for people immunized with PPSV23 at or after age 38 years.  Preventive Services / Frequency   Ages 43 to 86 years  Blood pressure check.  Lipid and cholesterol check.  Lung  cancer screening. / Every year if you are aged 55-80 years and have a 30-pack-year history of smoking and currently smoke or have quit within the past 15 years. Yearly screening is stopped once you have quit smoking for at least 15 years or develop a health problem that would prevent you from having lung cancer treatment.  Clinical breast exam.** / Every year after age 40 years.  BRCA-related cancer risk assessment.** / For women who have family members with a BRCA-related cancer (breast, ovarian, tubal, or peritoneal cancers).  Mammogram.** / Every year beginning at age 40 years and continuing for as long as you are in good health. Consult with your health care provider.  Pap test.** / Every 3 years starting at age 30 years through age 65 or 70 years with a history of 3 consecutive normal Pap tests.  HPV screening.** / Every 3 years from ages 30 years through ages 65 to 70 years with a history of 3 consecutive normal Pap tests.  Fecal occult blood test (FOBT) of stool. / Every year beginning at age 50 years and continuing until age 75 years. You may not need to do this test if you get a colonoscopy every 10 years.  Flexible sigmoidoscopy or colonoscopy.** / Every 5 years for a flexible sigmoidoscopy or  every 10 years for a colonoscopy beginning at age 50 years and continuing until age 75 years.  Hepatitis C blood test.** / For all people born from 1945 through 1965 and any individual with known risks for hepatitis C.  Skin self-exam. / Monthly.  Influenza vaccine. / Every year.  Tetanus, diphtheria, and acellular pertussis (Tdap/Td) vaccine.** / Consult your health care provider. Pregnant women should receive 1 dose of Tdap vaccine during each pregnancy. 1 dose of Td every 10 years.  Varicella vaccine.** / Consult your health care provider. Pregnant females who do not have evidence of immunity should receive the first dose after pregnancy.  Zoster vaccine.** / 1 dose for adults aged 60 years or older.  Pneumococcal 13-valent conjugate (PCV13) vaccine.** / Consult your health care provider.  Pneumococcal polysaccharide (PPSV23) vaccine.** / 1 to 2 doses if you smoke cigarettes or if you have certain conditions.  Meningococcal vaccine.** / Consult your health care provider.  Hepatitis A vaccine.** / Consult your health care provider.  Hepatitis B vaccine.** / Consult your health care provider. Screening for abdominal aortic aneurysm (AAA)  by ultrasound is recommended for people over 50 who have history of high blood pressure or who are current or former smokers. ++++++++++++++++++ Recommend Adult Low Dose Aspirin or  coated  Aspirin 81 mg daily  To reduce risk of Colon Cancer 20 %,  Skin Cancer 26 % ,  Melanoma 46%  and  Pancreatic cancer 60% +++++++++++++++++++ Vitamin D goal  is between 70-100.  Please make sure that you are taking your Vitamin D as directed.  It is very important as a natural anti-inflammatory  helping hair, skin, and nails, as well as reducing stroke and heart attack risk.  It helps your bones and helps with mood. It also decreases numerous cancer risks so please take it as directed.  Low Vit D is associated with a 200-300% higher risk for CANCER  and  200-300% higher risk for HEART   ATTACK  &  STROKE.   ...................................... It is also associated with higher death rate at younger ages,  autoimmune diseases like Rheumatoid arthritis, Lupus, Multiple Sclerosis.    Also many other serious conditions, like depression,   Alzheimer's Dementia, infertility, muscle aches, fatigue, fibromyalgia - just to name a few. ++++++++++++++++++ Recommend the book "The END of DIETING" by Dr Joel Fuhrman  & the book "The END of DIABETES " by Dr Joel Fuhrman At [employer] - get book & Audio CD's    Being diabetic has a  300% increased risk for heart attack, stroke, cancer, and alzheimer- type vascular dementia. It is very important that you work harder with diet by avoiding all foods that are white. Avoid white rice (brown & wild rice is OK), white potatoes (sweetpotatoes in moderation is OK), White bread or wheat bread or anything made out of white flour like bagels, donuts, rolls, buns, biscuits, cakes, pastries, cookies, pizza crust, and pasta (made from white flour & egg whites) - vegetarian pasta or spinach or wheat pasta is OK. Multigrain breads like Arnold's or [employer], or multigrain sandwich thins or flatbreads.  Diet, exercise and weight loss can reverse and cure diabetes in the early stages.  Diet, exercise and weight loss is very important in the control and prevention of complications of diabetes which affects every system in your body, ie. Brain - dementia/stroke, eyes - glaucoma/blindness, heart - heart attack/heart failure, kidneys - dialysis, stomach - gastric paralysis, intestines - malabsorption, nerves - severe painful neuritis, circulation - gangrene & loss of a leg(s), and finally cancer and Alzheimers.    I recommend avoid fried & greasy foods,  sweets/candy, white rice (brown or wild rice or Quinoa is OK), white potatoes (sweet potatoes are OK) - anything made from white flour - bagels, doughnuts, rolls, buns, biscuits,white  and wheat breads, pizza crust and traditional pasta made of white flour & egg white(vegetarian pasta or spinach or wheat pasta is OK).  Multi-grain bread is OK - like multi-grain flat bread or sandwich thins. Avoid alcohol in excess. Exercise is also important.    Eat all the vegetables you want - avoid meat, especially red meat and dairy - especially cheese.  Cheese is the most concentrated form of trans-fats which is the worst thing to clog up our arteries. Veggie cheese is OK which can be found in the fresh produce section at [employer] or [employer] or [employer]  ++++++++++++++++++++++ DASH Eating Plan  DASH stands for "Dietary Approaches to Stop Hypertension."   The DASH eating plan is a healthy eating plan that has been shown to reduce high blood pressure (hypertension). Additional health benefits may include reducing the risk of type 2 diabetes mellitus, heart disease, and stroke. The DASH eating plan may also help with weight loss. WHAT DO I NEED TO KNOW ABOUT THE DASH EATING PLAN? For the DASH eating plan, you will follow these general guidelines:  Choose foods with a percent daily value for sodium of less than 5% (as listed on the food label).  Use salt-free seasonings or herbs instead of table salt or sea salt.  Check with your health care provider or pharmacist before using salt substitutes.  Eat lower-sodium products, often labeled as "lower sodium" or "no salt added."  Eat fresh foods.  Eat more vegetables, fruits, and low-fat dairy products.  Choose whole grains. Look for the word "whole" as the first word in the ingredient list.  Choose fish   Limit sweets, desserts, sugars, and sugary drinks.  Choose heart-healthy fats.  Eat veggie cheese   Eat more home-cooked food and less restaurant, buffet, and fast food.  Limit fried foods.  Cook foods using methods other than frying.  Limit canned   vegetables. If you do use them, rinse them well to decrease the  sodium.  When eating at a restaurant, ask that your food be prepared with less salt, or no salt if possible.                      WHAT FOODS CAN I EAT? Read Dr Joel Fuhrman's books on The End of Dieting & The End of Diabetes  Grains Whole grain or whole wheat bread. Brown rice. Whole grain or whole wheat pasta. Quinoa, bulgur, and whole grain cereals. Low-sodium cereals. Corn or whole wheat flour tortillas. Whole grain cornbread. Whole grain crackers. Low-sodium crackers.  Vegetables Fresh or frozen vegetables (raw, steamed, roasted, or grilled). Low-sodium or reduced-sodium tomato and vegetable juices. Low-sodium or reduced-sodium tomato sauce and paste. Low-sodium or reduced-sodium canned vegetables.   Fruits All fresh, canned (in natural juice), or frozen fruits.  Protein Products  All fish and seafood.  Dried beans, peas, or lentils. Unsalted nuts and seeds. Unsalted canned beans.  Dairy Low-fat dairy products, such as skim or 1% milk, 2% or reduced-fat cheeses, low-fat ricotta or cottage cheese, or plain low-fat yogurt. Low-sodium or reduced-sodium cheeses.  Fats and Oils Tub margarines without trans fats. Light or reduced-fat mayonnaise and salad dressings (reduced sodium). Avocado. Safflower, olive, or canola oils. Natural peanut or almond butter.  Other Unsalted popcorn and pretzels. The items listed above may not be a complete list of recommended foods or beverages. Contact your dietitian for more options.  ++++++++++++++++++  WHAT FOODS ARE NOT RECOMMENDED? Grains/ White flour or wheat flour White bread. White pasta. White rice. Refined cornbread. Bagels and croissants. Crackers that contain trans fat.  Vegetables  Creamed or fried vegetables. Vegetables in a . Regular canned vegetables. Regular canned tomato sauce and paste. Regular tomato and vegetable juices.  Fruits Dried fruits. Canned fruit in light or heavy syrup. Fruit juice.  Meat and Other Protein  Products Meat in general - RED meat & White meat.  Fatty cuts of meat. Ribs, chicken wings, all processed meats as bacon, sausage, bologna, salami, fatback, hot dogs, bratwurst and packaged luncheon meats.  Dairy Whole or 2% milk, cream, half-and-half, and cream cheese. Whole-fat or sweetened yogurt. Full-fat cheeses or blue cheese. Non-dairy creamers and whipped toppings. Processed cheese, cheese spreads, or cheese curds.  Condiments Onion and garlic salt, seasoned salt, table salt, and sea salt. Canned and packaged gravies. Worcestershire sauce. Tartar sauce. Barbecue sauce. Teriyaki sauce. Soy sauce, including reduced sodium. Steak sauce. Fish sauce. Oyster sauce. Cocktail sauce. Horseradish. Ketchup and mustard. Meat flavorings and tenderizers. Bouillon cubes. Hot sauce. Tabasco sauce. Marinades. Taco seasonings. Relishes.  Fats and Oils Butter, stick margarine, lard, shortening and bacon fat. Coconut, palm kernel, or palm oils. Regular salad dressings.  Pickles and olives. Salted popcorn and pretzels.  The items listed above may not be a complete list of foods and beverages to avoid.    

## 2015-11-06 NOTE — Progress Notes (Signed)
[facility] [name], M.D.    Uvaldo Bristle. Silverio Lay, P.A.-C      Starlyn Skeans, P.A.-C  [facility]                [address] [place], [place] [id number] Telephone [phone] Telefax [phone]  Annual Screening/Preventative Visit & Comprehensive Evaluation &  Examination     This very nice 60 y.o. WBF presents for a Screening/Preventative Visit & comprehensive evaluation and management of multiple medical co-morbidities.  Patient has been followed for HTN, T2_NIDDM  , Hyperlipidemia and Vitamin D Deficiency.      HTN predates circa 2003. Patient's BP has been controlled at home and patient denies any cardiac symptoms as chest pain, palpitations, shortness of breath, dizziness or ankle swelling. Today's BP is 122/66      Patient's hyperlipidemia is controlled with diet and medications. Patient denies myalgias or other medication SE's. Last lipids were at goal: Lab Results  Component Value Date   CHOL 146 07/24/2015   HDL 54 07/24/2015   LDLCALC 79 07/24/2015   TRIG 64 07/24/2015   CHOLHDL 2.7 07/24/2015      Patient has [place] and patient denies reactive hypoglycemic symptoms, visual blurring, diabetic polys, or paresthesias.  She alleges CBG's range betw 70-130's . Last A1c was not at goal: Lab Results  Component Value Date   HGBA1C 6.8 (H) 07/24/2015      Finally, patient has history of Vitamin D Deficiency of "30 " in 2008 and last Vitamin D was at goal: Lab Results  Component Value Date   VD25OH 88 07/24/2015   Current Outpatient Prescriptions on File Prior to Visit  Medication Sig  . albuterol (PROVENTIL HFA;VENTOLIN HFA) 108 (90 Base) MCG/ACT inhaler Use 1  To 2 inhalations 5 minutes apart every 4 hours if needed to rescue asthma  . aspirin EC 81 MG tablet Take 81 mg by mouth daily.  . SUPER B COMPLEX  Take 1 tablet by mouth daily. Takes every other day  . VITAMIN  D 2000 UNITS  Take 1 capsule by mouth 2 (two) times daily.  . Cinnamon 500 MG capsule Take 500 mg by mouth daily.  . ferrous sulfate 325 (65 FE) MG  Take 325 mg by mouth daily with breakfast.   . FIBER FORMULA PO Take 1 tablet by mouth daily.   [name] gabapentin  100 MG capsule Take 1-3 pills at night  . INVOKANA 300 MG TABS tablet TAKE 1 TABLET BY MOUTH DAILY BEFORE BREAKFAST  . lisinopril-hctz 10-12.5 MG tablet TAKE 1 TABLET BY MOUTH EVERY MORNING  . MAGNESIUM PO Take 2 tablets by mouth daily.  . meloxicam  15 MG tablet Take 1 tablet (15 mg total) by mouth as needed for pain.  . metFORMIN  1000 MG tablet TAKE 1 BY MOUTH TWICE DAILY WITH A MEAL  . metoprolol succinate-XL 25 MG  TAKE 1 BY MOUTH DAILY  . MultiVit w/Min Take 1 tablet by mouth daily.  . Omega-3 FISH OIL 1000 MG CAPS Take 1 capsule (1,000 mg total) by mouth daily.  . Tumeric 500 mg  daily   Soft stool  1 daily  . potassium chloride  20 MEQ tablet Take 1 tab 3 times daily.  . pravastatin  40 MG tablet TAKE 1 BY MOUTH EVERY OTHER DAY  . ranitidine (ZANTAC) 300 MG tablet TAKE  1 BY MOUTH DAILY  . traMADol (ULTRAM) 50 MG tablet Take 1 tablet (50 mg total) by mouth as needed.  [name] NASONEX nasal spray Place 2 sprays into the nose daily.   Allergies  Allergen Reactions  . Naprosyn [Naproxen] GI upset   Past Medical History:  Diagnosis Date  . Allergic rhinitis   . ASTHMA   . BICUSPID AORTIC VALVE   . CEREBROVASCULAR DISEASE   . DEGENERATIVE JOINT DISEASE   . Diabetes mellitus   . DJD (degenerative joint disease)   . GERD   . HSV-1 (herpes simplex virus 1) infection   . HYPERLIPIDEMIA   . Hypertension   . Palpitation   . Vitamin D deficiency    Health Maintenance  Topic Date Due  . PNEUMOCOCCAL POLYSACCHARIDE VACCINE (2) 05/16/2001  . TETANUS/TDAP  03/25/2014  . INFLUENZA VACCINE  09/16/2015  . ZOSTAVAX  10/28/2015  . FOOT EXAM  10/11/2015  . HEMOGLOBIN A1C  01/23/2016  . OPHTHALMOLOGY EXAM  07/17/2016  . PAP SMEAR   07/27/2016  . MAMMOGRAM  10/16/2017  . COLONOSCOPY  01/27/2025  . Hepatitis C Screening  Completed  . HIV Screening  Completed   Immunization History  Administered Date(s) Administered  . DT 10/11/2014  . DTaP 03/18/2004  . Influenza Split 11/15/2013, 11/30/2014  . Influenza Whole 11/02/2012  . Influenza-Unspecified 10/31/2015  . PPD Test 07/30/2013, 10/11/2014  . Pneumococcal Conjugate-13 11/15/2013  . Pneumococcal Polysaccharide-23 05/16/1996   Past Surgical History:  Procedure Laterality Date  . ABDOMINAL HYSTERECTOMY    . repair of trigger finger     Family History  Problem Relation Age of Onset  . Coronary artery disease Mother   . Heart disease Mother   . Hypertension Mother   . Diabetes Father   . Hypertension Father    Social History  Substance Use Topics  . Smoking status: Never Smoker  . Smokeless tobacco: Not on file  . Alcohol use Yes     Comment: very rarely    ROS Constitutional: Denies fever, chills, weight loss/gain, headaches, insomnia,  night sweats, and change in appetite. Does c/o fatigue. Eyes: Denies redness, blurred vision, diplopia, discharge, itchy, watery eyes.  ENT: Denies discharge, congestion, post nasal drip, epistaxis, sore throat, earache, hearing loss, dental pain, Tinnitus, Vertigo, Sinus pain, snoring.  Cardio: Denies chest pain, palpitations, irregular heartbeat, syncope, dyspnea, diaphoresis, orthopnea, PND, claudication, edema Respiratory: denies cough, dyspnea, DOE, pleurisy, hoarseness, laryngitis, wheezing.  Gastrointestinal: Denies dysphagia, heartburn, reflux, water brash, pain, cramps, nausea, vomiting, bloating, diarrhea, constipation, hematemesis, melena, hematochezia, jaundice, hemorrhoids Genitourinary: Denies dysuria, frequency, urgency, nocturia, hesitancy, discharge, hematuria, flank pain Breast: Breast lumps, nipple discharge, bleeding.  Musculoskeletal: Denies arthralgia, myalgia, stiffness, Jt. Swelling, pain, limp,  and strain/sprain. Denies falls. Skin: Denies puritis, rash, hives, warts, acne, eczema, changing in skin lesion Neuro: No weakness, tremor, incoordination, spasms, paresthesia, pain Psychiatric: Denies confusion, memory loss, sensory loss. Denies Depression. Endocrine: Denies change in weight, skin, hair change, nocturia, and paresthesia, diabetic polys, visual blurring, hyper / hypo glycemic episodes.  Heme/Lymph: No excessive bleeding, bruising, enlarged lymph nodes.  Physical Exam  BP 122/66   Pulse 80   Temp 97 F (36.1 C)   Resp 16   Ht 4\' 10"  (1.473 m)   Wt 129 lb 3.2 oz (58.6 kg)   BMI 27.00 kg/m   General Appearance: Well nourished and in no apparent distress.  Eyes:  Bilat pseudophakia, PERRLA, Rt Esotropia, &  conjunctiva no swelling or erythema, normal fundi and  vessels. Sinuses: No frontal/maxillary tenderness ENT/Mouth: EACs patent / TMs  nl. Nares clear without erythema, swelling, mucoid exudates. Oral hygiene is good. No erythema, swelling, or exudate. Tongue normal, non-obstructing. Tonsils not swollen or erythematous. Hearing normal.  Neck: Supple, thyroid normal. No bruits, nodes or JVD. Respiratory: Respiratory effort normal.  BS equal and clear bilateral without rales, rhonci, wheezing or stridor. Cardio: Heart sounds are normal with regular rate and rhythm and no murmurs, rubs or gallops. Peripheral pulses are normal and equal bilaterally without edema. No aortic or femoral bruits. Chest: symmetric with normal excursions and percussion. Breasts: Symmetric, without lumps, nipple discharge, retractions, or fibrocystic changes.  Abdomen: Flat, soft with bowel sounds active. Nontender, no guarding, rebound, hernias, masses, or organomegaly.  Lymphatics: Non tender without lymphadenopathy.  Musculoskeletal: Full ROM all peripheral extremities, joint stability, 5/5 strength, and normal gait. Skin: Warm and dry without rashes, lesions, cyanosis, clubbing or  ecchymosis.   Neuro: Cranial nerves intact, reflexes equal bilaterally. Normal muscle tone, no cerebellar symptoms. Sensation intact to touch, Vibratory & Monofilament testing to the toes bilat.  Pysch: Alert and oriented X 3, normal affect, Insight and Judgment appropriate.   Assessment and Plan  1. Annual Preventative Screening Examination  - Microalbumin / creatinine urine ratio - EKG 12-Lead - [place], RETROPERITNL ABD,  LTD - POC Hemoccult Bld/Stl  - Urinalysis, Routine w reflex microscopic  - Vitamin B12 - Iron and TIBC - HM DIABETES FOOT EXAM - LOW EXTREMITY NEUR EXAM DOCUM - CBC with Differential/Platelet - BASIC METABOLIC PANEL WITH GFR - Hepatic function panel - Magnesium - Lipid panel - TSH - Hemoglobin A1c - Insulin, random - VITAMIN D 25 Hydroxy   2. Essential hypertension  - EKG 12-Lead - [place], RETROPERITNL ABD,  LTD - TSH  3. Hyperlipidemia  - EKG 12-Lead - [place], RETROPERITNL ABD,  LTD - Lipid panel - TSH  4. Type 2 diabetes mellitus without complication, without long-term current use of insulin (HCC)  - Microalbumin / creatinine urine ratio - EKG 12-Lead - [place], RETROPERITNL ABD,  LTD - HM DIABETES FOOT EXAM - LOW EXTREMITY NEUR EXAM DOCUM - Hemoglobin A1c - Insulin, random  5. Vitamin D deficiency  - VITAMIN D 25 Hydroxy   6. Gastroesophageal reflux disease    7. Screening for rectal cancer  - POC Hemoccult Bld/Stl   8. Screening for AAA (aortic abdominal aneurysm)  - [place], RETROPERITNL ABD,  LTD  9. Screening for ischemic heart disease  - EKG 12-Lead  10. Other fatigue  - Vitamin B12 - Iron and TIBC - CBC with Differential/Platelet - TSH  11. Medication management  - Urinalysis, Routine w reflex microscopic  - CBC with Differential/Platelet - BASIC METABOLIC PANEL WITH GFR - Magnesium  12. Need for prophylactic vaccination and inoculation against influenza  - PPD   Continue prudent diet as discussed, weight control, BP monitoring, regular  exercise, and medications. Discussed med's effects and SE's. Screening labs and tests as requested with regular follow-up as recommended. Over 40 minutes of exam, counseling, chart review and high complex critical decision making was performed.

## 2015-11-07 ENCOUNTER — Other Ambulatory Visit: Payer: Self-pay | Admitting: Internal Medicine

## 2015-11-07 DIAGNOSIS — N390 Urinary tract infection, site not specified: Secondary | ICD-10-CM

## 2015-11-07 LAB — URINALYSIS, MICROSCOPIC ONLY
Bacteria, UA: NONE SEEN [HPF]
Casts: NONE SEEN [LPF]
Crystals: NONE SEEN [HPF]
RBC / HPF: NONE SEEN RBC/HPF (ref <=2)
Squamous Epithelial / LPF: NONE SEEN [HPF] (ref <=5)
Yeast: NONE SEEN [HPF]

## 2015-11-07 LAB — URINALYSIS, ROUTINE W REFLEX MICROSCOPIC
Bilirubin Urine: NEGATIVE
Hgb urine dipstick: NEGATIVE
Ketones, ur: NEGATIVE
Nitrite: NEGATIVE
Protein, ur: NEGATIVE
Specific Gravity, Urine: 1.021 (ref 1.001–1.035)
pH: 6.5 (ref 5.0–8.0)

## 2015-11-07 LAB — MICROALBUMIN / CREATININE URINE RATIO
Creatinine, Urine: 34 mg/dL (ref 20–320)
Microalb, Ur: <0.2 mg/dL

## 2015-11-07 LAB — HEMOGLOBIN A1C
Hgb A1c MFr Bld: 6.5 % — ABNORMAL HIGH (ref <5.7)
Mean Plasma Glucose: 140 mg/dL

## 2015-11-07 LAB — VITAMIN D 25 HYDROXY (VIT D DEFICIENCY, FRACTURES): Vit D, 25-Hydroxy: 71 ng/mL (ref 30–100)

## 2015-11-07 LAB — INSULIN, RANDOM: Insulin: 23.2 u[IU]/mL — ABNORMAL HIGH (ref 2.0–19.6)

## 2015-11-07 MED ORDER — CIPROFLOXACIN HCL 250 MG PO TABS: ORAL_TABLET | ORAL | 0 refills | Status: AC

## 2015-11-08 ENCOUNTER — Other Ambulatory Visit: Payer: Self-pay | Admitting: Internal Medicine

## 2015-11-10 LAB — TB SKIN TEST
Induration: 0 mm
TB Skin Test: NEGATIVE

## 2015-12-11 ENCOUNTER — Ambulatory Visit (INDEPENDENT_AMBULATORY_CARE_PROVIDER_SITE_OTHER): Payer: BLUE CROSS/BLUE SHIELD | Admitting: *Deleted

## 2015-12-11 DIAGNOSIS — N390 Urinary tract infection, site not specified: Secondary | ICD-10-CM | POA: Diagnosis not present

## 2015-12-11 MED ORDER — PRAVASTATIN SODIUM 40 MG PO TABS: ORAL_TABLET | ORAL | 3 refills | Status: DC

## 2015-12-11 NOTE — Addendum Note (Signed)
Addended by: Sui Kasparek A on: 12/11/2015 08:48 AM   Modules accepted: Orders

## 2015-12-11 NOTE — Progress Notes (Signed)
Patient presents for 1 month recheck UA, C&S.  

## 2015-12-12 LAB — URINALYSIS, ROUTINE W REFLEX MICROSCOPIC
Bilirubin Urine: NEGATIVE
Hgb urine dipstick: NEGATIVE
Ketones, ur: NEGATIVE
Nitrite: NEGATIVE
Protein, ur: NEGATIVE
Specific Gravity, Urine: 1.016 (ref 1.001–1.035)
pH: 7.5 (ref 5.0–8.0)

## 2015-12-12 LAB — URINALYSIS, MICROSCOPIC ONLY
Bacteria, UA: NONE SEEN [HPF]
Casts: NONE SEEN [LPF]
Crystals: NONE SEEN [HPF]
RBC / HPF: NONE SEEN RBC/HPF (ref <=2)
Squamous Epithelial / LPF: NONE SEEN [HPF] (ref <=5)
Yeast: NONE SEEN [HPF]

## 2015-12-12 LAB — URINE CULTURE: Organism ID, Bacteria: NO GROWTH

## 2015-12-18 ENCOUNTER — Other Ambulatory Visit: Payer: Self-pay | Admitting: *Deleted

## 2015-12-18 MED ORDER — PRAVASTATIN SODIUM 40 MG PO TABS: ORAL_TABLET | ORAL | 3 refills | Status: DC

## 2016-01-03 ENCOUNTER — Other Ambulatory Visit: Payer: Self-pay | Admitting: Internal Medicine

## 2016-01-03 DIAGNOSIS — E119 Type 2 diabetes mellitus without complications: Secondary | ICD-10-CM

## 2016-01-03 MED ORDER — CANAGLIFLOZIN 300 MG PO TABS: ORAL_TABLET | ORAL | 1 refills | Status: DC

## 2016-01-13 ENCOUNTER — Other Ambulatory Visit: Payer: Self-pay | Admitting: Internal Medicine

## 2016-01-13 ENCOUNTER — Other Ambulatory Visit: Payer: Self-pay | Admitting: *Deleted

## 2016-01-13 DIAGNOSIS — E119 Type 2 diabetes mellitus without complications: Secondary | ICD-10-CM

## 2016-01-13 MED ORDER — CANAGLIFLOZIN 300 MG PO TABS: ORAL_TABLET | ORAL | 2 refills | Status: DC

## 2016-01-21 ENCOUNTER — Other Ambulatory Visit: Payer: Self-pay | Admitting: *Deleted

## 2016-01-21 MED ORDER — RANITIDINE HCL 300 MG PO TABS: ORAL_TABLET | ORAL | 1 refills | Status: DC

## 2016-01-21 MED ORDER — METFORMIN HCL 1000 MG PO TABS: ORAL_TABLET | ORAL | 1 refills | Status: DC

## 2016-01-21 MED ORDER — LISINOPRIL-HYDROCHLOROTHIAZIDE 10-12.5 MG PO TABS: 1.0000 | ORAL_TABLET | Freq: Every morning | ORAL | 1 refills | Status: DC

## 2016-01-29 DIAGNOSIS — E119 Type 2 diabetes mellitus without complications: Secondary | ICD-10-CM | POA: Diagnosis not present

## 2016-02-13 ENCOUNTER — Ambulatory Visit: Payer: Self-pay | Admitting: Internal Medicine

## 2016-02-19 ENCOUNTER — Encounter: Payer: Self-pay | Admitting: Internal Medicine

## 2016-02-19 ENCOUNTER — Ambulatory Visit (INDEPENDENT_AMBULATORY_CARE_PROVIDER_SITE_OTHER): Payer: BLUE CROSS/BLUE SHIELD | Admitting: Internal Medicine

## 2016-02-19 VITALS — BP 132/64 | HR 86 | Temp 98.0°F | Resp 16 | Ht <= 58 in | Wt 130.0 lb

## 2016-02-19 DIAGNOSIS — I10 Essential (primary) hypertension: Secondary | ICD-10-CM

## 2016-02-19 DIAGNOSIS — M79604 Pain in right leg: Secondary | ICD-10-CM | POA: Diagnosis not present

## 2016-02-19 DIAGNOSIS — M19049 Primary osteoarthritis, unspecified hand: Secondary | ICD-10-CM

## 2016-02-19 DIAGNOSIS — Z79899 Other long term (current) drug therapy: Secondary | ICD-10-CM

## 2016-02-19 DIAGNOSIS — E782 Mixed hyperlipidemia: Secondary | ICD-10-CM | POA: Diagnosis not present

## 2016-02-19 DIAGNOSIS — H43399 Other vitreous opacities, unspecified eye: Secondary | ICD-10-CM

## 2016-02-19 DIAGNOSIS — E119 Type 2 diabetes mellitus without complications: Secondary | ICD-10-CM

## 2016-02-19 DIAGNOSIS — M79605 Pain in left leg: Secondary | ICD-10-CM

## 2016-02-19 DIAGNOSIS — E559 Vitamin D deficiency, unspecified: Secondary | ICD-10-CM

## 2016-02-19 LAB — HEMOGLOBIN A1C
Hgb A1c MFr Bld: 6.7 % — ABNORMAL HIGH (ref <5.7)
Mean Plasma Glucose: 146 mg/dL

## 2016-02-19 LAB — CBC WITH DIFFERENTIAL/PLATELET
Basophils Absolute: 0 cells/uL (ref 0–200)
Basophils Relative: 0 %
Eosinophils Absolute: 150 cells/uL (ref 15–500)
Eosinophils Relative: 2 %
HCT: 43.4 % (ref 35.0–45.0)
Hemoglobin: 14.2 g/dL (ref 11.7–15.5)
Lymphocytes Relative: 30 %
Lymphs Abs: 2250 cells/uL (ref 850–3900)
MCH: 31.3 pg (ref 27.0–33.0)
MCHC: 32.7 g/dL (ref 32.0–36.0)
MCV: 95.6 fL (ref 80.0–100.0)
MPV: 10.7 fL (ref 7.5–12.5)
Monocytes Absolute: 525 cells/uL (ref 200–950)
Monocytes Relative: 7 %
Neutro Abs: 4575 cells/uL (ref 1500–7800)
Neutrophils Relative %: 61 %
Platelets: 273 10*3/uL (ref 140–400)
RBC: 4.54 MIL/uL (ref 3.80–5.10)
RDW: 13.3 % (ref 11.0–15.0)
WBC: 7.5 10*3/uL (ref 3.8–10.8)

## 2016-02-19 LAB — BASIC METABOLIC PANEL WITH GFR
BUN: 17 mg/dL (ref 7–25)
CO2: 25 mmol/L (ref 20–31)
Calcium: 9.1 mg/dL (ref 8.6–10.4)
Chloride: 106 mmol/L (ref 98–110)
Creat: 0.62 mg/dL (ref 0.50–0.99)
GFR, Est African American: >89 mL/min (ref >=60)
GFR, Est Non African American: >89 mL/min (ref >=60)
Glucose, Bld: 99 mg/dL (ref 65–99)
Potassium: 4.2 mmol/L (ref 3.5–5.3)
Sodium: 141 mmol/L (ref 135–146)

## 2016-02-19 LAB — HEPATIC FUNCTION PANEL
ALT: 13 U/L (ref 6–29)
AST: 16 U/L (ref 10–35)
Albumin: 4.2 g/dL (ref 3.6–5.1)
Alkaline Phosphatase: 65 U/L (ref 33–130)
Bilirubin, Direct: 0.1 mg/dL (ref <=0.2)
Indirect Bilirubin: 0.1 mg/dL — ABNORMAL LOW (ref 0.2–1.2)
Total Bilirubin: 0.2 mg/dL (ref 0.2–1.2)
Total Protein: 6.8 g/dL (ref 6.1–8.1)

## 2016-02-19 LAB — LIPID PANEL
Cholesterol: 147 mg/dL (ref <200)
HDL: 48 mg/dL — ABNORMAL LOW (ref >50)
LDL Cholesterol: 83 mg/dL (ref <100)
Total CHOL/HDL Ratio: 3.1 Ratio (ref <5.0)
Triglycerides: 81 mg/dL (ref <150)
VLDL: 16 mg/dL (ref <30)

## 2016-02-19 LAB — TSH: TSH: 1.36 mIU/L

## 2016-02-19 MED ORDER — DICLOFENAC SODIUM 1.5 % TD SOLN: TRANSDERMAL | 0 refills | Status: DC

## 2016-02-19 MED ORDER — FUROSEMIDE 20 MG PO TABS: 20.0000 mg | ORAL_TABLET | Freq: Every day | ORAL | 0 refills | Status: DC

## 2016-02-19 NOTE — Progress Notes (Signed)
Assessment and Plan:  Hypertension:  -Continue medication,  -monitor blood pressure at home.  -Continue DASH diet.   -Reminder to go to the ER if any CP, SOB, nausea, dizziness, severe HA, changes vision/speech, left arm numbness and tingling, and jaw pain.  Cholesterol: -Continue diet and exercise.  -Check cholesterol.   Diabetes without complication: -Continue diet and exercise.  -Check A1C  Vitamin D Def: -continue medications.   Bilateral 1st CMC arthritis -Voltaren gel  Bilateral leg pain -? Secondary to varicose veins -try 20 mg lasix in addition to compression socks -if no relief in a week than we can go ahead and get referral in to vascular -warned patient that given her invokana use that she really needs to make sure she drinks 4-5 water bottles per day to avoid dehydration  Floater -following with opthalmology -watchful waiting.    Continue diet and meds as discussed. Further disposition pending results of labs.  HPI 61 y.o. female  presents for 3 month follow up with hypertension, hyperlipidemia, prediabetes and vitamin D.   Her blood pressure has been controlled at home, today their BP is BP: 132/64.   She does workout. She denies chest pain, shortness of breath, dizziness.   She is on cholesterol medication and denies myalgias. Her cholesterol is at goal. The cholesterol last visit was:   Lab Results  Component Value Date   CHOL 144 11/06/2015   HDL 55 11/06/2015   LDLCALC 75 11/06/2015   TRIG 70 11/06/2015   CHOLHDL 2.6 11/06/2015     She has been working on diet and exercise for diabetes, and denies foot ulcerations, hyperglycemia, hypoglycemia , increased appetite, nausea, paresthesia of the feet, polydipsia, polyuria, visual disturbances, vomiting and weight loss. Last A1C in the office was:  Lab Results  Component Value Date   HGBA1C 6.5 (H) 11/06/2015  She is doing well with blood sugars.  She is generally running around 120.    Patient is on  Vitamin D supplement.  Lab Results  Component Value Date   VD25OH 58 11/06/2015      She notes that she is having some leg pain in her bilateral lower legs that have been bothersome for the last 3-6 months.  She reports that she has this despite taking the gabapentin.  She reports that she is trying to not take it frequently.  She does find some swelling in the legs.  She has been wearing the compression socks and do find that these help at lot.  She reports that the knee high socks work much better.  She reports that she did recently go to the eye doctor for a floater.  She reports that she is due to see them in 6 months.  She reports that right now they are doing watchful waiting.     She notes that she is having bilateral thumb pain.  She knows she has carpal tunnel in one hand but she can't have the surgery since she can't afford it right now.   Current Medications:  Current Outpatient Prescriptions on File Prior to Visit  Medication Sig Dispense Refill  . albuterol (PROVENTIL HFA;VENTOLIN HFA) 108 (90 Base) MCG/ACT inhaler Use 1  To 2 inhalations 5 minutes apart every 4 hours if needed to rescue asthma 48 g 99  . aspirin EC 81 MG tablet Take 81 mg by mouth daily.    . B Complex-C (SUPER B COMPLEX PO) Take 1 tablet by mouth daily. Takes every other day    .  Blood Glucose Monitoring Suppl (GMATE SMART STARTER) w/Device KIT Check blood sugar 1 time daily-DX-E11.9. 1 kit 0  . canagliflozin (INVOKANA) 300 MG TABS tablet TAKE 1 TABLET BY MOUTH EVERY DAY FOR DIABETES 90 tablet 2  . Cholecalciferol (VITAMIN D) 2000 UNITS CAPS Take 1 capsule by mouth 2 (two) times daily.    . Cinnamon 500 MG capsule Take 500 mg by mouth daily.    . ferrous sulfate 325 (65 FE) MG tablet Take 325 mg by mouth daily with breakfast.     . FIBER FORMULA PO Take 1 tablet by mouth daily.     . fluconazole (DIFLUCAN) 150 MG tablet Use once for yeast infection as needed 2 tablet 3  . gabapentin (NEURONTIN) 100 MG  capsule Take 1-3 pills at night 90 capsule 0  . glucose blood (GMATE BLOOD GLUCOSE TEST) test strip Check blood sugar 1 time daily-DX-E11.9. 100 each 4  . GMATE LANCETS 30G MISC Check blood sugar 1 time daily-DX-E11.9 100 each 4  . lisinopril-hydrochlorothiazide (PRINZIDE,ZESTORETIC) 10-12.5 MG tablet Take 1 tablet by mouth every morning. 90 tablet 1  . MAGNESIUM PO Take 2 tablets by mouth daily.    . meloxicam (MOBIC) 15 MG tablet Take 1 tablet (15 mg total) by mouth as needed for pain. 90 tablet 1  . metFORMIN (GLUCOPHAGE) 1000 MG tablet TAKE 1 BY MOUTH TWICE DAILY WITH A MEAL 180 tablet 1  . metoprolol succinate (TOPROL-XL) 25 MG 24 hr tablet TAKE 1 BY MOUTH DAILY 90 tablet 1  . Multiple Vitamin (MULITIVITAMIN WITH MINERALS) TABS Take 1 tablet by mouth daily.    . Omega-3 Fatty Acids (FISH OIL) 1000 MG CAPS Take 1 capsule (1,000 mg total) by mouth daily.  0  . OVER THE COUNTER MEDICATION 500 mg daily. Tumeric 500 mg daily    . OVER THE COUNTER MEDICATION Soft stool 1 daily    . potassium chloride SA (KLOR-CON M20) 20 MEQ tablet Take 1 tablet (20 mEq total) by mouth 3 (three) times daily. 270 tablet 1  . pravastatin (PRAVACHOL) 40 MG tablet TAKE 1 BY MOUTH EVERY DAY OR AS DIRECTED. 90 tablet 3  . ranitidine (ZANTAC) 300 MG tablet TAKE 1 BY MOUTH DAILY 90 tablet 1  . traMADol (ULTRAM) 50 MG tablet Take 1 tablet (50 mg total) by mouth as needed. 90 tablet 0  . mometasone (NASONEX) 50 MCG/ACT nasal spray Place 2 sprays into the nose daily. 51 g 99   No current facility-administered medications on file prior to visit.     Medical History:  Past Medical History:  Diagnosis Date  . Allergic rhinitis   . ASTHMA   . BICUSPID AORTIC VALVE   . CEREBROVASCULAR DISEASE   . DEGENERATIVE JOINT DISEASE   . Diabetes mellitus   . DJD (degenerative joint disease)   . GERD   . HSV-1 (herpes simplex virus 1) infection   . HYPERLIPIDEMIA   . Hypertension   . Palpitation   . Vitamin D deficiency      Allergies:  Allergies  Allergen Reactions  . Naprosyn [Naproxen] Other (See Comments)    GI upset     Review of Systems:  Review of Systems  Constitutional: Negative for chills, fever and malaise/fatigue.  HENT: Negative for congestion, ear pain and sore throat.   Eyes: Negative.   Respiratory: Negative for cough, shortness of breath and wheezing.   Cardiovascular: Negative for chest pain, palpitations and leg swelling.  Gastrointestinal: Negative for abdominal pain, blood in stool,  constipation, diarrhea, heartburn and melena.  Genitourinary: Negative.   Musculoskeletal: Positive for joint pain.  Skin: Negative.   Neurological: Negative for dizziness, sensory change, loss of consciousness and headaches.  Psychiatric/Behavioral: Negative for depression. The patient is not nervous/anxious and does not have insomnia.     Family history- Review and unchanged  Social history- Review and unchanged  Physical Exam: BP 132/64   Pulse 86   Temp 98 F (36.7 C) (Temporal)   Resp 16   Ht 4' 10"  (1.473 m)   Wt 130 lb (59 kg)   BMI 27.17 kg/m  Wt Readings from Last 3 Encounters:  02/19/16 130 lb (59 kg)  11/06/15 129 lb 3.2 oz (58.6 kg)  07/24/15 128 lb 6.4 oz (58.2 kg)    General Appearance: Well nourished well developed, in no apparent distress. Eyes: PERRLA, EOMs, conjunctiva no swelling or erythema ENT/Mouth: Ear canals normal without obstruction, swelling, erythma, discharge.  TMs normal bilaterally.  Oropharynx moist, clear, without exudate, or postoropharyngeal swelling. Neck: Supple, thyroid normal,no cervical adenopathy  Respiratory: Respiratory effort normal, Breath sounds clear A&P without rhonchi, wheeze, or rale.  No retractions, no accessory usage. Cardio: RRR with no MRGs. Brisk peripheral pulses without edema.  Abdomen: Soft, + BS,  Non tender, no guarding, rebound, hernias, masses. Musculoskeletal: Full ROM, 5/5 strength, Normal gait.  Bilateral lower  extremities with some minimal swelling. There are bilateral varicose veins present.  Non-tender to palpation.  Bilateral knee exams without tenderness to palpation.  Full active and passive ROM.  Normal ligamentous exams.   Skin: Warm, dry without rashes, lesions, ecchymosis.  Neuro: Awake and oriented X 3, Cranial nerves intact. Normal muscle tone, no cerebellar symptoms. Psych: Normal affect, Insight and Judgment appropriate.    Starlyn Skeans, PA-C 9:26 AM [facility]

## 2016-03-05 ENCOUNTER — Other Ambulatory Visit: Payer: Self-pay | Admitting: Internal Medicine

## 2016-03-05 MED ORDER — MELOXICAM 15 MG PO TABS: 15.0000 mg | ORAL_TABLET | ORAL | 1 refills | Status: DC | PRN

## 2016-03-05 MED ORDER — GABAPENTIN 100 MG PO CAPS: ORAL_CAPSULE | ORAL | 0 refills | Status: DC

## 2016-03-10 ENCOUNTER — Other Ambulatory Visit: Payer: Self-pay

## 2016-03-10 MED ORDER — POTASSIUM CHLORIDE CRYS ER 20 MEQ PO TBCR: 20.0000 meq | EXTENDED_RELEASE_TABLET | Freq: Three times a day (TID) | ORAL | 1 refills | Status: DC

## 2016-03-17 ENCOUNTER — Other Ambulatory Visit: Payer: Self-pay | Admitting: *Deleted

## 2016-03-17 MED ORDER — FUROSEMIDE 20 MG PO TABS: 20.0000 mg | ORAL_TABLET | Freq: Every day | ORAL | 1 refills | Status: DC

## 2016-03-19 ENCOUNTER — Other Ambulatory Visit: Payer: Self-pay | Admitting: *Deleted

## 2016-03-19 MED ORDER — FUROSEMIDE 20 MG PO TABS: 20.0000 mg | ORAL_TABLET | Freq: Every day | ORAL | 1 refills | Status: DC

## 2016-03-26 DIAGNOSIS — K648 Other hemorrhoids: Secondary | ICD-10-CM | POA: Diagnosis not present

## 2016-03-26 DIAGNOSIS — R151 Fecal smearing: Secondary | ICD-10-CM | POA: Diagnosis not present

## 2016-03-29 ENCOUNTER — Emergency Department (HOSPITAL_COMMUNITY)
Admission: EM | Admit: 2016-03-29 | Discharge: 2016-03-29 | Disposition: A | Discharge status: Home / Self Care | Payer: BLUE CROSS/BLUE SHIELD | Attending: Emergency Medicine | Admitting: Emergency Medicine

## 2016-03-29 ENCOUNTER — Emergency Department (HOSPITAL_COMMUNITY): Payer: BLUE CROSS/BLUE SHIELD

## 2016-03-29 ENCOUNTER — Encounter (HOSPITAL_COMMUNITY): Payer: Self-pay | Admitting: Emergency Medicine

## 2016-03-29 DIAGNOSIS — B029 Zoster without complications: Secondary | ICD-10-CM

## 2016-03-29 DIAGNOSIS — J45909 Unspecified asthma, uncomplicated: Secondary | ICD-10-CM | POA: Diagnosis not present

## 2016-03-29 DIAGNOSIS — E119 Type 2 diabetes mellitus without complications: Secondary | ICD-10-CM | POA: Insufficient documentation

## 2016-03-29 DIAGNOSIS — Z7982 Long term (current) use of aspirin: Secondary | ICD-10-CM | POA: Insufficient documentation

## 2016-03-29 DIAGNOSIS — I10 Essential (primary) hypertension: Secondary | ICD-10-CM | POA: Insufficient documentation

## 2016-03-29 DIAGNOSIS — Z7984 Long term (current) use of oral hypoglycemic drugs: Secondary | ICD-10-CM | POA: Insufficient documentation

## 2016-03-29 DIAGNOSIS — N644 Mastodynia: Secondary | ICD-10-CM | POA: Diagnosis present

## 2016-03-29 DIAGNOSIS — R222 Localized swelling, mass and lump, trunk: Secondary | ICD-10-CM | POA: Diagnosis not present

## 2016-03-29 LAB — CBC WITH DIFFERENTIAL/PLATELET
Basophils Absolute: 0 10*3/uL (ref 0.0–0.1)
Basophils Relative: 0 %
Eosinophils Absolute: 0.1 10*3/uL (ref 0.0–0.7)
Eosinophils Relative: 1 %
HCT: 40.7 % (ref 36.0–46.0)
Hemoglobin: 13.2 g/dL (ref 12.0–15.0)
Lymphocytes Relative: 33 %
Lymphs Abs: 2.7 10*3/uL (ref 0.7–4.0)
MCH: 30.6 pg (ref 26.0–34.0)
MCHC: 32.4 g/dL (ref 30.0–36.0)
MCV: 94.2 fL (ref 78.0–100.0)
Monocytes Absolute: 0.6 10*3/uL (ref 0.1–1.0)
Monocytes Relative: 7 %
Neutro Abs: 4.9 10*3/uL (ref 1.7–7.7)
Neutrophils Relative %: 59 %
Platelets: 310 10*3/uL (ref 150–400)
RBC: 4.32 MIL/uL (ref 3.87–5.11)
RDW: 13.1 % (ref 11.5–15.5)
WBC: 8.2 10*3/uL (ref 4.0–10.5)

## 2016-03-29 LAB — COMPREHENSIVE METABOLIC PANEL
ALT: 22 U/L (ref 14–54)
AST: 21 U/L (ref 15–41)
Albumin: 3.7 g/dL (ref 3.5–5.0)
Alkaline Phosphatase: 55 U/L (ref 38–126)
Anion gap: 12 (ref 5–15)
BUN: 11 mg/dL (ref 6–20)
CO2: 25 mmol/L (ref 22–32)
Calcium: 9.8 mg/dL (ref 8.9–10.3)
Chloride: 100 mmol/L — ABNORMAL LOW (ref 101–111)
Creatinine, Ser: 0.53 mg/dL (ref 0.44–1.00)
GFR calc Af Amer: >60 mL/min (ref >60)
GFR calc non Af Amer: >60 mL/min (ref >60)
Glucose, Bld: 108 mg/dL — ABNORMAL HIGH (ref 65–99)
Potassium: 4.1 mmol/L (ref 3.5–5.1)
Sodium: 137 mmol/L (ref 135–145)
Total Bilirubin: 0.5 mg/dL (ref 0.3–1.2)
Total Protein: 6.8 g/dL (ref 6.5–8.1)

## 2016-03-29 MED ORDER — VALACYCLOVIR HCL 1 G PO TABS: 1000.0000 mg | ORAL_TABLET | Freq: Three times a day (TID) | ORAL | 0 refills | Status: DC

## 2016-03-29 NOTE — Discharge Instructions (Signed)
The CT scan did not show anything worrisome. Follow up with your primary care provider regarding that.

## 2016-03-29 NOTE — ED Provider Notes (Signed)
[facility] DEPT Provider Note   CSN: [id number] Arrival date & time: 03/29/16  0358     History   Chief Complaint Chief Complaint  Patient presents with  . Breast Pain    HPI [name] is a 61 y.o. female.  She comes in with complaints of red spots on her left breast which started over the last week. This occurred following a respiratory infection with a harsh, persistent cough. At around the same time, she noted a lump on the left side of her back. The red spots are feel irritated but not painful or itchy. The spot in her back is uncomfortable to lay on. She denies any trauma. She denies any fever or chills or sweats. There's been no weight loss. His separate complaint, she has pain in both calves which bother her mainly at night. She saw her primary care physician who put her on gabapentin which does not seem to have given her any relief. She states that her calf pain is been present for "a while", and not changing. She denies any exertional pain. She rates her calf pain at 4/10.   The history is provided by the patient.    Past Medical History:  Diagnosis Date  . Allergic rhinitis   . ASTHMA   . BICUSPID AORTIC VALVE   . CEREBROVASCULAR DISEASE   . DEGENERATIVE JOINT DISEASE   . Diabetes mellitus   . DJD (degenerative joint disease)   . GERD   . HSV-1 (herpes simplex virus 1) infection   . HYPERLIPIDEMIA   . Hypertension   . Palpitation   . Vitamin D deficiency     Patient Active Problem List   Diagnosis Date Noted  . Diabetes mellitus without complication ([place]) 51/76/1607  . Esotropia of right eye 04/22/2015  . Palpitations 04/11/2015  . Abnormal electrocardiogram 04/11/2015  . Sleep disorder, shift-work 10/13/2014  . Left carotid bruit 02/14/2014  . Medication management 02/14/2014  . Essential hypertension 07/27/2013  . Hyperlipidemia 02/01/2013  . Vitamin D deficiency 02/01/2013  . BICUSPID AORTIC VALVE 07/03/2008  . Cerebrovascular disease  07/02/2008  . Asthma 07/02/2008  . GERD 07/02/2008  . Osteoarthritis 07/02/2008    Past Surgical History:  Procedure Laterality Date  . ABDOMINAL HYSTERECTOMY    . repair of trigger finger      OB History    No data available       Home Medications    Prior to Admission medications   Medication Sig Start Date End Date Taking? Authorizing Provider  albuterol (PROVENTIL HFA;VENTOLIN HFA) 108 (90 Base) MCG/ACT inhaler Use 1  To 2 inhalations 5 minutes apart every 4 hours if needed to rescue asthma 09/05/15 09/04/16  [name], MD  aspirin EC 81 MG tablet Take 81 mg by mouth daily.    Historical Provider, MD  B Complex-C (SUPER B COMPLEX PO) Take 1 tablet by mouth daily. Takes every other day    Historical Provider, MD  Blood Glucose Monitoring Suppl ([place]) w/Device KIT Check blood sugar 1 time daily-DX-E11.9. 11/06/15   [name], MD  canagliflozin (INVOKANA) 300 MG TABS tablet TAKE 1 TABLET BY MOUTH EVERY DAY FOR DIABETES 01/13/16   [name], MD  Cholecalciferol (VITAMIN D) 2000 UNITS CAPS Take 1 capsule by mouth 2 (two) times daily.    Historical Provider, MD  Cinnamon 500 MG capsule Take 500 mg by mouth daily.    Historical Provider, MD  Diclofenac Sodium 1.5 % SOLN Place a small amount of  gel on the bilateral hands for hand arthritis 02/19/16   Starlyn Skeans, PA-C  ferrous sulfate 325 (65 FE) MG tablet Take 325 mg by mouth daily with breakfast.     Historical Provider, MD  FIBER FORMULA PO Take 1 tablet by mouth daily.     Historical Provider, MD  fluconazole (DIFLUCAN) 150 MG tablet Use once for yeast infection as needed 11/06/15   [name], MD  furosemide (LASIX) 20 MG tablet Take 1 tablet (20 mg total) by mouth daily. 03/19/16 03/19/17  [name], MD  gabapentin (NEURONTIN) 100 MG capsule Take 1-3 pills at night 03/05/16   Courtney Forcucci, PA-C  glucose blood (GMATE BLOOD GLUCOSE TEST) test strip Check blood sugar 1 time daily-DX-E11.9.  11/06/15   [name], MD  GMATE LANCETS 30G MISC Check blood sugar 1 time daily-DX-E11.9 11/06/15   [name], MD  lisinopril-hydrochlorothiazide (PRINZIDE,ZESTORETIC) 10-12.5 MG tablet Take 1 tablet by mouth every morning. 01/21/16   [name], MD  MAGNESIUM PO Take 2 tablets by mouth daily.    Historical Provider, MD  meloxicam (MOBIC) 15 MG tablet Take 1 tablet (15 mg total) by mouth as needed for pain. 03/05/16   Courtney Forcucci, PA-C  metFORMIN (GLUCOPHAGE) 1000 MG tablet TAKE 1 BY MOUTH TWICE DAILY WITH A MEAL 01/21/16   [name], MD  metoprolol succinate (TOPROL-XL) 25 MG 24 hr tablet TAKE 1 BY MOUTH DAILY 07/28/15   [name], MD  mometasone (NASONEX) 50 MCG/ACT nasal spray Place 2 sprays into the nose daily. 10/13/14 10/13/15  [name], MD  Multiple Vitamin (MULITIVITAMIN WITH MINERALS) TABS Take 1 tablet by mouth daily.    Historical Provider, MD  Omega-3 Fatty Acids (FISH OIL) 1000 MG CAPS Take 1 capsule (1,000 mg total) by mouth daily. 02/01/13   [name], MD  OVER THE COUNTER MEDICATION 500 mg daily. Tumeric 500 mg daily    Historical Provider, MD  OVER THE COUNTER MEDICATION Soft stool 1 daily    Historical Provider, MD  potassium chloride SA (KLOR-CON M20) 20 MEQ tablet Take 1 tablet (20 mEq total) by mouth 3 (three) times daily. 03/10/16   Vicie Mutters, PA-C  pravastatin (PRAVACHOL) 40 MG tablet TAKE 1 BY MOUTH EVERY DAY OR AS DIRECTED. 12/18/15   [name], MD  ranitidine (ZANTAC) 300 MG tablet TAKE 1 BY MOUTH DAILY 01/21/16   [name], MD  traMADol (ULTRAM) 50 MG tablet Take 1 tablet (50 mg total) by mouth as needed. 06/03/14   Vicie Mutters, PA-C    Family History Family History  Problem Relation Age of Onset  . Coronary artery disease Mother   . Heart disease Mother   . Hypertension Mother   . Diabetes Father   . Hypertension Father     Social History Social History  Substance Use Topics  . Smoking status: Never  Smoker  . Smokeless tobacco: Former Systems developer  . Alcohol use Yes     Comment: very rarely     Allergies   Naprosyn [naproxen]   Review of Systems Review of Systems  All other systems reviewed and are negative.    Physical Exam Updated Vital Signs BP 127/70 (BP Location: Right Arm)   Pulse 87   Temp 98.1 F (36.7 C) (Oral)   Resp 12   Ht 4' 11"  (1.499 m)   Wt 130 lb (59 kg)   SpO2 100%   BMI 26.26 kg/m   Physical Exam  Nursing note and vitals reviewed.  60 year  old female, resting comfortably and in no acute distress. Vital signs are normal. Oxygen saturation is 100%, which is normal. Head is normocephalic and atraumatic. PERRLA, EOMI. Oropharynx is clear. Neck is nontender and supple without adenopathy or JVD. Back is nontender and there is no CVA tenderness. There is a 2 cm x 2 cm firm to hard mass just medial to the left scapula. It is not movable. Lungs are clear without rales, wheezes, or rhonchi. Chest is nontender. Heart has regular rate and rhythm without murmur. Abdomen is soft, flat, nontender without masses or hepatosplenomegaly and peristalsis is normoactive. Extremities have no cyanosis or edema, full range of motion is present. Dorsalis pedis pulses are strong. Capillary refill is prompt. Skin is warm and dry. There are 2 erythematous areas on her left breast with small vesicles present. Picture is consistent with herpes zoster. No other zoster-like lesions are present. Neurologic: Mental status is normal, cranial nerves are intact, there are no motor or sensory deficits.  ED Treatments / Results  Labs (all labs ordered are listed, but only abnormal results are displayed) Labs Reviewed  COMPREHENSIVE METABOLIC PANEL - Abnormal; Notable for the following:       Result Value   Chloride 100 (*)    Glucose, Bld 108 (*)    All other components within normal limits  CBC WITH DIFFERENTIAL/PLATELET    Radiology Ct Chest Wo Contrast  Result Date:  03/29/2016 CLINICAL DATA:  Mass medial to the left scapula. History of hypertension, diabetes, and asthma. EXAM: CT CHEST WITHOUT CONTRAST TECHNIQUE: Multidetector CT imaging of the chest was performed following the standard protocol without IV contrast. COMPARISON:  None. FINDINGS: Cardiovascular: Normal heart size. No pericardial effusion. Mild aortic and coronary artery calcifications. Normal caliber aorta. Mediastinum/Nodes: No enlarged mediastinal or axillary lymph nodes. Thyroid gland, trachea, and esophagus demonstrate no significant findings. Lungs/Pleura: Lungs are clear and expanded. No focal consolidation. No pleural effusions. No pneumothorax. Airways are patent. Upper Abdomen: No acute abnormality. Musculoskeletal: Mild degenerative changes in the spine. No destructive bone lesions appreciated. No definite evidence of a mass involving the left scapula. However, MRI would be more sensitive for evaluation of musculoskeletal lesions. IMPRESSION: Minimal aortic atherosclerosis. No evidence of active pulmonary disease. Degenerative changes in the spine. No specific lesion identified to involve the left scapula. Electronically Signed   By: Lucienne Capers M.D.   On: 03/29/2016 06:33    Procedures Procedures (including critical care time)  Medications Ordered in ED Medications - No data to display   Initial Impression / Assessment and Plan / ED Course  I have reviewed the triage vital signs and the nursing notes.  Pertinent labs & imaging results that were available during my care of the patient were reviewed by me and considered in my medical decision making (see chart for details).  Rash on left breast which appears to be a mild case of herpes zoster. Mass on back of uncertain cause. I'm concerned that it is not mobile and will send for CT to evaluate. Ongoing calf pain of uncertain cause. No evidence of peripheral vascular disease on exam.  CT shows no obvious lesions. She is discharged  with prescription for valacyclovir and advised to follow-up with PCP.  Final Clinical Impressions(s) / ED Diagnoses   Final diagnoses:  Herpes zoster without complication    New Prescriptions New Prescriptions   VALACYCLOVIR (VALTREX) 1000 MG TABLET    Take 1 tablet (1,000 mg total) by mouth 3 (three) times daily.  Delora Fuel, MD 35/24/81 [id number]

## 2016-03-29 NOTE — ED Triage Notes (Signed)
C/o "knot" on back and "2 sores" on her L breast x 3 days.

## 2016-03-31 ENCOUNTER — Other Ambulatory Visit: Payer: Self-pay | Admitting: *Deleted

## 2016-03-31 MED ORDER — GLUCOSE BLOOD VI STRP: ORAL_STRIP | 4 refills | Status: DC

## 2016-04-06 ENCOUNTER — Other Ambulatory Visit: Payer: Self-pay | Admitting: *Deleted

## 2016-04-06 MED ORDER — LANCETS 30G MISC: 3 refills | Status: DC

## 2016-04-06 MED ORDER — GLUCOSE BLOOD VI STRP: ORAL_STRIP | 4 refills | Status: DC

## 2016-04-08 ENCOUNTER — Other Ambulatory Visit: Payer: Self-pay | Admitting: *Deleted

## 2016-04-08 MED ORDER — BAYER MICROLET LANCETS MISC: 4 refills | Status: DC

## 2016-04-14 ENCOUNTER — Other Ambulatory Visit: Payer: Self-pay | Admitting: *Deleted

## 2016-04-20 ENCOUNTER — Ambulatory Visit (INDEPENDENT_AMBULATORY_CARE_PROVIDER_SITE_OTHER): Payer: BLUE CROSS/BLUE SHIELD | Admitting: Internal Medicine

## 2016-04-20 ENCOUNTER — Encounter: Payer: Self-pay | Admitting: Internal Medicine

## 2016-04-20 VITALS — BP 122/60 | HR 80 | Temp 98.4°F | Resp 16 | Ht <= 58 in | Wt 128.0 lb

## 2016-04-20 DIAGNOSIS — L239 Allergic contact dermatitis, unspecified cause: Secondary | ICD-10-CM

## 2016-04-20 MED ORDER — PREDNISONE 20 MG PO TABS: ORAL_TABLET | ORAL | 0 refills | Status: DC

## 2016-04-20 MED ORDER — TRIAMCINOLONE ACETONIDE 0.1 % EX CREA: 1.0000 "application " | TOPICAL_CREAM | Freq: Four times a day (QID) | CUTANEOUS | 0 refills | Status: DC | PRN

## 2016-04-20 NOTE — Progress Notes (Signed)
Assessment and Plan:   1. Allergic contact dermatitis, unspecified trigger -prednisone -kenalog cream -eucrsia samples given for patient to use on face -if no improvement patient to let office know. -not shingles as it is bilateral.  HPI 61 y.o.female presents for evaluation of post ER follow-up.  She was seen on 03/29/16 in the ER and she was told that she had shingles on her left breast that she had zoster like lesions.  She reports that she was given valacyclovir and she finished the course of the medication.  The rash oh her breast is gone.  She reports that she is seeing some healing there.  Now she is feeling like she is having some burning and itching on both sides of her neck.  She reports that she has had some bumps back there.  She reports that she has not had changed any soaps, lotions, shampoos, conditioners.  She reports that she usuallywears wigs but hasn't gotten any new ones recently.   Past Medical History:  Diagnosis Date  . Allergic rhinitis   . ASTHMA   . BICUSPID AORTIC VALVE   . CEREBROVASCULAR DISEASE   . DEGENERATIVE JOINT DISEASE   . Diabetes mellitus   . DJD (degenerative joint disease)   . GERD   . HSV-1 (herpes simplex virus 1) infection   . HYPERLIPIDEMIA   . Hypertension   . Palpitation   . Vitamin D deficiency      Allergies  Allergen Reactions  . Naprosyn [Naproxen] Other (See Comments)    GI upset      Current Outpatient Prescriptions on File Prior to Visit  Medication Sig Dispense Refill  . albuterol (PROVENTIL HFA;VENTOLIN HFA) 108 (90 Base) MCG/ACT inhaler Use 1  To 2 inhalations 5 minutes apart every 4 hours if needed to rescue asthma 48 g 99  . aspirin EC 81 MG tablet Take 81 mg by mouth daily.    . B Complex-C (SUPER B COMPLEX PO) Take 1 tablet by mouth daily. Takes every other day    . BAYER MICROLET LANCETS lancets Check blood sugar 1 time daily-DX-E11.9. 100 each 4  . canagliflozin (INVOKANA) 300 MG TABS tablet TAKE 1 TABLET BY MOUTH  EVERY DAY FOR DIABETES 90 tablet 2  . Cholecalciferol (VITAMIN D) 2000 UNITS CAPS Take 1 capsule by mouth 2 (two) times daily.    . Cinnamon 500 MG capsule Take 500 mg by mouth daily.    . Diclofenac Sodium 1.5 % SOLN Place a small amount of gel on the bilateral hands for hand arthritis 150 mL 0  . ferrous sulfate 325 (65 FE) MG tablet Take 325 mg by mouth daily with breakfast.     . FIBER FORMULA PO Take 1 tablet by mouth daily.     . fluconazole (DIFLUCAN) 150 MG tablet Use once for yeast infection as needed 2 tablet 3  . furosemide (LASIX) 20 MG tablet Take 1 tablet (20 mg total) by mouth daily. 90 tablet 1  . gabapentin (NEURONTIN) 100 MG capsule Take 1-3 pills at night 90 capsule 0  . glucose blood (BAYER CONTOUR NEXT TEST) test strip Check blood sugar 1 time daily-DX-E11.9. 100 each 4  . lisinopril-hydrochlorothiazide (PRINZIDE,ZESTORETIC) 10-12.5 MG tablet Take 1 tablet by mouth every morning. 90 tablet 1  . MAGNESIUM PO Take 2 tablets by mouth daily.    . meloxicam (MOBIC) 15 MG tablet Take 1 tablet (15 mg total) by mouth as needed for pain. 90 tablet 1  . metFORMIN (GLUCOPHAGE) 1000  MG tablet TAKE 1 BY MOUTH TWICE DAILY WITH A MEAL 180 tablet 1  . metoprolol succinate (TOPROL-XL) 25 MG 24 hr tablet TAKE 1 BY MOUTH DAILY 90 tablet 1  . Multiple Vitamin (MULITIVITAMIN WITH MINERALS) TABS Take 1 tablet by mouth daily.    . Omega-3 Fatty Acids (FISH OIL) 1000 MG CAPS Take 1 capsule (1,000 mg total) by mouth daily.  0  . OVER THE COUNTER MEDICATION 500 mg daily. Tumeric 500 mg daily    . OVER THE COUNTER MEDICATION Soft stool 1 daily    . potassium chloride SA (KLOR-CON M20) 20 MEQ tablet Take 1 tablet (20 mEq total) by mouth 3 (three) times daily. 270 tablet 1  . pravastatin (PRAVACHOL) 40 MG tablet TAKE 1 BY MOUTH EVERY DAY OR AS DIRECTED. 90 tablet 3  . ranitidine (ZANTAC) 300 MG tablet TAKE 1 BY MOUTH DAILY 90 tablet 1  . traMADol (ULTRAM) 50 MG tablet Take 1 tablet (50 mg total) by  mouth as needed. 90 tablet 0  . valACYclovir (VALTREX) 1000 MG tablet Take 1 tablet (1,000 mg total) by mouth 3 (three) times daily. 21 tablet 0  . mometasone (NASONEX) 50 MCG/ACT nasal spray Place 2 sprays into the nose daily. 51 g 99   No current facility-administered medications on file prior to visit.     Review of Systems  Constitutional: Negative for chills, fever and malaise/fatigue.  Gastrointestinal: Negative for nausea and vomiting.  Neurological: Positive for tingling. Negative for tremors.     Physical Exam: Filed Weights   04/20/16 0857  Weight: 128 lb (58.1 kg)   BP 122/60   Pulse 80   Temp 98.4 F (36.9 C) (Temporal)   Resp 16   Ht 4\' 10"  (1.473 m)   Wt 128 lb (58.1 kg)   BMI 26.75 kg/m  General Appearance: Well developed well nourished, non-toxic appearing in no apparent distress. Eyes: PERRLA, EOMs, conjunctiva w/ no swelling or erythema or discharge Sinuses: No Frontal/maxillary tenderness ENT/Mouth: Ear canals clear without swelling or erythema.  TM's normal bilaterally with no retractions, bulging, or loss of landmarks.   Neck: Supple, thyroid normal, no notable JVD.  Skin with 1 cm x 2 cm erythematous raised oval lesion to the bilatearl neck just inferior to the hair line with excoration.  NO scaling, petecia, or purpura.   Respiratory: Respiratory effort normal, Clear breath sounds anteriorly and posteriorly bilaterally without rales, rhonchi, wheezing or stridor. No retractions or accessory muscle usage. Cardio: RRR with no MRGs.   Abdomen: Soft, + BS.  Non tender, no guarding, rebound, hernias, masses.  Musculoskeletal: Full ROM, 5/5 strength, normal gait.  Skin: Warm, dry without rashes  Neuro: Awake and oriented X 3, Cranial nerves intact. Normal muscle tone, no cerebellar symptoms. Sensation intact.  Psych: normal affect, Insight and Judgment appropriate.     Starlyn Skeans, PA-C 9:48 AM [facility]

## 2016-04-20 NOTE — Patient Instructions (Signed)
Please take prednisone with breakfast daily until it is gone.  Please use the triamcinolone cream on the spots on the back of your neck 2-3 times daily.   For you face you can use either eucrisa or hydrocortisone cream 10 twice daily.    Continue to use systane drops 2-3 times per day.  You can try using flonase 2 sprays per nostril.

## 2016-04-28 ENCOUNTER — Other Ambulatory Visit: Payer: Self-pay | Admitting: *Deleted

## 2016-04-28 DIAGNOSIS — K6289 Other specified diseases of anus and rectum: Secondary | ICD-10-CM | POA: Diagnosis not present

## 2016-04-28 DIAGNOSIS — R143 Flatulence: Secondary | ICD-10-CM | POA: Diagnosis not present

## 2016-04-28 MED ORDER — METOPROLOL SUCCINATE ER 25 MG PO TB24: ORAL_TABLET | ORAL | 1 refills | Status: DC

## 2016-05-21 ENCOUNTER — Encounter: Payer: Self-pay | Admitting: Internal Medicine

## 2016-05-21 ENCOUNTER — Ambulatory Visit (INDEPENDENT_AMBULATORY_CARE_PROVIDER_SITE_OTHER): Payer: BLUE CROSS/BLUE SHIELD | Admitting: Internal Medicine

## 2016-05-21 VITALS — BP 120/68 | HR 88 | Temp 97.8°F | Resp 16 | Ht <= 58 in | Wt 129.0 lb

## 2016-05-21 DIAGNOSIS — E119 Type 2 diabetes mellitus without complications: Secondary | ICD-10-CM

## 2016-05-21 DIAGNOSIS — E559 Vitamin D deficiency, unspecified: Secondary | ICD-10-CM

## 2016-05-21 DIAGNOSIS — Z79899 Other long term (current) drug therapy: Secondary | ICD-10-CM

## 2016-05-21 DIAGNOSIS — K219 Gastro-esophageal reflux disease without esophagitis: Secondary | ICD-10-CM | POA: Diagnosis not present

## 2016-05-21 DIAGNOSIS — E782 Mixed hyperlipidemia: Secondary | ICD-10-CM | POA: Diagnosis not present

## 2016-05-21 DIAGNOSIS — G2581 Restless legs syndrome: Secondary | ICD-10-CM | POA: Diagnosis not present

## 2016-05-21 DIAGNOSIS — I10 Essential (primary) hypertension: Secondary | ICD-10-CM

## 2016-05-21 LAB — BASIC METABOLIC PANEL WITH GFR
BUN: 16 mg/dL (ref 7–25)
CO2: 27 mmol/L (ref 20–31)
Calcium: 9.3 mg/dL (ref 8.6–10.4)
Chloride: 103 mmol/L (ref 98–110)
Creat: 0.56 mg/dL (ref 0.50–0.99)
GFR, Est African American: >89 mL/min (ref >=60)
GFR, Est Non African American: >89 mL/min (ref >=60)
Glucose, Bld: 104 mg/dL — ABNORMAL HIGH (ref 65–99)
Potassium: 4.3 mmol/L (ref 3.5–5.3)
Sodium: 140 mmol/L (ref 135–146)

## 2016-05-21 LAB — CBC WITH DIFFERENTIAL/PLATELET
Basophils Absolute: 0 cells/uL (ref 0–200)
Basophils Relative: 0 %
Eosinophils Absolute: 201 cells/uL (ref 15–500)
Eosinophils Relative: 3 %
HCT: 43.4 % (ref 35.0–45.0)
Hemoglobin: 14.2 g/dL (ref 11.7–15.5)
Lymphocytes Relative: 30 %
Lymphs Abs: 2010 cells/uL (ref 850–3900)
MCH: 31.6 pg (ref 27.0–33.0)
MCHC: 32.7 g/dL (ref 32.0–36.0)
MCV: 96.7 fL (ref 80.0–100.0)
MPV: 11 fL (ref 7.5–12.5)
Monocytes Absolute: 670 cells/uL (ref 200–950)
Monocytes Relative: 10 %
Neutro Abs: 3819 cells/uL (ref 1500–7800)
Neutrophils Relative %: 57 %
Platelets: 280 10*3/uL (ref 140–400)
RBC: 4.49 MIL/uL (ref 3.80–5.10)
RDW: 14.2 % (ref 11.0–15.0)
WBC: 6.7 10*3/uL (ref 3.8–10.8)

## 2016-05-21 LAB — LIPID PANEL
Cholesterol: 140 mg/dL (ref <200)
HDL: 55 mg/dL (ref >50)
LDL Cholesterol: 73 mg/dL (ref <100)
Total CHOL/HDL Ratio: 2.5 Ratio (ref <5.0)
Triglycerides: 60 mg/dL (ref <150)
VLDL: 12 mg/dL (ref <30)

## 2016-05-21 LAB — HEPATIC FUNCTION PANEL
ALT: 17 U/L (ref 6–29)
AST: 17 U/L (ref 10–35)
Albumin: 4.2 g/dL (ref 3.6–5.1)
Alkaline Phosphatase: 68 U/L (ref 33–130)
Bilirubin, Direct: 0 mg/dL (ref <=0.2)
Indirect Bilirubin: 0.2 mg/dL (ref 0.2–1.2)
Total Bilirubin: 0.2 mg/dL (ref 0.2–1.2)
Total Protein: 7.1 g/dL (ref 6.1–8.1)

## 2016-05-21 LAB — TSH: TSH: 1.1 mIU/L

## 2016-05-21 LAB — MAGNESIUM: Magnesium: 2 mg/dL (ref 1.5–2.5)

## 2016-05-21 MED ORDER — ROPINIROLE HCL 1 MG PO TABS: ORAL_TABLET | ORAL | 1 refills | Status: DC

## 2016-05-21 MED ORDER — GABAPENTIN 300 MG PO CAPS: ORAL_CAPSULE | ORAL | 1 refills | Status: DC

## 2016-05-21 NOTE — Progress Notes (Signed)
This very nice 61 y.o. WBF presents for 6 month follow up with Hypertension, Hyperlipidemia, T2_NIDDM and Vitamin D Deficiency.      Patient is treated for HTN (1996) & BP has been controlled at home. Today's BP is at goal - 120/68. Patient has had no complaints of any cardiac type chest pain, palpitations, dyspnea/orthopnea/PND, dizziness, claudication, or dependent edema.     Hyperlipidemia is controlled with diet & meds. Patient denies myalgias or other med SE's. Last Lipids were  Lab Results  Component Value Date   CHOL 147 02/19/2016   HDL 48 (L) 02/19/2016   LDLCALC 83 02/19/2016   TRIG 81 02/19/2016   CHOLHDL 3.1 02/19/2016      Also, the patient has history of T2_NIDDM (1996) and has had no symptoms of reactive hypoglycemia, diabetic polys, paresthesias or visual blurring.  Last A1c was  Lab Results  Component Value Date   HGBA1C 6.7 (H) 02/19/2016      Further, the patient also has history of Vitamin D Deficiency ("30" in 2008) and supplements vitamin D without any suspected side-effects. Last vitamin D was   Lab Results  Component Value Date   VD25OH 38 11/06/2015   Current Outpatient Prescriptions on File Prior to Visit  Medication Sig  . albuterol  HFA inhaler 1  To 2 inhalations every 4 hrs if needed   . aspirin EC 81 MG  Take 81 mg by mouth daily.  . SUPER B COMPLEX Take 1 tablet by mouth daily  . INVOKANA 300 MG  TAKE 1 TAB EVERY DAY FOR DIABETES  . VITAMIN D 2000 UNITS  Take 1 cap 2 x daily.  . Cinnamon 500 MG capsule Take h daily.  . Diclofenac 1.5 % SOLN Place a small amt of gel on the  hands for arthritis  . ferrous sulfate 325 MG Take daily with breakfast.   . furosemide  20 MG  Take 1 tab daily.  [name] lisinopril-hctz 10-12.5  Take 1 tab every morning.  [name] MAGNESIUM PO Take 2 tabdaily.  . meloxicam  15 MG  Take 1 tab as needed for pain.  . metFORMIN 1000 MG TAKE 1  TWICE DAILY WITH A MEAL  . metoprolol succ-XL 25 MG  TAKE 1 DAILY  . Multi-Vit w/Min Take 1  tab daily.  . Omega-3 FISH OIL 1000 MG  Take 1 cap daily.  . Tumeric 500 mg Takes  daily  . potassium chloride 20 MEQ  Take 1 tab 3 x daily.  . pravastatin  40 MG  TAKE 1  EVERY DAY OR AS DIRECTED.  [name] ranitidine  300 MG TAKE 1  DAILY  . traMADol 50 MG  Take 1 tab as needed.  [name]  KENALOG crm 0.1 % Apply 1 application topically 4 x daily as needed.  [name] NASONEX T nasal spray 2 sprays into the nose daily.   Allergies  Allergen Reactions  . Naprosyn [Naproxen] Other (See Comments)    GI upset   PMHx:   Past Medical History:  Diagnosis Date  . Allergic rhinitis   . ASTHMA   . BICUSPID AORTIC VALVE   . CEREBROVASCULAR DISEASE   . DEGENERATIVE JOINT DISEASE   . Diabetes mellitus   . DJD (degenerative joint disease)   . GERD   . HSV-1 (herpes simplex virus 1) infection   . HYPERLIPIDEMIA   . Hypertension   . Palpitation   . Vitamin D deficiency    Immunization History  Administered Date(s) Administered  . DT 10/11/2014  . DTaP 03/18/2004  . Influenza Split 11/15/2013, 11/30/2014  . Influenza Whole 11/02/2012  . Influenza-Unspecified 10/31/2015  . PPD Test 07/30/2013, 10/11/2014, 11/06/2015  . Pneumococcal Conjugate-13 11/15/2013  . Pneumococcal Polysaccharide-23 05/16/1996   Past Surgical History:  Procedure Laterality Date  . ABDOMINAL HYSTERECTOMY    . repair of trigger finger     FHx:    Reviewed / unchanged  SHx:    Reviewed / unchanged  Systems Review:  Constitutional: Denies fever, chills, wt changes, headaches, insomnia, fatigue, night sweats, change in appetite. Eyes: Denies redness, blurred vision, diplopia, discharge, itchy, watery eyes.  ENT: Denies discharge, congestion, post nasal drip, epistaxis, sore throat, earache, hearing loss, dental pain, tinnitus, vertigo, sinus pain, snoring.  CV: Denies chest pain, palpitations, irregular heartbeat, syncope, dyspnea, diaphoresis, orthopnea, PND, claudication or edema. Respiratory: denies cough, dyspnea, DOE,  pleurisy, hoarseness, laryngitis, wheezing.  Gastrointestinal: Denies dysphagia, odynophagia, heartburn, reflux, water brash, abdominal pain or cramps, nausea, vomiting, bloating, diarrhea, constipation, hematemesis, melena, hematochezia  or hemorrhoids. Genitourinary: Denies dysuria, frequency, urgency, nocturia, hesitancy, discharge, hematuria or flank pain. Musculoskeletal: Denies arthralgias, myalgias, stiffness, jt. swelling, pain, limping or strain/sprain.  Skin: Denies pruritus, rash, hives, warts, acne, eczema or change in skin lesion(s). Neuro: No weakness, tremor, incoordination, spasms, paresthesia or pain. Psychiatric: Denies confusion, memory loss or sensory loss. Endo: Denies change in weight, skin or hair change.  Heme/Lymph: No excessive bleeding, bruising or enlarged lymph nodes.  Physical Exam  BP 120/68   Pulse 88   Temp 97.8 F (36.6 C)   Resp 16   Ht 4\' 10"  (1.473 m)   Wt 129 lb (58.5 kg)   BMI 26.96 kg/m   Appears well nourished, well groomed  and in no distress.  Eyes: PERRLA, Right esotropia, Blat pseudophakia, conjunctiva no swelling or erythema. Sinuses: No frontal/maxillary tenderness ENT/Mouth: EAC's clear, TM's nl w/o erythema, bulging. Nares clear w/o erythema, swelling, exudates. Oropharynx clear without erythema or exudates. Oral hygiene is good. Tongue normal, non obstructing. Hearing intact.  Neck: Supple. Thyroid nl. Car 2+/2+ without bruits, nodes or JVD. Chest: Respirations nl with BS clear & equal w/o rales, rhonchi, wheezing or stridor.  Cor: Heart sounds normal w/ regular rate and rhythm without sig. murmurs, gallops, clicks or rubs. Peripheral pulses normal and equal  without edema.  Abdomen: Soft & bowel sounds normal. Non-tender w/o guarding, rebound, hernias, masses or organomegaly.  Lymphatics: Unremarkable.  Musculoskeletal: Full ROM all peripheral extremities, joint stability, 5/5 strength and normal gait.  Skin: Warm, dry without  exposed rashes, lesions or ecchymosis apparent.  Neuro: Cranial nerves intact, reflexes equal bilaterally. Sensory-motor testing grossly intact. Tendon reflexes grossly intact.  Pysch: Alert & oriented x 3.  Insight and judgement nl & appropriate. No ideations.  Assessment and Plan:  1. Essential hypertension  - Continue medication, monitor blood pressure at home.  - Continue DASH diet. Reminder to go to the ER if any CP,  SOB, nausea, dizziness, severe HA, changes vision/speech,  left arm numbness and tingling and jaw pain.  - CBC with Differential/Platelet - BASIC METABOLIC PANEL WITH GFR - Magnesium - TSH  2. Mixed hyperlipidemia  - Continue diet/meds, exercise,& lifestyle modifications.  - Continue monitor periodic cholesterol/liver & renal functions   - Hepatic function panel - Lipid panel - TSH  3. Diabetes mellitus without complication (HCC)  - Hemoglobin A1c - Insulin, random  4. Vitamin D deficiency  - Continue diet, exercise, lifestyle modifications.  -  Monitor appropriate labs. - Continue supplementation. - VITAMIN D 25 Hydroxy   5. Gastroesophageal reflux disease   6. Medication management  - CBC with Differential/Platelet - BASIC METABOLIC PANEL WITH GFR - Hepatic function panel - Magnesium - Lipid panel - TSH - Hemoglobin A1c - Insulin, random - VITAMIN D 25 Hydroxy   7. RLS (restless legs syndrome)  - gabapentin (NEURONTIN) 300 MG capsule; Take 1 capsule 3 x/ day for leg pains  Dispense: 270 capsule; Refill: 1 - rOPINIRole (REQUIP) 1 MG tablet; Take 1/2 to 1 tablet 3 x/ day for restless legs  Dispense: 270 tablet; Refill: 1       Discussed  regular exercise, BP monitoring, weight control to achieve/maintain BMI less than 25 and discussed med and SE's. Recommended labs to assess and monitor clinical status with further disposition pending results of labs. Over 30 minutes of exam, counseling, chart review was performed.

## 2016-05-21 NOTE — Patient Instructions (Addendum)
Restless Legs Syndrome Restless legs syndrome is a condition that causes uncomfortable feelings or sensations in the legs, especially while sitting or lying down. The sensations usually cause an overwhelming urge to move the legs. The arms can also sometimes be affected. The condition can range from mild to severe. The symptoms often interfere with a person's ability to sleep. What are the causes? The cause of this condition is not known. What increases the risk? This condition is more likely to develop in:  People who are older than age 74.  Pregnant women. In general, restless legs syndrome is more common in women than in men.  People who have a family history of the condition.  People who have certain medical conditions, such as iron deficiency, kidney disease, Parkinson disease, or nerve damage.  People who take certain medicines, such as medicines for high blood pressure, nausea, colds, allergies, depression, and some heart conditions. What are the signs or symptoms? The main symptom of this condition is uncomfortable sensations in the legs. These sensations may be:  Described as pulling, tingling, prickling, throbbing, crawling, or burning.  Worse while you are sitting or lying down.  Worse during periods of rest or inactivity.  Worse at night, often interfering with your sleep.  Accompanied by a very strong urge to move your legs.  Temporarily relieved by movement of your legs. The sensations usually affect both sides of the body. The arms can also be affected, but this is rare. People who have this condition often have tiredness during the day because of their lack of sleep at night. How is this diagnosed? This condition may be diagnosed based on your description of the symptoms. You may also have tests, including blood tests, to check for other conditions that may lead to your symptoms. In some cases, you may be asked to spend some time in a sleep lab so your sleeping can be  monitored. How is this treated? Treatment for this condition is focused on managing the symptoms. Treatment may include:  Self-help and lifestyle changes.  Medicines. Follow these instructions at home:  Take medicines only as directed by your health care provider.  Try these methods to get temporary relief from the uncomfortable sensations:  Massage your legs.  Walk or stretch.  Take a cold or hot bath.  Practice good sleep habits. For example, go to bed and get up at the same time every day.  Exercise regularly.  Practice ways of relaxing, such as yoga or meditation.  Avoid caffeine and alcohol.  Do not use any tobacco products, including cigarettes, chewing tobacco, or electronic cigarettes. If you need help quitting, ask your health care provider.  Keep all follow-up visits as directed by your health care provider. This is important.  +++++++++++++++++++++++++ Recommend Adult Low Dose Aspirin or  coated  Aspirin 81 mg daily  To reduce risk of Colon Cancer 20 %,  Skin Cancer 26 % ,  Melanoma 46%  and  Pancreatic cancer 60% +++++++++++++++++++++++++ Vitamin D goal  is between 70-100.  Please make sure that you are taking your Vitamin D as directed.  It is very important as a natural anti-inflammatory  helping hair, skin, and nails, as well as reducing stroke and heart attack risk.  It helps your bones and helps with mood. It also decreases numerous cancer risks so please take it as directed.  Low Vit D is associated with a 200-300% higher risk for CANCER  and 200-300% higher risk for HEART   ATTACK  &  STROKE.   .....................................[name] It is also associated with higher death rate at younger ages,  autoimmune diseases like Rheumatoid arthritis, Lupus, Multiple Sclerosis.    Also many other serious conditions, like depression, Alzheimer's Dementia, infertility, muscle aches, fatigue, fibromyalgia - just to name a few. ++++++++++++++++++++ Recommend  the book "The END of DIETING" by Dr Excell Seltzer  & the book "The END of DIABETES " by Dr Excell Seltzer At [email or web address] - get book & Audio CD's    Being diabetic has a  300% increased risk for heart attack, stroke, cancer, and alzheimer- type vascular dementia. It is very important that you work harder with diet by avoiding all foods that are white. Avoid white rice (brown & wild rice is OK), white potatoes (sweetpotatoes in moderation is OK), White bread or wheat bread or anything made out of white flour like bagels, donuts, rolls, buns, biscuits, cakes, pastries, cookies, pizza crust, and pasta (made from white flour & egg whites) - vegetarian pasta or spinach or wheat pasta is OK. Multigrain breads like Arnold's or Pepperidge [employer], or multigrain sandwich thins or flatbreads.  Diet, exercise and weight loss can reverse and cure diabetes in the early stages.  Diet, exercise and weight loss is very important in the control and prevention of complications of diabetes which affects every system in your body, ie. Brain - dementia/stroke, eyes - glaucoma/blindness, heart - heart attack/heart failure, kidneys - dialysis, stomach - gastric paralysis, intestines - malabsorption, nerves - severe painful neuritis, circulation - gangrene & loss of a leg(s), and finally cancer and Alzheimers.    I recommend avoid fried & greasy foods,  sweets/candy, white rice (brown or wild rice or Quinoa is OK), white potatoes (sweet potatoes are OK) - anything made from white flour - bagels, doughnuts, rolls, buns, biscuits,white and wheat breads, pizza crust and traditional pasta made of white flour & egg white(vegetarian pasta or spinach or wheat pasta is OK).  Multi-grain bread is OK - like multi-grain flat bread or sandwich thins. Avoid alcohol in excess. Exercise is also important.    Eat all the vegetables you want - avoid meat, especially red meat and dairy - especially cheese.  Cheese is the most concentrated form of trans-fats  which is the worst thing to clog up our arteries. Veggie cheese is OK which can be found in the fresh produce section at [employer] or [employer] or [employer]  +++++++++++++++++++++ DASH Eating Plan  DASH stands for "Dietary Approaches to Stop Hypertension."   The DASH eating plan is a healthy eating plan that has been shown to reduce high blood pressure (hypertension). Additional health benefits may include reducing the risk of type 2 diabetes mellitus, heart disease, and stroke. The DASH eating plan may also help with weight loss. WHAT DO I NEED TO KNOW ABOUT THE DASH EATING PLAN? For the DASH eating plan, you will follow these general guidelines:  Choose foods with a percent daily value for sodium of less than 5% (as listed on the food label).  Use salt-free seasonings or herbs instead of table salt or sea salt.  Check with your health care provider or pharmacist before using salt substitutes.  Eat lower-sodium products, often labeled as "lower sodium" or "no salt added."  Eat fresh foods.  Eat more vegetables, fruits, and low-fat dairy products.  Choose whole grains. Look for the word "whole" as the first word in the ingredient list.  Choose fish   Limit sweets, desserts, sugars, and sugary drinks.  Choose heart-healthy fats.  Eat veggie cheese   Eat more home-cooked food and less restaurant, buffet, and fast food.  Limit fried foods.  Cook foods using methods other than frying.  Limit canned vegetables. If you do use them, rinse them well to decrease the sodium.  When eating at a restaurant, ask that your food be prepared with less salt, or no salt if possible.                      WHAT FOODS CAN I EAT? Read Dr Fara Olden Fuhrman's books on The End of Dieting & The End of Diabetes  Grains Whole grain or whole wheat bread. Brown rice. Whole grain or whole wheat pasta. Quinoa, bulgur, and whole grain cereals. Low-sodium cereals. Corn or whole wheat flour tortillas.  Whole grain cornbread. Whole grain crackers. Low-sodium crackers.  Vegetables Fresh or frozen vegetables (raw, steamed, roasted, or grilled). Low-sodium or reduced-sodium tomato and vegetable juices. Low-sodium or reduced-sodium tomato sauce and paste. Low-sodium or reduced-sodium canned vegetables.   Fruits All fresh, canned (in natural juice), or frozen fruits.  Protein Products  All fish and seafood.  Dried beans, peas, or lentils. Unsalted nuts and seeds. Unsalted canned beans.  Dairy Low-fat dairy products, such as skim or 1% milk, 2% or reduced-fat cheeses, low-fat ricotta or cottage cheese, or plain low-fat yogurt. Low-sodium or reduced-sodium cheeses.  Fats and Oils Tub margarines without trans fats. Light or reduced-fat mayonnaise and salad dressings (reduced sodium). Avocado. Safflower, olive, or canola oils. Natural peanut or almond butter.  Other Unsalted popcorn and pretzels. The items listed above may not be a complete list of recommended foods or beverages. Contact your dietitian for more options.  +++++++++++++++  WHAT FOODS ARE NOT RECOMMENDED? Grains/ White flour or wheat flour White bread. White pasta. White rice. Refined cornbread. Bagels and croissants. Crackers that contain trans fat.  Vegetables  Creamed or fried vegetables. Vegetables in a . Regular canned vegetables. Regular canned tomato sauce and paste. Regular tomato and vegetable juices.  Fruits Dried fruits. Canned fruit in light or heavy syrup. Fruit juice.  Meat and Other Protein Products Meat in general - RED meat & White meat.  Fatty cuts of meat. Ribs, chicken wings, all processed meats as bacon, sausage, bologna, salami, fatback, hot dogs, bratwurst and packaged luncheon meats.  Dairy Whole or 2% milk, cream, half-and-half, and cream cheese. Whole-fat or sweetened yogurt. Full-fat cheeses or blue cheese. Non-dairy creamers and whipped toppings. Processed cheese, cheese spreads, or cheese  curds.  Condiments Onion and garlic salt, seasoned salt, table salt, and sea salt. Canned and packaged gravies. Worcestershire sauce. Tartar sauce. Barbecue sauce. Teriyaki sauce. Soy sauce, including reduced sodium. Steak sauce. Fish sauce. Oyster sauce. Cocktail sauce. Horseradish. Ketchup and mustard. Meat flavorings and tenderizers. Bouillon cubes. Hot sauce. Tabasco sauce. Marinades. Taco seasonings. Relishes.  Fats and Oils Butter, stick margarine, lard, shortening and bacon fat. Coconut, palm kernel, or palm oils. Regular salad dressings.  Pickles and olives. Salted popcorn and pretzels.  The items listed above may not be a complete list of foods and beverages to avoid.

## 2016-05-22 ENCOUNTER — Encounter: Payer: Self-pay | Admitting: Internal Medicine

## 2016-05-22 LAB — HEMOGLOBIN A1C
Hgb A1c MFr Bld: 6.9 % — ABNORMAL HIGH (ref <5.7)
Mean Plasma Glucose: 151 mg/dL

## 2016-05-22 LAB — VITAMIN D 25 HYDROXY (VIT D DEFICIENCY, FRACTURES): Vit D, 25-Hydroxy: 56 ng/mL (ref 30–100)

## 2016-05-24 LAB — INSULIN, RANDOM: Insulin: 14.1 u[IU]/mL (ref 2.0–19.6)

## 2016-07-02 DIAGNOSIS — E119 Type 2 diabetes mellitus without complications: Secondary | ICD-10-CM | POA: Diagnosis not present

## 2016-07-02 LAB — HM DIABETES EYE EXAM

## 2016-07-29 ENCOUNTER — Encounter: Payer: Self-pay | Admitting: Internal Medicine

## 2016-08-10 ENCOUNTER — Other Ambulatory Visit: Payer: Self-pay | Admitting: Internal Medicine

## 2016-09-01 NOTE — Progress Notes (Signed)
Assessment and Plan:   Cerebrovascular disease Control blood pressure, cholesterol, glucose, increase exercise.  Will switch invokana to Jardiance  BICUSPID AORTIC VALVE Control blood pressure, cholesterol, glucose, increase exercise.  No symptoms at this time.   Essential hypertension - continue medications, DASH diet, exercise and monitor at home. Call if greater than 130/80.  -     CBC with Differential/Platelet -     BASIC METABOLIC PANEL WITH GFR -     Hepatic function panel -     TSH  Diabetes mellitus without complication ([place]) Discussed general issues about diabetes pathophysiology and management., Educational material distributed., Suggested low cholesterol diet., Encouraged aerobic exercise., Discussed foot care., Reminded to get yearly retinal exam. Will switch invokana to jardiance -     Hemoglobin A1c  Hyperlipidemia -continue medications, check lipids, decrease fatty foods, increase activity.  -     Lipid panel  Medication management -     Magnesium  RLS (restless legs syndrome) Check labs, add fusion +, increase requip to 2 at night, exercise -     Iron and TIBC -     Vitamin B12  Anemia, unspecified type -     Iron and TIBC -     Vitamin B12  Continue diet and meds as discussed. Further disposition pending results of labs.  HPI 61 y.o. AA female  presents for 3 month follow up with hypertension, hyperlipidemia, prediabetes and vitamin D.  She has history of RLS, states she can not lay on her right side, sitting in a car for a long time causes leg pain, the requip helps. Lab Results  Component Value Date   IRON 53 11/06/2015   TIBC 323 11/06/2015   FERRITIN 48 07/24/2015    Her blood pressure has been controlled at home, today their BP is BP: 124/76.   She does workout. She denies chest pain, shortness of breath, dizziness.   She is on cholesterol medication and denies myalgias. Her cholesterol is at goal. The cholesterol last visit was:   Lab  Results  Component Value Date   CHOL 140 05/21/2016   HDL 55 05/21/2016   LDLCALC 73 05/21/2016   TRIG 60 05/21/2016   CHOLHDL 2.5 05/21/2016    She has been working on diet and exercise for diabetes, and denies foot ulcerations, hyperglycemia, hypoglycemia , increased appetite, nausea, paresthesia of the feet, polydipsia, polyuria, visual disturbances, vomiting and weight loss. Last A1C in the office was:  Lab Results  Component Value Date   HGBA1C 6.9 (H) 05/21/2016  She is doing well with blood sugars.  She is generally running around 120.    Patient is on Vitamin D supplement.  Lab Results  Component Value Date   VD25OH 56 05/21/2016     BMI is Body mass index is 25.96 kg/m., she is working on diet and exercise. Wt Readings from Last 3 Encounters:  09/03/16 124 lb 3.2 oz (56.3 kg)  05/21/16 129 lb (58.5 kg)  04/20/16 128 lb (58.1 kg)    Current Medications:  Current Outpatient Prescriptions on File Prior to Visit  Medication Sig Dispense Refill  . albuterol (PROVENTIL HFA;VENTOLIN HFA) 108 (90 Base) MCG/ACT inhaler Use 1  To 2 inhalations 5 minutes apart every 4 hours if needed to rescue asthma 48 g 99  . aspirin EC 81 MG tablet Take 81 mg by mouth daily.    . B Complex-C (SUPER B COMPLEX PO) Take 1 tablet by mouth daily. Takes every other day    .  BAYER MICROLET LANCETS lancets Check blood sugar 1 time daily-DX-E11.9. 100 each 4  . canagliflozin (INVOKANA) 300 MG TABS tablet TAKE 1 TABLET BY MOUTH EVERY DAY FOR DIABETES 90 tablet 2  . Cholecalciferol (VITAMIN D) 2000 UNITS CAPS Take 1 capsule by mouth 2 (two) times daily.    . Cinnamon 500 MG capsule Take 500 mg by mouth daily.    . Diclofenac Sodium 1.5 % SOLN Place a small amount of gel on the bilateral hands for hand arthritis 150 mL 0  . ferrous sulfate 325 (65 FE) MG tablet Take 325 mg by mouth daily with breakfast.     . furosemide (LASIX) 20 MG tablet Take 1 tablet (20 mg total) by mouth daily. 90 tablet 1  .  gabapentin (NEURONTIN) 300 MG capsule Take 1 capsule 3 x/ day for leg pains 270 capsule 1  . glucose blood (BAYER CONTOUR NEXT TEST) test strip Check blood sugar 1 time daily-DX-E11.9. 100 each 4  . lisinopril-hydrochlorothiazide (PRINZIDE,ZESTORETIC) 10-12.5 MG tablet Take 1 tablet by mouth every morning. 90 tablet 1  . MAGNESIUM PO Take 2 tablets by mouth daily.    . meloxicam (MOBIC) 15 MG tablet Take 1 tablet (15 mg total) by mouth as needed for pain. 90 tablet 1  . metFORMIN (GLUCOPHAGE) 1000 MG tablet TAKE 1 BY MOUTH TWICE DAILY WITH A MEAL 180 tablet 1  . metoprolol succinate (TOPROL-XL) 25 MG 24 hr tablet TAKE 1 BY MOUTH DAILY 90 tablet 1  . Multiple Vitamin (MULITIVITAMIN WITH MINERALS) TABS Take 1 tablet by mouth daily.    . Omega-3 Fatty Acids (FISH OIL) 1000 MG CAPS Take 1 capsule (1,000 mg total) by mouth daily.  0  . OVER THE COUNTER MEDICATION 500 mg daily. Tumeric 500 mg daily    . potassium chloride SA (KLOR-CON M20) 20 MEQ tablet Take 1 tablet (20 mEq total) by mouth 3 (three) times daily. 270 tablet 1  . pravastatin (PRAVACHOL) 40 MG tablet TAKE 1 BY MOUTH EVERY DAY OR AS DIRECTED. 90 tablet 3  . Psyllium (METAMUCIL PO) Take 1 Dose by mouth daily.    . ranitidine (ZANTAC) 300 MG tablet TAKE 1 TABLET BY MOUTH DAILY 90 tablet 0  . rOPINIRole (REQUIP) 1 MG tablet Take 1/2 to 1 tablet 3 x/ day for restless legs 270 tablet 1  . traMADol (ULTRAM) 50 MG tablet Take 1 tablet (50 mg total) by mouth as needed. 90 tablet 0  . triamcinolone cream (KENALOG) 0.1 % Apply 1 application topically 4 (four) times daily as needed. 30 g 0  . mometasone (NASONEX) 50 MCG/ACT nasal spray Place 2 sprays into the nose daily. 51 g 99   No current facility-administered medications on file prior to visit.     Medical History:  Past Medical History:  Diagnosis Date  . Allergic rhinitis   . ASTHMA   . BICUSPID AORTIC VALVE   . CEREBROVASCULAR DISEASE   . DEGENERATIVE JOINT DISEASE   . Diabetes  mellitus   . DJD (degenerative joint disease)   . GERD   . HSV-1 (herpes simplex virus 1) infection   . HYPERLIPIDEMIA   . Hypertension   . Palpitation   . Vitamin D deficiency     Allergies:  Allergies  Allergen Reactions  . Naprosyn [Naproxen] Other (See Comments)    GI upset     Review of Systems:  Review of Systems  Constitutional: Negative for chills, fever and malaise/fatigue.  HENT: Negative for congestion, ear pain  and sore throat.   Eyes: Negative.   Respiratory: Negative for cough, shortness of breath and wheezing.   Cardiovascular: Negative for chest pain, palpitations and leg swelling.  Gastrointestinal: Negative for abdominal pain, blood in stool, constipation, diarrhea, heartburn and melena.  Genitourinary: Negative.   Musculoskeletal: Positive for myalgias (leg pain). Negative for back pain, falls and joint pain.  Skin: Negative.   Neurological: Negative for dizziness, sensory change, loss of consciousness and headaches.  Psychiatric/Behavioral: Negative for depression. The patient is not nervous/anxious and does not have insomnia.     Family history- Review and unchanged  Social history- Review and unchanged  Physical Exam: BP 124/76   Pulse (!) 102   Temp (!) 97.3 F (36.3 C)   Resp 14   Ht 4\' 10"  (1.473 m)   Wt 124 lb 3.2 oz (56.3 kg)   SpO2 95%   BMI 25.96 kg/m  Wt Readings from Last 3 Encounters:  09/03/16 124 lb 3.2 oz (56.3 kg)  05/21/16 129 lb (58.5 kg)  04/20/16 128 lb (58.1 kg)    General Appearance: Well nourished well developed, in no apparent distress. Eyes: PERRLA, EOMs, conjunctiva no swelling or erythema ENT/Mouth: Ear canals normal without obstruction, swelling, erythma, discharge.  TMs normal bilaterally.  Oropharynx moist, clear, without exudate, or postoropharyngeal swelling. + TMJ tenderness Neck: Supple, thyroid normal,no cervical adenopathy  Respiratory: Respiratory effort normal, Breath sounds clear A&P without rhonchi,  wheeze, or rale.  No retractions, no accessory usage. Cardio: RRR with no MRGs. Brisk peripheral pulses without edema.  Abdomen: Soft, + BS,  Non tender, no guarding, rebound, hernias, masses. Musculoskeletal: Full ROM, 5/5 strength, Normal gait.   Skin: Warm, dry without rashes, lesions, ecchymosis.  Neuro: Awake and oriented X 3, Cranial nerves intact. Normal muscle tone, no cerebellar symptoms. Psych: Normal affect, Insight and Judgment appropriate.    Vicie Mutters, PA-C 9:08 AM [facility]

## 2016-09-03 ENCOUNTER — Encounter: Payer: Self-pay | Admitting: Physician Assistant

## 2016-09-03 ENCOUNTER — Other Ambulatory Visit: Payer: Self-pay

## 2016-09-03 ENCOUNTER — Ambulatory Visit (INDEPENDENT_AMBULATORY_CARE_PROVIDER_SITE_OTHER): Payer: BLUE CROSS/BLUE SHIELD | Admitting: Physician Assistant

## 2016-09-03 VITALS — BP 124/76 | HR 102 | Temp 97.3°F | Resp 14 | Ht <= 58 in | Wt 124.2 lb

## 2016-09-03 DIAGNOSIS — Z79899 Other long term (current) drug therapy: Secondary | ICD-10-CM

## 2016-09-03 DIAGNOSIS — E782 Mixed hyperlipidemia: Secondary | ICD-10-CM | POA: Diagnosis not present

## 2016-09-03 DIAGNOSIS — I679 Cerebrovascular disease, unspecified: Secondary | ICD-10-CM

## 2016-09-03 DIAGNOSIS — Q231 Congenital insufficiency of aortic valve: Secondary | ICD-10-CM

## 2016-09-03 DIAGNOSIS — D649 Anemia, unspecified: Secondary | ICD-10-CM | POA: Diagnosis not present

## 2016-09-03 DIAGNOSIS — E119 Type 2 diabetes mellitus without complications: Secondary | ICD-10-CM | POA: Diagnosis not present

## 2016-09-03 DIAGNOSIS — G2581 Restless legs syndrome: Secondary | ICD-10-CM | POA: Diagnosis not present

## 2016-09-03 DIAGNOSIS — I10 Essential (primary) hypertension: Secondary | ICD-10-CM

## 2016-09-03 LAB — LIPID PANEL
Cholesterol: 133 mg/dL (ref <200)
HDL: 42 mg/dL — ABNORMAL LOW (ref >50)
LDL Cholesterol: 71 mg/dL (ref <100)
Total CHOL/HDL Ratio: 3.2 Ratio (ref <5.0)
Triglycerides: 102 mg/dL (ref <150)
VLDL: 20 mg/dL (ref <30)

## 2016-09-03 LAB — BASIC METABOLIC PANEL WITH GFR
BUN: 17 mg/dL (ref 7–25)
CO2: 22 mmol/L (ref 20–31)
Calcium: 9.3 mg/dL (ref 8.6–10.4)
Chloride: 101 mmol/L (ref 98–110)
Creat: 0.64 mg/dL (ref 0.50–0.99)
GFR, Est African American: >89 mL/min (ref >=60)
GFR, Est Non African American: >89 mL/min (ref >=60)
Glucose, Bld: 175 mg/dL — ABNORMAL HIGH (ref 65–99)
Potassium: 3.9 mmol/L (ref 3.5–5.3)
Sodium: 138 mmol/L (ref 135–146)

## 2016-09-03 LAB — CBC WITH DIFFERENTIAL/PLATELET
Basophils Absolute: 0 cells/uL (ref 0–200)
Basophils Relative: 0 %
Eosinophils Absolute: 67 cells/uL (ref 15–500)
Eosinophils Relative: 1 %
HCT: 44.4 % (ref 35.0–45.0)
Hemoglobin: 14.2 g/dL (ref 11.7–15.5)
Lymphocytes Relative: 30 %
Lymphs Abs: 2010 cells/uL (ref 850–3900)
MCH: 31.1 pg (ref 27.0–33.0)
MCHC: 32 g/dL (ref 32.0–36.0)
MCV: 97.2 fL (ref 80.0–100.0)
MPV: 11.4 fL (ref 7.5–12.5)
Monocytes Absolute: 469 cells/uL (ref 200–950)
Monocytes Relative: 7 %
Neutro Abs: 4154 cells/uL (ref 1500–7800)
Neutrophils Relative %: 62 %
Platelets: 295 10*3/uL (ref 140–400)
RBC: 4.57 MIL/uL (ref 3.80–5.10)
RDW: 13.4 % (ref 11.0–15.0)
WBC: 6.7 10*3/uL (ref 3.8–10.8)

## 2016-09-03 LAB — HEPATIC FUNCTION PANEL
ALT: 17 U/L (ref 6–29)
AST: 17 U/L (ref 10–35)
Albumin: 4.1 g/dL (ref 3.6–5.1)
Alkaline Phosphatase: 65 U/L (ref 33–130)
Bilirubin, Direct: 0.1 mg/dL (ref <=0.2)
Indirect Bilirubin: 0.2 mg/dL (ref 0.2–1.2)
Total Bilirubin: 0.3 mg/dL (ref 0.2–1.2)
Total Protein: 6.8 g/dL (ref 6.1–8.1)

## 2016-09-03 LAB — IRON AND TIBC
%SAT: 22 % (ref 11–50)
Iron: 70 ug/dL (ref 45–160)
TIBC: 324 ug/dL (ref 250–450)
UIBC: 254 ug/dL

## 2016-09-03 MED ORDER — FLUCONAZOLE 150 MG PO TABS: ORAL_TABLET | ORAL | 3 refills | Status: DC

## 2016-09-03 NOTE — Patient Instructions (Addendum)
Increase requip to two at night Try the fusion plus  Continue zantac 150-300 mg for GERD.  Avoid alcohol, spicy foods, NSAIDS (aleve, ibuprofen) at this time. See foods below.   Food Choices for Gastroesophageal Reflux Disease When you have gastroesophageal reflux disease (GERD), the foods you eat and your eating habits are very important. Choosing the right foods can help ease the discomfort of GERD. WHAT GENERAL GUIDELINES DO I NEED TO FOLLOW?  Choose fruits, vegetables, whole grains, low-fat dairy products, and low-fat meat, fish, and poultry.  Limit fats such as oils, salad dressings, butter, nuts, and avocado.  Keep a food diary to identify foods that cause symptoms.  Avoid foods that cause reflux. These may be different for different people.  Eat frequent small meals instead of three large meals each day.  Eat your meals slowly, in a relaxed setting.  Limit fried foods.  Cook foods using methods other than frying.  Avoid drinking alcohol.  Avoid drinking large amounts of liquids with your meals.  Avoid bending over or lying down until 2-3 hours after eating. WHAT FOODS ARE NOT RECOMMENDED? The following are some foods and drinks that may worsen your symptoms: Vegetables Tomatoes. Tomato juice. Tomato and spaghetti sauce. Chili peppers. Onion and garlic. Horseradish. Fruits Oranges, grapefruit, and lemon (fruit and juice). Meats High-fat meats, fish, and poultry. This includes hot dogs, ribs, ham, sausage, salami, and bacon. Dairy Whole milk and chocolate milk. Sour cream. Cream. Butter. Ice cream. Cream cheese.  Beverages Coffee and tea, with or without caffeine. Carbonated beverages or energy drinks. Condiments Hot sauce. Barbecue sauce.  Sweets/Desserts Chocolate and cocoa. Donuts. Peppermint and spearmint. Fats and Oils High-fat foods, including [place] fries and potato chips. Other Vinegar. Strong spices, such as black pepper, white pepper, red pepper,  cayenne, curry powder, cloves, ginger, and chili powder.  You can try some of the at home treatments for Restless legs and we will check some labs on you looking for deficiencies that could contribute to it however we will have you start on :   Requip 0.25mg - start this dose about 1-2 hours BEFORE restless symptoms start.  The dose may be increased by 0.25 mg every two to three days until relief is obtained. Most patients require at least 2 mg, and doses up to 4 mg may be needed. Common adverse side effects: usually mild, transient, and limited to nausea, lightheadedness, and fatigue; these usually resolve within 10 to 14 days. Less frequent side effects include nasal stuffiness, constipation, and leg edema; these are reversible if the medication is stopped.   Restless Legs Syndrome Restless legs syndrome is a movement disorder. It may also be called a sensorimotor disorder.  CAUSES  No one knows what specifically causes restless legs syndrome, but it tends to run in families. It is also more common in people with low iron, in pregnancy, in people who need dialysis, and those with nerve damage (neuropathy).Some medications may make restless legs syndrome worse.Those medications include drugs to treat high blood pressure, some heart conditions, nausea, colds, allergies, and depression. SYMPTOMS Symptoms include uncomfortable sensations in the legs. These leg sensations are worse during periods of inactivity or rest. They are also worse while sitting or lying down. Individuals that have the disorder describe sensations in the legs that feel like:  Pulling.  Drawing.  Crawling.  Worming.  Boring.  Tingling.  Pins and needles.  Prickling.  Pain. The sensations are usually accompanied by an overwhelming urge to move the legs.  Sudden muscle jerks may also occur. Movement provides temporary relief from the discomfort. In rare cases, the arms may also be affected. Symptoms may interfere  with going to sleep (sleep onset insomnia). Restless legs syndrome may also be related to periodic limb movement disorder (PLMD). PLMD is another more common motor disorder. It also causes interrupted sleep. The symptoms from PLMD usually occur most often when you are awake. TREATMENT  Treatment for restless legs syndrome is symptomatic. This means that the symptoms are treated.   Massage and cold compresses may provide temporary relief.  Walk, stretch, or take a cold or hot bath.  Get regular exercise and a good night's sleep.  Avoid caffeine, alcohol, nicotine, and medications that can make it worse.  Do activities that provide mental stimulation like discussions, needlework, and video games. These may be helpful if you are not able to walk or stretch. Some medications are effective in relieving the symptoms. However, many of these medications have side effects. Ask your caregiver about medications that may help your symptoms. Correcting iron deficiency may improve symptoms for some patients. Document Released: 01/22/2002 Document Revised: 06/18/2013 Document Reviewed: 04/30/2010 [facility] Patient Information 2015 [place], [place]. This information is not intended to replace advice given to you by your health care provider. Make sure you discuss any questions you have with your health care provider.  What is the TMJ? The temporomandibular (tem-PUH-ro-man-DIB-yoo-ler) joint, or the TMJ, connects the upper and lower jawbones. This joint allows the jaw to open wide and move back and forth when you chew, talk, or yawn.There are also several muscles that help this joint move. There can be muscle tightness and pain in the muscle that can cause several symptoms.  What causes TMJ pain? There are many causes of TMJ pain. Repeated chewing (for example, chewing gum) and clenching your teeth can cause pain in the joint. Some TMJ pain has no obvious cause. What can I do to ease the pain? There are many  things you can do to help your pain get better. When you have pain:  Eat soft foods and stay away from chewy foods (for example, taffy) Try to use both sides of your mouth to chew Don't chew gum Massage Don't open your mouth wide (for example, during yawning or singing) Don't bite your cheeks or fingernails Lower your amount of stress and worry Applying a warm, damp washcloth to the joint may help. Over-the-counter pain medicines such as ibuprofen (one brand: Advil) or acetaminophen (one brand: Tylenol) might also help. Do not use these medicines if you are allergic to them or if your doctor told you not to use them. How can I stop the pain from coming back? When your pain is better, you can do these exercises to make your muscles stronger and to keep the pain from coming back:  Resisted mouth opening: Place your thumb or two fingers under your chin and open your mouth slowly, pushing up lightly on your chin with your thumb. Hold for three to six seconds. Close your mouth slowly. Resisted mouth closing: Place your thumbs under your chin and your two index fingers on the ridge between your mouth and the bottom of your chin. Push down lightly on your chin as you close your mouth. Tongue up: Slowly open and close your mouth while keeping the tongue touching the roof of the mouth. Side-to-side jaw movement: Place an object about one fourth of an inch thick (for example, two tongue depressors) between your front teeth. Slowly  move your jaw from side to side. Increase the thickness of the object as the exercise becomes easier Forward jaw movement: Place an object about one fourth of an inch thick between your front teeth and move the bottom jaw forward so that the bottom teeth are in front of the top teeth. Increase the thickness of the object as the exercise becomes easier. These exercises should not be painful. If it hurts to do these exercises, stop doing them and talk to your family doctor.   Look  up Jardiance  Diabetes is a very complicated disease...lets simplify it.  An easy way to look at it to understand the complications is if you think of the extra sugar floating in your blood stream as glass shards floating through your blood stream.    Diabetes affects your small vessels first: 1) The glass shards (sugar) scraps down the tiny blood vessels in your eyes and lead to diabetic retinopathy, the leading cause of blindness in the [place]. Diabetes is the leading cause of newly diagnosed adult (43 to 61 years of age) blindness in the [place].  2) The glass shards scratches down the tiny vessels of your legs leading to nerve damage called neuropathy and can lead to amputations of your feet. More than 60% of all non-traumatic amputations of lower limbs occur in people with diabetes.  3) Over time the small vessels in your brain are shredded and closed off, individually this does not cause any problems but over a long period of time many of the small vessels being blocked can lead to Vascular Dementia.   4) Your kidney's are a filter system and have a "net" that keeps certain things in the body and lets bad things out. Sugar shreds this net and leads to kidney damage and eventually failure. Decreasing the sugar that is destroying the net and certain blood pressure medications can help stop or decrease progression of kidney disease. Diabetes was the primary cause of kidney failure in 44 percent of all new cases in 2011.  5) Diabetes also destroys the small vessels in your penis that lead to erectile dysfunction. Eventually the vessels are so damaged that you may not be responsive to cialis or viagra.   Diabetes and your large vessels: Your larger vessels consist of your coronary arteries in your heart and the carotid vessels to your brain. Diabetes or even increased sugars put you at 300% increased risk of heart attack and stroke and this is why.. The sugar scrapes down your large blood  vessels and your body sees this as an internal injury and tries to repair itself. Just like you get a scab on your skin, your platelets will stick to the blood vessel wall trying to heal it. This is why we have diabetics on low dose aspirin daily, this prevents the platelets from sticking and can prevent plaque formation. In addition, your body takes cholesterol and tries to shove it into the open wound. This is why we want your LDL, or bad cholesterol, below 70.   The combination of platelets and cholesterol over 5-10 years forms plaque that can break off and cause a heart attack or stroke.   PLEASE REMEMBER:  Diabetes is preventable! Up to 49 percent of complications and morbidities among individuals with type 2 diabetes can be prevented, delayed, or effectively treated and minimized with regular visits to a health professional, appropriate monitoring and medication, and a healthy diet and lifestyle.

## 2016-09-04 LAB — VITAMIN B12: Vitamin B-12: >2000 pg/mL — ABNORMAL HIGH (ref 200–1100)

## 2016-09-04 LAB — HEMOGLOBIN A1C
Hgb A1c MFr Bld: 6.7 % — ABNORMAL HIGH (ref <5.7)
Mean Plasma Glucose: 146 mg/dL

## 2016-09-04 LAB — MAGNESIUM: Magnesium: 1.7 mg/dL (ref 1.5–2.5)

## 2016-09-04 LAB — TSH: TSH: 0.66 mIU/L

## 2016-09-05 ENCOUNTER — Other Ambulatory Visit: Payer: Self-pay | Admitting: Physician Assistant

## 2016-09-06 ENCOUNTER — Telehealth: Payer: Self-pay | Admitting: Physician Assistant

## 2016-09-06 MED ORDER — FUSION PLUS PO CAPS: ORAL_CAPSULE | ORAL | 1 refills | Status: DC

## 2016-09-06 NOTE — Progress Notes (Signed)
Pt aware of lab results & voiced understanding of those results.

## 2016-09-06 NOTE — Telephone Encounter (Signed)
LVM LETTING PT KNOW THAT THE IRON RX WAS SENT TO HER MAIL ORDER PHARMACY

## 2016-09-06 NOTE — Telephone Encounter (Signed)
-----   Message from Elenor Quinones, [place] sent at 09/06/2016 11:11 AM EDT ----- Regarding: Rx request Pt reports that the fusion iron samples you gave her work & she would like a Rx for 90 days sent to her pharmacy [facility] mail order).

## 2016-09-07 ENCOUNTER — Other Ambulatory Visit: Payer: Self-pay | Admitting: *Deleted

## 2016-09-07 MED ORDER — MOMETASONE FUROATE 50 MCG/ACT NA SUSP: 2.0000 | Freq: Every day | NASAL | 3 refills | Status: DC

## 2016-09-07 MED ORDER — MOMETASONE FUROATE 50 MCG/ACT NA SUSP: 2.0000 | Freq: Every day | NASAL | 0 refills | Status: DC

## 2016-09-16 ENCOUNTER — Other Ambulatory Visit: Payer: Self-pay

## 2016-09-16 MED ORDER — FUSION PLUS PO CAPS: ORAL_CAPSULE | ORAL | 1 refills | Status: DC

## 2016-10-02 ENCOUNTER — Other Ambulatory Visit: Payer: Self-pay | Admitting: Internal Medicine

## 2016-10-02 DIAGNOSIS — E119 Type 2 diabetes mellitus without complications: Secondary | ICD-10-CM

## 2016-10-02 MED ORDER — METFORMIN HCL ER 500 MG PO TB24: ORAL_TABLET | ORAL | 1 refills | Status: DC

## 2016-10-07 ENCOUNTER — Other Ambulatory Visit: Payer: Self-pay

## 2016-10-07 DIAGNOSIS — E119 Type 2 diabetes mellitus without complications: Secondary | ICD-10-CM

## 2016-10-07 MED ORDER — METFORMIN HCL ER 500 MG PO TB24: ORAL_TABLET | ORAL | 1 refills | Status: DC

## 2016-10-20 ENCOUNTER — Telehealth: Payer: Self-pay | Admitting: Physician Assistant

## 2016-10-20 MED ORDER — PREDNISONE 20 MG PO TABS: ORAL_TABLET | ORAL | 0 refills | Status: AC

## 2016-10-20 MED ORDER — ACYCLOVIR 400 MG PO TABS: ORAL_TABLET | ORAL | 0 refills | Status: DC

## 2016-10-20 NOTE — Telephone Encounter (Signed)
Patient has history of shingles and calling with similar symptoms. Wanting refill of medications given last time.   If not better needs OV or if worse go to ER

## 2016-10-21 NOTE — Telephone Encounter (Signed)
PT PICKED UP RX THAT WAS SENT TO HER PHARMACY.

## 2016-11-10 ENCOUNTER — Other Ambulatory Visit: Payer: Self-pay | Admitting: Internal Medicine

## 2016-11-10 ENCOUNTER — Other Ambulatory Visit: Payer: Self-pay | Admitting: Physician Assistant

## 2016-11-17 ENCOUNTER — Ambulatory Visit (INDEPENDENT_AMBULATORY_CARE_PROVIDER_SITE_OTHER): Payer: BLUE CROSS/BLUE SHIELD | Admitting: Internal Medicine

## 2016-11-17 ENCOUNTER — Encounter: Payer: Self-pay | Admitting: Internal Medicine

## 2016-11-17 ENCOUNTER — Other Ambulatory Visit: Payer: Self-pay | Admitting: *Deleted

## 2016-11-17 VITALS — BP 122/60 | HR 72 | Temp 97.0°F | Resp 18 | Ht <= 58 in | Wt 124.4 lb

## 2016-11-17 DIAGNOSIS — Z0000 Encounter for general adult medical examination without abnormal findings: Secondary | ICD-10-CM | POA: Diagnosis not present

## 2016-11-17 DIAGNOSIS — E559 Vitamin D deficiency, unspecified: Secondary | ICD-10-CM | POA: Diagnosis not present

## 2016-11-17 DIAGNOSIS — R5383 Other fatigue: Secondary | ICD-10-CM

## 2016-11-17 DIAGNOSIS — Z136 Encounter for screening for cardiovascular disorders: Secondary | ICD-10-CM | POA: Diagnosis not present

## 2016-11-17 DIAGNOSIS — Z111 Encounter for screening for respiratory tuberculosis: Secondary | ICD-10-CM

## 2016-11-17 DIAGNOSIS — I10 Essential (primary) hypertension: Secondary | ICD-10-CM | POA: Diagnosis not present

## 2016-11-17 DIAGNOSIS — Q231 Congenital insufficiency of aortic valve: Secondary | ICD-10-CM

## 2016-11-17 DIAGNOSIS — Z79899 Other long term (current) drug therapy: Secondary | ICD-10-CM | POA: Diagnosis not present

## 2016-11-17 DIAGNOSIS — N3000 Acute cystitis without hematuria: Secondary | ICD-10-CM

## 2016-11-17 DIAGNOSIS — Z0001 Encounter for general adult medical examination with abnormal findings: Secondary | ICD-10-CM

## 2016-11-17 DIAGNOSIS — E119 Type 2 diabetes mellitus without complications: Secondary | ICD-10-CM

## 2016-11-17 DIAGNOSIS — I679 Cerebrovascular disease, unspecified: Secondary | ICD-10-CM

## 2016-11-17 DIAGNOSIS — Z23 Encounter for immunization: Secondary | ICD-10-CM | POA: Diagnosis not present

## 2016-11-17 DIAGNOSIS — Z1212 Encounter for screening for malignant neoplasm of rectum: Secondary | ICD-10-CM

## 2016-11-17 DIAGNOSIS — E782 Mixed hyperlipidemia: Secondary | ICD-10-CM

## 2016-11-17 DIAGNOSIS — K219 Gastro-esophageal reflux disease without esophagitis: Secondary | ICD-10-CM

## 2016-11-17 MED ORDER — LISINOPRIL-HYDROCHLOROTHIAZIDE 10-12.5 MG PO TABS: 1.0000 | ORAL_TABLET | Freq: Every morning | ORAL | 1 refills | Status: DC

## 2016-11-17 MED ORDER — FLUCONAZOLE 150 MG PO TABS: ORAL_TABLET | ORAL | 1 refills | Status: DC

## 2016-11-17 MED ORDER — MELOXICAM 15 MG PO TABS: 15.0000 mg | ORAL_TABLET | ORAL | 1 refills | Status: DC | PRN

## 2016-11-17 NOTE — Patient Instructions (Signed)

## 2016-11-17 NOTE — Progress Notes (Signed)
[facility] [name], M.D.     Uvaldo Bristle. Silverio Lay, P.A.-C Liane [address], [place], [place] [address] Telephone [phone] Telefax [phone] Annual Screening/Preventative Visit & Comprehensive Evaluation &  Examination     This very nice 61 y.o.  Recently re- married (1 week ago) BF presents for a Screening/Preventative Visit & comprehensive evaluation and management of multiple medical co-morbidities.  Patient has been followed for HTN, T2_NIDDM, Hyperlipidemia and Vitamin D Deficiency.      HTN predates circa 1996. Patient's BP has been controlled at home and patient denies any cardiac symptoms as chest pain, palpitations, shortness of breath, dizziness or ankle swelling. Today's BP is at goal - 122/60.      Patient's hyperlipidemia is controlled with diet and medications. Patient denies myalgias or other medication SE's. Last lipids were at goal: Lab Results  Component Value Date   CHOL 133 09/03/2016   HDL 42 (L) 09/03/2016   LDLCALC 71 09/03/2016   TRIG 102 09/03/2016   CHOLHDL 3.2 09/03/2016      Patient has T2_NIDDM predating [place] and patient denies reactive hypoglycemic symptoms, visual blurring, diabetic polys, or paresthesias. Last A1c was not at goal: Lab Results  Component Value Date   HGBA1C 6.7 (H) 09/03/2016      Finally, patient has history of Vitamin D Deficiency ("30" / 2008) and last Vitamin D was near goal (70-100):  Lab Results  Component Value Date   VD25OH 56 05/21/2016   Current Outpatient Prescriptions on File Prior to Visit  Medication Sig  . acyclovir (ZOVIRAX) 400 MG tablet Take 1 pill 3 times daily with food and 2 pills at night  . aspirin EC 81 MG tablet Take 81 mg by mouth daily.  . B Complex-C (SUPER B COMPLEX PO) Take 1 tablet by mouth daily. Takes every other day  . BAYER MICROLET LANCETS lancets Check blood sugar 1 time  daily-DX-E11.9.  [name] canagliflozin (INVOKANA) 300 MG TABS tablet TAKE 1 TABLET BY MOUTH EVERY DAY FOR DIABETES  . Cholecalciferol (VITAMIN D) 2000 UNITS CAPS Take 1 capsule by mouth 2 (two) times daily.  . Cinnamon 500 MG capsule Take 500 mg by mouth daily.  . Diclofenac Sodium 1.5 % SOLN Place a small amount of gel on the bilateral hands for hand arthritis  . fluconazole (DIFLUCAN) 150 MG tablet Use once for yeast infection as needed  . furosemide (LASIX) 20 MG tablet Take 1 tablet (20 mg total) by mouth daily.  [name] gabapentin (NEURONTIN) 100 MG capsule TAKE 1-3 PILLS AT NIGHT  . glucose blood (BAYER CONTOUR NEXT TEST) test strip Check blood sugar 1 time daily-DX-E11.9.  . Iron-FA-B Cmp-C-Biot-Probiotic (FUSION PLUS) CAPS 1 pill daily  . lisinopril-hydrochlorothiazide (PRINZIDE,ZESTORETIC) 10-12.5 MG tablet Take 1 tablet by mouth every morning.  [name] MAGNESIUM PO Take 2 tablets by mouth daily.  . meloxicam (MOBIC) 15 MG tablet Take 1 tablet (15 mg total) by mouth as needed for pain.  . metFORMIN (GLUCOPHAGE-XR) 500 MG 24 hr tablet Take 2 tablets 2 x / day with meals for Diabetes  . metoprolol succinate (TOPROL-XL) 25 MG 24 hr tablet TAKE 1 TABLET BY MOUTH DAILY  . mometasone (NASONEX) 50 MCG/ACT nasal spray Place 2 sprays into the nose daily.  . Multiple Vitamin (MULITIVITAMIN WITH MINERALS) TABS Take 1 tablet by mouth daily.  . Omega-3 Fatty Acids (FISH OIL) 1000 MG CAPS Take 1 capsule (1,000 mg total) by mouth  daily.  [name] OVER THE COUNTER MEDICATION 500 mg daily. Tumeric 500 mg daily  . potassium chloride SA (KLOR-CON M20) 20 MEQ tablet Take 1 tablet (20 mEq total) by mouth 3 (three) times daily.  . pravastatin (PRAVACHOL) 40 MG tablet TAKE 1 BY MOUTH EVERY DAY OR AS DIRECTED.  [name] Psyllium (METAMUCIL PO) Take 1 Dose by mouth daily.  . ranitidine (ZANTAC) 300 MG tablet TAKE 1 TABLET BY MOUTH DAILY  . rOPINIRole (REQUIP) 1 MG tablet Take 1/2 to 1 tablet 3 x/ day for restless legs  . traMADol (ULTRAM)  50 MG tablet Take 1 tablet (50 mg total) by mouth as needed.  . triamcinolone cream (KENALOG) 0.1 % Apply 1 application topically 4 (four) times daily as needed.  [name] albuterol (PROVENTIL HFA;VENTOLIN HFA) 108 (90 Base) MCG/ACT inhaler Use 1  To 2 inhalations 5 minutes apart every 4 hours if needed to rescue asthma   No current facility-administered medications on file prior to visit.    Allergies  Allergen Reactions  . Naprosyn [Naproxen] Other (See Comments)    GI upset   Past Medical History:  Diagnosis Date  . Allergic rhinitis   . ASTHMA   . BICUSPID AORTIC VALVE   . CEREBROVASCULAR DISEASE   . DEGENERATIVE JOINT DISEASE   . Diabetes mellitus   . DJD (degenerative joint disease)   . GERD   . HSV-1 (herpes simplex virus 1) infection   . HYPERLIPIDEMIA   . Hypertension   . Palpitation   . Vitamin D deficiency    Health Maintenance  Topic Date Due  . PNEUMOCOCCAL POLYSACCHARIDE VACCINE (2) 05/16/2001  . TETANUS/TDAP  03/25/2014  . PAP SMEAR  07/27/2016  . INFLUENZA VACCINE  09/15/2016  . FOOT EXAM  11/05/2016  . HEMOGLOBIN A1C  03/06/2017  . OPHTHALMOLOGY EXAM  07/02/2017  . MAMMOGRAM  10/16/2017  . COLONOSCOPY  01/27/2025  . Hepatitis C Screening  Completed  . HIV Screening  Completed   Immunization History  Administered Date(s) Administered  . DT 10/11/2014  . DTaP 03/18/2004  . Influenza Split 11/15/2013, 11/30/2014  . Influenza Whole 11/02/2012  . Influenza-Unspecified 10/31/2015  . PPD Test 07/30/2013, 10/11/2014, 11/06/2015  . Pneumococcal Conjugate-13 11/15/2013  . Pneumococcal Polysaccharide-23 05/16/1996   Past Surgical History:  Procedure Laterality Date  . ABDOMINAL HYSTERECTOMY    . repair of trigger finger     Family History  Problem Relation Age of Onset  . Coronary artery disease Mother   . Heart disease Mother   . Hypertension Mother   . Diabetes Father   . Hypertension Father    Social History  Substance Use Topics  . Smoking status:  Never Smoker  . Smokeless tobacco: Former Systems developer  . Alcohol use Yes     Comment: very rarely    ROS Constitutional: Denies fever, chills, weight loss/gain, headaches, insomnia,  night sweats, and change in appetite. Does c/o fatigue. Eyes: Denies redness, blurred vision, diplopia, discharge, itchy, watery eyes.  ENT: Denies discharge, congestion, post nasal drip, epistaxis, sore throat, earache, hearing loss, dental pain, Tinnitus, Vertigo, Sinus pain, snoring.  Cardio: Denies chest pain, palpitations, irregular heartbeat, syncope, dyspnea, diaphoresis, orthopnea, PND, claudication, edema Respiratory: denies cough, dyspnea, DOE, pleurisy, hoarseness, laryngitis, wheezing.  Gastrointestinal: Denies dysphagia, heartburn, reflux, water brash, pain, cramps, nausea, vomiting, bloating, diarrhea, constipation, hematemesis, melena, hematochezia, jaundice, hemorrhoids Genitourinary: Denies dysuria, frequency, urgency, nocturia, hesitancy, discharge, hematuria, flank pain Breast: Breast lumps, nipple discharge, bleeding.  Musculoskeletal: Denies arthralgia, myalgia, stiffness, Jt.  Swelling, pain, limp, and strain/sprain. Denies falls. Skin: Denies puritis, rash, hives, warts, acne, eczema, changing in skin lesion Neuro: No weakness, tremor, incoordination, spasms, paresthesia, pain Psychiatric: Denies confusion, memory loss, sensory loss. Denies Depression. Endocrine: Denies change in weight, skin, hair change, nocturia, and paresthesia, diabetic polys, visual blurring, hyper / hypo glycemic episodes.  Heme/Lymph: No excessive bleeding, bruising, enlarged lymph nodes.  Physical Exam  BP 122/60   Pulse 72   Temp (!) 97 F (36.1 C)   Resp 18   Ht 4\' 10"  (1.473 m)   Wt 124 lb 6.4 oz (56.4 kg)   BMI 26.00 kg/m   General Appearance: Well nourished, well groomed and in no apparent distress.  Eyes: PERRLA, EOMs, conjunctiva no swelling or erythema, normal fundi and vessels. Sinuses: No  frontal/maxillary tenderness ENT/Mouth: EACs patent / TMs  nl. Nares clear without erythema, swelling, mucoid exudates. Oral hygiene is good. No erythema, swelling, or exudate. Tongue normal, non-obstructing. Tonsils not swollen or erythematous. Hearing normal.  Neck: Supple, thyroid normal. No bruits, nodes or JVD. Respiratory: Respiratory effort normal.  BS equal and clear bilateral without rales, rhonci, wheezing or stridor. Cardio: Heart sounds are normal with regular rate and rhythm and no murmurs, rubs or gallops. Peripheral pulses are normal and equal bilaterally without edema. No aortic or femoral bruits. Chest: symmetric with normal excursions and percussion. Breasts: Symmetric, without lumps, nipple discharge, retractions, or fibrocystic changes.  Abdomen: Flat, soft with bowel sounds active. Nontender, no guarding, rebound, hernias, masses, or organomegaly.  Lymphatics: Non tender without lymphadenopathy.  Genitourinary:  Musculoskeletal: Full ROM all peripheral extremities, joint stability, 5/5 strength, and normal gait. Skin: Warm and dry without rashes, lesions, cyanosis, clubbing or  ecchymosis.  Neuro: Cranial nerves intact, reflexes equal bilaterally. Normal muscle tone, no cerebellar symptoms. Sensation intact to touch, vibratory and Monofilament to the toes bilaterally. Pysch: Alert and oriented X 3, normal affect, Insight and Judgment appropriate.   Assessment and Plan  1. Annual Preventative Screening Examination  2. Essential hypertension  - EKG 12-Lead - [place], RETROPERITNL ABD,  LTD - Urinalysis, Routine w reflex microscopic - CBC with Differential/Platelet - BASIC METABOLIC PANEL WITH GFR - Magnesium - TSH - Microalbumin / creatinine urine ratio  3. Hyperlipidemia, mixed  - EKG 12-Lead - [place], RETROPERITNL ABD,  LTD - Hepatic function panel - Lipid panel - TSH  4. Diabetes mellitus without complication ([place])  - EKG 12-Lead - [place], RETROPERITNL ABD,  LTD -  HM DIABETES FOOT EXAM - LOW EXTREMITY NEUR EXAM DOCUM - Hemoglobin A1c - Insulin, random - Microalbumin / creatinine urine ratio  5. Vitamin D deficiency  - VITAMIN D 25 Hydroxy  6. Gastroesophageal reflux disease  - CBC with Differential/Platelet  7. BICUSPID AORTIC VALVE  - EKG 12-Lead  8. Screening for rectal cancer  - POC Hemoccult Bld/Stl   9. Screening for ischemic heart disease  - EKG 12-Lead  10. Screening for AAA (aortic abdominal aneurysm)  - [place], RETROPERITNL ABD,  LTD  11. Fatigue  - Iron,Total/Total Iron Binding Cap - Vitamin B12 - CBC with Differential/Platelet - TSH  12. Medication management  - Urinalysis, Routine w reflex microscopic - CBC with Differential/Platelet - BASIC METABOLIC PANEL WITH GFR - Hepatic function panel - Magnesium - Lipid panel - TSH - Hemoglobin A1c - Insulin, random - VITAMIN D 25 Hydroxy  - Microalbumin / creatinine urine ratio  13. Cerebrovascular disease   14. Need for immunization against influenza  - FLU VACCINE MDCK  QUAD W/Preservative  15. Screening examination for pulmonary tuberculosis - PPD      Patient was counseled in prudent diet to achieve/maintain BMI less than 25 for weight control, BP monitoring, regular exercise and medications. Discussed med's effects and SE's. Screening labs and tests as requested with regular follow-up as recommended. Over 40 minutes of exam, counseling, chart review and high complex critical decision making was performed.

## 2016-11-18 ENCOUNTER — Other Ambulatory Visit: Payer: Self-pay | Admitting: Internal Medicine

## 2016-11-18 DIAGNOSIS — Z1231 Encounter for screening mammogram for malignant neoplasm of breast: Secondary | ICD-10-CM

## 2016-11-18 DIAGNOSIS — N3000 Acute cystitis without hematuria: Secondary | ICD-10-CM

## 2016-11-18 LAB — BASIC METABOLIC PANEL WITH GFR
BUN: 19 mg/dL (ref 7–25)
CO2: 32 mmol/L (ref 20–32)
Calcium: 10.2 mg/dL (ref 8.6–10.4)
Chloride: 99 mmol/L (ref 98–110)
Creat: 0.68 mg/dL (ref 0.50–0.99)
GFR, Est African American: 109 mL/min/{1.73_m2} (ref >=60)
GFR, Est Non African American: 94 mL/min/{1.73_m2} (ref >=60)
Glucose, Bld: 125 mg/dL — ABNORMAL HIGH (ref 65–99)
Potassium: 4.1 mmol/L (ref 3.5–5.3)
Sodium: 139 mmol/L (ref 135–146)

## 2016-11-18 LAB — URINALYSIS, ROUTINE W REFLEX MICROSCOPIC
Bacteria, UA: NONE SEEN /HPF
Bilirubin Urine: NEGATIVE
Hgb urine dipstick: NEGATIVE
Hyaline Cast: NONE SEEN /LPF
Ketones, ur: NEGATIVE
Nitrite: NEGATIVE
Protein, ur: NEGATIVE
RBC / HPF: NONE SEEN /HPF (ref 0–2)
Specific Gravity, Urine: 1.028 (ref 1.001–1.03)
pH: 6.5 (ref 5.0–8.0)

## 2016-11-18 LAB — MICROALBUMIN / CREATININE URINE RATIO
Creatinine, Urine: 52 mg/dL (ref 20–275)
Microalb, Ur: <0.2 mg/dL

## 2016-11-18 LAB — HEPATIC FUNCTION PANEL
AG Ratio: 1.7 (calc) (ref 1.0–2.5)
ALT: 19 U/L (ref 6–29)
AST: 17 U/L (ref 10–35)
Albumin: 4.4 g/dL (ref 3.6–5.1)
Alkaline phosphatase (APISO): 70 U/L (ref 33–130)
Bilirubin, Direct: 0 mg/dL (ref 0.0–0.2)
Globulin: 2.6 g/dL (calc) (ref 1.9–3.7)
Indirect Bilirubin: 0.2 mg/dL (calc) (ref 0.2–1.2)
Total Bilirubin: 0.2 mg/dL (ref 0.2–1.2)
Total Protein: 7 g/dL (ref 6.1–8.1)

## 2016-11-18 LAB — CBC WITH DIFFERENTIAL/PLATELET
Basophils Absolute: 29 cells/uL (ref 0–200)
Basophils Relative: 0.4 %
Eosinophils Absolute: 108 cells/uL (ref 15–500)
Eosinophils Relative: 1.5 %
HCT: 42.9 % (ref 35.0–45.0)
Hemoglobin: 14.1 g/dL (ref 11.7–15.5)
Lymphs Abs: 2606 cells/uL (ref 850–3900)
MCH: 31.3 pg (ref 27.0–33.0)
MCHC: 32.9 g/dL (ref 32.0–36.0)
MCV: 95.1 fL (ref 80.0–100.0)
MPV: 11.4 fL (ref 7.5–12.5)
Monocytes Relative: 7.6 %
Neutro Abs: 3910 cells/uL (ref 1500–7800)
Neutrophils Relative %: 54.3 %
Platelets: 308 10*3/uL (ref 140–400)
RBC: 4.51 10*6/uL (ref 3.80–5.10)
RDW: 13 % (ref 11.0–15.0)
Total Lymphocyte: 36.2 %
WBC mixed population: 547 cells/uL (ref 200–950)
WBC: 7.2 10*3/uL (ref 3.8–10.8)

## 2016-11-18 LAB — HEMOGLOBIN A1C
Hgb A1c MFr Bld: 7.8 % of total Hgb — ABNORMAL HIGH (ref <5.7)
Mean Plasma Glucose: 177 (calc)
eAG (mmol/L): 9.8 (calc)

## 2016-11-18 LAB — LIPID PANEL
Cholesterol: 154 mg/dL (ref <200)
HDL: 54 mg/dL (ref >50)
LDL Cholesterol (Calc): 84 mg/dL (calc)
Non-HDL Cholesterol (Calc): 100 mg/dL (calc) (ref <130)
Total CHOL/HDL Ratio: 2.9 (calc) (ref <5.0)
Triglycerides: 70 mg/dL (ref <150)

## 2016-11-18 LAB — TSH: TSH: 0.99 mIU/L (ref 0.40–4.50)

## 2016-11-18 LAB — IRON, TOTAL/TOTAL IRON BINDING CAP
%SAT: 10 % (calc) — ABNORMAL LOW (ref 11–50)
Iron: 35 ug/dL — ABNORMAL LOW (ref 45–160)
TIBC: 337 mcg/dL (calc) (ref 250–450)

## 2016-11-18 LAB — VITAMIN D 25 HYDROXY (VIT D DEFICIENCY, FRACTURES): Vit D, 25-Hydroxy: 108 ng/mL — ABNORMAL HIGH (ref 30–100)

## 2016-11-18 LAB — MAGNESIUM: Magnesium: 1.8 mg/dL (ref 1.5–2.5)

## 2016-11-18 LAB — INSULIN, RANDOM: Insulin: 6.5 u[IU]/mL (ref 2.0–19.6)

## 2016-11-18 LAB — VITAMIN B12: Vitamin B-12: >2000 pg/mL — ABNORMAL HIGH (ref 200–1100)

## 2016-11-18 NOTE — Addendum Note (Signed)
Addended by: Dolores Hoose on: 11/18/2016 03:36 PM   Modules accepted: Orders

## 2016-11-19 LAB — URINE CULTURE
MICRO NUMBER:: 81104847
SPECIMEN QUALITY:: ADEQUATE

## 2016-11-23 LAB — TB SKIN TEST
Induration: 0 mm
TB Skin Test: NEGATIVE

## 2016-11-26 ENCOUNTER — Ambulatory Visit: Payer: BLUE CROSS/BLUE SHIELD

## 2016-12-01 ENCOUNTER — Other Ambulatory Visit: Payer: Self-pay | Admitting: Internal Medicine

## 2016-12-01 DIAGNOSIS — G2581 Restless legs syndrome: Secondary | ICD-10-CM

## 2016-12-06 ENCOUNTER — Other Ambulatory Visit: Payer: Self-pay | Admitting: *Deleted

## 2016-12-06 MED ORDER — POTASSIUM CHLORIDE CRYS ER 20 MEQ PO TBCR: 20.0000 meq | EXTENDED_RELEASE_TABLET | Freq: Three times a day (TID) | ORAL | 1 refills | Status: DC

## 2016-12-24 ENCOUNTER — Ambulatory Visit
Admission: RE | Admit: 2016-12-24 | Discharge: 2016-12-24 | Disposition: A | Discharge status: Home / Self Care | Payer: BLUE CROSS/BLUE SHIELD | Source: Ambulatory Visit | Attending: Internal Medicine | Admitting: Internal Medicine

## 2016-12-24 DIAGNOSIS — Z1231 Encounter for screening mammogram for malignant neoplasm of breast: Secondary | ICD-10-CM

## 2016-12-27 ENCOUNTER — Other Ambulatory Visit: Payer: Self-pay | Admitting: Internal Medicine

## 2017-01-04 DIAGNOSIS — E119 Type 2 diabetes mellitus without complications: Secondary | ICD-10-CM | POA: Diagnosis not present

## 2017-01-25 ENCOUNTER — Other Ambulatory Visit: Payer: Self-pay

## 2017-01-25 ENCOUNTER — Other Ambulatory Visit: Payer: Self-pay | Admitting: *Deleted

## 2017-01-25 MED ORDER — TRIAMCINOLONE ACETONIDE 0.1 % EX CREA: 1.0000 "application " | TOPICAL_CREAM | Freq: Four times a day (QID) | CUTANEOUS | 0 refills | Status: DC | PRN

## 2017-02-21 NOTE — Progress Notes (Signed)
FOLLOW UP  Assessment and Plan:   Hypertension Well controlled with current medications  Monitor blood pressure at home; patient to call if consistently greater than 130/80 Continue DASH diet.   Reminder to go to the ER if any CP, SOB, nausea, dizziness, severe HA, changes vision/speech, left arm numbness and tingling and jaw pain.  Cholesterol Currently at goal; continue pravastatin 40 mg  Continue low cholesterol diet and exercise.  Check lipid panel.   Diabetes without complications Continue medication: metformin, invokana Continue diet and exercise.  Perform daily foot/skin check, notify office of any concerning changes.  Check A1C  Vitamin D Def At goal at last visit; continue supplementation to maintain goal of 70-100 Defer Vit D level  Neck and shoulder pain Rest, apply heat, NSAIDS, flexeril, stretches provided ER precautions given Refer to ortho if not better at next visit   Continue diet and meds as discussed. Further disposition pending results of labs. Discussed med's effects and SE's.   Over 30 minutes of exam, counseling, chart review, and critical decision making was performed.   Future Appointments  Date Time Provider [place]  05/31/2017  9:30 AM [name], MD [facility] None  12/12/2017  9:00 AM [name], MD [facility] None    ----------------------------------------------------------------------------------------------------------------------  HPI 62 y.o. female  presents for 3 month follow up on hypertension, cholesterol, diabetes, weight and vitamin D deficiency. She is c/o L sided neck pain, upper arm pain - she reports pain has been constant/worsening over the course of 2 months. She denies radiation, endorses. She reports she has tried applying a heating pad, has tried gabapentin which did seem to help. She has not tried t  BMI is Body mass index is 25.71 kg/m., she has not been working on diet and exercise. Wt Readings  from Last 3 Encounters:  02/22/17 123 lb (55.8 kg)  11/17/16 124 lb 6.4 oz (56.4 kg)  09/03/16 124 lb 3.2 oz (56.3 kg)   Her blood pressure has been controlled at home, today their BP is BP: 122/70  She does not workout. She denies chest pain, shortness of breath, dizziness.   She is on cholesterol medication and denies myalgias. Her cholesterol is at goal. The cholesterol last visit was:   Lab Results  Component Value Date   CHOL 154 11/17/2016   HDL 54 11/17/2016   LDLCALC 71 09/03/2016   TRIG 70 11/17/2016   CHOLHDL 2.9 11/17/2016    She has not been working on diet and exercise for T2 diabetes, and denies increased appetite, nausea, paresthesia of the feet, polydipsia, polyuria, visual disturbances and vomiting. She reports she has not been checking her sugars. Last A1C in the office was:  Lab Results  Component Value Date   HGBA1C 7.8 (H) 11/17/2016   Patient is on Vitamin D supplement and above goal at the last check:    Lab Results  Component Value Date   VD25OH 108 (H) 11/17/2016        Current Medications:  Current Outpatient Medications on File Prior to Visit  Medication Sig  . acyclovir (ZOVIRAX) 400 MG tablet Take 1 pill 3 times daily with food and 2 pills at night  . aspirin EC 81 MG tablet Take 81 mg by mouth daily.  . B Complex-C (SUPER B COMPLEX PO) Take 1 tablet by mouth daily. Takes every other day  . BAYER MICROLET LANCETS lancets Check blood sugar 1 time daily-DX-E11.9.  [name] canagliflozin (INVOKANA) 300 MG TABS tablet TAKE 1 TABLET BY  MOUTH EVERY DAY FOR DIABETES  . Cholecalciferol (VITAMIN D PO) Take 1 capsule by mouth daily. Take 10,000 units by mouth once a day  . Cinnamon 500 MG capsule Take 1,000 mg by mouth daily.   . Diclofenac Sodium 1.5 % SOLN Place a small amount of gel on the bilateral hands for hand arthritis  . fluconazole (DIFLUCAN) 150 MG tablet Use once for yeast infection as needed  . furosemide (LASIX) 20 MG tablet Take 1 tablet (20 mg  total) by mouth daily.  [name] gabapentin (NEURONTIN) 100 MG capsule TAKE 1-3 PILLS AT NIGHT  . glucose blood (BAYER CONTOUR NEXT TEST) test strip Check blood sugar 1 time daily-DX-E11.9.  . lisinopril-hydrochlorothiazide (PRINZIDE,ZESTORETIC) 10-12.5 MG tablet Take 1 tablet by mouth every morning.  [name] MAGNESIUM PO Take 2 tablets by mouth daily.  . meloxicam (MOBIC) 15 MG tablet Take 1 tablet (15 mg total) by mouth as needed for pain.  . metFORMIN (GLUCOPHAGE-XR) 500 MG 24 hr tablet Take 2 tablets 2 x / day with meals for Diabetes  . metoprolol succinate (TOPROL-XL) 25 MG 24 hr tablet TAKE 1 TABLET BY MOUTH DAILY  . mometasone (NASONEX) 50 MCG/ACT nasal spray Place 2 sprays into the nose daily.  . Multiple Vitamin (MULITIVITAMIN WITH MINERALS) TABS Take 1 tablet by mouth daily.  . Omega-3 Fatty Acids (FISH OIL) 1000 MG CAPS Take 1 capsule (1,000 mg total) by mouth daily.  [name] OVER THE COUNTER MEDICATION 500 mg daily. Tumeric 500 mg daily  . potassium chloride SA (KLOR-CON M20) 20 MEQ tablet Take 1 tablet (20 mEq total) by mouth 3 (three) times daily.  . pravastatin (PRAVACHOL) 40 MG tablet Take 1/2 to 1 tablet daily or as directed  . Psyllium (METAMUCIL PO) Take 1 Dose by mouth daily.  . ranitidine (ZANTAC) 300 MG tablet TAKE 1 TABLET BY MOUTH DAILY  . rOPINIRole (REQUIP) 1 MG tablet TAKE 1/2 TO 1 TABLET BY MOUTH 3 TIMES A DAY AS NEEDED FOR RESTLESS LEGS  . traMADol (ULTRAM) 50 MG tablet Take 1 tablet (50 mg total) by mouth as needed.  . triamcinolone cream (KENALOG) 0.1 % Apply 1 application topically 4 (four) times daily as needed.  [name] albuterol (PROVENTIL HFA;VENTOLIN HFA) 108 (90 Base) MCG/ACT inhaler Use 1  To 2 inhalations 5 minutes apart every 4 hours if needed to rescue asthma  . albuterol (PROVENTIL HFA;VENTOLIN HFA) 108 (90 Base) MCG/ACT inhaler Use 1  To 2 inhalations 5 minutes apart every 4 hours if needed to rescue asthma   No current facility-administered medications on file prior to  visit.      Allergies:  Allergies  Allergen Reactions  . Naprosyn [Naproxen] Other (See Comments)    GI upset     Medical History:  Past Medical History:  Diagnosis Date  . Allergic rhinitis   . ASTHMA   . BICUSPID AORTIC VALVE   . CEREBROVASCULAR DISEASE   . DEGENERATIVE JOINT DISEASE   . Diabetes mellitus   . DJD (degenerative joint disease)   . GERD   . HSV-1 (herpes simplex virus 1) infection   . HYPERLIPIDEMIA   . Hypertension   . Palpitation   . Vitamin D deficiency    Family history- Reviewed and unchanged Social history- Reviewed and unchanged   Review of Systems:  Review of Systems  Constitutional: Negative for malaise/fatigue and weight loss.  HENT: Negative for hearing loss and tinnitus.   Eyes: Negative for blurred vision and double vision.  Respiratory: Negative for  cough, shortness of breath and wheezing.   Cardiovascular: Negative for chest pain, palpitations, orthopnea, claudication and leg swelling.  Gastrointestinal: Negative for abdominal pain, blood in stool, constipation, diarrhea, heartburn, melena, nausea and vomiting.  Genitourinary: Negative.   Musculoskeletal: Positive for myalgias and neck pain. Negative for falls and joint pain.  Skin: Negative for rash.  Neurological: Negative for dizziness, tingling, sensory change, weakness and headaches.  Endo/Heme/Allergies: Negative for polydipsia.  Psychiatric/Behavioral: Negative.   All other systems reviewed and are negative.   Physical Exam: BP 122/70   Pulse 80   Temp 97.7 F (36.5 C)   Ht 4\' 10"  (1.473 m)   Wt 123 lb (55.8 kg)   SpO2 96%   BMI 25.71 kg/m  Wt Readings from Last 3 Encounters:  02/22/17 123 lb (55.8 kg)  11/17/16 124 lb 6.4 oz (56.4 kg)  09/03/16 124 lb 3.2 oz (56.3 kg)   General Appearance: Well nourished, in no apparent distress. Eyes: PERRLA, Right eye gaze permanently deviated, conjunctiva no swelling or erythema Sinuses: No Frontal/maxillary  tenderness ENT/Mouth: Ext aud canals clear, TMs without erythema, bulging. No erythema, swelling, or exudate on post pharynx.  Tonsils not swollen or erythematous. Hearing normal.  Neck: Supple, thyroid normal.  Respiratory: Respiratory effort normal, BS equal bilaterally without rales, rhonchi, wheezing or stridor.  Cardio: RRR with no MRGs. Brisk peripheral pulses without edema.  Abdomen: Soft, + BS.  Non tender, no guarding, rebound, hernias, masses. Lymphatics: Non tender without lymphadenopathy.  Musculoskeletal: Full ROM, 5/5 strength, Normal gait Skin: Warm, dry without rashes, lesions, ecchymosis.  Neuro: Cranial nerves intact. No cerebellar symptoms.  Psych: Awake and oriented X 3, normal affect, Insight and Judgment appropriate.    Izora Ribas, NP 8:54 AM [facility]

## 2017-02-22 ENCOUNTER — Ambulatory Visit: Payer: BLUE CROSS/BLUE SHIELD | Admitting: Adult Health

## 2017-02-22 ENCOUNTER — Encounter: Payer: Self-pay | Admitting: Adult Health

## 2017-02-22 VITALS — BP 122/70 | HR 80 | Temp 97.7°F | Ht <= 58 in | Wt 123.0 lb

## 2017-02-22 DIAGNOSIS — E782 Mixed hyperlipidemia: Secondary | ICD-10-CM | POA: Diagnosis not present

## 2017-02-22 DIAGNOSIS — M542 Cervicalgia: Secondary | ICD-10-CM | POA: Diagnosis not present

## 2017-02-22 DIAGNOSIS — E559 Vitamin D deficiency, unspecified: Secondary | ICD-10-CM | POA: Diagnosis not present

## 2017-02-22 DIAGNOSIS — I10 Essential (primary) hypertension: Secondary | ICD-10-CM | POA: Diagnosis not present

## 2017-02-22 DIAGNOSIS — M25519 Pain in unspecified shoulder: Secondary | ICD-10-CM

## 2017-02-22 DIAGNOSIS — Z79899 Other long term (current) drug therapy: Secondary | ICD-10-CM | POA: Diagnosis not present

## 2017-02-22 DIAGNOSIS — E119 Type 2 diabetes mellitus without complications: Secondary | ICD-10-CM | POA: Diagnosis not present

## 2017-02-22 NOTE — Patient Instructions (Signed)
Use a dropper or use a cap to put peroxide, olive oil,mineral oil or canola oil in the effected ear- 2-3 times a week. Let it soak for 20-30 min then you can take a shower or use a baby bulb with warm water to wash out the ear wax.  Do not use Qtips    Try the exercises and other information below, meloxicam once during the day as needed (avoid taking other NSAIDS like Alleve or Ibuprofen while taking this) and then flexeril if needed at bedtime for muscle spasm. This can be taken up to every 8 hours, but causes sedation, so should not drive or operate heavy machinery while taking this medicine.   Go to the ER if you have any new weakness in your arms, trouble with your grip, worse headache ever, fever, chills. or have worsening pain.   If you are not better in 1-3 month we will refer you to ortho   Cervical Sprain A cervical sprain is a stretch or tear in one or more of the tough, cord-like tissues that connect bones (ligaments) in the neck. Cervical sprains can range from mild to severe. Severe cervical sprains can cause the spinal bones (vertebrae) in the neck to be unstable. This can lead to spinal cord damage and can result in serious nervous system problems. The amount of time that it takes for a cervical sprain to get better depends on the cause and extent of the injury. Most cervical sprains heal in 4-6 weeks. What are the causes? Cervical sprains may be caused by an injury (trauma), such as from a motor vehicle accident, a fall, or sudden forward and backward whipping movement of the head and neck (whiplash injury). Mild cervical sprains may be caused by wear and tear over time, such as from poor posture, sitting in a chair that does not provide support, or looking up or down for long periods of time. What increases the risk? The following factors may make you more likely to develop this condition:  Participating in activities that have a high risk of trauma to the neck. These include  contact sports, auto racing, gymnastics, and diving.  Taking risks when driving or riding in a motor vehicle, such as speeding.  Having osteoarthritis of the spine.  Having poor strength and flexibility of the neck.  A previous neck injury.  Having poor posture.  Spending a lot of time in certain positions that put stress on the neck, such as sitting at a computer for long periods of time.  What are the signs or symptoms? Symptoms of this condition include:  Pain, soreness, stiffness, tenderness, swelling, or a burning sensation in the front, back, or sides of the neck.  Sudden tightening of neck muscles that you cannot control (muscle spasms).  Pain in the shoulders or upper back.  Limited ability to move the neck.  Headache.  Dizziness.  Nausea.  Vomiting.  Weakness, numbness, or tingling in a hand or an arm.  Symptoms may develop right away after injury, or they may develop over a few days. In some cases, symptoms may go away with treatment and return (recur) over time. How is this diagnosed? This condition may be diagnosed based on:  Your medical history.  Your symptoms.  Any recent injuries or known neck problems that you have, such as arthritis in the neck.  A physical exam.  Imaging tests, such as: ? X-rays. ? MRI. ? CT scan.  How is this treated? This condition is treated  by resting and icing the injured area and doing physical therapy exercises. Depending on the severity of your condition, treatment may also include:  Keeping your neck in place (immobilized) for periods of time. This may be done using: ? A cervical collar. This supports your chin and the back of your head. ? A cervical traction device. This is a sling that holds up your head. This removes weight and pressure from your neck, and it may help to relieve pain.  Medicines that help to relieve pain and inflammation.  Medicines that help to relax your muscles (muscle  relaxants).  Surgery. This is rare.  Follow these instructions at home: If you have a cervical collar:  Wear it as told by your health care provider. Do not remove the collar unless instructed by your health care provider.  Ask your health care provider before you make any adjustments to your collar.  If you have long hair, keep it outside of the collar.  Ask your health care provider if you can remove the collar for cleaning and bathing. If you are allowed to remove the collar for cleaning or bathing: ? Follow instructions from your health care provider about how to remove the collar safely. ? Clean the collar by wiping it with mild soap and water and drying it completely. ? If your collar has removable pads, remove them every 1-2 days and wash them by hand with soap and water. Let them air-dry completely before you put them back in the collar. ? Check your skin under the collar for irritation or sores. If you see any, tell your health care provider. Managing pain, stiffness, and swelling  If directed, use a cervical traction device as told by your health care provider.  If directed, apply heat to the affected area before you do your physical therapy or as often as told by your health care provider. Use the heat source that your health care provider recommends, such as a moist heat pack or a heating pad. ? Place a towel between your skin and the heat source. ? Leave the heat on for 20-30 minutes. ? Remove the heat if your skin turns bright red. This is especially important if you are unable to feel pain, heat, or cold. You may have a greater risk of getting burned.  If directed, put ice on the affected area: ? Put ice in a plastic bag. ? Place a towel between your skin and the bag. ? Leave the ice on for 20 minutes, 2-3 times a day. Activity  Do not drive while wearing a cervical collar. If you do not have a cervical collar, ask your health care provider if it is safe to drive while  your neck heals.  Do not drive or use heavy machinery while taking prescription pain medicine or muscle relaxants, unless your health care provider approves.  Do not lift anything that is heavier than 10 lb (4.5 kg) until your health care provider tells you that it is safe.  Rest as directed by your health care provider. Avoid positions and activities that make your symptoms worse. Ask your health care provider what activities are safe for you.  If physical therapy was prescribed, do exercises as told by your health care provider or physical therapist. General instructions  Take over-the-counter and prescription medicines only as told by your health care provider.  Do not use any products that contain nicotine or tobacco, such as cigarettes and e-cigarettes. These can delay healing. If you need  help quitting, ask your health care provider.  Keep all follow-up visits as told by your health care provider or physical therapist. This is important. How is this prevented? To prevent a cervical sprain from happening again:  Use and maintain good posture. Make any needed adjustments to your workstation to help you use good posture.  Exercise regularly as directed by your health care provider or physical therapist.  Avoid risky activities that may cause a cervical sprain.  Contact a health care provider if:  You have symptoms that get worse or do not get better after 2 weeks of treatment.  You have pain that gets worse or does not get better with medicine.  You develop new, unexplained symptoms.  You have sores or irritated skin on your neck from wearing your cervical collar. Get help right away if:  You have severe pain.  You develop numbness, tingling, or weakness in any part of your body.  You cannot move a part of your body (you have paralysis).  You have neck pain along with: ? Severe dizziness. ? Headache. Summary  A cervical sprain is a stretch or tear in one or more of  the tough, cord-like tissues that connect bones (ligaments) in the neck.  Cervical sprains may be caused by an injury (trauma), such as from a motor vehicle accident, a fall, or sudden forward and backward whipping movement of the head and neck (whiplash injury).  Symptoms may develop right away after injury, or they may develop over a few days.  This condition is treated by resting and icing the injured area and doing physical therapy exercises. This information is not intended to replace advice given to you by your health care provider. Make sure you discuss any questions you have with your health care provider. Document Released: 11/29/2006 Document Revised: 10/01/2015 Document Reviewed: 10/01/2015 Elsevier Interactive Patient Education  2017 [employer]   Cervical Strain and Sprain Rehab Ask your health care provider which exercises are safe for you. Do exercises exactly as told by your health care provider and adjust them as directed. It is normal to feel mild stretching, pulling, tightness, or discomfort as you do these exercises, but you should stop right away if you feel sudden pain or your pain gets worse.Do not begin these exercises until told by your health care provider. Stretching and range of motion exercises These exercises warm up your muscles and joints and improve the movement and flexibility of your neck. These exercises also help to relieve pain, numbness, and tingling. Exercise A: Cervical side bend  1. Using good posture, sit on a stable chair or stand up. 2. Without moving your shoulders, slowly tilt your left / right ear to your shoulder until you feel a stretch in your neck muscles. You should be looking straight ahead. 3. Hold for __________ seconds. 4. Repeat with the other side of your neck. Repeat __________ times. Complete this exercise __________ times a day. Exercise B: Cervical rotation  1. Using good posture, sit on a stable chair or stand up. 2. Slowly  turn your head to the side as if you are looking over your left / right shoulder. ? Keep your eyes level with the ground. ? Stop when you feel a stretch along the side and the back of your neck. 3. Hold for __________ seconds. 4. Repeat this by turning to your other side. Repeat __________ times. Complete this exercise __________ times a day. Exercise C: Thoracic extension and pectoral stretch 1. Roll a  towel or a small blanket so it is about 4 inches (10 cm) in diameter. 2. Lie down on your back on a firm surface. 3. Put the towel lengthwise, under your spine in the middle of your back. It should not be not under your shoulder blades. The towel should line up with your spine from your middle back to your lower back. 4. Put your hands behind your head and let your elbows fall out to your sides. 5. Hold for __________ seconds. Repeat __________ times. Complete this exercise __________ times a day. Strengthening exercises These exercises build strength and endurance in your neck. Endurance is the ability to use your muscles for a long time, even after your muscles get tired. Exercise D: Upper cervical flexion, isometric 1. Lie on your back with a thin pillow behind your head and a small rolled-up towel under your neck. 2. Gently tuck your chin toward your chest and nod your head down to look toward your feet. Do not lift your head off the pillow. 3. Hold for __________ seconds. 4. Release the tension slowly. Relax your neck muscles completely before you repeat this exercise. Repeat __________ times. Complete this exercise __________ times a day. Exercise E: Cervical extension, isometric  1. Stand about 6 inches (15 cm) away from a wall, with your back facing the wall. 2. Place a soft object, about 6-8 inches (15-20 cm) in diameter, between the back of your head and the wall. A soft object could be a small pillow, a ball, or a folded towel. 3. Gently tilt your head back and press into the soft  object. Keep your jaw and forehead relaxed. 4. Hold for __________ seconds. 5. Release the tension slowly. Relax your neck muscles completely before you repeat this exercise. Repeat __________ times. Complete this exercise __________ times a day. Posture and body mechanics  Body mechanics refers to the movements and positions of your body while you do your daily activities. Posture is part of body mechanics. Good posture and healthy body mechanics can help to relieve stress in your body's tissues and joints. Good posture means that your spine is in its natural S-curve position (your spine is neutral), your shoulders are pulled back slightly, and your head is not tipped forward. The following are general guidelines for applying improved posture and body mechanics to your everyday activities. Standing  When standing, keep your spine neutral and keep your feet about hip-width apart. Keep a slight bend in your knees. Your ears, shoulders, and hips should line up.  When you do a task in which you stand in one place for a long time, place one foot up on a stable object that is 2-4 inches (5-10 cm) high, such as a footstool. This helps keep your spine neutral. Sitting   When sitting, keep your spine neutral and your keep feet flat on the floor. Use a footrest, if necessary, and keep your thighs parallel to the floor. Avoid rounding your shoulders, and avoid tilting your head forward.  When working at a desk or a computer, keep your desk at a height where your hands are slightly lower than your elbows. Slide your chair under your desk so you are close enough to maintain good posture.  When working at a computer, place your monitor at a height where you are looking straight ahead and you do not have to tilt your head forward or downward to look at the screen. Resting When lying down and resting, avoid positions that are most  painful for you. Try to support your neck in a neutral position. You can use a  contour pillow or a small rolled-up towel. Your pillow should support your neck but not push on it. This information is not intended to replace advice given to you by your health care provider. Make sure you discuss any questions you have with your health care provider. Document Released: 02/01/2005 Document Revised: 10/09/2015 Document Reviewed: 01/08/2015 Elsevier Interactive Patient Education  [employer].

## 2017-02-23 LAB — CBC WITH DIFFERENTIAL/PLATELET
Basophils Absolute: 51 cells/uL (ref 0–200)
Basophils Relative: 0.6 %
Eosinophils Absolute: 94 cells/uL (ref 15–500)
Eosinophils Relative: 1.1 %
HCT: 43.9 % (ref 35.0–45.0)
Hemoglobin: 14.5 g/dL (ref 11.7–15.5)
Lymphs Abs: 2610 cells/uL (ref 850–3900)
MCH: 31 pg (ref 27.0–33.0)
MCHC: 33 g/dL (ref 32.0–36.0)
MCV: 94 fL (ref 80.0–100.0)
MPV: 11.3 fL (ref 7.5–12.5)
Monocytes Relative: 7.2 %
Neutro Abs: 5134 cells/uL (ref 1500–7800)
Neutrophils Relative %: 60.4 %
Platelets: 278 10*3/uL (ref 140–400)
RBC: 4.67 10*6/uL (ref 3.80–5.10)
RDW: 12.2 % (ref 11.0–15.0)
Total Lymphocyte: 30.7 %
WBC mixed population: 612 cells/uL (ref 200–950)
WBC: 8.5 10*3/uL (ref 3.8–10.8)

## 2017-02-23 LAB — LIPID PANEL
Cholesterol: 129 mg/dL (ref <200)
HDL: 49 mg/dL — ABNORMAL LOW (ref >50)
LDL Cholesterol (Calc): 65 mg/dL (calc)
Non-HDL Cholesterol (Calc): 80 mg/dL (calc) (ref <130)
Total CHOL/HDL Ratio: 2.6 (calc) (ref <5.0)
Triglycerides: 73 mg/dL (ref <150)

## 2017-02-23 LAB — BASIC METABOLIC PANEL WITH GFR
BUN: 17 mg/dL (ref 7–25)
CO2: 30 mmol/L (ref 20–32)
Calcium: 10.2 mg/dL (ref 8.6–10.4)
Chloride: 101 mmol/L (ref 98–110)
Creat: 0.53 mg/dL (ref 0.50–0.99)
GFR, Est African American: 119 mL/min/{1.73_m2} (ref >=60)
GFR, Est Non African American: 102 mL/min/{1.73_m2} (ref >=60)
Glucose, Bld: 81 mg/dL (ref 65–99)
Potassium: 4.6 mmol/L (ref 3.5–5.3)
Sodium: 141 mmol/L (ref 135–146)

## 2017-02-23 LAB — HEPATIC FUNCTION PANEL
AG Ratio: 1.6 (calc) (ref 1.0–2.5)
ALT: 14 U/L (ref 6–29)
AST: 15 U/L (ref 10–35)
Albumin: 4.5 g/dL (ref 3.6–5.1)
Alkaline phosphatase (APISO): 65 U/L (ref 33–130)
Bilirubin, Direct: 0.1 mg/dL (ref 0.0–0.2)
Globulin: 2.9 g/dL (calc) (ref 1.9–3.7)
Indirect Bilirubin: 0.1 mg/dL (calc) — ABNORMAL LOW (ref 0.2–1.2)
Total Bilirubin: 0.2 mg/dL (ref 0.2–1.2)
Total Protein: 7.4 g/dL (ref 6.1–8.1)

## 2017-02-23 LAB — HEMOGLOBIN A1C
Hgb A1c MFr Bld: 7.1 % of total Hgb — ABNORMAL HIGH (ref <5.7)
Mean Plasma Glucose: 157 (calc)
eAG (mmol/L): 8.7 (calc)

## 2017-02-23 LAB — VITAMIN D 25 HYDROXY (VIT D DEFICIENCY, FRACTURES): Vit D, 25-Hydroxy: 119 ng/mL — ABNORMAL HIGH (ref 30–100)

## 2017-02-23 LAB — TSH: TSH: 2.89 mIU/L (ref 0.40–4.50)

## 2017-02-27 IMAGING — MG MM SCREEN MAMMOGRAM BILATERAL
4 series · 4 of 4 positions shown · non-contrast
Comparison: Previous exam(s).

CLINICAL DATA: Screening.

EXAM:
DIGITAL SCREENING BILATERAL MAMMOGRAM WITH CAD

[R CC]
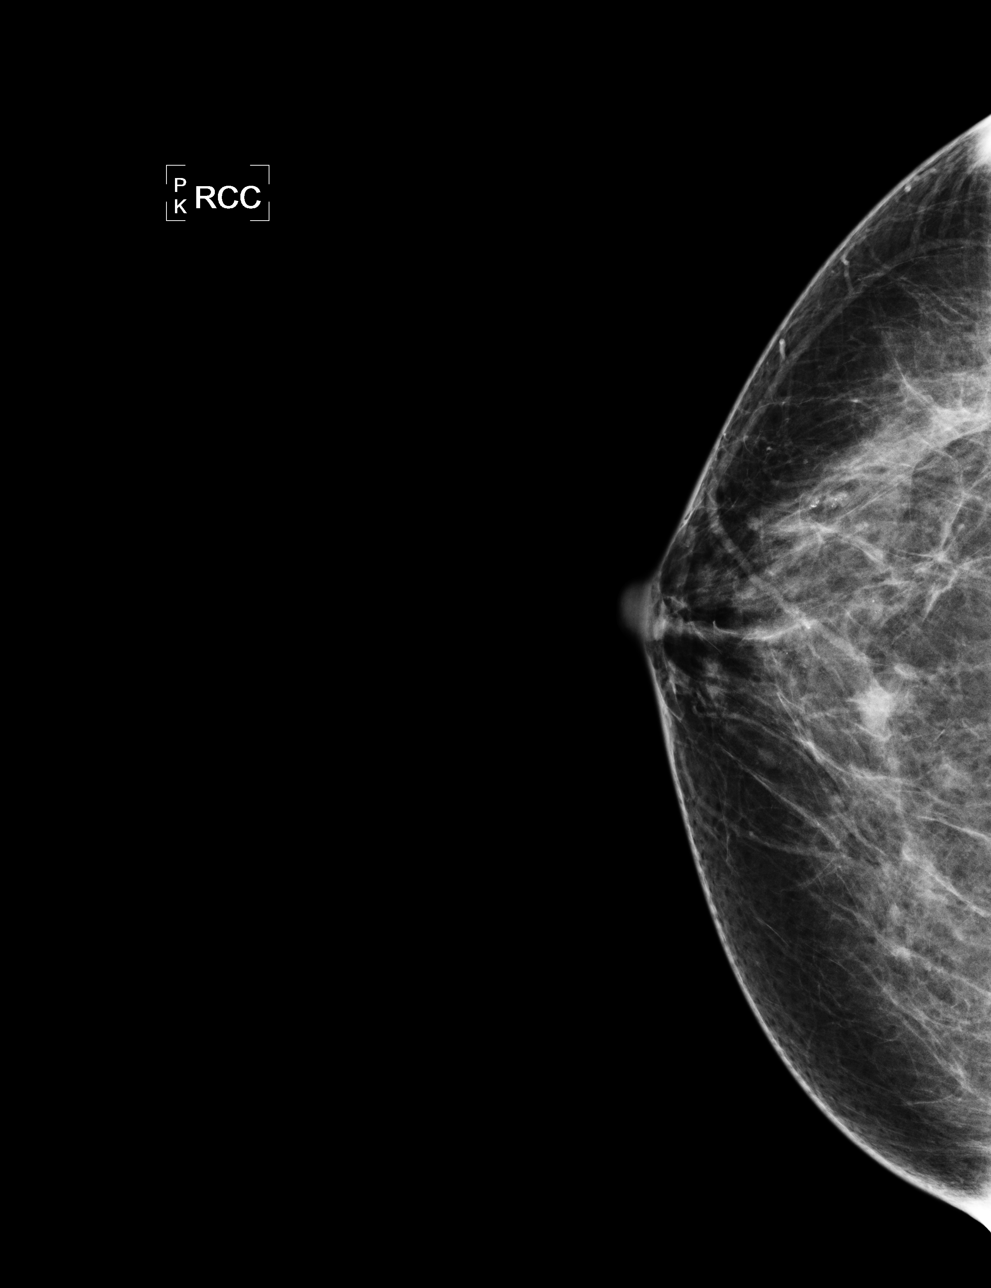

[L CC]
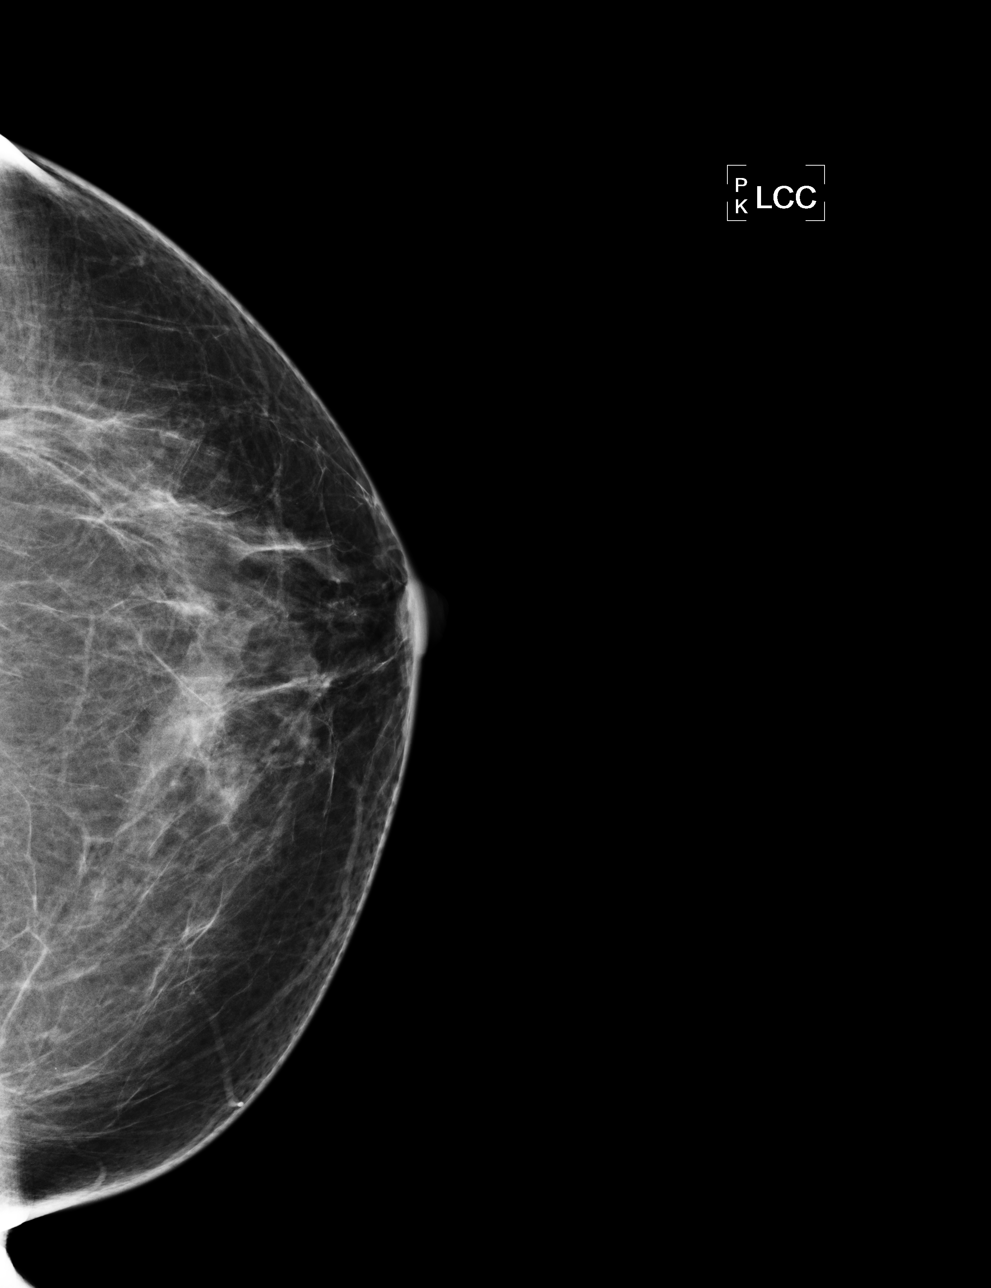

[L MLO]
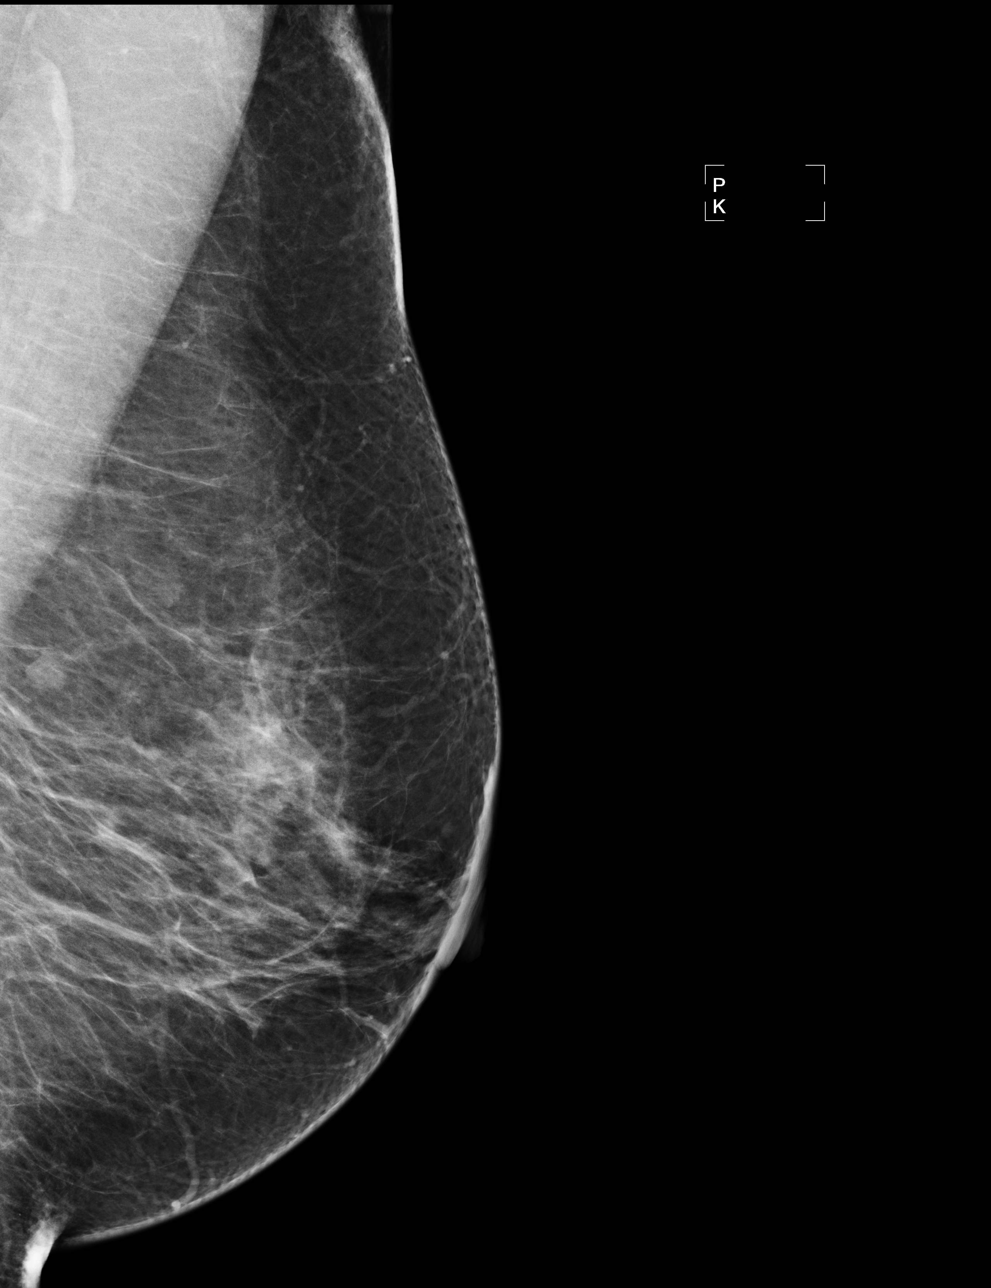

[R MLO]
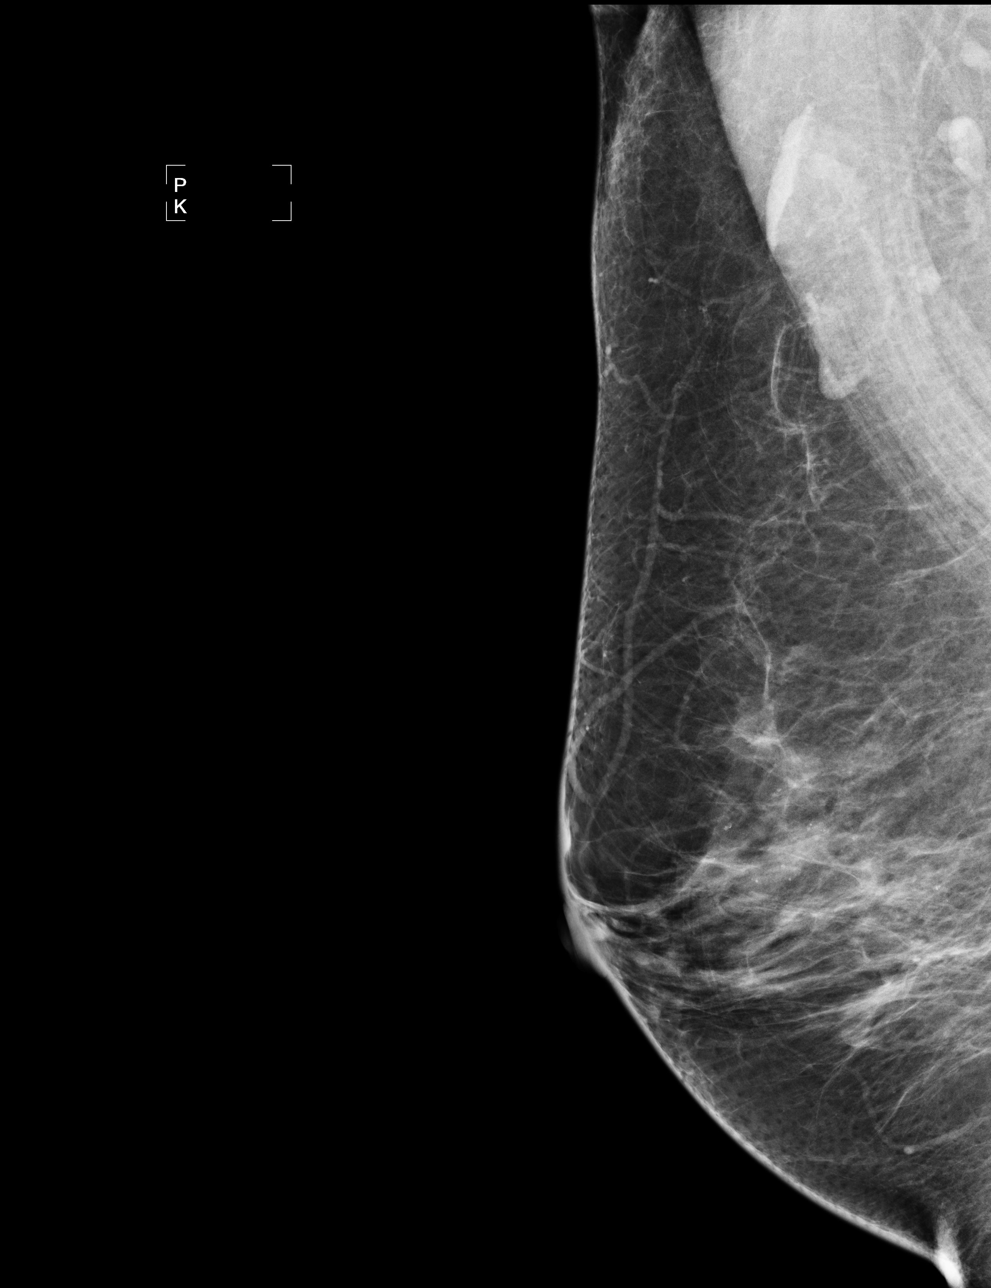

[4 of 4 positions shown; findings below may reference images not displayed]

ACR Breast Density Category b: There are scattered areas of
fibroglandular density.
FINDINGS: There has been no significant interval change and there are no
findings suspicious for malignancy. Images were processed with CAD.
IMPRESSION: No mammographic evidence of malignancy. A result letter of this
screening mammogram will be mailed directly to the patient.

RECOMMENDATION:
Screening mammogram in one year. (Code:[id number])

BI-RADS CATEGORY  1: Negative.

## 2017-03-11 DIAGNOSIS — M5412 Radiculopathy, cervical region: Secondary | ICD-10-CM | POA: Diagnosis not present

## 2017-03-18 DIAGNOSIS — M542 Cervicalgia: Secondary | ICD-10-CM | POA: Diagnosis not present

## 2017-03-22 DIAGNOSIS — M5412 Radiculopathy, cervical region: Secondary | ICD-10-CM | POA: Diagnosis not present

## 2017-04-06 DIAGNOSIS — M5412 Radiculopathy, cervical region: Secondary | ICD-10-CM | POA: Diagnosis not present

## 2017-05-02 ENCOUNTER — Ambulatory Visit (HOSPITAL_COMMUNITY)
Admission: EM | Admit: 2017-05-02 | Discharge: 2017-05-02 | Disposition: A | Discharge status: Home / Self Care | Payer: BLUE CROSS/BLUE SHIELD | Attending: Internal Medicine | Admitting: Internal Medicine

## 2017-05-02 ENCOUNTER — Ambulatory Visit (INDEPENDENT_AMBULATORY_CARE_PROVIDER_SITE_OTHER): Payer: BLUE CROSS/BLUE SHIELD

## 2017-05-02 ENCOUNTER — Encounter (HOSPITAL_COMMUNITY): Payer: Self-pay | Admitting: Family Medicine

## 2017-05-02 DIAGNOSIS — S93402A Sprain of unspecified ligament of left ankle, initial encounter: Secondary | ICD-10-CM

## 2017-05-02 DIAGNOSIS — M25572 Pain in left ankle and joints of left foot: Secondary | ICD-10-CM

## 2017-05-02 DIAGNOSIS — W19XXXA Unspecified fall, initial encounter: Secondary | ICD-10-CM

## 2017-05-02 DIAGNOSIS — S99912A Unspecified injury of left ankle, initial encounter: Secondary | ICD-10-CM | POA: Diagnosis not present

## 2017-05-02 DIAGNOSIS — W108XXA Fall (on) (from) other stairs and steps, initial encounter: Secondary | ICD-10-CM

## 2017-05-02 DIAGNOSIS — M7989 Other specified soft tissue disorders: Secondary | ICD-10-CM | POA: Diagnosis not present

## 2017-05-02 NOTE — ED Provider Notes (Signed)
[place]    CSN: [id number] Arrival date & time: 05/02/17  1601     History   Chief Complaint Chief Complaint  Patient presents with  . Fall  . Foot Pain    HPI Kymberlie Brazeau is a 62 y.o. female.   Seleena presents with complaints of left ankle pain after she fell down two steps this morning. She has been ambulatory since but with throbbing pain. Worse with weight bearing but after some activity pain does improve. Swelling present. Took gabapentin which did not help with pain. Denies previous ankle injury. Denies numbness or tingling to toes or foot. Denies any other injury with pain. History of gerd, htn, dm, djd, cva, asthma.   ROS per HPI.       Past Medical History:  Diagnosis Date  . Allergic rhinitis   . ASTHMA   . BICUSPID AORTIC VALVE   . CEREBROVASCULAR DISEASE   . DEGENERATIVE JOINT DISEASE   . Diabetes mellitus   . DJD (degenerative joint disease)   . GERD   . HSV-1 (herpes simplex virus 1) infection   . HYPERLIPIDEMIA   . Hypertension   . Palpitation   . Vitamin D deficiency     Patient Active Problem List   Diagnosis Date Noted  . Diabetes mellitus without complication ([place]) 73/71/0626  . Esotropia of right eye 04/22/2015  . Palpitations 04/11/2015  . Abnormal electrocardiogram 04/11/2015  . Sleep disorder, shift-work 10/13/2014  . Left carotid bruit 02/14/2014  . Medication management 02/14/2014  . Essential hypertension 07/27/2013  . Hyperlipidemia 02/01/2013  . Vitamin D deficiency 02/01/2013  . BICUSPID AORTIC VALVE 07/03/2008  . Cerebrovascular disease 07/02/2008  . Asthma 07/02/2008  . GERD 07/02/2008  . Osteoarthritis 07/02/2008    Past Surgical History:  Procedure Laterality Date  . ABDOMINAL HYSTERECTOMY    . repair of trigger finger      OB History    No data available       Home Medications    Prior to Admission medications   Medication Sig Start Date End Date Taking? Authorizing Provider    acyclovir (ZOVIRAX) 400 MG tablet Take 1 pill 3 times daily with food and 2 pills at night 10/20/16   Vicie Mutters, PA-C  albuterol (PROVENTIL HFA;VENTOLIN HFA) 108 (90 Base) MCG/ACT inhaler Use 1  To 2 inhalations 5 minutes apart every 4 hours if needed to rescue asthma 09/05/15 09/04/16  [name], MD  albuterol (PROVENTIL HFA;VENTOLIN HFA) 108 (90 Base) MCG/ACT inhaler Use 1  To 2 inhalations 5 minutes apart every 4 hours if needed to rescue asthma 09/05/15   [provider]  aspirin EC 81 MG tablet Take 81 mg by mouth daily.    [provider]  B Complex-C (SUPER B COMPLEX PO) Take 1 tablet by mouth daily. Takes every other day    [provider]  BAYER MICROLET LANCETS lancets Check blood sugar 1 time daily-DX-E11.9. 04/08/16   [name], MD  canagliflozin (INVOKANA) 300 MG TABS tablet TAKE 1 TABLET BY MOUTH EVERY DAY FOR DIABETES 01/13/16   [name], MD  Cholecalciferol (VITAMIN D PO) Take 1 capsule by mouth daily. Take 10,000 units by mouth once a day    [provider]  Cinnamon 500 MG capsule Take 1,000 mg by mouth daily.     [provider]  Diclofenac Sodium 1.5 % SOLN Place a small amount of gel on the bilateral hands for hand arthritis 02/19/16   Starlyn Skeans,  PA-C  fluconazole (DIFLUCAN) 150 MG tablet Use once for yeast infection as needed 11/17/16   [name], MD  furosemide (LASIX) 20 MG tablet Take 1 tablet (20 mg total) by mouth daily. 03/19/16 03/19/17  [name], MD  gabapentin (NEURONTIN) 100 MG capsule TAKE 1-3 PILLS AT NIGHT 09/05/16   [name], MD  glucose blood (BAYER CONTOUR NEXT TEST) test strip Check blood sugar 1 time daily-DX-E11.9. 04/06/16   [name], MD  lisinopril-hydrochlorothiazide (PRINZIDE,ZESTORETIC) 10-12.5 MG tablet Take 1 tablet by mouth every morning. 11/17/16   [name], MD  MAGNESIUM PO Take 2 tablets by mouth daily.    [provider]  meloxicam  (MOBIC) 15 MG tablet Take 1 tablet (15 mg total) by mouth as needed for pain. 11/17/16   [name], MD  metFORMIN (GLUCOPHAGE-XR) 500 MG 24 hr tablet Take 2 tablets 2 x / day with meals for Diabetes 10/07/16 05/07/17  Vicie Mutters, PA-C  metoprolol succinate (TOPROL-XL) 25 MG 24 hr tablet TAKE 1 TABLET BY MOUTH DAILY 11/10/16   [name], MD  mometasone (NASONEX) 50 MCG/ACT nasal spray Place 2 sprays into the nose daily. 09/07/16 09/07/17  [name], MD  Multiple Vitamin (MULITIVITAMIN WITH MINERALS) TABS Take 1 tablet by mouth daily.    [provider]  Omega-3 Fatty Acids (FISH OIL) 1000 MG CAPS Take 1 capsule (1,000 mg total) by mouth daily. 02/01/13   [name], MD  OVER THE COUNTER MEDICATION 500 mg daily. Tumeric 500 mg daily    [provider]  potassium chloride SA (KLOR-CON M20) 20 MEQ tablet Take 1 tablet (20 mEq total) by mouth 3 (three) times daily. 12/06/16   [name], MD  pravastatin (PRAVACHOL) 40 MG tablet Take 1/2 to 1 tablet daily or as directed 12/27/16 12/27/17  [name], MD  Psyllium (METAMUCIL PO) Take 1 Dose by mouth daily.    [provider]  ranitidine (ZANTAC) 300 MG tablet TAKE 1 TABLET BY MOUTH DAILY 11/10/16   [name], MD  rOPINIRole (REQUIP) 1 MG tablet TAKE 1/2 TO 1 TABLET BY MOUTH 3 TIMES A DAY AS NEEDED FOR RESTLESS LEGS 12/01/16   [name], MD  traMADol (ULTRAM) 50 MG tablet Take 1 tablet (50 mg total) by mouth as needed. 06/03/14   Vicie Mutters, PA-C  triamcinolone cream (KENALOG) 0.1 % Apply 1 application topically 4 (four) times daily as needed. 01/25/17   [name], MD    Family History Family History  Problem Relation Age of Onset  . Coronary artery disease Mother   . Heart disease Mother   . Hypertension Mother   . Diabetes Father   . Hypertension Father     Social History Social History   Tobacco Use  . Smoking status: Never Smoker  . Smokeless tobacco:  Never Used  Substance Use Topics  . Alcohol use: Yes    Comment: very rarely  . Drug use: No     Allergies   Naprosyn [naproxen]   Review of Systems Review of Systems   Physical Exam Triage Vital Signs ED Triage Vitals  Enc Vitals Group     BP 05/02/17 1647 119/63     Pulse Rate 05/02/17 1647 83     Resp 05/02/17 1647 18     Temp 05/02/17 1647 98.5 F (36.9 C)     Temp src --      SpO2 05/02/17 1647 100 %     Weight --      Height --  Head Circumference --      Peak Flow --      Pain Score 05/02/17 1644 3     Pain Loc --      Pain Edu? --      Excl. in [place]? --    No data found.  Updated Vital Signs BP 119/63   Pulse 83   Temp 98.5 F (36.9 C)   Resp 18   SpO2 100%   Visual Acuity Right Eye Distance:   Left Eye Distance:   Bilateral Distance:    Right Eye Near:   Left Eye Near:    Bilateral Near:     Physical Exam  Constitutional: She is oriented to person, place, and time. She appears well-developed and well-nourished. No distress.  Cardiovascular: Normal rate, regular rhythm and normal heart sounds.  Pulmonary/Chest: Effort normal and breath sounds normal.  Musculoskeletal:       Left ankle: She exhibits swelling. She exhibits normal range of motion, no ecchymosis, no deformity, no laceration and normal pulse. Tenderness. Achilles tendon normal.       Feet:  Tenderness at anterior ankle with swelling noted to lateral ankle; sensation intact; full ROm to ankle; strong pedal pulse; cap refill <2 seconds. ambulatory  Neurological: She is alert and oriented to person, place, and time.  Skin: Skin is warm and dry.     UC Treatments / Results  Labs (all labs ordered are listed, but only abnormal results are displayed) Labs Reviewed - No data to display  EKG  EKG Interpretation None       Radiology Dg Ankle Complete Left  Result Date: 05/02/2017 CLINICAL DATA:  Fall with left ankle pain EXAM: LEFT ANKLE COMPLETE - 3+ VIEW COMPARISON:   None. FINDINGS: No fracture or dislocation. Moderate soft tissue swelling over the lateral malleolus. IMPRESSION: No acute osseous abnormality. Moderate left lateral malleolar soft tissue swelling. Electronically Signed   By: Ulyses Jarred M.D.   On: 05/02/2017 17:07    Procedures Procedures (including critical care time)  Medications Ordered in UC Medications - No data to display   Initial Impression / Assessment and Plan / UC Course  I have reviewed the triage vital signs and the nursing notes.  Pertinent labs & imaging results that were available during my care of the patient were reviewed by me and considered in my medical decision making (see chart for details).     History, physical and xray consistent with strain. Ankle brace provide. RICE therapy, nsaids recommended. Patient already has meloxicam but does not take regularly, recommended use. Return precautions provided. Patient verbalized understanding and agreeable to plan.  Ambulatory out of clinic without difficulty.    Final Clinical Impressions(s) / UC Diagnoses   Final diagnoses:  Sprain of left ankle, unspecified ligament, initial encounter    ED Discharge Orders    None       Controlled Substance Prescriptions [place] Controlled Substance Registry consulted? Not Applicable   Zigmund Gottron, NP 05/02/17 [phone]

## 2017-05-02 NOTE — ED Triage Notes (Addendum)
Pt here for fall and injury to left foot and ankle that occurred this am. sts some tightness in her foot. She ambulated to triage with limp.

## 2017-05-02 NOTE — Discharge Instructions (Signed)
Ice, elevation, daily meloxicam for pain control. Ankle brace with activity. Activity as tolerated. If symptoms worsen or do not improve in the next 3-4 weeks to return to be seen or to follow up with PCP.

## 2017-05-30 ENCOUNTER — Other Ambulatory Visit: Payer: Self-pay | Admitting: Internal Medicine

## 2017-05-30 DIAGNOSIS — E119 Type 2 diabetes mellitus without complications: Secondary | ICD-10-CM

## 2017-05-31 ENCOUNTER — Other Ambulatory Visit: Payer: Self-pay | Admitting: *Deleted

## 2017-05-31 ENCOUNTER — Ambulatory Visit: Payer: BLUE CROSS/BLUE SHIELD | Admitting: Internal Medicine

## 2017-05-31 ENCOUNTER — Encounter: Payer: Self-pay | Admitting: Internal Medicine

## 2017-05-31 VITALS — BP 110/58 | HR 72 | Temp 97.6°F | Resp 16 | Ht <= 58 in | Wt 121.6 lb

## 2017-05-31 DIAGNOSIS — E782 Mixed hyperlipidemia: Secondary | ICD-10-CM

## 2017-05-31 DIAGNOSIS — Z79899 Other long term (current) drug therapy: Secondary | ICD-10-CM

## 2017-05-31 DIAGNOSIS — E1140 Type 2 diabetes mellitus with diabetic neuropathy, unspecified: Secondary | ICD-10-CM

## 2017-05-31 DIAGNOSIS — K219 Gastro-esophageal reflux disease without esophagitis: Secondary | ICD-10-CM | POA: Diagnosis not present

## 2017-05-31 DIAGNOSIS — I10 Essential (primary) hypertension: Secondary | ICD-10-CM | POA: Diagnosis not present

## 2017-05-31 DIAGNOSIS — E559 Vitamin D deficiency, unspecified: Secondary | ICD-10-CM

## 2017-05-31 MED ORDER — PHENTERMINE HCL 37.5 MG PO TABS: ORAL_TABLET | ORAL | 5 refills | Status: DC

## 2017-05-31 MED ORDER — FUROSEMIDE 20 MG PO TABS: 20.0000 mg | ORAL_TABLET | Freq: Every day | ORAL | 1 refills | Status: DC

## 2017-05-31 MED ORDER — ALBUTEROL SULFATE HFA 108 (90 BASE) MCG/ACT IN AERS: INHALATION_SPRAY | RESPIRATORY_TRACT | 1 refills | Status: DC

## 2017-05-31 NOTE — Progress Notes (Signed)
This very nice 62 y.o. MBF presents for 6 month follow up with HTN, HLD, T2_NIDDM and Vitamin D Deficiency.      Patient is treated for HTN (1996) & BP has been controlled at home. Today's BP is at goal 110/58.[name] Patient has had no complaints of any cardiac type chest pain, palpitations, dyspnea / orthopnea / PND, dizziness, claudication, or dependent edema.     Hyperlipidemia is controlled with diet & meds. Patient denies myalgias or other med SE's. Last Lipids were at goal: Lab Results  Component Value Date   CHOL 129 02/22/2017   HDL 49 (L) 02/22/2017   LDLCALC 65 02/22/2017   TRIG 73 02/22/2017   CHOLHDL 2.6 02/22/2017      Also, the patient has history of T2_NIDDM  (1996) and has had no symptoms of reactive hypoglycemia, diabetic polys or visual blurring, but does report occasional burning paresthesias of her feet.  Last A1c was not at goal: Lab Results  Component Value Date   HGBA1C 7.1 (H) 02/22/2017      Further, the patient also has history of Vitamin D Deficiency ("30"/2008) and supplements vitamin D without any suspected side-effects. Last vitamin D was sl elevated & dose was decreased:  Lab Results  Component Value Date   VD25OH 119 (H) 02/22/2017   Current Outpatient Medications on File Prior to Visit  Medication Sig  . acyclovir (ZOVIRAX) 400 MG tablet Take 1 pill 3 times daily with food and 2 pills at night  . aspirin EC 81 MG tablet Take 81 mg by mouth daily.  . B Complex-C (SUPER B COMPLEX PO) Take 1 tablet by mouth daily. Takes every other day  . BAYER MICROLET LANCETS lancets Check blood sugar 1 time daily-DX-E11.9.  [name] canagliflozin (INVOKANA) 300 MG TABS tablet TAKE 1 TABLET BY MOUTH EVERY DAY FOR DIABETES  . Cholecalciferol (VITAMIN D PO) Take 1 capsule by mouth daily. Take 5000 units by mouth once a day  . Cinnamon 500 MG capsule Take 1,000 mg by mouth daily.   . Diclofenac Sodium 1.5 % SOLN Place a small amount of gel on the bilateral hands for hand  arthritis  . fluconazole (DIFLUCAN) 150 MG tablet Use once for yeast infection as needed  . gabapentin (NEURONTIN) 100 MG capsule TAKE 1-3 PILLS AT NIGHT  . glucose blood (BAYER CONTOUR NEXT TEST) test strip Check blood sugar 1 time daily-DX-E11.9.  . lisinopril-hydrochlorothiazide (PRINZIDE,ZESTORETIC) 10-12.5 MG tablet Take 1 tablet by mouth every morning.  [name] MAGNESIUM PO Take 2 tablets by mouth daily.  . meloxicam (MOBIC) 15 MG tablet Take 1 tablet (15 mg total) by mouth as needed for pain.  . metFORMIN (GLUCOPHAGE-XR) 500 MG 24 hr tablet TAKE 2 TABLETS BY MOUTH TWO TIMES A DAY WITH MEALS FOR DIABETES. DISCOUNTINUE METFORMIN 1000MG   . metoprolol succinate (TOPROL-XL) 25 MG 24 hr tablet TAKE 1 TABLET BY MOUTH DAILY  . mometasone (NASONEX) 50 MCG/ACT nasal spray Place 2 sprays into the nose daily.  . Multiple Vitamin (MULITIVITAMIN WITH MINERALS) TABS Take 1 tablet by mouth daily.  . potassium chloride SA (KLOR-CON M20) 20 MEQ tablet Take 1 tablet (20 mEq total) by mouth 3 (three) times daily.  . pravastatin (PRAVACHOL) 40 MG tablet Take 1/2 to 1 tablet daily or as directed  . Psyllium (METAMUCIL PO) Take 1 Dose by mouth daily.  . ranitidine (ZANTAC) 300 MG tablet TAKE 1 TABLET BY MOUTH DAILY  . rOPINIRole (REQUIP) 1 MG tablet TAKE 1/2  TO 1 TABLET BY MOUTH 3 TIMES A DAY AS NEEDED FOR RESTLESS LEGS  . traMADol (ULTRAM) 50 MG tablet Take 1 tablet (50 mg total) by mouth as needed. (Patient not taking: Reported on 05/31/2017)  . triamcinolone cream (KENALOG) 0.1 % Apply 1 application topically 4 (four) times daily as needed.   No current facility-administered medications on file prior to visit.    Allergies  Allergen Reactions  . Naprosyn [Naproxen] Other (See Comments)    GI upset   PMHx:   Past Medical History:  Diagnosis Date  . Allergic rhinitis   . ASTHMA   . BICUSPID AORTIC VALVE   . CEREBROVASCULAR DISEASE   . DEGENERATIVE JOINT DISEASE   . Diabetes mellitus   . DJD  (degenerative joint disease)   . GERD   . HSV-1 (herpes simplex virus 1) infection   . HYPERLIPIDEMIA   . Hypertension   . Palpitation   . Vitamin D deficiency    Immunization History  Administered Date(s) Administered  . DT 10/11/2014  . DTaP 03/18/2004  . Influenza Inj Mdck Quad With Preservative 11/17/2016  . Influenza Split 11/15/2013, 11/30/2014  . Influenza Whole 11/02/2012  . Influenza-Unspecified 10/31/2015  . PPD Test 07/30/2013, 10/11/2014, 11/06/2015, 11/17/2016  . Pneumococcal Conjugate-13 11/15/2013, 11/17/2016  . Pneumococcal Polysaccharide-23 05/16/1996   Past Surgical History:  Procedure Laterality Date  . ABDOMINAL HYSTERECTOMY    . repair of trigger finger     FHx:    Reviewed / unchanged  SHx:    Reviewed / unchanged  Systems Review:  Constitutional: Denies fever, chills, wt changes, headaches, insomnia, fatigue, night sweats, change in appetite. Eyes: Denies redness, blurred vision, diplopia, discharge, itchy, watery eyes.  ENT: Denies discharge, congestion, post nasal drip, epistaxis, sore throat, earache, hearing loss, dental pain, tinnitus, vertigo, sinus pain, snoring.  CV: Denies chest pain, palpitations, irregular heartbeat, syncope, dyspnea, diaphoresis, orthopnea, PND, claudication or edema. Respiratory: denies cough, dyspnea, DOE, pleurisy, hoarseness, laryngitis, wheezing.  Gastrointestinal: Denies dysphagia, odynophagia, heartburn, reflux, water brash, abdominal pain or cramps, nausea, vomiting, bloating, diarrhea, constipation, hematemesis, melena, hematochezia  or hemorrhoids. Genitourinary: Denies dysuria, frequency, urgency, nocturia, hesitancy, discharge, hematuria or flank pain. Musculoskeletal: Denies arthralgias, myalgias, stiffness, jt. swelling, pain, limping or strain/sprain.  Skin: Denies pruritus, rash, hives, warts, acne, eczema or change in skin lesion(s). Neuro: No weakness, tremor, incoordination, spasms, paresthesia or  pain. Psychiatric: Denies confusion, memory loss or sensory loss. Endo: Denies change in weight, skin or hair change.  Heme/Lymph: No excessive bleeding, bruising or enlarged lymph nodes.  Physical Exam  BP (!) 110/58   Pulse 72   Temp 97.6 F (36.4 C)   Resp 16   Ht 4\' 10"  (1.473 m)   Wt 121 lb 9.6 oz (55.2 kg)   BMI 25.41 kg/m   Appears  well nourished, well groomed  and in no distress.  Eyes: PERRLA, EOMs, conjunctiva no swelling or erythema. Sinuses: No frontal/maxillary tenderness ENT/Mouth: EAC's clear, TM's nl w/o erythema, bulging. Nares clear w/o erythema, swelling, exudates. Oropharynx clear without erythema or exudates. Oral hygiene is good. Tongue normal, non obstructing. Hearing intact.  Neck: Supple. Thyroid not palpable. Car 2+/2+ without bruits, nodes or JVD. Chest: Respirations nl with BS clear & equal w/o rales, rhonchi, wheezing or stridor.  Cor: Heart sounds normal w/ regular rate and rhythm without sig. murmurs, gallops, clicks or rubs. Peripheral pulses normal and equal  without edema.  Abdomen: Soft & bowel sounds normal. Non-tender w/o guarding, rebound, hernias, masses  or organomegaly.  Lymphatics: Unremarkable.  Musculoskeletal: Full ROM all peripheral extremities, joint stability, 5/5 strength and normal gait.  Skin: Warm, dry without exposed rashes, lesions or ecchymosis apparent.  Neuro: Cranial nerves intact, reflexes equal bilaterally. Motor testing grossly intact. Tendon reflexes grossly intact. Sensation intact to touch, vibratory and Monofilament to the toes bilaterally. Pysch: Alert & oriented x 3.  Insight and judgement nl & appropriate. No ideations.  Assessment and Plan:  1. Essential hypertension  - Continue medication, monitor blood pressure at home.  - Continue DASH diet.  Reminder to go to the ER if any CP,  SOB, nausea, dizziness, severe HA, changes vision/speech.  - CBC with Differential/Platelet - BASIC METABOLIC PANEL WITH GFR -  Magnesium - TSH  2. Hyperlipidemia, mixed  - Continue diet/meds, exercise,& lifestyle modifications.  - Continue monitor periodic cholesterol/liver & renal functions   - Hepatic function panel - Lipid panel - TSH  3. Type 2 diabetes mellitus with diabetic neuropathy, without long-term current use of insulin (HCC)  - Continue diet, exercise, lifestyle modifications.  - Monitor appropriate labs.  - Hemoglobin A1c - Insulin, random  4. Vitamin D deficiency  - Continue supplementation.   - VITAMIN D 25 Hydroxyl  5. Gastroesophageal reflux disease  - CBC with Differential/Platelet  6. Medication management - CBC with Differential/Platelet - BASIC METABOLIC PANEL WITH GFR - Hepatic function panel - Magnesium - Lipid panel - TSH - Hemoglobin A1c - Insulin, random - VITAMIN D 25 Hydroxyl          Discussed  regular exercise, BP monitoring, weight control to achieve/maintain BMI less than 25 and discussed med and SE's. Recommended labs to assess and monitor clinical status with further disposition pending results of labs. Over 30 minutes of exam, counseling, chart review was performed.

## 2017-05-31 NOTE — Patient Instructions (Signed)

## 2017-06-01 LAB — LIPID PANEL
Cholesterol: 155 mg/dL (ref <200)
HDL: 51 mg/dL (ref >50)
LDL Cholesterol (Calc): 91 mg/dL (calc)
Non-HDL Cholesterol (Calc): 104 mg/dL (calc) (ref <130)
Total CHOL/HDL Ratio: 3 (calc) (ref <5.0)
Triglycerides: 51 mg/dL (ref <150)

## 2017-06-01 LAB — HEPATIC FUNCTION PANEL
AG Ratio: 1.8 (calc) (ref 1.0–2.5)
ALT: 15 U/L (ref 6–29)
AST: 16 U/L (ref 10–35)
Albumin: 4.7 g/dL (ref 3.6–5.1)
Alkaline phosphatase (APISO): 65 U/L (ref 33–130)
Bilirubin, Direct: 0.1 mg/dL (ref 0.0–0.2)
Globulin: 2.6 g/dL (calc) (ref 1.9–3.7)
Indirect Bilirubin: 0.2 mg/dL (calc) (ref 0.2–1.2)
Total Bilirubin: 0.3 mg/dL (ref 0.2–1.2)
Total Protein: 7.3 g/dL (ref 6.1–8.1)

## 2017-06-01 LAB — MAGNESIUM: Magnesium: 1.7 mg/dL (ref 1.5–2.5)

## 2017-06-01 LAB — CBC WITH DIFFERENTIAL/PLATELET
Basophils Absolute: 43 cells/uL (ref 0–200)
Basophils Relative: 0.6 %
Eosinophils Absolute: 79 cells/uL (ref 15–500)
Eosinophils Relative: 1.1 %
HCT: 42.9 % (ref 35.0–45.0)
Hemoglobin: 14.5 g/dL (ref 11.7–15.5)
Lymphs Abs: 2074 cells/uL (ref 850–3900)
MCH: 31.7 pg (ref 27.0–33.0)
MCHC: 33.8 g/dL (ref 32.0–36.0)
MCV: 93.7 fL (ref 80.0–100.0)
MPV: 10.7 fL (ref 7.5–12.5)
Monocytes Relative: 8 %
Neutro Abs: 4428 cells/uL (ref 1500–7800)
Neutrophils Relative %: 61.5 %
Platelets: 288 10*3/uL (ref 140–400)
RBC: 4.58 10*6/uL (ref 3.80–5.10)
RDW: 12.6 % (ref 11.0–15.0)
Total Lymphocyte: 28.8 %
WBC mixed population: 576 cells/uL (ref 200–950)
WBC: 7.2 10*3/uL (ref 3.8–10.8)

## 2017-06-01 LAB — HEMOGLOBIN A1C
Hgb A1c MFr Bld: 6.8 % of total Hgb — ABNORMAL HIGH (ref <5.7)
Mean Plasma Glucose: 148 (calc)
eAG (mmol/L): 8.2 (calc)

## 2017-06-01 LAB — INSULIN, RANDOM: Insulin: 5.5 u[IU]/mL (ref 2.0–19.6)

## 2017-06-01 LAB — VITAMIN D 25 HYDROXY (VIT D DEFICIENCY, FRACTURES): Vit D, 25-Hydroxy: 92 ng/mL (ref 30–100)

## 2017-06-01 LAB — BASIC METABOLIC PANEL WITH GFR
BUN: 15 mg/dL (ref 7–25)
CO2: 32 mmol/L (ref 20–32)
Calcium: 9.7 mg/dL (ref 8.6–10.4)
Chloride: 99 mmol/L (ref 98–110)
Creat: 0.56 mg/dL (ref 0.50–0.99)
GFR, Est African American: 117 mL/min/{1.73_m2} (ref >=60)
GFR, Est Non African American: 101 mL/min/{1.73_m2} (ref >=60)
Glucose, Bld: 98 mg/dL (ref 65–99)
Potassium: 4 mmol/L (ref 3.5–5.3)
Sodium: 141 mmol/L (ref 135–146)

## 2017-06-01 LAB — TSH: TSH: 1.96 mIU/L (ref 0.40–4.50)

## 2017-06-08 ENCOUNTER — Other Ambulatory Visit: Payer: Self-pay | Admitting: Internal Medicine

## 2017-06-08 MED ORDER — PHENTERMINE HCL 37.5 MG PO TABS: ORAL_TABLET | ORAL | 5 refills | Status: DC

## 2017-06-15 ENCOUNTER — Other Ambulatory Visit: Payer: Self-pay | Admitting: *Deleted

## 2017-06-15 MED ORDER — MOMETASONE FUROATE 50 MCG/ACT NA SUSP: 2.0000 | Freq: Every day | NASAL | 3 refills | Status: DC

## 2017-06-20 ENCOUNTER — Other Ambulatory Visit: Payer: Self-pay

## 2017-06-20 MED ORDER — ALBUTEROL SULFATE HFA 108 (90 BASE) MCG/ACT IN AERS: INHALATION_SPRAY | RESPIRATORY_TRACT | 1 refills | Status: DC

## 2017-06-20 NOTE — Telephone Encounter (Signed)
Refill request for Proventil HFA.

## 2017-06-22 ENCOUNTER — Other Ambulatory Visit: Payer: Self-pay | Admitting: Internal Medicine

## 2017-06-23 ENCOUNTER — Other Ambulatory Visit: Payer: Self-pay | Admitting: *Deleted

## 2017-06-23 MED ORDER — PRAVASTATIN SODIUM 40 MG PO TABS: ORAL_TABLET | ORAL | 1 refills | Status: DC

## 2017-07-08 DIAGNOSIS — E119 Type 2 diabetes mellitus without complications: Secondary | ICD-10-CM | POA: Diagnosis not present

## 2017-07-08 LAB — HM DIABETES EYE EXAM

## 2017-08-21 ENCOUNTER — Other Ambulatory Visit: Payer: Self-pay | Admitting: Adult Health

## 2017-08-21 DIAGNOSIS — E119 Type 2 diabetes mellitus without complications: Secondary | ICD-10-CM

## 2017-09-01 NOTE — Progress Notes (Signed)
FOLLOW UP  Assessment and Plan:   Hypertension Well controlled with current medications  Monitor blood pressure at home; patient to call if consistently greater than 130/80 Continue DASH diet.   Reminder to go to the ER if any CP, SOB, nausea, dizziness, severe HA, changes vision/speech, left arm numbness and tingling and jaw pain.  Cholesterol Currently at goal; continue pravastatin 40 mg  Continue low cholesterol diet and exercise.  Check lipid panel.   Diabetes with diabetic chronic kidney disease Continue medication: metformin, invokana Continue diet and exercise.  Perform daily foot/skin check, notify office of any concerning changes.  Check A1C  Vitamin D Def At goal at last visit; continue supplementation to maintain goal of 70-100 Defer Vit D level  BMI 25  Continue to recommend diet heavy in fruits and veggies and low in animal meats, cheeses, and dairy products, appropriate calorie intake Discuss exercise recommendations routinely Continue to monitor weight at each visit   Continue diet and meds as discussed. Further disposition pending results of labs. Discussed med's effects and SE's.   Over 30 minutes of exam, counseling, chart review, and critical decision making was performed.   Future Appointments  Date Time Provider [place]  12/12/2017  9:00 AM [name], MD [facility] None    ----------------------------------------------------------------------------------------------------------------------  HPI 62 y.o. female  presents for 3 month follow up on hypertension, cholesterol, diabetes, weight and vitamin D deficiency. She is followed by Dr. Stanford Breed for cerebrovascular disease with most recent dopplers in 2017 showing mild disease only, with recommendation to continue ASA and statin.   BMI is Body mass index is 25.08 kg/m., she has not been working on diet and exercise. She is on phentermine for weight management and also due to severe  somnolence related to shift work.  Wt Readings from Last 3 Encounters:  09/02/17 120 lb (54.4 kg)  05/31/17 121 lb 9.6 oz (55.2 kg)  02/22/17 123 lb (55.8 kg)   She has not been checking her BP at home, today their BP is BP: (!) 104/54  She does not workout. She denies chest pain, shortness of breath, dizziness.   She is on cholesterol medication (pravastatin 40 mg every other day) and denies myalgias. Her cholesterol is at goal. The cholesterol last visit was:   Lab Results  Component Value Date   CHOL 155 05/31/2017   HDL 51 05/31/2017   LDLCALC 91 05/31/2017   TRIG 51 05/31/2017   CHOLHDL 3.0 05/31/2017    She has not been working on diet and exercise for T2 diabetes (on metformin, invokana, cinnamon), and denies increased appetite, nausea, paresthesia of the feet, polydipsia, polyuria, visual disturbances and vomiting. She reports she has not been checking her sugars despite being advised to do so. She does report she had sensation of hypoglycemia, didn't check her sugar but just had a snack and quickly recovered. Last A1C in the office was:  Lab Results  Component Value Date   HGBA1C 6.8 (H) 05/31/2017   Patient is on Vitamin D supplement and above goal at the last check:    Lab Results  Component Value Date   VD25OH 92 05/31/2017        Current Medications:  Current Outpatient Medications on File Prior to Visit  Medication Sig  . acyclovir (ZOVIRAX) 400 MG tablet Take 1 pill 3 times daily with food and 2 pills at night  . albuterol (PROVENTIL HFA;VENTOLIN HFA) 108 (90 Base) MCG/ACT inhaler Use 1  To 2 inhalations 5 minutes  apart every 4 hours if needed to rescue asthma  . aspirin EC 81 MG tablet Take 81 mg by mouth daily.  . B Complex-C (SUPER B COMPLEX PO) Take 1 tablet by mouth daily. Takes every other day  . BAYER MICROLET LANCETS lancets Check blood sugar 1 time daily-DX-E11.9.  [name] canagliflozin (INVOKANA) 300 MG TABS tablet TAKE 1 TABLET BY MOUTH EVERY DAY FOR  DIABETES  . Cholecalciferol (VITAMIN D PO) Take 1 capsule by mouth daily. Take 5000 units by mouth once a day  . Cinnamon 500 MG capsule Take 1,000 mg by mouth daily.   . Diclofenac Sodium 1.5 % SOLN Place a small amount of gel on the bilateral hands for hand arthritis  . fluconazole (DIFLUCAN) 150 MG tablet Use once for yeast infection as needed  . furosemide (LASIX) 20 MG tablet Take 1 tablet (20 mg total) by mouth daily.  [name] gabapentin (NEURONTIN) 100 MG capsule TAKE 1-3 PILLS AT NIGHT (Patient taking differently: Take 300mg  tablet by mouth once a day)  . glucose blood (BAYER CONTOUR NEXT TEST) test strip Check blood sugar 1 time daily-DX-E11.9.  . lisinopril-hydrochlorothiazide (PRINZIDE,ZESTORETIC) 10-12.5 MG tablet Take 1 tablet by mouth every morning.  [name] MAGNESIUM PO Take 2 tablets by mouth daily.  . meloxicam (MOBIC) 15 MG tablet Take 1 tablet (15 mg total) by mouth as needed for pain.  . metFORMIN (GLUCOPHAGE-XR) 500 MG 24 hr tablet Take 2 tablets 2 x /day with meals for Diabetes  . metoprolol succinate (TOPROL-XL) 25 MG 24 hr tablet TAKE 1 TABLET BY MOUTH DAILY  . mometasone (NASONEX) 50 MCG/ACT nasal spray Place 2 sprays into the nose daily.  . Multiple Vitamin (MULITIVITAMIN WITH MINERALS) TABS Take 1 tablet by mouth daily.  . phentermine (ADIPEX-P) 37.5 MG tablet Take 1/2 to 1 tablet every morning for dieting & weightloss  . potassium chloride SA (KLOR-CON M20) 20 MEQ tablet Take 1 tablet (20 mEq total) by mouth 3 (three) times daily.  . pravastatin (PRAVACHOL) 40 MG tablet Take 1/2 to 1 tablet daily or as directed  . Psyllium (METAMUCIL PO) Take 1 Dose by mouth daily.  . ranitidine (ZANTAC) 300 MG tablet TAKE 1 TABLET BY MOUTH DAILY  . rOPINIRole (REQUIP) 1 MG tablet TAKE 1/2 TO 1 TABLET BY MOUTH 3 TIMES A DAY AS NEEDED FOR RESTLESS LEGS  . traMADol (ULTRAM) 50 MG tablet Take 1 tablet (50 mg total) by mouth as needed.  . triamcinolone cream (KENALOG) 0.1 % Apply 1 application  topically 4 (four) times daily as needed.   No current facility-administered medications on file prior to visit.      Allergies:  Allergies  Allergen Reactions  . Naprosyn [Naproxen] Other (See Comments)    GI upset     Medical History:  Past Medical History:  Diagnosis Date  . Allergic rhinitis   . ASTHMA   . BICUSPID AORTIC VALVE   . CEREBROVASCULAR DISEASE   . DEGENERATIVE JOINT DISEASE   . Diabetes mellitus   . DJD (degenerative joint disease)   . GERD   . HSV-1 (herpes simplex virus 1) infection   . HYPERLIPIDEMIA   . Hypertension   . Palpitation   . Vitamin D deficiency    Family history- Reviewed and unchanged Social history- Reviewed and unchanged   Review of Systems:  Review of Systems  Constitutional: Negative for malaise/fatigue and weight loss.  HENT: Negative for hearing loss and tinnitus.   Eyes: Negative for blurred vision and double vision.  Respiratory: Negative for cough, shortness of breath and wheezing.   Cardiovascular: Negative for chest pain, palpitations, orthopnea, claudication and leg swelling.  Gastrointestinal: Negative for abdominal pain, blood in stool, constipation, diarrhea, heartburn, melena, nausea and vomiting.  Genitourinary: Negative.   Musculoskeletal: Negative for falls, joint pain, myalgias and neck pain.  Skin: Negative for rash.  Neurological: Negative for dizziness, tingling, sensory change, weakness and headaches.  Endo/Heme/Allergies: Negative for polydipsia.  Psychiatric/Behavioral: Negative.   All other systems reviewed and are negative.   Physical Exam: BP (!) 104/54   Pulse 87   Temp 97.7 F (36.5 C)   Ht 4\' 10"  (1.473 m)   Wt 120 lb (54.4 kg)   SpO2 96%   BMI 25.08 kg/m  Wt Readings from Last 3 Encounters:  09/02/17 120 lb (54.4 kg)  05/31/17 121 lb 9.6 oz (55.2 kg)  02/22/17 123 lb (55.8 kg)   General Appearance: Well nourished, in no apparent distress. Eyes: PERRLA, Right eye gaze permanently  deviated, conjunctiva no swelling or erythema Sinuses: No Frontal/maxillary tenderness ENT/Mouth: Ext aud canals clear, TMs without erythema, bulging. No erythema, swelling, or exudate on post pharynx.  Tonsils not swollen or erythematous. Hearing normal.  Neck: Supple, thyroid normal.  Respiratory: Respiratory effort normal, BS equal bilaterally without rales, rhonchi, wheezing or stridor.  Cardio: RRR with no MRGs. Brisk peripheral pulses without edema.  Abdomen: Soft, + BS.  Non tender, no guarding, rebound, hernias, masses. Lymphatics: Non tender without lymphadenopathy.  Musculoskeletal: Full ROM, 5/5 strength, Normal gait Skin: Warm, dry without rashes, lesions, ecchymosis.  Neuro: Cranial nerves intact. No cerebellar symptoms.  Psych: Awake and oriented X 3, normal affect, Insight and Judgment appropriate.    Izora Ribas, NP 9:24 AM [facility]

## 2017-09-02 ENCOUNTER — Encounter: Payer: Self-pay | Admitting: Adult Health

## 2017-09-02 ENCOUNTER — Ambulatory Visit (INDEPENDENT_AMBULATORY_CARE_PROVIDER_SITE_OTHER): Payer: BLUE CROSS/BLUE SHIELD | Admitting: Adult Health

## 2017-09-02 VITALS — BP 104/54 | HR 87 | Temp 97.7°F | Ht <= 58 in | Wt 120.0 lb

## 2017-09-02 DIAGNOSIS — Z79899 Other long term (current) drug therapy: Secondary | ICD-10-CM

## 2017-09-02 DIAGNOSIS — I10 Essential (primary) hypertension: Secondary | ICD-10-CM

## 2017-09-02 DIAGNOSIS — E1122 Type 2 diabetes mellitus with diabetic chronic kidney disease: Secondary | ICD-10-CM

## 2017-09-02 DIAGNOSIS — E559 Vitamin D deficiency, unspecified: Secondary | ICD-10-CM

## 2017-09-02 DIAGNOSIS — N181 Chronic kidney disease, stage 1: Secondary | ICD-10-CM | POA: Diagnosis not present

## 2017-09-02 DIAGNOSIS — E782 Mixed hyperlipidemia: Secondary | ICD-10-CM | POA: Diagnosis not present

## 2017-09-02 DIAGNOSIS — Z6825 Body mass index (BMI) 25.0-25.9, adult: Secondary | ICD-10-CM

## 2017-09-02 NOTE — Patient Instructions (Addendum)
Aim for 7+ servings of fruits and vegetables daily  80+ fluid ounces of water or unsweet tea for healthy kidneys  Limit alcohol intake, avoid smoking  Limit animal fats in diet for cholesterol and heart health - choose grass fed whenever available  Aim for low stress - take time to unwind and care for your mental health  Aim for 150 min of moderate intensity exercise weekly for heart health, and weights twice weekly for bone health  Aim for 7-9 hours of sleep daily   Monitor your blood pressure at home, please keep a record and bring that in with you to your next office visit.   Go to the ER if any CP, SOB, nausea, dizziness, severe HA, changes vision/speech  Due to a recent study, SPRINT, we have changed our goal for the systolic or top blood pressure number. Ideally we want your top number at 120.  In the [facility] Trial, 5000 people were randomized to a goal BP of 120 and 5000 people were randomized to a goal BP of less than 140. The patients with the goal BP at 120 had LESS DEMENTIA, LESS HEART ATTACKS, AND LESS STROKES, AS WELL AS OVERALL DECREASED MORTALITY OR DEATH RATE.   If you are willing, our goal BP is the top number of 120.  Your most recent BP: BP: (!) 104/54   Take your medications faithfully as instructed. Maintain a healthy weight. Get at least 150 minutes of aerobic exercise per week. Minimize salt intake. Minimize alcohol intake  DASH Eating Plan DASH stands for "Dietary Approaches to Stop Hypertension." The DASH eating plan is a healthy eating plan that has been shown to reduce high blood pressure (hypertension). Additional health benefits may include reducing the risk of type 2 diabetes mellitus, heart disease, and stroke. The DASH eating plan may also help with weight loss. WHAT DO I NEED TO KNOW ABOUT THE DASH EATING PLAN? For the DASH eating plan, you will follow these general guidelines:  Choose foods with a percent daily value for sodium of less than 5% (as  listed on the food label).  Use salt-free seasonings or herbs instead of table salt or sea salt.  Check with your health care provider or pharmacist before using salt substitutes.  Eat lower-sodium products, often labeled as "lower sodium" or "no salt added."  Eat fresh foods.  Eat more vegetables, fruits, and low-fat dairy products.  Choose whole grains. Look for the word "whole" as the first word in the ingredient list.  Choose fish and skinless chicken or [place] more often than red meat. Limit fish, poultry, and meat to 6 oz (170 g) each day.  Limit sweets, desserts, sugars, and sugary drinks.  Choose heart-healthy fats.  Limit cheese to 1 oz (28 g) per day.  Eat more home-cooked food and less restaurant, buffet, and fast food.  Limit fried foods.  Cook foods using methods other than frying.  Limit canned vegetables. If you do use them, rinse them well to decrease the sodium.  When eating at a restaurant, ask that your food be prepared with less salt, or no salt if possible. WHAT FOODS CAN I EAT? Seek help from a dietitian for individual calorie needs. Grains Whole grain or whole wheat bread. Brown rice. Whole grain or whole wheat pasta. Quinoa, bulgur, and whole grain cereals. Low-sodium cereals. Corn or whole wheat flour tortillas. Whole grain cornbread. Whole grain crackers. Low-sodium crackers. Vegetables Fresh or frozen vegetables (raw, steamed, roasted, or grilled). Low-sodium or  reduced-sodium tomato and vegetable juices. Low-sodium or reduced-sodium tomato sauce and paste. Low-sodium or reduced-sodium canned vegetables.  Fruits All fresh, canned (in natural juice), or frozen fruits. Meat and Other Protein Products Ground beef (85% or leaner), grass-fed beef, or beef trimmed of fat. Skinless chicken or [place]. Ground chicken or [place]. Pork trimmed of fat. All fish and seafood. Eggs. Dried beans, peas, or lentils. Unsalted nuts and seeds. Unsalted canned  beans. Dairy Low-fat dairy products, such as skim or 1% milk, 2% or reduced-fat cheeses, low-fat ricotta or cottage cheese, or plain low-fat yogurt. Low-sodium or reduced-sodium cheeses. Fats and Oils Tub margarines without trans fats. Light or reduced-fat mayonnaise and salad dressings (reduced sodium). Avocado. Safflower, olive, or canola oils. Natural peanut or almond butter. Other Unsalted popcorn and pretzels. The items listed above may not be a complete list of recommended foods or beverages. Contact your dietitian for more options. WHAT FOODS ARE NOT RECOMMENDED? Grains White bread. White pasta. White rice. Refined cornbread. Bagels and croissants. Crackers that contain trans fat. Vegetables Creamed or fried vegetables. Vegetables in a cheese sauce. Regular canned vegetables. Regular canned tomato sauce and paste. Regular tomato and vegetable juices. Fruits Dried fruits. Canned fruit in light or heavy syrup. Fruit juice. Meat and Other Protein Products Fatty cuts of meat. Ribs, chicken wings, bacon, sausage, bologna, salami, chitterlings, fatback, hot dogs, bratwurst, and packaged luncheon meats. Salted nuts and seeds. Canned beans with salt. Dairy Whole or 2% milk, cream, half-and-half, and cream cheese. Whole-fat or sweetened yogurt. Full-fat cheeses or blue cheese. Nondairy creamers and whipped toppings. Processed cheese, cheese spreads, or cheese curds. Condiments Onion and garlic salt, seasoned salt, table salt, and sea salt. Canned and packaged gravies. Worcestershire sauce. Tartar sauce. Barbecue sauce. Teriyaki sauce. Soy sauce, including reduced sodium. Steak sauce. Fish sauce. Oyster sauce. Cocktail sauce. Horseradish. Ketchup and mustard. Meat flavorings and tenderizers. Bouillon cubes. Hot sauce. Tabasco sauce. Marinades. Taco seasonings. Relishes. Fats and Oils Butter, stick margarine, lard, shortening, ghee, and bacon fat. Coconut, palm kernel, or palm oils. Regular salad  dressings. Other Pickles and olives. Salted popcorn and pretzels. The items listed above may not be a complete list of foods and beverages to avoid. Contact your dietitian for more information. WHERE CAN I FIND MORE INFORMATION? National [employer], [employer], [employer]: travelstabloid.com Document Released: 01/21/2011 Document Revised: 06/18/2013 Document Reviewed: 12/06/2012 [facility] Patient Information 2015 [place], [place]. This information is not intended to replace advice given to you by your health care provider. Make sure you discuss any questions you have with your health care provider.

## 2017-09-03 LAB — COMPLETE METABOLIC PANEL WITH GFR
AG Ratio: 1.6 (calc) (ref 1.0–2.5)
ALT: 11 U/L (ref 6–29)
AST: 12 U/L (ref 10–35)
Albumin: 4.3 g/dL (ref 3.6–5.1)
Alkaline phosphatase (APISO): 68 U/L (ref 33–130)
BUN: 23 mg/dL (ref 7–25)
CO2: 31 mmol/L (ref 20–32)
Calcium: 10 mg/dL (ref 8.6–10.4)
Chloride: 100 mmol/L (ref 98–110)
Creat: 0.87 mg/dL (ref 0.50–0.99)
GFR, Est African American: 83 mL/min/{1.73_m2} (ref >=60)
GFR, Est Non African American: 72 mL/min/{1.73_m2} (ref >=60)
Globulin: 2.7 g/dL (calc) (ref 1.9–3.7)
Glucose, Bld: 223 mg/dL — ABNORMAL HIGH (ref 65–99)
Potassium: 5 mmol/L (ref 3.5–5.3)
Sodium: 142 mmol/L (ref 135–146)
Total Bilirubin: 0.3 mg/dL (ref 0.2–1.2)
Total Protein: 7 g/dL (ref 6.1–8.1)

## 2017-09-03 LAB — CBC WITH DIFFERENTIAL/PLATELET
Basophils Absolute: 47 cells/uL (ref 0–200)
Basophils Relative: 0.6 %
Eosinophils Absolute: 63 cells/uL (ref 15–500)
Eosinophils Relative: 0.8 %
HCT: 44.6 % (ref 35.0–45.0)
Hemoglobin: 14.3 g/dL (ref 11.7–15.5)
Lymphs Abs: 2117 cells/uL (ref 850–3900)
MCH: 31.4 pg (ref 27.0–33.0)
MCHC: 32.1 g/dL (ref 32.0–36.0)
MCV: 97.8 fL (ref 80.0–100.0)
MPV: 11.5 fL (ref 7.5–12.5)
Monocytes Relative: 6.4 %
Neutro Abs: 5167 cells/uL (ref 1500–7800)
Neutrophils Relative %: 65.4 %
Platelets: 255 10*3/uL (ref 140–400)
RBC: 4.56 10*6/uL (ref 3.80–5.10)
RDW: 12.4 % (ref 11.0–15.0)
Total Lymphocyte: 26.8 %
WBC mixed population: 506 cells/uL (ref 200–950)
WBC: 7.9 10*3/uL (ref 3.8–10.8)

## 2017-09-03 LAB — LIPID PANEL
Cholesterol: 156 mg/dL (ref <200)
HDL: 51 mg/dL (ref >50)
LDL Cholesterol (Calc): 84 mg/dL (calc)
Non-HDL Cholesterol (Calc): 105 mg/dL (calc) (ref <130)
Total CHOL/HDL Ratio: 3.1 (calc) (ref <5.0)
Triglycerides: 115 mg/dL (ref <150)

## 2017-09-03 LAB — HEMOGLOBIN A1C
Hgb A1c MFr Bld: 6.7 % of total Hgb — ABNORMAL HIGH (ref <5.7)
Mean Plasma Glucose: 146 (calc)
eAG (mmol/L): 8.1 (calc)

## 2017-09-03 LAB — TSH: TSH: 1.12 mIU/L (ref 0.40–4.50)

## 2017-09-03 LAB — MAGNESIUM: Magnesium: 1.8 mg/dL (ref 1.5–2.5)

## 2017-09-12 ENCOUNTER — Other Ambulatory Visit: Payer: Self-pay | Admitting: Adult Health

## 2017-09-27 ENCOUNTER — Encounter: Payer: Self-pay | Admitting: *Deleted

## 2017-10-10 ENCOUNTER — Other Ambulatory Visit: Payer: Self-pay

## 2017-10-10 MED ORDER — POTASSIUM CHLORIDE CRYS ER 20 MEQ PO TBCR: 20.0000 meq | EXTENDED_RELEASE_TABLET | Freq: Three times a day (TID) | ORAL | 1 refills | Status: DC

## 2017-12-06 ENCOUNTER — Other Ambulatory Visit: Payer: Self-pay | Admitting: Internal Medicine

## 2017-12-06 DIAGNOSIS — Z1231 Encounter for screening mammogram for malignant neoplasm of breast: Secondary | ICD-10-CM

## 2017-12-11 ENCOUNTER — Encounter: Payer: Self-pay | Admitting: Internal Medicine

## 2017-12-11 NOTE — Progress Notes (Signed)
[name] [name], M.D.     [name]. [name], P.A.-C [address], [place], [place] [address] Telephone [phone] Telefax [phone] Annual Screening/Preventative Visit & Comprehensive Evaluation &  Examination     This very nice 62 y.o. MBF presents for a Screening /Preventative Visit & comprehensive evaluation and management of multiple medical co-morbidities.  Patient has been followed for HTN, HLD, T2_NIDDM and Vitamin D Deficiency.      HTN predates since 1996. Patient's BP has been controlled at home and patient denies any cardiac symptoms as chest pain, palpitations, shortness of breath, dizziness or ankle swelling. Today's BP is at goal - 104/66.      Patient's hyperlipidemia is controlled with diet and medications. Patient denies myalgias or other medication SE's. Last lipids were at goal: Lab Results  Component Value Date   CHOL 156 09/02/2017   HDL 51 09/02/2017   LDLCALC 84 09/02/2017   TRIG 115 09/02/2017   CHOLHDL 3.1 09/02/2017      Patient has hx/o T2_NIDDM (1996) and patient denies reactive hypoglycemic symptoms, visual blurring, diabetic polys or paresthesias. Patient is on Metformin and last A1c was not at goal: Lab Results  Component Value Date   HGBA1C 6.7 (H) 09/02/2017    Wt Readings from Last 3 Encounters:  12/12/17 117 lb (53.1 kg)  09/02/17 120 lb (54.4 kg)  05/31/17 121 lb 9.6 oz (55.2 kg)       Finally, patient has history of Vitamin D Deficiency and last Vitamin D was at goal: Lab Results  Component Value Date   VD25OH 92 05/31/2017   Current Outpatient Medications on File Prior to Visit  Medication Sig  . acyclovir (ZOVIRAX) 400 MG tablet Take 1 pill 3 times daily with food and 2 pills at night  . albuterol (PROVENTIL HFA;VENTOLIN HFA) 108 (90 Base) MCG/ACT inhaler Use 1  To 2 inhalations 5 minutes apart every 4 hours if needed to  rescue asthma  . aspirin EC 81 MG tablet Take 81 mg by mouth daily.  . B Complex-C (SUPER B COMPLEX PO) Take 1 tablet by mouth daily. Takes every other day  . BAYER MICROLET LANCETS lancets Check blood sugar 1 time daily-DX-E11.9.  [name] canagliflozin (INVOKANA) 300 MG TABS tablet TAKE 1 TABLET BY MOUTH EVERY DAY FOR DIABETES  . Cholecalciferol (VITAMIN D PO) Take 1 capsule by mouth daily. Take 5000 units by mouth once a day  . Cinnamon 500 MG capsule Take 1,000 mg by mouth daily.   . Diclofenac Sodium 1.5 % SOLN Place a small amount of gel on the bilateral hands for hand arthritis  . fluconazole (DIFLUCAN) 150 MG tablet Use once for yeast infection as needed  . furosemide (LASIX) 20 MG tablet Take 1 tablet (20 mg total) by mouth daily.  [name] gabapentin (NEURONTIN) 100 MG capsule TAKE 1-3 PILLS AT NIGHT (Patient taking differently: Take 300mg  tablet by mouth once a day)  . glucose blood (BAYER CONTOUR NEXT TEST) test strip Check blood sugar 1 time daily-DX-E11.9.  . lisinopril-hydrochlorothiazide (PRINZIDE,ZESTORETIC) 10-12.5 MG tablet Take 1 tablet by mouth every morning.  [name] MAGNESIUM PO Take 2 tablets by mouth daily.  . metFORMIN (GLUCOPHAGE-XR) 500 MG 24 hr tablet Take 2 tablets 2 x /day with meals for Diabetes  . metoprolol succinate (TOPROL-XL) 25 MG 24 hr tablet TAKE 1 TABLET BY MOUTH DAILY  . mometasone (NASONEX) 50 MCG/ACT nasal spray Place 2 sprays into  the nose daily.  . Multiple Vitamin (MULITIVITAMIN WITH MINERALS) TABS Take 1 tablet by mouth daily.  [name] OVER THE COUNTER MEDICATION Take Movefree vitamin 1/2 tablet daily.  [name] OVER THE COUNTER MEDICATION Takes Omega-Q vitamin which contains Tumeric.  . phentermine (ADIPEX-P) 37.5 MG tablet Take 1/2 to 1 tablet every morning for dieting & weightloss  . potassium chloride SA (KLOR-CON M20) 20 MEQ tablet Take 1 tablet (20 mEq total) by mouth 3 (three) times daily.  . pravastatin (PRAVACHOL) 40 MG tablet Take 1/2 to 1 tablet daily or as directed   . Psyllium (METAMUCIL PO) Take 1 Dose by mouth daily.  [name] rOPINIRole (REQUIP) 1 MG tablet TAKE 1/2 TO 1 TABLET BY MOUTH 3 TIMES A DAY AS NEEDED FOR RESTLESS LEGS  . triamcinolone cream (KENALOG) 0.1 % Apply 1 application topically 4 (four) times daily as needed.   No current facility-administered medications on file prior to visit.    Allergies  Allergen Reactions  . Naprosyn [Naproxen] Other (See Comments)    GI upset   Past Medical History:  Diagnosis Date  . Allergic rhinitis   . ASTHMA   . BICUSPID AORTIC VALVE   . CEREBROVASCULAR DISEASE   . DEGENERATIVE JOINT DISEASE   . Diabetes mellitus   . DJD (degenerative joint disease)   . GERD   . HSV-1 (herpes simplex virus 1) infection   . HYPERLIPIDEMIA   . Hypertension   . Palpitation   . Vitamin D deficiency    Health Maintenance  Topic Date Due  . PAP SMEAR  07/27/2016  . HEMOGLOBIN A1C  03/05/2018  . OPHTHALMOLOGY EXAM  07/09/2018  . FOOT EXAM  12/12/2018  . MAMMOGRAM  12/25/2018  . COLONOSCOPY  01/27/2025  . INFLUENZA VACCINE  Completed  . PNEUMOCOCCAL POLYSACCHARIDE VACCINE AGE 74-64 HIGH RISK  Completed  . Hepatitis C Screening  Completed  . HIV Screening  Completed   Immunization History  Administered Date(s) Administered  . DT 10/11/2014  . DTaP 03/18/2004  . Influenza Inj Mdck Quad With Preservative 11/17/2016  . Influenza Split 11/15/2013, 11/30/2014  . Influenza Whole 11/02/2012  . Influenza-Unspecified 10/31/2015, 10/21/2017  . PPD Test 07/30/2013, 10/11/2014, 11/06/2015, 11/17/2016  . Pneumococcal Conjugate-13 11/15/2013, 11/17/2016  . Pneumococcal Polysaccharide-23 05/16/1996   Last Colon - 01/28/2015 - Dr Earlean Shawl - Recc 10 yr f/u Dec 2026. Last MGM - 12/24/2016  Past Surgical History:  Procedure Laterality Date  . ABDOMINAL HYSTERECTOMY    . repair of trigger finger     Family History  Problem Relation Age of Onset  . Coronary artery disease Mother   . Heart disease Mother   . Hypertension  Mother   . Diabetes Father   . Hypertension Father    Social History   Tobacco Use  . Smoking status: Never Smoker  . Smokeless tobacco: Never Used  Substance Use Topics  . Alcohol use: Yes    Comment: very rarely  . Drug use: No    ROS Constitutional: Denies fever, chills, weight loss/gain, headaches, insomnia,  night sweats, and change in appetite. Does c/o fatigue. Eyes: Denies redness, blurred vision, diplopia, discharge, itchy, watery eyes.  ENT: Denies discharge, congestion, post nasal drip, epistaxis, sore throat, earache, hearing loss, dental pain, Tinnitus, Vertigo, Sinus pain, snoring.  Cardio: Denies chest pain, palpitations, irregular heartbeat, syncope, dyspnea, diaphoresis, orthopnea, PND, claudication, edema Respiratory: denies cough, dyspnea, DOE, pleurisy, hoarseness, laryngitis, wheezing.  Gastrointestinal: Denies dysphagia, heartburn, reflux, water brash, pain, cramps, nausea, vomiting, bloating, diarrhea,  constipation, hematemesis, melena, hematochezia, jaundice, hemorrhoids Genitourinary: Denies dysuria, frequency, urgency, nocturia, hesitancy, discharge, hematuria, flank pain Breast: Breast lumps, nipple discharge, bleeding.  Musculoskeletal: Denies arthralgia, myalgia, stiffness, Jt. Swelling, pain, limp, and strain/sprain. Denies falls. Skin: Denies puritis, rash, hives, warts, acne, eczema, changing in skin lesion Neuro: No weakness, tremor, incoordination, spasms, paresthesia, pain Psychiatric: Denies confusion, memory loss, sensory loss. Denies Depression. Endocrine: Denies change in weight, skin, hair change, nocturia, and paresthesia, diabetic polys, visual blurring, hyper / hypo glycemic episodes.  Heme/Lymph: No excessive bleeding, bruising, enlarged lymph nodes.  Physical Exam  BP 104/66   Pulse 80   Temp (!) 97.5 F (36.4 C)   Resp 16   Ht 4' 9.5" (1.461 m)   Wt 117 lb (53.1 kg)   BMI 24.88 kg/m   General Appearance: Well nourished, well  groomed and in no apparent distress.  Eyes: PERRLA, EOMs, conjunctiva no swelling or erythema, normal fundi and vessels. Sinuses: No frontal/maxillary tenderness ENT/Mouth: EACs patent / TMs  nl. Nares clear without erythema, swelling, mucoid exudates. Oral hygiene is good. No erythema, swelling, or exudate. Tongue normal, non-obstructing. Tonsils not swollen or erythematous. Hearing normal.  Neck: Supple, thyroid not palpable. No bruits, nodes or JVD. Respiratory: Respiratory effort normal.  BS equal and clear bilateral without rales, rhonci, wheezing or stridor. Cardio: Heart sounds are normal with regular rate and rhythm and no murmurs, rubs or gallops. Peripheral pulses are normal and equal bilaterally without edema. No aortic or femoral bruits. Chest: symmetric with normal excursions and percussion. Breasts: Symmetric, without lumps, nipple discharge, retractions, or fibrocystic changes.  Abdomen: Flat, soft with bowel sounds active. Nontender, no guarding, rebound, hernias, masses, or organomegaly.  Lymphatics: Non tender without lymphadenopathy.  Genitourinary:  Musculoskeletal: Full ROM all peripheral extremities, joint stability, 5/5 strength, and normal gait. Skin: Warm and dry without rashes, lesions, cyanosis, clubbing or  ecchymosis.  Neuro: Cranial nerves intact, reflexes equal bilaterally. Normal muscle tone, no cerebellar symptoms. Sensation intact.  Pysch: Alert and oriented X 3, normal affect, Insight and Judgment appropriate.   Assessment and Plan  1. Annual Preventative Screening Examination  2. Essential hypertension  - EKG 12-Lead - [place], RETROPERITNL ABD,  LTD - Urinalysis, Routine w reflex microscopic - Microalbumin / creatinine urine ratio - CBC with Differential/Platelet - COMPLETE METABOLIC PANEL WITH GFR - Magnesium - TSH  3. Hyperlipidemia, mixed  - EKG 12-Lead - [place], RETROPERITNL ABD,  LTD - Lipid panel - TSH  4. Type 2 diabetes mellitus with stage 1  chronic kidney disease, without long-term current use of insulin (HCC)  - EKG 12-Lead - [place], RETROPERITNL ABD,  LTD - Urinalysis, Routine w reflex microscopic - Microalbumin / creatinine urine ratio - Hemoglobin A1c - Insulin, random  5. Vitamin D deficiency  - VITAMIN D 25 Hydroxy   6. Gastroesophageal reflux disease  - CBC with Differential/Platelet  7. Type 2 diabetes mellitus with diabetic neuropathy, without long-term current use of insulin (HCC)  - HM DIABETES FOOT EXAM - LOW EXTREMITY NEUR EXAM DOCUM - Hemoglobin A1c - Insulin, random  8. BICUSPID AORTIC VALVE  9. Screening for rectal cancer  - POC Hemoccult Bld/Stl  10. Screening for ischemic heart disease  - EKG 12-Lead - Lipid panel  11. FHx: heart disease  - EKG 12-Lead - [place], RETROPERITNL ABD,  LTD  12. Screening for AAA (aortic abdominal aneurysm)  - [place], RETROPERITNL ABD,  LTD  13. Fatigue, unspecified type  - Iron,Total/Total Iron Binding Cap - Vitamin  B12 - CBC with Differential/Platelet  14. Medication management  - Urinalysis, Routine w reflex microscopic - Microalbumin / creatinine urine ratio - CBC with Differential/Platelet - COMPLETE METABOLIC PANEL WITH GFR - Magnesium - Lipid panel - TSH - Hemoglobin A1c - Insulin, random - VITAMIN D 25 Hydroxyl        Patient was counseled in prudent diet to achieve/maintain BMI less than 25 for weight control, BP monitoring, regular exercise and medications. Discussed med's effects and SE's. Screening labs and tests as requested with regular follow-up as recommended. Over 40 minutes of exam, counseling, chart review and high complex critical decision making was performed.

## 2017-12-11 NOTE — Patient Instructions (Signed)

## 2017-12-12 ENCOUNTER — Ambulatory Visit: Payer: BLUE CROSS/BLUE SHIELD | Admitting: Internal Medicine

## 2017-12-12 VITALS — BP 104/66 | HR 80 | Temp 97.5°F | Resp 16 | Ht <= 58 in | Wt 117.0 lb

## 2017-12-12 DIAGNOSIS — G8929 Other chronic pain: Secondary | ICD-10-CM

## 2017-12-12 DIAGNOSIS — Z0000 Encounter for general adult medical examination without abnormal findings: Secondary | ICD-10-CM

## 2017-12-12 DIAGNOSIS — Z136 Encounter for screening for cardiovascular disorders: Secondary | ICD-10-CM

## 2017-12-12 DIAGNOSIS — Z79899 Other long term (current) drug therapy: Secondary | ICD-10-CM

## 2017-12-12 DIAGNOSIS — M545 Low back pain, unspecified: Secondary | ICD-10-CM

## 2017-12-12 DIAGNOSIS — Z0001 Encounter for general adult medical examination with abnormal findings: Secondary | ICD-10-CM

## 2017-12-12 DIAGNOSIS — Z131 Encounter for screening for diabetes mellitus: Secondary | ICD-10-CM

## 2017-12-12 DIAGNOSIS — E782 Mixed hyperlipidemia: Secondary | ICD-10-CM

## 2017-12-12 DIAGNOSIS — Z8249 Family history of ischemic heart disease and other diseases of the circulatory system: Secondary | ICD-10-CM

## 2017-12-12 DIAGNOSIS — N181 Chronic kidney disease, stage 1: Secondary | ICD-10-CM

## 2017-12-12 DIAGNOSIS — I10 Essential (primary) hypertension: Secondary | ICD-10-CM

## 2017-12-12 DIAGNOSIS — Z1329 Encounter for screening for other suspected endocrine disorder: Secondary | ICD-10-CM | POA: Diagnosis not present

## 2017-12-12 DIAGNOSIS — Z111 Encounter for screening for respiratory tuberculosis: Secondary | ICD-10-CM

## 2017-12-12 DIAGNOSIS — Z1212 Encounter for screening for malignant neoplasm of rectum: Secondary | ICD-10-CM

## 2017-12-12 DIAGNOSIS — E559 Vitamin D deficiency, unspecified: Secondary | ICD-10-CM

## 2017-12-12 DIAGNOSIS — R5383 Other fatigue: Secondary | ICD-10-CM

## 2017-12-12 DIAGNOSIS — K219 Gastro-esophageal reflux disease without esophagitis: Secondary | ICD-10-CM

## 2017-12-12 DIAGNOSIS — Z1389 Encounter for screening for other disorder: Secondary | ICD-10-CM

## 2017-12-12 DIAGNOSIS — Z13220 Encounter for screening for lipoid disorders: Secondary | ICD-10-CM

## 2017-12-12 DIAGNOSIS — E1140 Type 2 diabetes mellitus with diabetic neuropathy, unspecified: Secondary | ICD-10-CM

## 2017-12-12 DIAGNOSIS — E1122 Type 2 diabetes mellitus with diabetic chronic kidney disease: Secondary | ICD-10-CM

## 2017-12-12 DIAGNOSIS — Z130 Encounter for screening for diseases of the blood and blood-forming organs and certain disorders involving the immune mechanism: Secondary | ICD-10-CM | POA: Diagnosis not present

## 2017-12-12 MED ORDER — MELOXICAM 15 MG PO TABS: ORAL_TABLET | ORAL | 1 refills | Status: DC

## 2017-12-12 MED ORDER — FAMOTIDINE 40 MG PO TABS: ORAL_TABLET | ORAL | 3 refills | Status: DC

## 2017-12-12 NOTE — Addendum Note (Signed)
Addended by: Melbourne Abts C on: 12/12/2017 10:47 AM   Modules accepted: Orders

## 2017-12-13 ENCOUNTER — Other Ambulatory Visit: Payer: Self-pay | Admitting: Internal Medicine

## 2017-12-13 LAB — CBC WITH DIFFERENTIAL/PLATELET
Basophils Absolute: 37 cells/uL (ref 0–200)
Basophils Relative: 0.5 %
Eosinophils Absolute: 81 cells/uL (ref 15–500)
Eosinophils Relative: 1.1 %
HCT: 47.7 % — ABNORMAL HIGH (ref 35.0–45.0)
Hemoglobin: 15.5 g/dL (ref 11.7–15.5)
Lymphs Abs: 2079 cells/uL (ref 850–3900)
MCH: 31 pg (ref 27.0–33.0)
MCHC: 32.5 g/dL (ref 32.0–36.0)
MCV: 95.4 fL (ref 80.0–100.0)
MPV: 11.2 fL (ref 7.5–12.5)
Monocytes Relative: 6.4 %
Neutro Abs: 4729 cells/uL (ref 1500–7800)
Neutrophils Relative %: 63.9 %
Platelets: 312 10*3/uL (ref 140–400)
RBC: 5 10*6/uL (ref 3.80–5.10)
RDW: 11.8 % (ref 11.0–15.0)
Total Lymphocyte: 28.1 %
WBC mixed population: 474 cells/uL (ref 200–950)
WBC: 7.4 10*3/uL (ref 3.8–10.8)

## 2017-12-13 LAB — URINALYSIS, ROUTINE W REFLEX MICROSCOPIC
Bilirubin Urine: NEGATIVE
Hgb urine dipstick: NEGATIVE
Ketones, ur: NEGATIVE
Leukocytes, UA: NEGATIVE
Nitrite: NEGATIVE
Protein, ur: NEGATIVE
Specific Gravity, Urine: 1.021 (ref 1.001–1.03)
pH: 6 (ref 5.0–8.0)

## 2017-12-13 LAB — COMPLETE METABOLIC PANEL WITH GFR
AG Ratio: 1.7 (calc) (ref 1.0–2.5)
ALT: 17 U/L (ref 6–29)
AST: 16 U/L (ref 10–35)
Albumin: 4.7 g/dL (ref 3.6–5.1)
Alkaline phosphatase (APISO): 69 U/L (ref 33–130)
BUN: 12 mg/dL (ref 7–25)
CO2: 34 mmol/L — ABNORMAL HIGH (ref 20–32)
Calcium: 10.4 mg/dL (ref 8.6–10.4)
Chloride: 99 mmol/L (ref 98–110)
Creat: 0.56 mg/dL (ref 0.50–0.99)
GFR, Est African American: 116 mL/min/{1.73_m2} (ref >=60)
GFR, Est Non African American: 100 mL/min/{1.73_m2} (ref >=60)
Globulin: 2.8 g/dL (calc) (ref 1.9–3.7)
Glucose, Bld: 91 mg/dL (ref 65–99)
Potassium: 4.4 mmol/L (ref 3.5–5.3)
Sodium: 141 mmol/L (ref 135–146)
Total Bilirubin: 0.3 mg/dL (ref 0.2–1.2)
Total Protein: 7.5 g/dL (ref 6.1–8.1)

## 2017-12-13 LAB — LIPID PANEL
Cholesterol: 180 mg/dL (ref <200)
HDL: 58 mg/dL (ref >50)
LDL Cholesterol (Calc): 108 mg/dL (calc) — ABNORMAL HIGH
Non-HDL Cholesterol (Calc): 122 mg/dL (calc) (ref <130)
Total CHOL/HDL Ratio: 3.1 (calc) (ref <5.0)
Triglycerides: 58 mg/dL (ref <150)

## 2017-12-13 LAB — VITAMIN B12: Vitamin B-12: 1165 pg/mL — ABNORMAL HIGH (ref 200–1100)

## 2017-12-13 LAB — MICROALBUMIN / CREATININE URINE RATIO
Creatinine, Urine: 98 mg/dL (ref 20–275)
Microalb Creat Ratio: 5 mcg/mg creat (ref <30)
Microalb, Ur: 0.5 mg/dL

## 2017-12-13 LAB — IRON, TOTAL/TOTAL IRON BINDING CAP
%SAT: 12 % (calc) — ABNORMAL LOW (ref 16–45)
Iron: 38 ug/dL — ABNORMAL LOW (ref 45–160)
TIBC: 326 mcg/dL (calc) (ref 250–450)

## 2017-12-13 LAB — MAGNESIUM: Magnesium: 2 mg/dL (ref 1.5–2.5)

## 2017-12-13 LAB — TSH: TSH: 0.8 mIU/L (ref 0.40–4.50)

## 2017-12-13 LAB — INSULIN, RANDOM: Insulin: 4.5 u[IU]/mL (ref 2.0–19.6)

## 2017-12-13 LAB — HEMOGLOBIN A1C
Hgb A1c MFr Bld: 6.7 % of total Hgb — ABNORMAL HIGH (ref <5.7)
Mean Plasma Glucose: 146 (calc)
eAG (mmol/L): 8.1 (calc)

## 2017-12-13 LAB — VITAMIN D 25 HYDROXY (VIT D DEFICIENCY, FRACTURES): Vit D, 25-Hydroxy: 83 ng/mL (ref 30–100)

## 2017-12-13 MED ORDER — ROSUVASTATIN CALCIUM 40 MG PO TABS: ORAL_TABLET | ORAL | 3 refills | Status: DC

## 2017-12-15 LAB — TB SKIN TEST
Induration: 0 mm
TB Skin Test: NEGATIVE

## 2018-01-03 DIAGNOSIS — H1013 Acute atopic conjunctivitis, bilateral: Secondary | ICD-10-CM | POA: Diagnosis not present

## 2018-01-11 ENCOUNTER — Ambulatory Visit
Admission: RE | Admit: 2018-01-11 | Discharge: 2018-01-11 | Disposition: A | Discharge status: Home / Self Care | Payer: BLUE CROSS/BLUE SHIELD | Source: Ambulatory Visit | Attending: Internal Medicine | Admitting: Internal Medicine

## 2018-01-11 DIAGNOSIS — Z1231 Encounter for screening mammogram for malignant neoplasm of breast: Secondary | ICD-10-CM

## 2018-01-14 ENCOUNTER — Other Ambulatory Visit: Payer: Self-pay | Admitting: Internal Medicine

## 2018-01-31 DIAGNOSIS — H16293 Other keratoconjunctivitis, bilateral: Secondary | ICD-10-CM | POA: Diagnosis not present

## 2018-02-27 DIAGNOSIS — H16291 Other keratoconjunctivitis, right eye: Secondary | ICD-10-CM | POA: Diagnosis not present

## 2018-03-16 ENCOUNTER — Other Ambulatory Visit: Payer: Self-pay | Admitting: Internal Medicine

## 2018-03-16 MED ORDER — LISINOPRIL-HYDROCHLOROTHIAZIDE 10-12.5 MG PO TABS: ORAL_TABLET | ORAL | 1 refills | Status: DC

## 2018-03-16 MED ORDER — PHENTERMINE HCL 37.5 MG PO TABS: ORAL_TABLET | ORAL | 1 refills | Status: DC

## 2018-03-23 NOTE — Progress Notes (Signed)
FOLLOW UP  Assessment and Plan:   Hypertension Well controlled with current medications  Monitor blood pressure at home; patient to call if consistently greater than 130/80 Continue DASH diet.   Reminder to go to the ER if any CP, SOB, nausea, dizziness, severe HA, changes vision/speech, left arm numbness and tingling and jaw pain.  Cholesterol Currently above goal of LDL <70; STOP pravastatin, start taking rosuvastatin on ly  Continue low cholesterol diet and exercise.  Check lipid panel.   Diabetes with diabetic chronic kidney disease Continue medication: metformin Discussed limit quantity of fruit, eat with peanut butter or some cheese Continue diet and exercise.  Perform daily foot/skin check, notify office of any concerning changes.  Check A1C  Vitamin D Def At goal at last visit; continue supplementation to maintain goal of 70-100 Defer Vit D level  BMI 25 Continue to recommend diet heavy in fruits and veggies and low in animal meats, cheeses, and dairy products, appropriate calorie intake Discuss exercise recommendations routinely Continue to monitor weight at each visit  Insomnia/somnolensce due to shift work Advised to limit phentermine - not appropriate use of medication Discussed sleep hygiene - get black out curtains/eye mask, limit sounds in house, wear earplugs, try to go to sleep and wake up at consistent times, avoid getting up to eat during sleeping time Try 5-15 mg melatonin 30 min prior to sleep, continue gabapentin 100-300 mg at HS, try adding trazodone 1/3-1 tab at same time If still having difficulty sleeping and daytime somnolence will refer to neurology  Continue diet and meds as discussed. Further disposition pending results of labs. Discussed med's effects and SE's.   Over 30 minutes of exam, counseling, chart review, and critical decision making was performed.   Future Appointments  Date Time Provider Gerty  06/30/2018  9:30 AM  [name], MD [facility] None  12/26/2018 10:00 AM [name], MD [facility] None    ----------------------------------------------------------------------------------------------------------------------  HPI 63 y.o. female  presents for 3 month follow up on hypertension, cholesterol, diabetes, weight and vitamin D deficiency. She is followed by Dr. Stanford Breed for cerebrovascular disease with most recent dopplers in 2017 showing mild disease only, with recommendation to continue ASA and statin.  GERD is controlled on famotidine.   she is prescribed phentermine for weight loss and for somnolence due to shift work, she reports taking 1/2 tab prior to work 5 days week, doesn't take on weekends. While on the medication they have lost 0 lbs since last visit. They deny palpitations, anxiety, trouble sleeping, elevated BP.   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 She may cook when she gets home, or may go straight to sleep. She does not have black out curtains. She admits her son makes lots of noise in the house during the day. She does not wear earplugs. She has not tried melatonin, taking gabapentin 200 mg at HS. She did try trazodone but only took 25 mg.    BMI is Body mass index is 25.82 kg/m., she is working on diet and exercise. Wt Readings from Last 3 Encounters:  03/24/18 121 lb 6.4 oz (55.1 kg)  12/12/17 117 lb (53.1 kg)  09/02/17 120 lb (54.4 kg)   She has not been checking her BP at home, today their BP is BP: 98/60  She does not workout. She denies chest pain, shortness of breath, dizziness.  She is on cholesterol medication (was on pravastatin but was transitioned to rosuvastatin - she admits she mixed the leftover pravastatin with new script and is taking whichever falls out) and denies myalgias. Her cholesterol is not at goal. The cholesterol last visit  was:   Lab Results  Component Value Date   CHOL 180 12/12/2017   HDL 58 12/12/2017   LDLCALC 108 (H) 12/12/2017   TRIG 58 12/12/2017   CHOLHDL 3.1 12/12/2017    She has not been working on diet and exercise for T2 diabetes (on metformin, was on invokana but stopped since last OV, cinnamon), she is on ACEi, Statin, ASA and denies increased appetite, nausea, paresthesia of the feet, polydipsia, polyuria, visual disturbances and vomiting. She reports she has not been checking her sugars despite being advised to do so. She denies any symptoms of hypoglycemia. Last A1C in the office was:  Lab Results  Component Value Date   HGBA1C 6.7 (H) 12/12/2017   Patient is on Vitamin D supplement and above goal at the last check:    Lab Results  Component Value Date   VD25OH 83 12/12/2017        Current Medications:  Current Outpatient Medications on File Prior to Visit  Medication Sig  . acyclovir (ZOVIRAX) 400 MG tablet Take 1 pill 3 times daily with food and 2 pills at night (Patient taking differently: as needed. Take 1 pill 3 times daily with food and 2 pills at night)  . albuterol (PROVENTIL HFA;VENTOLIN HFA) 108 (90 Base) MCG/ACT inhaler Use 1  To 2 inhalations 5 minutes apart every 4 hours if needed to rescue asthma  . aspirin EC 81 MG tablet Take 81 mg by mouth daily.  . B Complex-C (SUPER B COMPLEX PO) Take 1 tablet by mouth daily. Takes every other day  . BAYER MICROLET LANCETS lancets Check blood sugar 1 time daily-DX-E11.9.  [name] Cholecalciferol (VITAMIN D PO) Take 1 capsule by mouth daily. Take 5000 units by mouth once a day  . Cinnamon 500 MG capsule Take 1,000 mg by mouth daily.   . Diclofenac Sodium 1.5 % SOLN Place a small amount of gel on the bilateral hands for hand arthritis  . famotidine (PEPCID) 40 MG tablet Take 1 tablet daily for Acid Indigestion & Heartburn  . fluconazole (DIFLUCAN) 150 MG tablet Use once for yeast infection as needed  . furosemide (LASIX) 20 MG tablet Take  1 tablet (20 mg total) by mouth daily.  [name] gabapentin (NEURONTIN) 100 MG capsule TAKE 1-3 PILLS AT NIGHT (Patient taking differently: Take 300mg  tablet by mouth once a day)  . glucose blood (BAYER CONTOUR NEXT TEST) test strip Check blood sugar 1 time daily-DX-E11.9.  . lisinopril-hydrochlorothiazide (PRINZIDE,ZESTORETIC) 10-12.5 MG tablet Take 1 tablet every morning for BP  . MAGNESIUM PO Take 2 tablets by mouth daily.  . meloxicam (MOBIC) 15 MG tablet Take 1/2 to 1 tablet daily with food for pain &  Inflammation - limit to maximum of 5 days /week (Patient taking differently: as needed. Take 1/2 to 1 tablet daily with food for pain &  Inflammation - limit to maximum of 5 days /week)  . metFORMIN (GLUCOPHAGE-XR) 500 MG 24 hr tablet Take 2 tablets 2 x /day with meals for Diabetes  . metoprolol succinate (TOPROL-XL) 25 MG 24 hr tablet TAKE 1 TABLET BY MOUTH DAILY  . mometasone (NASONEX) 50 MCG/ACT nasal spray Place 2 sprays into the nose daily. (Patient taking differently: Place 2 sprays into the  nose as needed. )  . Multiple Vitamin (MULITIVITAMIN WITH MINERALS) TABS Take 1 tablet by mouth daily.  [name] OVER THE COUNTER MEDICATION Take Movefree vitamin 1/2 tablet daily.  [name] OVER THE COUNTER MEDICATION Takes Omega-Q vitamin which contains Tumeric.  . phentermine (ADIPEX-P) 37.5 MG tablet Take 1/2 to 1 tablet every morning for dieting & weightloss  . potassium chloride SA (KLOR-CON M20) 20 MEQ tablet Take 1 tablet (20 mEq total) by mouth 3 (three) times daily.  . Psyllium (METAMUCIL PO) Take 1 Dose by mouth daily.  [name] rOPINIRole (REQUIP) 1 MG tablet TAKE 1/2 TO 1 TABLET BY MOUTH 3 TIMES A DAY AS NEEDED FOR RESTLESS LEGS  . rosuvastatin (CRESTOR) 40 MG tablet Take 1/2 to 1 tablet daily or as directed for Cholesterol  . triamcinolone cream (KENALOG) 0.1 % Apply 1 application topically 4 (four) times daily as needed.   No current facility-administered medications on file prior to visit.      Allergies:   Allergies  Allergen Reactions  . Naprosyn [Naproxen] Other (See Comments)    GI upset     Medical History:  Past Medical History:  Diagnosis Date  . Allergic rhinitis   . ASTHMA   . BICUSPID AORTIC VALVE   . CEREBROVASCULAR DISEASE   . DEGENERATIVE JOINT DISEASE   . Diabetes mellitus   . DJD (degenerative joint disease)   . GERD   . HSV-1 (herpes simplex virus 1) infection   . HYPERLIPIDEMIA   . Hypertension   . Palpitation   . Vitamin D deficiency    Family history- Reviewed and unchanged Social history- Reviewed and unchanged   Review of Systems:  Review of Systems  Constitutional: Positive for malaise/fatigue (daytime fatigue/somnolensce ). Negative for weight loss.  HENT: Negative for hearing loss and tinnitus.   Eyes: Negative for blurred vision and double vision.  Respiratory: Negative for cough, shortness of breath and wheezing.   Cardiovascular: Negative for chest pain, palpitations, orthopnea, claudication and leg swelling.  Gastrointestinal: Negative for abdominal pain, blood in stool, constipation, diarrhea, heartburn, melena, nausea and vomiting.  Genitourinary: Negative.   Musculoskeletal: Negative for falls, joint pain, myalgias and neck pain.  Skin: Negative for rash.  Neurological: Negative for dizziness, tingling, sensory change, weakness and headaches.  Endo/Heme/Allergies: Negative for polydipsia.  Psychiatric/Behavioral: Negative for depression and substance abuse. The patient has insomnia. The patient is not nervous/anxious.   All other systems reviewed and are negative.   Physical Exam: BP 98/60   Pulse 83   Temp (!) 97.3 F (36.3 C)   Ht 4' 9.5" (1.461 m)   Wt 121 lb 6.4 oz (55.1 kg)   SpO2 96%   BMI 25.82 kg/m  Wt Readings from Last 3 Encounters:  03/24/18 121 lb 6.4 oz (55.1 kg)  12/12/17 117 lb (53.1 kg)  09/02/17 120 lb (54.4 kg)   General Appearance: Well nourished, in no apparent distress. Eyes: PERRLA, Right eye gaze  permanently deviated, conjunctiva no swelling or erythema Sinuses: No Frontal/maxillary tenderness ENT/Mouth: Ext aud canals clear, TMs without erythema, bulging. No erythema, swelling, or exudate on post pharynx.  Tonsils not swollen or erythematous. Hearing normal.  Neck: Supple, thyroid normal.  Respiratory: Respiratory effort normal, BS equal bilaterally without rales, rhonchi, wheezing or stridor.  Cardio: RRR with no MRGs. Brisk peripheral pulses without edema.  Abdomen: Soft, + BS.  Non tender, no guarding, rebound, hernias, masses. Lymphatics: Non tender without lymphadenopathy.  Musculoskeletal: Full ROM, no laxity or crepitus to bilateral  knees, 5/5 strength, Normal gait Skin: Warm, dry without rashes, lesions, ecchymosis.  Neuro: Cranial nerves intact. No cerebellar symptoms.  Psych: Awake and oriented X 3, normal affect, Insight and Judgment appropriate.    Izora Ribas, NP 9:10 AM [facility]

## 2018-03-24 ENCOUNTER — Ambulatory Visit: Payer: BLUE CROSS/BLUE SHIELD | Admitting: Adult Health

## 2018-03-24 ENCOUNTER — Encounter: Payer: Self-pay | Admitting: Adult Health

## 2018-03-24 VITALS — BP 98/60 | HR 83 | Temp 97.3°F | Ht <= 58 in | Wt 121.4 lb

## 2018-03-24 DIAGNOSIS — I10 Essential (primary) hypertension: Secondary | ICD-10-CM

## 2018-03-24 DIAGNOSIS — E785 Hyperlipidemia, unspecified: Secondary | ICD-10-CM

## 2018-03-24 DIAGNOSIS — Z6824 Body mass index (BMI) 24.0-24.9, adult: Secondary | ICD-10-CM

## 2018-03-24 DIAGNOSIS — N181 Chronic kidney disease, stage 1: Secondary | ICD-10-CM

## 2018-03-24 DIAGNOSIS — K219 Gastro-esophageal reflux disease without esophagitis: Secondary | ICD-10-CM

## 2018-03-24 DIAGNOSIS — E559 Vitamin D deficiency, unspecified: Secondary | ICD-10-CM

## 2018-03-24 DIAGNOSIS — E1122 Type 2 diabetes mellitus with diabetic chronic kidney disease: Secondary | ICD-10-CM | POA: Diagnosis not present

## 2018-03-24 DIAGNOSIS — E1169 Type 2 diabetes mellitus with other specified complication: Secondary | ICD-10-CM | POA: Diagnosis not present

## 2018-03-24 DIAGNOSIS — Z79899 Other long term (current) drug therapy: Secondary | ICD-10-CM | POA: Diagnosis not present

## 2018-03-24 MED ORDER — TRAZODONE HCL 150 MG PO TABS: ORAL_TABLET | ORAL | 2 refills | Status: DC

## 2018-03-24 NOTE — Patient Instructions (Addendum)
Goals   None      Can try melatonin 5mg -15 mg at night for sleep - this combined with a very dark, cool room helps mimic natural conditions to help your body fall asleep.   Recommend getting black-out curtains and/or an eye mask  Please talk to your family members about being quiet during the day while you rest  Avoid eating during the time that you should be sleeping  Try ear plugs - may need children's size  Continue gabapentin 100-300 mg prior to sleep  Try trazodone 1/3-1 tab at the same time  Try to go to sleep and wake up at the same time as much as possible  Phentermine should be a short term medication; if you still have problems after trying this, we will refer you to neurology for evaluation.   Insomnia Insomnia is frequent trouble falling and/or staying asleep. Insomnia can be a long term problem or a short term problem. Both are common. Insomnia can be a short term problem when the wakefulness is related to a certain stress or worry. Long term insomnia is often related to ongoing stress during waking hours and/or poor sleeping habits. Overtime, sleep deprivation itself can make the problem worse. Every little thing feels more severe because you are overtired and your ability to cope is decreased. CAUSES   Stress, anxiety, and depression.  Poor sleeping habits.  Distractions such as TV in the bedroom.  Naps close to bedtime.  Engaging in emotionally charged conversations before bed.  Technical reading before sleep.  Alcohol and other sedatives. They may make the problem worse. They can hurt normal sleep patterns and normal dream activity.  Stimulants such as caffeine for several hours prior to bedtime.  Pain syndromes and shortness of breath can cause insomnia.  Exercise late at night.  Changing time zones may cause sleeping problems (jet lag). It is sometimes helpful to have someone observe your sleeping patterns. They should look for periods of not  breathing during the night (sleep apnea). They should also look to see how long those periods last. If you live alone or observers are uncertain, you can also be observed at a sleep clinic where your sleep patterns will be professionally monitored. Sleep apnea requires a checkup and treatment. Give your caregivers your medical history. Give your caregivers observations your family has made about your sleep.  SYMPTOMS   Not feeling rested in the morning.  Anxiety and restlessness at bedtime.  Difficulty falling and staying asleep. TREATMENT   Your caregiver may prescribe treatment for an underlying medical disorders. Your caregiver can give advice or help if you are using alcohol or other drugs for self-medication. Treatment of underlying problems will usually eliminate insomnia problems.  Medications can be prescribed for short time use. They are generally not recommended for lengthy use.  Over-the-counter sleep medicines are not recommended for lengthy use. They can be habit forming.  You can promote easier sleeping by making lifestyle changes such as:  Using relaxation techniques that help with breathing and reduce muscle tension.  Exercising earlier in the day.  Changing your diet and the time of your last meal. No night time snacks.  Establish a regular time to go to bed.  Counseling can help with stressful problems and worry.  Soothing music and white noise may be helpful if there are background noises you cannot remove.  Stop tedious detailed work at least one hour before bedtime. HOME CARE INSTRUCTIONS   Keep a diary. Inform your  caregiver about your progress. This includes any medication side effects. See your caregiver regularly. Take note of:  Times when you are asleep.  Times when you are awake during the night.  The quality of your sleep.  How you feel the next day. This information will help your caregiver care for you.  Get out of bed if you are still awake  after 15 minutes. Read or do some quiet activity. Keep the lights down. Wait until you feel sleepy and go back to bed.  Keep regular sleeping and waking hours. Avoid naps.  Exercise regularly.  Avoid distractions at bedtime. Distractions include watching television or engaging in any intense or detailed activity like attempting to balance the household checkbook.  Develop a bedtime ritual. Keep a familiar routine of bathing, brushing your teeth, climbing into bed at the same time each night, listening to soothing music. Routines increase the success of falling to sleep faster.  Use relaxation techniques. This can be using breathing and muscle tension release routines. It can also include visualizing peaceful scenes. You can also help control troubling or intruding thoughts by keeping your mind occupied with boring or repetitive thoughts like the old concept of counting sheep. You can make it more creative like imagining planting one beautiful flower after another in your backyard garden.  During your day, work to eliminate stress. When this is not possible use some of the previous suggestions to help reduce the anxiety that accompanies stressful situations. MAKE SURE YOU:   Understand these instructions.  Will watch your condition.  Will get help right away if you are not doing well or get worse. Document Released: 01/30/2000 Document Revised: 04/26/2011 Document Reviewed: 03/01/2007 [facility] Patient Information 2015 [place], [place]. This information is not intended to replace advice given to you by your health care provider. Make sure you discuss any questions you have with your health care provider.       Bad carbs also include fruit juice, alcohol, and sweet tea. These are empty calories that do not signal to your brain that you are full.   Please remember the good carbs are still carbs which convert into sugar. So please measure them out no more than 1/2-1 cup of rice, oatmeal,  pasta, and beans  Veggies are however free foods! Pile them on.   Not all fruit is created equal. Please see the list below, the fruit at the bottom is higher in sugars than the fruit at the top. Please avoid all dried fruits.         Patellofemoral Pain Syndrome  Patellofemoral pain syndrome is a condition in which the tissue (cartilage) on the underside of the kneecap (patella) softens or breaks down. This causes pain in the front of the knee. The condition is also called runner's knee or chondromalacia patella. Patellofemoral pain syndrome is most common in young adults who are active in sports. The knee is the largest joint in the body. The patella covers the front of the knee and is attached to muscles above and below the knee. The underside of the patella is covered with a smooth type of cartilage (synovium). The smooth surface helps the patella to glide easily when you move your knee. Patellofemoral pain syndrome causes swelling in the joint linings and bone surfaces in the knee. What are the causes? This condition may be caused by:  Overuse of the knee.  Poor alignment of your knee joints.  Weak leg muscles.  A direct blow to your kneecap. What  increases the risk? You are more likely to develop this condition if:  You do a lot of activities that can wear down your kneecap. These include: ? Running. ? Squatting. ? Climbing stairs.  You start a new physical activity or exercise program.  You wear shoes that do not fit well.  You do not have good leg strength.  You are overweight. What are the signs or symptoms? The main symptom of this condition is knee pain. This may feel like a dull, aching pain underneath your patella, in the front of your knee. There may be a popping or cracking sound when you move your knee. Pain may get worse with:  Exercise.  Climbing stairs.  Running.  Jumping.  Squatting.  Kneeling.  Sitting for a long time.  Moving or pushing  on your patella. How is this diagnosed? This condition may be diagnosed based on:  Your symptoms and medical history. You may be asked about your recent physical activities and which ones cause knee pain.  A physical exam. This may include: ? Moving your patella back and forth. ? Checking your range of knee motion. ? Having you squat or jump to see if you have pain. ? Checking the strength of your leg muscles.  Imaging tests to confirm the diagnosis. These may include an MRI of your knee. How is this treated? This condition may be treated at home with rest, ice, compression, and elevation (RICE).  Other treatments may include:  Nonsteroidal anti-inflammatory drugs (NSAIDs).  Physical therapy to stretch and strengthen your leg muscles.  Shoe inserts (orthotics) to take stress off your knee.  A knee brace or knee support.  Adhesive tapes to the skin.  Surgery to remove damaged cartilage or move the patella to a better position. This is rare. Follow these instructions at home: If you have a shoe or brace:  Wear the shoe or brace as told by your health care provider. Remove it only as told by your health care provider.  Loosen the shoe or brace if your toes tingle, become numb, or turn cold and blue.  Keep the shoe or brace clean.  If the shoe or brace is not waterproof: ? Do not let it get wet. ? Cover it with a watertight covering when you take a bath or a shower. Managing pain, stiffness, and swelling  If directed, put ice on the painful area. ? If you have a removable shoe or brace, remove it as told by your health care provider. ? Put ice in a plastic bag. ? Place a towel between your skin and the bag. ? Leave the ice on for 20 minutes, 2-3 times a day.  Move your toes often to avoid stiffness and to lessen swelling.  Rest your knee: ? Avoid activities that cause knee pain. ? When sitting or lying down, raise (elevate) the injured area above the level of your  heart, whenever possible. General instructions  Take over-the-counter and prescription medicines only as told by your health care provider.  Use splints, braces, knee supports, or walking aids as directed by your health care provider.  Perform stretching and strengthening exercises as told by your health care provider or physical therapist.  Do not use any products that contain nicotine or tobacco, such as cigarettes and e-cigarettes. These can delay healing. If you need help quitting, ask your health care provider.  Return to your normal activities as told by your health care provider. Ask your health care provider what activities  are safe for you.  Keep all follow-up visits as told by your health care provider. This is important. Contact a health care provider if:  Your symptoms get worse.  You are not improving with home care. Summary  Patellofemoral pain syndrome is a condition in which the tissue (cartilage) on the underside of the kneecap (patella) softens or breaks down.  This condition causes swelling in the joint linings and bone surfaces in the knee. This leads to pain in the front of the knee.  This condition may be treated at home with rest, ice, compression, and elevation (RICE).  Use splints, braces, knee supports, or walking aids as directed by your health care provider. This information is not intended to replace advice given to you by your health care provider. Make sure you discuss any questions you have with your health care provider. Document Released: 01/20/2009 Document Revised: 03/14/2017 Document Reviewed: 03/14/2017 Elsevier Interactive Patient Education  2019 [employer].

## 2018-03-25 LAB — CBC WITH DIFFERENTIAL/PLATELET
Absolute Monocytes: 654 cells/uL (ref 200–950)
Basophils Absolute: 60 cells/uL (ref 0–200)
Basophils Relative: 0.7 %
Eosinophils Absolute: 146 cells/uL (ref 15–500)
Eosinophils Relative: 1.7 %
HCT: 40.5 % (ref 35.0–45.0)
Hemoglobin: 13.4 g/dL (ref 11.7–15.5)
Lymphs Abs: 2511 cells/uL (ref 850–3900)
MCH: 31.7 pg (ref 27.0–33.0)
MCHC: 33.1 g/dL (ref 32.0–36.0)
MCV: 95.7 fL (ref 80.0–100.0)
MPV: 11.7 fL (ref 7.5–12.5)
Monocytes Relative: 7.6 %
Neutro Abs: 5229 cells/uL (ref 1500–7800)
Neutrophils Relative %: 60.8 %
Platelets: 286 10*3/uL (ref 140–400)
RBC: 4.23 10*6/uL (ref 3.80–5.10)
RDW: 12.8 % (ref 11.0–15.0)
Total Lymphocyte: 29.2 %
WBC: 8.6 10*3/uL (ref 3.8–10.8)

## 2018-03-25 LAB — COMPLETE METABOLIC PANEL WITH GFR
AG Ratio: 1.6 (calc) (ref 1.0–2.5)
ALT: 17 U/L (ref 6–29)
AST: 16 U/L (ref 10–35)
Albumin: 4.4 g/dL (ref 3.6–5.1)
Alkaline phosphatase (APISO): 71 U/L (ref 37–153)
BUN: 15 mg/dL (ref 7–25)
CO2: 29 mmol/L (ref 20–32)
Calcium: 9.9 mg/dL (ref 8.6–10.4)
Chloride: 102 mmol/L (ref 98–110)
Creat: 0.62 mg/dL (ref 0.50–0.99)
GFR, Est African American: 112 mL/min/{1.73_m2} (ref >=60)
GFR, Est Non African American: 97 mL/min/{1.73_m2} (ref >=60)
Globulin: 2.7 g/dL (calc) (ref 1.9–3.7)
Glucose, Bld: 105 mg/dL — ABNORMAL HIGH (ref 65–99)
Potassium: 5.2 mmol/L (ref 3.5–5.3)
Sodium: 140 mmol/L (ref 135–146)
Total Bilirubin: 0.2 mg/dL (ref 0.2–1.2)
Total Protein: 7.1 g/dL (ref 6.1–8.1)

## 2018-03-25 LAB — LIPID PANEL
Cholesterol: 99 mg/dL (ref <200)
HDL: 47 mg/dL — ABNORMAL LOW (ref >=50)
LDL Cholesterol (Calc): 38 mg/dL (calc)
Non-HDL Cholesterol (Calc): 52 mg/dL (calc) (ref <130)
Total CHOL/HDL Ratio: 2.1 (calc) (ref <5.0)
Triglycerides: 65 mg/dL (ref <150)

## 2018-03-25 LAB — MAGNESIUM: Magnesium: 1.6 mg/dL (ref 1.5–2.5)

## 2018-03-25 LAB — TSH: TSH: 1.06 mIU/L (ref 0.40–4.50)

## 2018-03-25 LAB — HEMOGLOBIN A1C
Hgb A1c MFr Bld: 7 % of total Hgb — ABNORMAL HIGH (ref <5.7)
Mean Plasma Glucose: 154 (calc)
eAG (mmol/L): 8.5 (calc)

## 2018-04-05 DIAGNOSIS — H26492 Other secondary cataract, left eye: Secondary | ICD-10-CM | POA: Diagnosis not present

## 2018-04-19 DIAGNOSIS — Z961 Presence of intraocular lens: Secondary | ICD-10-CM | POA: Diagnosis not present

## 2018-04-19 DIAGNOSIS — Q100 Congenital ptosis: Secondary | ICD-10-CM | POA: Diagnosis not present

## 2018-04-19 DIAGNOSIS — H26492 Other secondary cataract, left eye: Secondary | ICD-10-CM | POA: Diagnosis not present

## 2018-04-19 DIAGNOSIS — H5010 Unspecified exotropia: Secondary | ICD-10-CM | POA: Diagnosis not present

## 2018-05-22 DIAGNOSIS — H16291 Other keratoconjunctivitis, right eye: Secondary | ICD-10-CM | POA: Diagnosis not present

## 2018-05-29 ENCOUNTER — Telehealth: Payer: Self-pay

## 2018-05-29 NOTE — Telephone Encounter (Signed)
Patient complaining of congestion and sinus pressure. Today is day two. Uses an inhaler. Has chest tightness with pain going down left side and right shoulder pain that goes down to the elbow. Please advise

## 2018-05-30 ENCOUNTER — Encounter: Payer: Self-pay | Admitting: Adult Health

## 2018-05-30 ENCOUNTER — Ambulatory Visit: Payer: BLUE CROSS/BLUE SHIELD | Admitting: Adult Health

## 2018-05-30 ENCOUNTER — Other Ambulatory Visit: Payer: Self-pay

## 2018-05-30 VITALS — BP 144/77 | HR 66 | Wt 115.0 lb

## 2018-05-30 DIAGNOSIS — J301 Allergic rhinitis due to pollen: Secondary | ICD-10-CM

## 2018-05-30 DIAGNOSIS — R0789 Other chest pain: Secondary | ICD-10-CM | POA: Diagnosis not present

## 2018-05-30 MED ORDER — PREDNISONE 20 MG PO TABS: ORAL_TABLET | ORAL | 0 refills | Status: DC

## 2018-05-30 NOTE — Progress Notes (Signed)
Virtual Visit via Telephone Note  I connected with [name] on 05/30/18 at 11:15 AM EDT by telephone and verified that I am speaking with the correct person using two identifiers.   I discussed the limitations, risks, security and privacy concerns of performing an evaluation and management service by telephone and the availability of in person appointments. I also discussed with the patient that there may be a patient responsible charge related to this service. The patient expressed understanding and agreed to proceed.   History of Present Illness:  BP (!) 144/77   Pulse 66   Wt 115 lb (52.2 kg)   BMI 24.45 kg/m   63 y.o. AA female with hx of allergic rhinitis, asthma, T2DM, htn call to c/o of new onset congestion and sinus pressure 2-3 days; she denies localized tenderness over sinuses, denies headache, fever/chills, sore throat (a bit scratchy in AM), denies dyspnea, cough, wheezing.   She is taking loratadine daily x 3 days; she has mometasone but has not been using consistently. She has not taken other OTC medications.   She also is concerned about a chest pain that she notes with movement or with deep breaths under left breast; describes as brief, catching sensation; she denies dyspnea, wheezing, fatigue. She tolerates exertion without difficulty. She is able to identify area of tenderness to palpation and recreate pain. Reassured patient.   She has no known contact with ill individuals, is staying home as much as possible. No recent travel.    Observations/Objective:  General : Well sounding patient in no apparent distress HEENT: no hoarseness, no cough for duration of visit Lungs: speaks in complete sentences, no audible wheezing, no apparent distress Neurological: alert, oriented x 3 Psychiatric: pleasant, judgement appropriate    Assessment and Plan:  [name] was seen today for acute visit.  Diagnoses and all orders for this visit:  Seasonal allergic  rhinitis due to pollen Discussed the importance of avoiding unnecessary antibiotic therapy. Based on history suspect simple allergic rhinosinusitis Suggested symptomatic OTC remedies; decongestant Nasal saline spray for congestion. Nasal steroids, discussed allergy pill, may switch to allegra or zyrtec if needed, oral steroids offered Follow up as needed (localized sinus tenderness lasting longer than 7 days, fever/chills) -     predniSONE (DELTASONE) 20 MG tablet; 2 tablets daily for 3 days, 1 tablet daily for 4 days.  Left-sided chest wall pain Reassured patient; suspect inflammatory/musculoskeletal origin. Advised rest, avoiding aggravating activities, mobic PRN. Monitor and call if any new/concerning symptoms (exertional pain, dyspnea, fatigue, new cough).   Follow Up Instructions:    I discussed the assessment and treatment plan with the patient. The patient was provided an opportunity to ask questions and all were answered. The patient agreed with the plan and demonstrated an understanding of the instructions.   The patient was advised to call back or seek an in-person evaluation if the symptoms worsen or if the condition fails to improve as anticipated.  I provided 15 minutes of non-face-to-face time during this encounter.   Izora Ribas, NP

## 2018-05-30 NOTE — Telephone Encounter (Signed)
Patient is scheduled today for 11:15, phone visit

## 2018-06-28 ENCOUNTER — Other Ambulatory Visit: Payer: Self-pay | Admitting: *Deleted

## 2018-06-28 MED ORDER — FLUCONAZOLE 150 MG PO TABS: ORAL_TABLET | ORAL | 1 refills | Status: DC

## 2018-06-29 NOTE — Progress Notes (Signed)
THIS ENCOUNTER IS A VIRTUAL VISIT DUE TO COVID-19 - PATIENT WAS NOT SEEN IN THE OFFICE.  PATIENT HAS CONSENTED TO VIRTUAL VISIT / TELEMEDICINE VISIT  This provider placed a call to [name] using telephone, her appointment was changed to a virtual office visit to reduce the risk of exposure to the COVID-19 virus and to help [name] remain healthy and safe. The virtual visit will also provide continuity of care. She verbalizes understanding.   Virtual Visit via telephone Note  I connected with  [name]  on 06/30/18  by telephone.  I verified that I am speaking with the correct person using two identifiers.        I discussed the limitations of evaluation and management by telemedicine and the availability of in person appointments. The patient expressed understanding and agreed to proceed.  History of Present Illness:      This very nice 63 y.o. MBF presents for 6 month follow up with HTN, HLD, T2_NIDDM and Vitamin D Deficiency. Patient has GERD controlled on her meds      Patient is treated for HTN (1996) & BP has been controlled at home. Today's BP is elevated at 156/76 Patient has had no complaints of any cardiac type chest pain, palpitations, dyspnea / orthopnea / PND, dizziness, claudication, or dependent edema.      Hyperlipidemia is controlled with diet & meds. Patient denies myalgias or other med SE's. Last Lipids were at goal: Lab Results  Component Value Date   CHOL 99 03/24/2018   HDL 47 (L) 03/24/2018   LDLCALC 38 03/24/2018   TRIG 65 03/24/2018   CHOLHDL 2.1 03/24/2018       Also, the patient has history of T2_NIDDM circa 1996 and has had no symptoms of reactive hypoglycemia, diabetic polys, paresthesias or visual blurring. She admits not monitoring CBG's.  Last A1c was not at goal: Lab Results  Component Value Date   HGBA1C 7.0 (H) 03/24/2018      Further, the patient also has history of Vitamin D Deficiency and supplements  vitamin D without any suspected side-effects. Last vitamin D was at goal: Lab Results  Component Value Date   VD25OH 83 12/12/2017   Current Outpatient Medications on File Prior to Visit  Medication Sig  . albuterol (PROVENTIL HFA;VENTOLIN HFA) 108 (90 Base) MCG/ACT inhaler Use 1  To 2 inhalations 5 minutes apart every 4 hours if needed to rescue asthma  . aspirin EC 81 MG tablet Take 81 mg by mouth daily.  . B Complex-C (SUPER B COMPLEX PO) Take 1 tablet by mouth daily. Takes every other day  . BAYER MICROLET LANCETS lancets Check blood sugar 1 time daily-DX-E11.9.  [name] Cholecalciferol (VITAMIN D PO) Take 1 capsule by mouth daily. Take 5000 units by mouth once a day  . Cinnamon 500 MG capsule Take 1,000 mg by mouth daily.   . Diclofenac Sodium 1.5 % SOLN Place a small amount of gel on the bilateral hands for hand arthritis  . famotidine (PEPCID) 40 MG tablet Take 1 tablet daily for Acid Indigestion & Heartburn  . fluconazole (DIFLUCAN) 150 MG tablet Use once for yeast infection as needed  . furosemide (LASIX) 20 MG tablet Take 1 tablet (20 mg total) by mouth daily.  [name] gabapentin (NEURONTIN) 100 MG capsule TAKE 1-3 PILLS AT NIGHT (Patient taking differently: as needed. )  . glucose blood (BAYER CONTOUR NEXT TEST) test strip Check blood sugar 1 time daily-DX-E11.9.  [name]  lisinopril-hydrochlorothiazide (PRINZIDE,ZESTORETIC) 10-12.5 MG tablet Take 1 tablet every morning for BP  . MAGNESIUM PO Take 2 tablets by mouth daily.  . meloxicam (MOBIC) 15 MG tablet Take 1/2 to 1 tablet daily with food for pain &  Inflammation - limit to maximum of 5 days /week (Patient taking differently: as needed. Take 1/2 to 1 tablet daily with food for pain &  Inflammation - limit to maximum of 5 days /week)  . metFORMIN (GLUCOPHAGE-XR) 500 MG 24 hr tablet Take 2 tablets 2 x /day with meals for Diabetes  . metoprolol succinate (TOPROL-XL) 25 MG 24 hr tablet TAKE 1 TABLET BY MOUTH DAILY  . mometasone (NASONEX) 50 MCG/ACT  nasal spray Place 2 sprays into the nose daily. (Patient taking differently: Place 2 sprays into the nose as needed. )  . Multiple Vitamin (MULITIVITAMIN WITH MINERALS) TABS Take 1 tablet by mouth daily.  [name] OVER THE COUNTER MEDICATION Take Movefree vitamin 1/2 tablet daily.  [name] OVER THE COUNTER MEDICATION Takes Omega-Q vitamin which contains Tumeric.  . potassium chloride SA (KLOR-CON M20) 20 MEQ tablet Take 1 tablet (20 mEq total) by mouth 3 (three) times daily. (Patient taking differently: Take 20 mEq by mouth 2 (two) times daily. )  . Psyllium (METAMUCIL PO) Take 1 Dose by mouth daily.  [name] rOPINIRole (REQUIP) 1 MG tablet TAKE 1/2 TO 1 TABLET BY MOUTH 3 TIMES A DAY AS NEEDED FOR RESTLESS LEGS  . rosuvastatin (CRESTOR) 40 MG tablet Take 1/2 to 1 tablet daily or as directed for Cholesterol  . traZODone (DESYREL) 150 MG tablet Take 1/3-1 tab 1 hour prior to sleep daily as needed.  . triamcinolone cream (KENALOG) 0.1 % Apply 1 application topically 4 (four) times daily as needed.   No current facility-administered medications on file prior to visit.    Allergies  Allergen Reactions  . Naprosyn [Naproxen] Other (See Comments)    GI upset   PMHx:   Past Medical History:  Diagnosis Date  . Allergic rhinitis   . ASTHMA   . BICUSPID AORTIC VALVE   . CEREBROVASCULAR DISEASE   . DEGENERATIVE JOINT DISEASE   . Diabetes mellitus   . DJD (degenerative joint disease)   . GERD   . HSV-1 (herpes simplex virus 1) infection   . HYPERLIPIDEMIA   . Hypertension   . Palpitation   . Vitamin D deficiency    Immunization History  Administered Date(s) Administered  . DT 10/11/2014  . DTaP 03/18/2004  . Influenza Inj Mdck Quad With Preservative 11/17/2016  . Influenza Split 11/15/2013, 11/30/2014  . Influenza Whole 11/02/2012  . Influenza-Unspecified 10/31/2015, 10/21/2017  . PPD Test 07/30/2013, 10/11/2014, 11/06/2015, 11/17/2016, 12/12/2017  . Pneumococcal Conjugate-13 11/15/2013, 11/17/2016  .  Pneumococcal Polysaccharide-23 05/16/1996   Past Surgical History:  Procedure Laterality Date  . ABDOMINAL HYSTERECTOMY    . repair of trigger finger     FHx:    Reviewed / unchanged  SHx:    Reviewed / unchanged   Systems Review:  Constitutional: Denies fever, chills, wt changes, headaches, insomnia, fatigue, night sweats, change in appetite. Eyes: Denies redness, blurred vision, diplopia, discharge, itchy, watery eyes.  ENT: Denies discharge, congestion, post nasal drip, epistaxis, sore throat, earache, hearing loss, dental pain, tinnitus, vertigo, sinus pain, snoring.  CV: Denies chest pain, palpitations, irregular heartbeat, syncope, dyspnea, diaphoresis, orthopnea, PND, claudication or edema. Respiratory: denies cough, dyspnea, DOE, pleurisy, hoarseness, laryngitis, wheezing.  Gastrointestinal: Denies dysphagia, odynophagia, heartburn, reflux, water brash, abdominal pain or cramps, nausea,  vomiting, bloating, diarrhea, constipation, hematemesis, melena, hematochezia  or hemorrhoids. Genitourinary: Denies dysuria, frequency, urgency, nocturia, hesitancy, discharge, hematuria or flank pain. Musculoskeletal: Denies arthralgias, myalgias, stiffness, jt. swelling, pain, limping or strain/sprain.  Skin: Denies pruritus, rash, hives, warts, acne, eczema or change in skin lesion(s). Neuro: No weakness, tremor, incoordination, spasms, paresthesia or pain. Psychiatric: Denies confusion, memory loss or sensory loss. Endo: Denies change in weight, skin or hair change.  Heme/Lymph: No excessive bleeding, bruising or enlarged lymph nodes.  Physical Exam  BP (!) 156/76   Pulse 73   Temp (!) 97 F (36.1 C)   Resp 12   Ht 4' 9.75" (1.467 m)   Wt 125 lb (56.7 kg)   BMI 26.35 kg/m   General : Well sounding patient in no apparent distress HEENT: no hoarseness, no cough for duration of visit Lungs: speaks in complete sentences, no audible wheezing, no apparent distress Neurological: alert,  oriented x 3 Psychiatric: pleasant, judgement appropriate   Assessment and Plan:  1. Essential hypertension  - Advised increase Lisinopril-Hctz from 1/2 to 1 whole tab & monitor BP's.  - Continue medication, monitor blood pressure at home.  - Continue DASH diet.  Reminder to go to the ER if any CP,  SOB, nausea, dizziness, severe HA, changes vision/speech.  2. Hyperlipidemia, mixed  - Continue diet/meds, exercise,& lifestyle modifications.  - Labs deferred til next visit.  3. Type 2 diabetes mellitus with diabetic neuropathy, without long-term current use of insulin (HCC)  - Continue diet, exercise  - Lifestyle modifications.  - Labs deferred til next visit.  4. Vitamin D deficiency  - Continue supplementation.   5. Gastroesophageal reflux disease  6. Medication management       Discussed  regular exercise, BP monitoring, weight control to achieve/maintain BMI less than 25 and discussed med and SE's. Recommended labs to assess and monitor clinical status with further disposition pending results of labs.  I discussed the assessment and treatment plan with the patient. The patient was provided an opportunity to ask questions and all were answered. The patient agreed with the plan and demonstrated an understanding of the instructions. I provided 23 minutes of non-face-to-face time during this encounter and over 30 minutes of  counseling, chart review and  complex critical decision making was performed   Kirtland Bouchard, MD

## 2018-06-29 NOTE — Patient Instructions (Signed)
- Vit D  And Vit C 1,000 are recommended to help protect against the Covid_19 and other Corona viruses.   - Also it's recommended to take Zinc 50 mg to help protect against the Covid_19  And best place to get  is also on [employer].com and don't pay more than 6-8 cents /pill !   ================================= Coronavirus (COVID-19) Are you at risk?  Are you at risk for the Coronavirus (COVID-19)?  To be considered HIGH RISK for Coronavirus (COVID-19), you have to meet the following criteria:  . Traveled to [place], [place], [place], [place] or [place]; or in the [place] to [place], [place], [place]  . or [place]; and have fever, cough, and shortness of breath within the last 2 weeks of travel OR . Been in close contact with a person diagnosed with COVID-19 within the last 2 weeks and have  . fever, cough,and shortness of breath .  . IF YOU DO NOT MEET THESE CRITERIA, YOU ARE CONSIDERED LOW RISK FOR COVID-19.  What to do if you are HIGH RISK for COVID-19?  [name] If you are having a medical emergency, call 911. . Seek medical care right away. Before you go to a doctor's office, urgent care or emergency department, .  call ahead and tell them about your recent travel, contact with someone diagnosed with COVID-19  .  and your symptoms.  . You should receive instructions from your physician's office regarding next steps of care.  . When you arrive at healthcare provider, tell the healthcare staff immediately you have returned from  . visiting [place], [place], [place], [place] or [place]; or traveled in the [place] to [place], [place],  . [place] or [place] in the last two weeks or you have been in close contact with a person diagnosed with  . COVID-19 in the last 2 weeks.   . Tell the health care staff about your symptoms: fever, cough and shortness of breath. . After you have been seen by a medical provider, you will be either: o Tested for (COVID-19) and discharged  home on quarantine except to seek medical care if  o symptoms worsen, and asked to  - Stay home and avoid contact with others until you get your results (4-5 days)  - Avoid travel on public transportation if possible (such as bus, train, or airplane) or o Sent to the Emergency Department by EMS for evaluation, COVID-19 testing  and  o possible admission depending on your condition and test results.  What to do if you are LOW RISK for COVID-19?  Reduce your risk of any infection by using the same precautions used for avoiding the common cold or flu:  [name] Wash your hands often with soap and warm water for at least 20 seconds.  If soap and water are not readily available,  . use an alcohol-based hand sanitizer with at least 60% alcohol.  . If coughing or sneezing, cover your mouth and nose by coughing or sneezing into the elbow areas of your shirt or coat, .  into a tissue or into your sleeve (not your hands). . Avoid shaking hands with others and consider head nods or verbal greetings only. . Avoid touching your eyes, nose, or mouth with unwashed hands.  . Avoid close contact with people who are sick. . Avoid places or events with large numbers of people in one location, like concerts or sporting events. . Carefully consider travel plans you have or are  making. . If you are planning any travel outside or inside the [place], visit the CDC's Travelers' Health webpage for the latest health notices. . If you have some symptoms but not all symptoms, continue to monitor at home and seek medical attention  . if your symptoms worsen. . If you are having a medical emergency, call 911.   ++++++++++++++++++++++++++++++++ Recommend Adult Low Dose Aspirin or  coated  Aspirin 81 mg daily  To reduce risk of Colon Cancer 20 %,  Skin Cancer 26 % ,  Melanoma 46%  and  Pancreatic cancer 60% ++++++++++++++++++++++++++++++++ Vitamin D goal  is between 70-100.  Please make sure that you are taking your Vitamin  D as directed.  It is very important as a natural anti-inflammatory  helping hair, skin, and nails, as well as reducing stroke and heart attack risk.  It helps your bones and helps with mood. It also decreases numerous cancer risks so please take it as directed.  Low Vit D is associated with a 200-300% higher risk for CANCER  and 200-300% higher risk for HEART   ATTACK  &  STROKE.   .....................................[name] It is also associated with higher death rate at younger ages,  autoimmune diseases like Rheumatoid arthritis, Lupus, Multiple Sclerosis.    Also many other serious conditions, like depression, Alzheimer's Dementia, infertility, muscle aches, fatigue, fibromyalgia - just to name a few. ++++++++++++++++++++ Recommend the book "The END of DIETING" by Dr Excell Seltzer  & the book "The END of DIABETES " by Dr Excell Seltzer At [email or web address] - get book & Audio CD's    Being diabetic has a  300% increased risk for heart attack, stroke, cancer, and alzheimer- type vascular dementia. It is very important that you work harder with diet by avoiding all foods that are white. Avoid white rice (brown & wild rice is OK), white potatoes (sweetpotatoes in moderation is OK), White bread or wheat bread or anything made out of white flour like bagels, donuts, rolls, buns, biscuits, cakes, pastries, cookies, pizza crust, and pasta (made from white flour & egg whites) - vegetarian pasta or spinach or wheat pasta is OK. Multigrain breads like Arnold's or [employer], or multigrain sandwich thins or flatbreads.  Diet, exercise and weight loss can reverse and cure diabetes in the early stages.  Diet, exercise and weight loss is very important in the control and prevention of complications of diabetes which affects every system in your body, ie. Brain - dementia/stroke, eyes - glaucoma/blindness, heart - heart attack/heart failure, kidneys - dialysis, stomach - gastric paralysis, intestines - malabsorption,  nerves - severe painful neuritis, circulation - gangrene & loss of a leg(s), and finally cancer and Alzheimers.    I recommend avoid fried & greasy foods,  sweets/candy, white rice (brown or wild rice or Quinoa is OK), white potatoes (sweet potatoes are OK) - anything made from white flour - bagels, doughnuts, rolls, buns, biscuits,white and wheat breads, pizza crust and traditional pasta made of white flour & egg white(vegetarian pasta or spinach or wheat pasta is OK).  Multi-grain bread is OK - like multi-grain flat bread or sandwich thins. Avoid alcohol in excess. Exercise is also important.    Eat all the vegetables you want - avoid meat, especially red meat and dairy - especially cheese.  Cheese is the most concentrated form of trans-fats which is the worst thing to clog up our arteries. Veggie cheese is OK which can be found in the fresh produce section at [facility]  or [employer] or [employer]  +++++++++++++++++++++ DASH Eating Plan  DASH stands for "Dietary Approaches to Stop Hypertension."   The DASH eating plan is a healthy eating plan that has been shown to reduce high blood pressure (hypertension). Additional health benefits may include reducing the risk of type 2 diabetes mellitus, heart disease, and stroke. The DASH eating plan may also help with weight loss. WHAT DO I NEED TO KNOW ABOUT THE DASH EATING PLAN? For the DASH eating plan, you will follow these general guidelines:  Choose foods with a percent daily value for sodium of less than 5% (as listed on the food label).  Use salt-free seasonings or herbs instead of table salt or sea salt.  Check with your health care provider or pharmacist before using salt substitutes.  Eat lower-sodium products, often labeled as "lower sodium" or "no salt added."  Eat fresh foods.  Eat more vegetables, fruits, and low-fat dairy products.  Choose whole grains. Look for the word "whole" as the first word in the ingredient  list.  Choose fish   Limit sweets, desserts, sugars, and sugary drinks.  Choose heart-healthy fats.  Eat veggie cheese   Eat more home-cooked food and less restaurant, buffet, and fast food.  Limit fried foods.  Cook foods using methods other than frying.  Limit canned vegetables. If you do use them, rinse them well to decrease the sodium.  When eating at a restaurant, ask that your food be prepared with less salt, or no salt if possible.                      WHAT FOODS CAN I EAT? Read Dr Fara Olden Fuhrman's books on The End of Dieting & The End of Diabetes  Grains Whole grain or whole wheat bread. Brown rice. Whole grain or whole wheat pasta. Quinoa, bulgur, and whole grain cereals. Low-sodium cereals. Corn or whole wheat flour tortillas. Whole grain cornbread. Whole grain crackers. Low-sodium crackers.  Vegetables Fresh or frozen vegetables (raw, steamed, roasted, or grilled). Low-sodium or reduced-sodium tomato and vegetable juices. Low-sodium or reduced-sodium tomato sauce and paste. Low-sodium or reduced-sodium canned vegetables.   Fruits All fresh, canned (in natural juice), or frozen fruits.  Protein Products  All fish and seafood.  Dried beans, peas, or lentils. Unsalted nuts and seeds. Unsalted canned beans.  Dairy Low-fat dairy products, such as skim or 1% milk, 2% or reduced-fat cheeses, low-fat ricotta or cottage cheese, or plain low-fat yogurt. Low-sodium or reduced-sodium cheeses.  Fats and Oils Tub margarines without trans fats. Light or reduced-fat mayonnaise and salad dressings (reduced sodium). Avocado. Safflower, olive, or canola oils. Natural peanut or almond butter.  Other Unsalted popcorn and pretzels. The items listed above may not be a complete list of recommended foods or beverages. Contact your dietitian for more options.  +++++++++++++++  WHAT FOODS ARE NOT RECOMMENDED? Grains/ White flour or wheat flour White bread. White pasta. White rice.  Refined cornbread. Bagels and croissants. Crackers that contain trans fat.  Vegetables  Creamed or fried vegetables. Vegetables in a . Regular canned vegetables. Regular canned tomato sauce and paste. Regular tomato and vegetable juices.  Fruits Dried fruits. Canned fruit in light or heavy syrup. Fruit juice.  Meat and Other Protein Products Meat in general - RED meat & White meat.  Fatty cuts of meat. Ribs, chicken wings, all processed meats as bacon, sausage, bologna, salami, fatback, hot dogs, bratwurst and packaged luncheon meats.  Dairy Whole or 2% milk,  half-and-half, and cream cheese. Whole-fat or sweetened yogurt. Full-fat cheeses or blue cheese. Non-dairy creamers and whipped toppings. Processed cheese, cheese spreads, or cheese curds.  Condiments Onion and garlic salt, seasoned salt, table salt, and sea salt. Canned and packaged gravies. Worcestershire sauce. Tartar sauce. Barbecue sauce. Teriyaki sauce. Soy sauce, including reduced sodium. Steak sauce. Fish sauce. Oyster sauce. Cocktail sauce. Horseradish. Ketchup and mustard. Meat flavorings and tenderizers. Bouillon cubes. Hot sauce. Tabasco sauce. Marinades. Taco seasonings. Relishes.  Fats and Oils Butter, stick margarine, lard, shortening and bacon fat. Coconut, palm kernel, or palm oils. Regular salad dressings.  Pickles and olives. Salted popcorn and pretzels.  The items listed above may not be a complete list of foods and beverages to avoid.    

## 2018-06-30 ENCOUNTER — Ambulatory Visit: Payer: BLUE CROSS/BLUE SHIELD | Admitting: Internal Medicine

## 2018-06-30 ENCOUNTER — Ambulatory Visit: Payer: Self-pay | Admitting: Internal Medicine

## 2018-06-30 ENCOUNTER — Encounter: Payer: Self-pay | Admitting: Internal Medicine

## 2018-06-30 VITALS — BP 156/76 | HR 73 | Temp 97.0°F | Resp 12 | Ht <= 58 in | Wt 125.0 lb

## 2018-06-30 DIAGNOSIS — K219 Gastro-esophageal reflux disease without esophagitis: Secondary | ICD-10-CM

## 2018-06-30 DIAGNOSIS — I10 Essential (primary) hypertension: Secondary | ICD-10-CM | POA: Diagnosis not present

## 2018-06-30 DIAGNOSIS — Z79899 Other long term (current) drug therapy: Secondary | ICD-10-CM

## 2018-06-30 DIAGNOSIS — G8929 Other chronic pain: Secondary | ICD-10-CM

## 2018-06-30 DIAGNOSIS — M545 Low back pain: Secondary | ICD-10-CM

## 2018-06-30 DIAGNOSIS — E782 Mixed hyperlipidemia: Secondary | ICD-10-CM | POA: Diagnosis not present

## 2018-06-30 DIAGNOSIS — E559 Vitamin D deficiency, unspecified: Secondary | ICD-10-CM | POA: Diagnosis not present

## 2018-06-30 DIAGNOSIS — E1140 Type 2 diabetes mellitus with diabetic neuropathy, unspecified: Secondary | ICD-10-CM

## 2018-06-30 MED ORDER — MELOXICAM 15 MG PO TABS: ORAL_TABLET | ORAL | 1 refills | Status: DC

## 2018-06-30 MED ORDER — POTASSIUM CHLORIDE CRYS ER 20 MEQ PO TBCR: 20.0000 meq | EXTENDED_RELEASE_TABLET | Freq: Three times a day (TID) | ORAL | 1 refills | Status: DC

## 2018-06-30 MED ORDER — PHENTERMINE HCL 37.5 MG PO TABS: ORAL_TABLET | ORAL | 1 refills | Status: DC

## 2018-06-30 MED ORDER — MOMETASONE FUROATE 50 MCG/ACT NA SUSP: 2.0000 | Freq: Every day | NASAL | 3 refills | Status: DC

## 2018-07-03 ENCOUNTER — Other Ambulatory Visit: Payer: Self-pay

## 2018-07-03 DIAGNOSIS — Q141 Congenital malformation of retina: Secondary | ICD-10-CM | POA: Diagnosis not present

## 2018-07-03 DIAGNOSIS — E119 Type 2 diabetes mellitus without complications: Secondary | ICD-10-CM | POA: Diagnosis not present

## 2018-07-03 LAB — HM DIABETES EYE EXAM

## 2018-07-06 ENCOUNTER — Encounter: Payer: Self-pay | Admitting: *Deleted

## 2018-07-08 ENCOUNTER — Other Ambulatory Visit: Payer: Self-pay | Admitting: Internal Medicine

## 2018-07-08 DIAGNOSIS — E119 Type 2 diabetes mellitus without complications: Secondary | ICD-10-CM

## 2018-07-13 ENCOUNTER — Other Ambulatory Visit: Payer: Self-pay | Admitting: *Deleted

## 2018-07-13 DIAGNOSIS — E119 Type 2 diabetes mellitus without complications: Secondary | ICD-10-CM

## 2018-07-13 MED ORDER — METFORMIN HCL ER 500 MG PO TB24: ORAL_TABLET | ORAL | 1 refills | Status: DC

## 2018-07-16 ENCOUNTER — Other Ambulatory Visit: Payer: Self-pay | Admitting: Internal Medicine

## 2018-08-17 ENCOUNTER — Other Ambulatory Visit: Payer: Self-pay | Admitting: Adult Health

## 2018-08-28 DIAGNOSIS — M5412 Radiculopathy, cervical region: Secondary | ICD-10-CM | POA: Diagnosis not present

## 2018-08-28 DIAGNOSIS — M47892 Other spondylosis, cervical region: Secondary | ICD-10-CM | POA: Diagnosis not present

## 2018-10-03 NOTE — Progress Notes (Signed)
FOLLOW UP  Assessment and Plan:   Hypertension Well controlled with current medications  Monitor blood pressure at home; patient to call if consistently greater than 130/80 Continue DASH diet.   Reminder to go to the ER if any CP, SOB, nausea, dizziness, severe HA, changes vision/speech, left arm numbness and tingling and jaw pain.  Cholesterol Currently above goal of LDL <70; continue crestor  Continue low cholesterol diet and exercise.  Check lipid panel.   Diabetes with diabetic chronic kidney disease Continue medication: metformin Continue diet and exercise.  Perform daily foot/skin check, notify office of any concerning changes.  Check A1C Not checking sugars at home, pending A1C may request she start  Vitamin D Def At goal at last visit; continue supplementation to maintain goal of 70-100  Vit D level  Insomnia/somnolensce due to shift work Continue trazodone as needed ? Need sleep study, declines at this time  Continue diet and meds as discussed. Further disposition pending results of labs. Discussed med's effects and SE's.   Over 30 minutes of exam, counseling, chart review, and critical decision making was performed.   Future Appointments  Date Time Provider [place]  12/26/2018 10:00 AM [name], MD [facility] None    ----------------------------------------------------------------------------------------------------------------------  HPI 63 y.o. female  presents for 3 month follow up on hypertension, cholesterol, diabetes, weight and vitamin D deficiency.   She is followed by Dr. Stanford Breed for cerebrovascular disease with most recent dopplers in 2017 showing mild disease only, with recommendation to continue ASA and statin.    she is prescribed phentermine for weight loss and for somnolence due to shift work, she reports taking 1/2 tab prior to work 5 days week, doesn't take on weekends.   She works from [employer]- 3-5:30 AM, taking the trazodone  150 1/2 at night that is helping. melatonin, taking gabapentin 300 mg at HS.   She does have a history of RLS, she is NOT on requip. She is on an iron supplement 65mg  BID.   Lab Results  Component Value Date   IRON 38 (L) 12/12/2017   TIBC 326 12/12/2017   FERRITIN 48 07/24/2015   BMI is Body mass index is 26.5 kg/m., she is working on diet and exercise. Wt Readings from Last 3 Encounters:  10/05/18 124 lb 9.6 oz (56.5 kg)  06/30/18 125 lb (56.7 kg)  05/30/18 115 lb (52.2 kg)   She has not been checking her BP at home, today their BP is BP: 130/82  She does not workout. She denies chest pain, shortness of breath, dizziness.   She is on cholesterol medication on rouvastatin and denies myalgias. Her cholesterol is not at goal. The cholesterol last visit was:   Lab Results  Component Value Date   CHOL 99 03/24/2018   HDL 47 (L) 03/24/2018   LDLCALC 38 03/24/2018   TRIG 65 03/24/2018   CHOLHDL 2.1 03/24/2018    She has not been working on diet and exercise for T2 diabetes With hyperlipidemia on crestor CAD on ASA  no CKD She is on ACE  on metformin, Cinnamon Could not tolerate invokana denies increased appetite, nausea, paresthesia of the feet, polydipsia, polyuria, visual disturbances and vomiting. She reports she has not been checking her sugars despite being advised to do so. She denies any symptoms of hypoglycemia. Last A1C in the office was:  Lab Results  Component Value Date   HGBA1C 7.0 (H) 03/24/2018   Lab Results  Component Value Date   GFRAA 112 03/24/2018  Patient is on Vitamin D supplement and above goal at the last check:    Lab Results  Component Value Date   VD25OH 83 12/12/2017        Current Medications:  Current Outpatient Medications on File Prior to Visit  Medication Sig  . aspirin EC 81 MG tablet Take 81 mg by mouth daily.  . B Complex-C (SUPER B COMPLEX PO) Take 1 tablet by mouth daily. Takes every other day  . BAYER MICROLET LANCETS lancets  Check blood sugar 1 time daily-DX-E11.9.  [name] Cholecalciferol (VITAMIN D PO) Take 1 capsule by mouth daily. Take 5000 units by mouth once a day  . Cinnamon 500 MG capsule Take 1,000 mg by mouth daily.   . Diclofenac Sodium 1.5 % SOLN Place a small amount of gel on the bilateral hands for hand arthritis  . famotidine (PEPCID) 40 MG tablet Take 1 tablet daily for Acid Indigestion & Heartburn  . fluconazole (DIFLUCAN) 150 MG tablet Use once for yeast infection as needed  . gabapentin (NEURONTIN) 100 MG capsule TAKE 1-3 PILLS AT NIGHT (Patient taking differently: as needed. )  . glucose blood (BAYER CONTOUR NEXT TEST) test strip Check blood sugar 1 time daily-DX-E11.9.  . lisinopril-hydrochlorothiazide (PRINZIDE,ZESTORETIC) 10-12.5 MG tablet Take 1 tablet every morning for BP  . MAGNESIUM PO Take 2 tablets by mouth daily.  . meloxicam (MOBIC) 15 MG tablet Take 1/2 to 1 tablet daily with food for pain &  Inflammation - limit to maximum of 5 days /week  . metFORMIN (GLUCOPHAGE-XR) 500 MG 24 hr tablet Take 2 tablets 2 x /day with Meals for Diabetes  . metoprolol succinate (TOPROL-XL) 25 MG 24 hr tablet Take 1 tablet Daily for BP  . mometasone (NASONEX) 50 MCG/ACT nasal spray Place 2 sprays into the nose daily.  . Multiple Vitamin (MULITIVITAMIN WITH MINERALS) TABS Take 1 tablet by mouth daily.  [name] OVER THE COUNTER MEDICATION Take Movefree vitamin 1/2 tablet daily.  [name] OVER THE COUNTER MEDICATION Takes Omega-Q vitamin which contains Tumeric.  . phentermine (ADIPEX-P) 37.5 MG tablet Take 1/2 to 1 tablet every morning for dieting & weightloss  . potassium chloride SA (K-DUR) 20 MEQ tablet Take 1 tablet 3 x /day after meals  . Psyllium (METAMUCIL PO) Take 1 Dose by mouth daily.  . rosuvastatin (CRESTOR) 40 MG tablet Take 1/2 to 1 tablet daily or as directed for Cholesterol  . traZODone (DESYREL) 150 MG tablet Take 1/3-1 tab 1 hour prior to sleep daily as needed.  . triamcinolone cream (KENALOG) 0.1 % Apply 1  application topically 4 (four) times daily as needed.  [name] albuterol (PROVENTIL HFA;VENTOLIN HFA) 108 (90 Base) MCG/ACT inhaler Use 1  To 2 inhalations 5 minutes apart every 4 hours if needed to rescue asthma  . furosemide (LASIX) 20 MG tablet Take 1 tablet (20 mg total) by mouth daily.   No current facility-administered medications on file prior to visit.      Allergies:  Allergies  Allergen Reactions  . Naprosyn [Naproxen] Other (See Comments)    GI upset     Medical History:  Past Medical History:  Diagnosis Date  . Allergic rhinitis   . ASTHMA   . BICUSPID AORTIC VALVE   . CEREBROVASCULAR DISEASE   . DEGENERATIVE JOINT DISEASE   . Diabetes mellitus   . DJD (degenerative joint disease)   . GERD   . HSV-1 (herpes simplex virus 1) infection   . HYPERLIPIDEMIA   . Hypertension   .  Palpitation   . Vitamin D deficiency    Family history- Reviewed and unchanged Social history- Reviewed and unchanged   Review of Systems:  Review of Systems  Constitutional: Positive for malaise/fatigue (daytime fatigue/somnolensce ). Negative for weight loss.  HENT: Negative for hearing loss and tinnitus.   Eyes: Negative for blurred vision and double vision.  Respiratory: Negative for cough, shortness of breath and wheezing.   Cardiovascular: Negative for chest pain, palpitations, orthopnea, claudication and leg swelling.  Gastrointestinal: Negative for abdominal pain, blood in stool, constipation, diarrhea, heartburn, melena, nausea and vomiting.  Genitourinary: Negative.   Musculoskeletal: Negative for falls, joint pain, myalgias and neck pain.  Skin: Negative for rash.  Neurological: Negative for dizziness, tingling, sensory change, weakness and headaches.  Endo/Heme/Allergies: Negative for polydipsia.  Psychiatric/Behavioral: Negative for depression and substance abuse. The patient has insomnia. The patient is not nervous/anxious.   All other systems reviewed and are  negative.   Physical Exam: BP 130/82   Pulse 68   Temp (!) 97.5 F (36.4 C)   Ht 4' 9.5" (1.461 m)   Wt 124 lb 9.6 oz (56.5 kg)   SpO2 97%   BMI 26.50 kg/m  Wt Readings from Last 3 Encounters:  10/05/18 124 lb 9.6 oz (56.5 kg)  06/30/18 125 lb (56.7 kg)  05/30/18 115 lb (52.2 kg)   General Appearance: Well nourished, in no apparent distress. Eyes: PERRLA, Right eye gaze permanently deviated, conjunctiva no swelling or erythema Sinuses: No Frontal/maxillary tenderness ENT/Mouth: Ext aud canals clear, TMs without erythema, bulging. No erythema, swelling, or exudate on post pharynx.  Tonsils not swollen or erythematous. Hearing normal.  Neck: Supple, thyroid normal.  Respiratory: Respiratory effort normal, BS equal bilaterally without rales, rhonchi, wheezing or stridor.  Cardio: RRR with no MRGs. Brisk peripheral pulses without edema.  Abdomen: Soft, + BS.  Non tender, no guarding, rebound, hernias, masses. Lymphatics: Non tender without lymphadenopathy.  Musculoskeletal: Full ROM, no laxity or crepitus to bilateral knees, 5/5 strength, Normal gait Skin: Warm, dry without rashes, lesions, ecchymosis.  Neuro: Cranial nerves intact. No cerebellar symptoms.  Psych: Awake and oriented X 3, normal affect, Insight and Judgment appropriate.    Vicie Mutters, PA-C 9:01 AM [facility]

## 2018-10-05 ENCOUNTER — Encounter: Payer: Self-pay | Admitting: Physician Assistant

## 2018-10-05 ENCOUNTER — Other Ambulatory Visit: Payer: Self-pay

## 2018-10-05 ENCOUNTER — Ambulatory Visit: Payer: BLUE CROSS/BLUE SHIELD | Admitting: Physician Assistant

## 2018-10-05 VITALS — BP 130/82 | HR 68 | Temp 97.5°F | Ht <= 58 in | Wt 124.6 lb

## 2018-10-05 DIAGNOSIS — E785 Hyperlipidemia, unspecified: Secondary | ICD-10-CM

## 2018-10-05 DIAGNOSIS — Z79899 Other long term (current) drug therapy: Secondary | ICD-10-CM | POA: Diagnosis not present

## 2018-10-05 DIAGNOSIS — I10 Essential (primary) hypertension: Secondary | ICD-10-CM | POA: Diagnosis not present

## 2018-10-05 DIAGNOSIS — G4726 Circadian rhythm sleep disorder, shift work type: Secondary | ICD-10-CM

## 2018-10-05 DIAGNOSIS — N181 Chronic kidney disease, stage 1: Secondary | ICD-10-CM

## 2018-10-05 DIAGNOSIS — E1169 Type 2 diabetes mellitus with other specified complication: Secondary | ICD-10-CM

## 2018-10-05 DIAGNOSIS — E1122 Type 2 diabetes mellitus with diabetic chronic kidney disease: Secondary | ICD-10-CM | POA: Diagnosis not present

## 2018-10-05 DIAGNOSIS — E559 Vitamin D deficiency, unspecified: Secondary | ICD-10-CM

## 2018-10-05 DIAGNOSIS — E611 Iron deficiency: Secondary | ICD-10-CM

## 2018-10-05 MED ORDER — PHENTERMINE HCL 37.5 MG PO TABS: ORAL_TABLET | ORAL | 1 refills | Status: DC

## 2018-10-05 NOTE — Patient Instructions (Signed)
    When it comes to diets, agreement about the perfect plan isn't easy to find, even among the experts. Experts at the [employer] developed an idea known as the Healthy Eating Plate. Just imagine a plate divided into logical, healthy portions.  The emphasis is on diet quality:  Load up on vegetables and fruits - one-half of your plate: Aim for color and variety, and remember that potatoes don't count.  Go for whole grains - one-quarter of your plate: Whole wheat, barley, wheat berries, quinoa, oats, brown rice, and foods made with them. If you want pasta, go with whole wheat pasta.  Protein power - one-quarter of your plate: Fish, chicken, beans, and nuts are all healthy, versatile protein sources. Limit red meat.  The diet, however, does go beyond the plate, offering a few other suggestions.  Use healthy plant oils, such as olive, canola, soy, corn, sunflower and peanut. Check the labels, and avoid partially hydrogenated oil, which have unhealthy trans fats.  If you're thirsty, drink water. Coffee and tea are good in moderation, but skip sugary drinks and limit milk and dairy products to one or two daily servings.  The type of carbohydrate in the diet is more important than the amount. Some sources of carbohydrates, such as vegetables, fruits, whole grains, and beans--are healthier than others.  Finally, stay active.  

## 2018-10-06 LAB — COMPLETE METABOLIC PANEL WITH GFR
AG Ratio: 1.7 (calc) (ref 1.0–2.5)
ALT: 23 U/L (ref 6–29)
AST: 17 U/L (ref 10–35)
Albumin: 4.2 g/dL (ref 3.6–5.1)
Alkaline phosphatase (APISO): 59 U/L (ref 37–153)
BUN: 18 mg/dL (ref 7–25)
CO2: 29 mmol/L (ref 20–32)
Calcium: 9.9 mg/dL (ref 8.6–10.4)
Chloride: 101 mmol/L (ref 98–110)
Creat: 0.53 mg/dL (ref 0.50–0.99)
GFR, Est African American: 118 mL/min/{1.73_m2} (ref >=60)
GFR, Est Non African American: 102 mL/min/{1.73_m2} (ref >=60)
Globulin: 2.5 g/dL (calc) (ref 1.9–3.7)
Glucose, Bld: 118 mg/dL — ABNORMAL HIGH (ref 65–99)
Potassium: 4 mmol/L (ref 3.5–5.3)
Sodium: 140 mmol/L (ref 135–146)
Total Bilirubin: 0.3 mg/dL (ref 0.2–1.2)
Total Protein: 6.7 g/dL (ref 6.1–8.1)

## 2018-10-06 LAB — CBC WITH DIFFERENTIAL/PLATELET
Absolute Monocytes: 543 cells/uL (ref 200–950)
Basophils Absolute: 54 cells/uL (ref 0–200)
Basophils Relative: 0.8 %
Eosinophils Absolute: 60 cells/uL (ref 15–500)
Eosinophils Relative: 0.9 %
HCT: 40.4 % (ref 35.0–45.0)
Hemoglobin: 13 g/dL (ref 11.7–15.5)
Lymphs Abs: 1889 cells/uL (ref 850–3900)
MCH: 31.5 pg (ref 27.0–33.0)
MCHC: 32.2 g/dL (ref 32.0–36.0)
MCV: 97.8 fL (ref 80.0–100.0)
MPV: 11.4 fL (ref 7.5–12.5)
Monocytes Relative: 8.1 %
Neutro Abs: 4154 cells/uL (ref 1500–7800)
Neutrophils Relative %: 62 %
Platelets: 242 10*3/uL (ref 140–400)
RBC: 4.13 10*6/uL (ref 3.80–5.10)
RDW: 12.6 % (ref 11.0–15.0)
Total Lymphocyte: 28.2 %
WBC: 6.7 10*3/uL (ref 3.8–10.8)

## 2018-10-06 LAB — LIPID PANEL
Cholesterol: 91 mg/dL (ref <200)
HDL: 42 mg/dL — ABNORMAL LOW (ref >=50)
LDL Cholesterol (Calc): 37 mg/dL (calc)
Non-HDL Cholesterol (Calc): 49 mg/dL (calc) (ref <130)
Total CHOL/HDL Ratio: 2.2 (calc) (ref <5.0)
Triglycerides: 41 mg/dL (ref <150)

## 2018-10-06 LAB — IRON, TOTAL/TOTAL IRON BINDING CAP
%SAT: 20 % (calc) (ref 16–45)
Iron: 54 ug/dL (ref 45–160)
TIBC: 276 mcg/dL (calc) (ref 250–450)

## 2018-10-06 LAB — HEMOGLOBIN A1C
Hgb A1c MFr Bld: 7.5 % of total Hgb — ABNORMAL HIGH (ref <5.7)
Mean Plasma Glucose: 169 (calc)
eAG (mmol/L): 9.3 (calc)

## 2018-10-06 LAB — VITAMIN D 25 HYDROXY (VIT D DEFICIENCY, FRACTURES): Vit D, 25-Hydroxy: 80 ng/mL (ref 30–100)

## 2018-10-06 LAB — MAGNESIUM: Magnesium: 1.8 mg/dL (ref 1.5–2.5)

## 2018-10-06 LAB — TSH: TSH: 0.68 mIU/L (ref 0.40–4.50)

## 2018-10-06 LAB — FERRITIN: Ferritin: 79 ng/mL (ref 16–288)

## 2018-10-09 DIAGNOSIS — M5412 Radiculopathy, cervical region: Secondary | ICD-10-CM | POA: Diagnosis not present

## 2018-10-14 ENCOUNTER — Other Ambulatory Visit: Payer: Self-pay | Admitting: Adult Health

## 2018-10-16 ENCOUNTER — Telehealth: Payer: Self-pay | Admitting: Physician Assistant

## 2018-10-16 MED ORDER — TRAZODONE HCL 150 MG PO TABS: ORAL_TABLET | ORAL | 1 refills | Status: DC

## 2018-10-16 NOTE — Telephone Encounter (Signed)
renewal request Trazadone 150mg  to mail order [facility]

## 2018-10-16 NOTE — Addendum Note (Signed)
Addended by: Elsie Amis D on: 10/16/2018 12:26 PM   Modules accepted: Orders

## 2018-10-22 ENCOUNTER — Other Ambulatory Visit: Payer: Self-pay | Admitting: Internal Medicine

## 2018-11-18 DIAGNOSIS — Z20828 Contact with and (suspected) exposure to other viral communicable diseases: Secondary | ICD-10-CM | POA: Diagnosis not present

## 2018-11-23 DIAGNOSIS — M79641 Pain in right hand: Secondary | ICD-10-CM | POA: Diagnosis not present

## 2018-11-23 DIAGNOSIS — M19031 Primary osteoarthritis, right wrist: Secondary | ICD-10-CM | POA: Diagnosis not present

## 2018-11-26 ENCOUNTER — Other Ambulatory Visit: Payer: Self-pay | Admitting: Internal Medicine

## 2018-12-25 ENCOUNTER — Encounter: Payer: Self-pay | Admitting: Internal Medicine

## 2018-12-25 NOTE — Patient Instructions (Signed)

## 2018-12-25 NOTE — Progress Notes (Signed)
Annual Screening/Preventative Visit & Comprehensive Evaluation &  Examination     This very nice 63 y.o. MBF presents for a Screening /Preventative Visit & comprehensive evaluation and management of multiple medical co-morbidities.  Patient has been followed for HTN, HLD, T2_NIDDM  and Vitamin D Deficiency. Patient's GERD is controlled on her meds.      HTN predates circa 1996. Patient's BP has been controlled at home and patient denies any cardiac symptoms as chest pain, palpitations, shortness of breath, dizziness or ankle swelling. Today's BP is at goal - 118/76.     Patient's hyperlipidemia is controlled with diet and Rosuvastatin. Patient denies myalgias or other medication SE's. Last lipids were at goal:  Lab Results  Component Value Date   CHOL 91 10/05/2018   HDL 42 (L) 10/05/2018   LDLCALC 37 10/05/2018   TRIG 41 10/05/2018   CHOLHDL 2.2 10/05/2018      Patient has hx/o T2_NIDDM (2003).  She denies reactive hypoglycemic symptoms, visual blurring, diabetic polys or paresthesias. Her last A1c was not at goal:  Lab Results  Component Value Date   HGBA1C 7.5 (H) 10/05/2018      Finally, patient has history of Vitamin D Deficiency ("30" / 2008) and last Vitamin D was at goal:  Lab Results  Component Value Date   VD25OH 30 10/05/2018   Current Outpatient Medications on File Prior to Visit  Medication Sig  . aspirin EC 81 MG tablet Take 81 mg by mouth daily.  . B Complex-C (SUPER B COMPLEX PO) Take 1 tablet by mouth daily. Takes every other day  . BAYER MICROLET LANCETS lancets Check blood sugar 1 time daily-DX-E11.9.  [name] Cholecalciferol (VITAMIN D PO) Take 2 capsules by mouth daily. Take 4000 units by mouth twice a day  . Cinnamon 500 MG capsule Take 1,000 mg by mouth daily.   . cyclobenzaprine (FLEXERIL) 10 MG tablet Take 10 mg by mouth at bedtime. Takes 1/2 tablet at bedtime.  . Diclofenac Sodium 1.5 % SOLN Place a small amount of gel on the bilateral hands for hand  arthritis  . famotidine (PEPCID) 40 MG tablet Take 1 tablet daily for Acid Indigestion & Heartburn  . fluconazole (DIFLUCAN) 150 MG tablet Use once for yeast infection as needed  . gabapentin (NEURONTIN) 100 MG capsule TAKE 1-3 PILLS AT NIGHT (Patient taking differently: as needed. )  . glucose blood (BAYER CONTOUR NEXT TEST) test strip Check blood sugar 1 time daily-DX-E11.9.  . lisinopril-hydrochlorothiazide (ZESTORETIC) 10-12.5 MG tablet Take 1 tablet Daily for BP & Diabetic Kidney Protection  . MAGNESIUM PO Take 2 tablets by mouth daily.  . meloxicam (MOBIC) 15 MG tablet Take 1/2 to 1 tablet daily with food for pain &  Inflammation - limit to maximum of 5 days /week  . metFORMIN (GLUCOPHAGE-XR) 500 MG 24 hr tablet Take 2 tablets 2 x /day with Meals for Diabetes  . metoprolol succinate (TOPROL-XL) 25 MG 24 hr tablet Take 1 tablet Daily for BP  . mometasone (NASONEX) 50 MCG/ACT nasal spray Place 2 sprays into the nose daily.  . Multiple Vitamin (MULITIVITAMIN WITH MINERALS) TABS Take 1 tablet by mouth daily.  [name] OVER THE COUNTER MEDICATION Takes Omega-Q vitamin which contains Tumeric.  . phentermine (ADIPEX-P) 37.5 MG tablet Take 1/2 to 1 tablet every morning for dieting & weightloss  . potassium chloride SA (K-DUR) 20 MEQ tablet Take 1 tablet 3 x /day after meals  . Psyllium (METAMUCIL PO) Take 1 Dose by mouth daily.  [name]  rosuvastatin (CRESTOR) 40 MG tablet Take 1/2 to 1 tablet daily or as directed for Cholesterol  . traZODone (DESYREL) 150 MG tablet Take 1/2 to 1 tablet 1  to 2 hours before Bedtime  . triamcinolone cream (KENALOG) 0.1 % Apply 1 application topically 4 (four) times daily as needed.  [name] albuterol (PROVENTIL HFA;VENTOLIN HFA) 108 (90 Base) MCG/ACT inhaler Use 1  To 2 inhalations 5 minutes apart every 4 hours if needed to rescue asthma  . furosemide (LASIX) 20 MG tablet Take 1 tablet (20 mg total) by mouth daily.   No current facility-administered medications on file prior to  visit.    Allergies  Allergen Reactions  . Naprosyn [Naproxen] Other (See Comments)    GI upset   Past Medical History:  Diagnosis Date  . Allergic rhinitis   . ASTHMA   . BICUSPID AORTIC VALVE   . CEREBROVASCULAR DISEASE   . DEGENERATIVE JOINT DISEASE   . Diabetes mellitus   . DJD (degenerative joint disease)   . GERD   . HSV-1 (herpes simplex virus 1) infection   . HYPERLIPIDEMIA   . Hypertension   . Palpitation   . Vitamin D deficiency    Health Maintenance  Topic Date Due  . PAP SMEAR-Modifier  07/27/2016  . INFLUENZA VACCINE  09/16/2018  . HEMOGLOBIN A1C  04/07/2019  . OPHTHALMOLOGY EXAM  07/03/2019  . FOOT EXAM  12/25/2019  . MAMMOGRAM  01/12/2020  . COLONOSCOPY  01/27/2025  . PNEUMOCOCCAL POLYSACCHARIDE VACCINE AGE 19-64 HIGH RISK  Completed  . Hepatitis C Screening  Completed  . HIV Screening  Completed   Immunization History  Administered Date(s) Administered  . DT 10/11/2014  . DTaP 03/18/2004  . Influenza Inj Mdck Quad With Preservative 11/17/2016  . Influenza Split 11/15/2013, 11/30/2014  . Influenza Whole 11/02/2012  . Influenza,inj,Quad PF,6+ Mos 09/30/2018  . Influenza-Unspecified 10/31/2015, 10/21/2017  . PPD Test 07/30/2013, 10/11/2014, 11/06/2015, 11/17/2016, 12/12/2017  . Pneumococcal Conjugate-13 11/15/2013, 11/17/2016  . Pneumococcal Polysaccharide-23 05/16/1996  . Zoster Recombinat (Shingrix) 01/02/2018, 04/05/2018, 04/19/2018   Last Colon - 01/28/2015 - Dr Earlean Shawl - Recc 10 yr f/u - due Jan 2027  Last [facility] [place] -   Past Surgical History:  Procedure Laterality Date  . ABDOMINAL HYSTERECTOMY    . repair of trigger finger     Family History  Problem Relation Age of Onset  . Coronary artery disease Mother   . Heart disease Mother   . Hypertension Mother   . Diabetes Father   . Hypertension Father   . Breast cancer Neg Hx    Social History   Tobacco Use  . Smoking status: Never Smoker  . Smokeless tobacco: Never Used  Substance Use  Topics  . Alcohol use: Yes    Comment: very rarely  . Drug use: No    ROS Constitutional: Denies fever, chills, weight loss/gain, headaches, insomnia,  night sweats, and change in appetite. Does c/o fatigue. Eyes: Denies redness, blurred vision, diplopia, discharge, itchy, watery eyes.  ENT: Denies discharge, congestion, post nasal drip, epistaxis, sore throat, earache, hearing loss, dental pain, Tinnitus, Vertigo, Sinus pain, snoring.  Cardio: Denies chest pain, palpitations, irregular heartbeat, syncope, dyspnea, diaphoresis, orthopnea, PND, claudication, edema Respiratory: denies cough, dyspnea, DOE, pleurisy, hoarseness, laryngitis, wheezing.  Gastrointestinal: Denies dysphagia, heartburn, reflux, water brash, pain, cramps, nausea, vomiting, bloating, diarrhea, constipation, hematemesis, melena, hematochezia, jaundice, hemorrhoids Genitourinary: Denies dysuria, frequency, urgency, nocturia, hesitancy, discharge, hematuria, flank pain Breast: Breast lumps, nipple discharge, bleeding.  Musculoskeletal: Denies arthralgia,  myalgia, stiffness, Jt. Swelling, pain, limp, and strain/sprain. Denies falls. Skin: Denies puritis, rash, hives, warts, acne, eczema, changing in skin lesion Neuro: No weakness, tremor, incoordination, spasms, paresthesia, pain Psychiatric: Denies confusion, memory loss, sensory loss. Denies Depression. Endocrine: Denies change in weight, skin, hair change, nocturia, and paresthesia, diabetic polys, visual blurring, hyper / hypo glycemic episodes.  Heme/Lymph: No excessive bleeding, bruising, enlarged lymph nodes.  Physical Exam  BP 118/76   Pulse 72   Temp (!) 97.3 F (36.3 C)   Resp 16   Ht 4\' 10"  (1.473 m)   Wt 123 lb 9.6 oz (56.1 kg)   BMI 25.83 kg/m   General Appearance: Well nourished, well groomed and in no apparent distress.  Eyes: PERRLA, EOMs, conjunctiva no swelling or erythema, normal fundi and vessels. Sinuses: No frontal/maxillary tenderness  ENT/Mouth: EACs patent / TMs  nl. Nares clear without erythema, swelling, mucoid exudates. Oral hygiene is good. No erythema, swelling, or exudate. Tongue normal, non-obstructing. Tonsils not swollen or erythematous. Hearing normal.  Neck: Supple, thyroid not palpable. No bruits, nodes or JVD. Respiratory: Respiratory effort normal.  BS equal and clear bilateral without rales, rhonci, wheezing or stridor. Cardio: Heart sounds are normal with regular rate and rhythm and no murmurs, rubs or gallops. Peripheral pulses are normal and equal bilaterally without edema. No aortic or femoral bruits. Chest: symmetric with normal excursions and percussion. Breasts: Symmetric, without lumps, nipple discharge, retractions, or fibrocystic changes.  Abdomen: Flat, soft with bowel sounds active. Nontender, no guarding, rebound, hernias, masses, or organomegaly.  Lymphatics: Non tender without lymphadenopathy.  Genitourinary:  Musculoskeletal: Full ROM all peripheral extremities, joint stability, 5/5 strength, and normal gait. Skin: Warm and dry without rashes, lesions, cyanosis, clubbing or  ecchymosis.  Neuro: Cranial nerves intact, reflexes equal bilaterally. Normal muscle tone, no cerebellar symptoms. Sensation intact.  Pysch: Alert and oriented X 3, normal affect, Insight and Judgment appropriate.   Assessment and Plan  1. Annual Preventative Screening Examination   2. Essential hypertension  - EKG 12-Lead - [place], retroperitnl abd,  ltd - CBC with Diff - COMPLETE METABOLIC PANEL WITH GFR - Magnesium - TSH  3. Hyperlipidemia associated with type 2 diabetes mellitus (HCC)  - EKG 12-Lead - [place], retroperitnl abd,  ltd - Lipid Profile - TSH  4. Type 2 diabetes mellitus with stage 1 chronic kidney disease,  without long-term current use of insulin (HCC)  - EKG 12-Lead - [place], retroperitnl abd,  ltd - Urinalysis, Routine w reflex microscopic - Microalbumin / Creatinine Urine Ratio - HM DIABETES  FOOT EXAM - LOW EXTREMITY NEUR EXAM DOCUM - Hemoglobin A1c ([facility]) - Insulin, random  5. Vitamin D deficiency  - Vitamin D (25 hydroxy)  6. Screening for colorectal cancer  - POC Hemoccult Bld/Stl  7. Screening for ischemic heart disease  - EKG 12-Lead  8. FHx: heart disease  - EKG 12-Lead - [place], retroperitnl abd,  ltd  9. Screening for AAA (aortic abdominal aneurysm)  - EKG 12-Lead - [place], retroperitnl abd,  ltd  10. Screening examination for pulmonary tuberculosis  - TB Skin Test  11. Fatigue, unspecified type  - Iron,Total/Total Iron Binding Cap - Vitamin B12 - CBC with Diff - TSH  12. Medication management  - Urinalysis, Routine w reflex microscopic - Microalbumin / Creatinine Urine Ratio - CBC with Diff - COMPLETE METABOLIC PANEL WITH GFR - Magnesium - Lipid Profile - TSH - Hemoglobin A1c ([facility]) - Insulin, random - Vitamin D (25 hydroxy)  Patient was counseled in prudent diet to achieve/maintain BMI less than 25 for weight control, BP monitoring, regular exercise and medications. Discussed med's effects and SE's. Screening labs and tests as requested with regular follow-up as recommended. Over 40 minutes of exam, counseling, chart review and high complex critical decision making was performed.   Kirtland Bouchard, MD

## 2018-12-26 ENCOUNTER — Other Ambulatory Visit: Payer: Self-pay | Admitting: Internal Medicine

## 2018-12-26 ENCOUNTER — Encounter: Payer: Self-pay | Admitting: Internal Medicine

## 2018-12-26 ENCOUNTER — Ambulatory Visit: Payer: BC Managed Care – PPO | Admitting: Internal Medicine

## 2018-12-26 ENCOUNTER — Other Ambulatory Visit: Payer: Self-pay

## 2018-12-26 VITALS — BP 118/76 | HR 72 | Temp 97.3°F | Resp 16 | Ht <= 58 in | Wt 123.6 lb

## 2018-12-26 DIAGNOSIS — Z1231 Encounter for screening mammogram for malignant neoplasm of breast: Secondary | ICD-10-CM

## 2018-12-26 DIAGNOSIS — Z1389 Encounter for screening for other disorder: Secondary | ICD-10-CM

## 2018-12-26 DIAGNOSIS — E1122 Type 2 diabetes mellitus with diabetic chronic kidney disease: Secondary | ICD-10-CM

## 2018-12-26 DIAGNOSIS — Z1329 Encounter for screening for other suspected endocrine disorder: Secondary | ICD-10-CM

## 2018-12-26 DIAGNOSIS — I10 Essential (primary) hypertension: Secondary | ICD-10-CM | POA: Diagnosis not present

## 2018-12-26 DIAGNOSIS — Z0000 Encounter for general adult medical examination without abnormal findings: Secondary | ICD-10-CM | POA: Diagnosis not present

## 2018-12-26 DIAGNOSIS — Z111 Encounter for screening for respiratory tuberculosis: Secondary | ICD-10-CM

## 2018-12-26 DIAGNOSIS — Z0001 Encounter for general adult medical examination with abnormal findings: Secondary | ICD-10-CM

## 2018-12-26 DIAGNOSIS — Z79899 Other long term (current) drug therapy: Secondary | ICD-10-CM

## 2018-12-26 DIAGNOSIS — Z13220 Encounter for screening for lipoid disorders: Secondary | ICD-10-CM | POA: Diagnosis not present

## 2018-12-26 DIAGNOSIS — E559 Vitamin D deficiency, unspecified: Secondary | ICD-10-CM | POA: Diagnosis not present

## 2018-12-26 DIAGNOSIS — Z130 Encounter for screening for diseases of the blood and blood-forming organs and certain disorders involving the immune mechanism: Secondary | ICD-10-CM

## 2018-12-26 DIAGNOSIS — E785 Hyperlipidemia, unspecified: Secondary | ICD-10-CM

## 2018-12-26 DIAGNOSIS — E1169 Type 2 diabetes mellitus with other specified complication: Secondary | ICD-10-CM

## 2018-12-26 DIAGNOSIS — Z136 Encounter for screening for cardiovascular disorders: Secondary | ICD-10-CM

## 2018-12-26 DIAGNOSIS — N181 Chronic kidney disease, stage 1: Secondary | ICD-10-CM

## 2018-12-26 DIAGNOSIS — Z8249 Family history of ischemic heart disease and other diseases of the circulatory system: Secondary | ICD-10-CM

## 2018-12-26 DIAGNOSIS — Z131 Encounter for screening for diabetes mellitus: Secondary | ICD-10-CM

## 2018-12-26 DIAGNOSIS — Z1211 Encounter for screening for malignant neoplasm of colon: Secondary | ICD-10-CM

## 2018-12-26 DIAGNOSIS — Z1212 Encounter for screening for malignant neoplasm of rectum: Secondary | ICD-10-CM

## 2018-12-26 DIAGNOSIS — R5383 Other fatigue: Secondary | ICD-10-CM

## 2018-12-27 DIAGNOSIS — Z111 Encounter for screening for respiratory tuberculosis: Secondary | ICD-10-CM

## 2018-12-27 LAB — LIPID PANEL
Cholesterol: 96 mg/dL (ref <200)
HDL: 42 mg/dL — ABNORMAL LOW (ref >=50)
LDL Cholesterol (Calc): 42 mg/dL (calc)
Non-HDL Cholesterol (Calc): 54 mg/dL (calc) (ref <130)
Total CHOL/HDL Ratio: 2.3 (calc) (ref <5.0)
Triglycerides: 42 mg/dL (ref <150)

## 2018-12-27 LAB — URINALYSIS, ROUTINE W REFLEX MICROSCOPIC
Bacteria, UA: NONE SEEN /HPF
Bilirubin Urine: NEGATIVE
Glucose, UA: NEGATIVE
Hgb urine dipstick: NEGATIVE
Hyaline Cast: NONE SEEN /LPF
Ketones, ur: NEGATIVE
Nitrite: NEGATIVE
Protein, ur: NEGATIVE
Specific Gravity, Urine: 1.019 (ref 1.001–1.03)
pH: 7.5 (ref 5.0–8.0)

## 2018-12-27 LAB — CBC WITH DIFFERENTIAL/PLATELET
Absolute Monocytes: 422 cells/uL (ref 200–950)
Basophils Absolute: 43 cells/uL (ref 0–200)
Basophils Relative: 0.7 %
Eosinophils Absolute: 81 cells/uL (ref 15–500)
Eosinophils Relative: 1.3 %
HCT: 39.2 % (ref 35.0–45.0)
Hemoglobin: 12.7 g/dL (ref 11.7–15.5)
Lymphs Abs: 1885 cells/uL (ref 850–3900)
MCH: 31.2 pg (ref 27.0–33.0)
MCHC: 32.4 g/dL (ref 32.0–36.0)
MCV: 96.3 fL (ref 80.0–100.0)
MPV: 11.4 fL (ref 7.5–12.5)
Monocytes Relative: 6.8 %
Neutro Abs: 3770 cells/uL (ref 1500–7800)
Neutrophils Relative %: 60.8 %
Platelets: 279 10*3/uL (ref 140–400)
RBC: 4.07 10*6/uL (ref 3.80–5.10)
RDW: 12.2 % (ref 11.0–15.0)
Total Lymphocyte: 30.4 %
WBC: 6.2 10*3/uL (ref 3.8–10.8)

## 2018-12-27 LAB — COMPLETE METABOLIC PANEL WITH GFR
AG Ratio: 2 (calc) (ref 1.0–2.5)
ALT: 27 U/L (ref 6–29)
AST: 18 U/L (ref 10–35)
Albumin: 4.4 g/dL (ref 3.6–5.1)
Alkaline phosphatase (APISO): 58 U/L (ref 37–153)
BUN/Creatinine Ratio: 39 (calc) — ABNORMAL HIGH (ref 6–22)
BUN: 16 mg/dL (ref 7–25)
CO2: 30 mmol/L (ref 20–32)
Calcium: 9.4 mg/dL (ref 8.6–10.4)
Chloride: 105 mmol/L (ref 98–110)
Creat: 0.41 mg/dL — ABNORMAL LOW (ref 0.50–0.99)
GFR, Est African American: 127 mL/min/{1.73_m2} (ref >=60)
GFR, Est Non African American: 110 mL/min/{1.73_m2} (ref >=60)
Globulin: 2.2 g/dL (calc) (ref 1.9–3.7)
Glucose, Bld: 113 mg/dL — ABNORMAL HIGH (ref 65–99)
Potassium: 5.2 mmol/L (ref 3.5–5.3)
Sodium: 143 mmol/L (ref 135–146)
Total Bilirubin: 0.2 mg/dL (ref 0.2–1.2)
Total Protein: 6.6 g/dL (ref 6.1–8.1)

## 2018-12-27 LAB — TSH: TSH: 0.94 mIU/L (ref 0.40–4.50)

## 2018-12-27 LAB — HEMOGLOBIN A1C
Hgb A1c MFr Bld: 7.7 % of total Hgb — ABNORMAL HIGH (ref <5.7)
Mean Plasma Glucose: 174 (calc)
eAG (mmol/L): 9.7 (calc)

## 2018-12-27 LAB — MICROALBUMIN / CREATININE URINE RATIO
Creatinine, Urine: 87 mg/dL (ref 20–275)
Microalb Creat Ratio: 5 mcg/mg creat (ref <30)
Microalb, Ur: 0.4 mg/dL

## 2018-12-27 LAB — IRON, TOTAL/TOTAL IRON BINDING CAP
%SAT: 19 % (calc) (ref 16–45)
Iron: 55 ug/dL (ref 45–160)
TIBC: 297 mcg/dL (calc) (ref 250–450)

## 2018-12-27 LAB — VITAMIN D 25 HYDROXY (VIT D DEFICIENCY, FRACTURES): Vit D, 25-Hydroxy: 60 ng/mL (ref 30–100)

## 2018-12-27 LAB — MAGNESIUM: Magnesium: 1.8 mg/dL (ref 1.5–2.5)

## 2018-12-27 LAB — VITAMIN B12: Vitamin B-12: 1205 pg/mL — ABNORMAL HIGH (ref 200–1100)

## 2018-12-27 LAB — INSULIN, RANDOM: Insulin: 4.9 u[IU]/mL

## 2018-12-28 ENCOUNTER — Telehealth: Payer: Self-pay | Admitting: *Deleted

## 2018-12-28 LAB — TB SKIN TEST
Induration: 0 mm
TB Skin Test: NEGATIVE

## 2018-12-28 NOTE — Telephone Encounter (Signed)
Patient called and reported she has a salty taste in her mouth all the time.  Patient states she probably does not drink as much as she should.  Per Dr Melford Aase, the patient was advised to increase fluid intake.

## 2019-01-22 ENCOUNTER — Other Ambulatory Visit: Payer: Self-pay | Admitting: Internal Medicine

## 2019-01-22 DIAGNOSIS — Z79899 Other long term (current) drug therapy: Secondary | ICD-10-CM

## 2019-01-22 DIAGNOSIS — M545 Low back pain, unspecified: Secondary | ICD-10-CM

## 2019-01-22 DIAGNOSIS — G8929 Other chronic pain: Secondary | ICD-10-CM

## 2019-01-22 MED ORDER — MELOXICAM 15 MG PO TABS: ORAL_TABLET | ORAL | 3 refills | Status: DC

## 2019-01-22 MED ORDER — CYCLOBENZAPRINE HCL 10 MG PO TABS: ORAL_TABLET | ORAL | 1 refills | Status: DC

## 2019-01-22 MED ORDER — TRAZODONE HCL 150 MG PO TABS: ORAL_TABLET | ORAL | 3 refills | Status: DC

## 2019-01-30 ENCOUNTER — Other Ambulatory Visit: Payer: Self-pay | Admitting: Internal Medicine

## 2019-01-30 DIAGNOSIS — K219 Gastro-esophageal reflux disease without esophagitis: Secondary | ICD-10-CM

## 2019-02-10 NOTE — Progress Notes (Signed)
Cardiology Office Note   Date:  02/12/2019   ID:  [name], DOB 30-Dec-1955, MRN [id number]  PCP:  [name], MD  Cardiologist: Dr. Stanford Breed Chief Complaint  Patient presents with  . Follow-up    1 year   . Edema    hand and feet   History of Present Illness: [name] is a 63 y.o. female who presents for ongoing assessment and management of bicuspid aortic valve and palpitations, with other history to include hyperlipidemia.  The patient has been lost to follow-up since February 2017.    Dr. Stanford Breed noted that the patient had a stress Myoview in 2009 which was negative for ischemia or infarction.  Carotid Doppler studies in March 2012 revealed 0% to 39% bilateral stenosis.  Echocardiogram in 2014 showed normal LV function with no AAS or AI but could not exclude bicuspid aortic valve.  When last seen by Dr. Stanford Breed on 04/11/2015 echocardiogram was planned.  This revealed normal LV systolic function of 60 to 65% with bicuspid aortic valve.  She did  not have abdominal ultrasound.  Per echocardiogram her aortic root was normal in size along with her ascending aorta.  She comes today without any cardiac complaints.  She states she did not follow-up because she did not feel that she needed to as she has been asymptomatic.  She has been followed by her PCP.  They are having a difficult time keeping her blood sugar controlled.  She states that she sometimes feels her heart racing at night when she lays down in bed.  She also admits that she is not very active.  She does not exercise regularly.  She is medically compliant and has had recent labs by her PCP.  She states her hemoglobin A1c is greater than 7 and they are working on her diet.  Past Medical History:  Diagnosis Date  . Allergic rhinitis   . ASTHMA   . BICUSPID AORTIC VALVE   . CEREBROVASCULAR DISEASE   . DEGENERATIVE JOINT DISEASE   . Diabetes mellitus   . DJD (degenerative joint disease)   . GERD    . HSV-1 (herpes simplex virus 1) infection   . HYPERLIPIDEMIA   . Hypertension   . Palpitation   . Vitamin D deficiency     Past Surgical History:  Procedure Laterality Date  . ABDOMINAL HYSTERECTOMY    . repair of trigger finger       Current Outpatient Medications  Medication Sig Dispense Refill  . albuterol (PROVENTIL HFA;VENTOLIN HFA) 108 (90 Base) MCG/ACT inhaler Use 1  To 2 inhalations 5 minutes apart every 4 hours if needed to rescue asthma 48 g 1  . aspirin EC 81 MG tablet Take 81 mg by mouth daily.    . B Complex-C (SUPER B COMPLEX PO) Take 1 tablet by mouth daily. Takes every other day    . BAYER MICROLET LANCETS lancets Check blood sugar 1 time daily-DX-E11.9. 100 each 4  . Cholecalciferol (VITAMIN D PO) Take 2 capsules by mouth daily. Take 4000 units by mouth twice a day    . Cinnamon 500 MG capsule Take 1,000 mg by mouth daily.     . cyclobenzaprine (FLEXERIL) 10 MG tablet Take 1/2 to 1 tablet at Bedtime for Muscle Spasm 90 tablet 1  . Diclofenac Sodium 1.5 % SOLN Place a small amount of gel on the bilateral hands for hand arthritis 150 mL 0  . famotidine (PEPCID) 40 MG tablet TAKE 1 TABLET DAILY  FOR ACID, INDIGESTION AND HEARTBURN 90 tablet 3  . fluconazole (DIFLUCAN) 150 MG tablet Use once for yeast infection as needed 12 tablet 1  . furosemide (LASIX) 20 MG tablet Take 1 tablet (20 mg total) by mouth daily. 90 tablet 1  . gabapentin (NEURONTIN) 100 MG capsule TAKE 1-3 PILLS AT NIGHT (Patient taking differently: as needed. ) 270 capsule 1  . glucose blood (BAYER CONTOUR NEXT TEST) test strip Check blood sugar 1 time daily-DX-E11.9. 100 each 4  . lisinopril-hydrochlorothiazide (ZESTORETIC) 10-12.5 MG tablet Take 1 tablet Daily for BP & Diabetic Kidney Protection 90 tablet 3  . MAGNESIUM PO Take 2 tablets by mouth daily.    . meloxicam (MOBIC) 15 MG tablet Take 1/2 to 1 tablet daily with food for pain &  Inflammation - limit to maximum of 5 days /week 60 tablet 3  .  metFORMIN (GLUCOPHAGE-XR) 500 MG 24 hr tablet Take 2 tablets 2 x /day with Meals for Diabetes 360 tablet 1  . metoprolol succinate (TOPROL-XL) 25 MG 24 hr tablet Take 1 tablet Daily for BP 90 tablet 3  . mometasone (NASONEX) 50 MCG/ACT nasal spray Place 2 sprays into the nose daily. 51 g 3  . Multiple Vitamin (MULITIVITAMIN WITH MINERALS) TABS Take 1 tablet by mouth daily.    [name] OVER THE COUNTER MEDICATION Takes Omega-Q vitamin which contains Tumeric.    . phentermine (ADIPEX-P) 37.5 MG tablet Take 1/2 to 1 tablet every morning for dieting & weightloss 90 tablet 1  . potassium chloride SA (K-DUR) 20 MEQ tablet Take 1 tablet 3 x /day after meals 270 tablet 3  . Psyllium (METAMUCIL PO) Take 1 Dose by mouth daily.    . rosuvastatin (CRESTOR) 40 MG tablet Take 1/2 to 1 tablet daily or as directed for Cholesterol 90 tablet 3  . traZODone (DESYREL) 150 MG tablet Take 1/2 to 1 tablet 1  to 2 hours before Bedtime 90 tablet 3  . triamcinolone cream (KENALOG) 0.1 % Apply 1 application topically 4 (four) times daily as needed. 30 g 0   No current facility-administered medications for this visit.    Allergies:   Naprosyn [naproxen]    Social History:  The patient  reports that she has never smoked. She has never used smokeless tobacco. She reports current alcohol use. She reports that she does not use drugs.   Family History:  The patient's family history includes Coronary artery disease in her mother; Diabetes in her father; Heart disease in her mother; Hypertension in her father and mother.    ROS: All other systems are reviewed and negative. Unless otherwise mentioned in H&P    PHYSICAL EXAM: VS:  BP 138/72   Pulse 88   Temp (!) 97.2 F (36.2 C)   Ht 4\' 10"  (1.473 m)   Wt 124 lb 6.4 oz (56.4 kg)   SpO2 99%   BMI 26.00 kg/m  , BMI Body mass index is 26 kg/m. GEN: Well nourished, well developed, in no acute distress HEENT: normal, eye drooping on the right Neck: no JVD, carotid bruits, or  masses Cardiac: RRR; no murmurs, rubs, or gallops,no edema  Respiratory:  Clear to auscultation bilaterally, normal work of breathing GI: soft, nontender, nondistended, + BS MS: no deformity or atrophy, missing top joint of right index finger. Skin: warm and dry, no rash Neuro:  Strength and sensation are intact Psych: euthymic mood, full affect   EKG: Not completed this office visit Recent Labs: 12/26/2018: ALT 27; BUN  16; Creat 0.41; Hemoglobin 12.7; Magnesium 1.8; Platelets 279; Potassium 5.2; Sodium 143; TSH 0.94    Lipid Panel    Component Value Date/Time   CHOL 96 12/26/2018 1023   TRIG 42 12/26/2018 1023   HDL 42 (L) 12/26/2018 1023   CHOLHDL 2.3 12/26/2018 1023   VLDL 20 09/03/2016 0929   LDLCALC 42 12/26/2018 1023      Wt Readings from Last 3 Encounters:  02/12/19 124 lb 6.4 oz (56.4 kg)  12/26/18 123 lb 9.6 oz (56.1 kg)  10/05/18 124 lb 9.6 oz (56.5 kg)      Other studies Reviewed: Echocardiogram 03-27-20017 Left ventricle: The cavity size was normal. Wall thickness was   normal. Systolic function was normal. The estimated ejection   fraction was in t he range of 60% to 65%. Wall motion was normal;   there were no regional wall motion abnormalities. Features are   consistent with a pseudonormal left ventricular filling pattern,   with concomitant abnormal relaxation and increased filling   pressure (grade 2 diastolic dysfunction). - Aortic valve: Bicuspid. - Left atrium: The atrium was mildly dilated.    ASSESSMENT AND PLAN:  1.  Bicuspid aortic valve: She will have a repeat echocardiogram as one has not been completed in 3 years for ongoing surveillance.  She is without symptoms at this time.  2.  Non-insulin-dependent diabetes: Most recent lab work 1 month ago revealed a hemoglobin A1c of 7.7.  She continues to work with her primary care physician concerning diet and medications.  I have advised her to increase her activity of exercise.  I suggested that  if she watches a certain television show every day that she should include riding her stationary bike during the time she normally watches it.  That way she can combine watching her television show with some mild exercise instead of sitting watching the television show.  She is willing to try this.  3.  Hyperlipidemia: Lab work completed 1 month ago revealed total cholesterol of 96, HDL 42, triglycerides 42, with an  LDL of 42.  Excellent control.  No changes in her medication regimen.  She will continue on rosuvastatin 40 mg 1/2 tablet daily as directed.  4.  Hypertension: Excellent control of blood pressure.  Continue metoprolol, lisinopril HCTZ, with most recent labs revealing creatinine of 0.30-month ago.  Potassium 5.2.  LFTs within normal limits.   Current medicines are reviewed at length with the patient today.    Labs/ tests ordered today include: Echocardiogram   Phill Myron. West Pugh, ANP, AACC   02/12/2019 8:40 AM    [address] Office [phone] Fax [phone]  Notice: This dictation was prepared with Dragon dictation along with smaller phrase technology. Any transcriptional errors that result from this process are unintentional and may not be corrected upon review.

## 2019-02-12 ENCOUNTER — Encounter: Payer: Self-pay | Admitting: Adult Health

## 2019-02-12 ENCOUNTER — Other Ambulatory Visit: Payer: Self-pay

## 2019-02-12 ENCOUNTER — Ambulatory Visit: Payer: BC Managed Care – PPO | Admitting: Adult Health

## 2019-02-12 VITALS — BP 138/72 | HR 88 | Temp 97.2°F | Ht <= 58 in | Wt 124.4 lb

## 2019-02-12 DIAGNOSIS — Q231 Congenital insufficiency of aortic valve: Secondary | ICD-10-CM | POA: Diagnosis not present

## 2019-02-12 DIAGNOSIS — I10 Essential (primary) hypertension: Secondary | ICD-10-CM | POA: Diagnosis not present

## 2019-02-12 DIAGNOSIS — E1169 Type 2 diabetes mellitus with other specified complication: Secondary | ICD-10-CM | POA: Diagnosis not present

## 2019-02-12 DIAGNOSIS — E1122 Type 2 diabetes mellitus with diabetic chronic kidney disease: Secondary | ICD-10-CM | POA: Diagnosis not present

## 2019-02-12 DIAGNOSIS — E785 Hyperlipidemia, unspecified: Secondary | ICD-10-CM

## 2019-02-12 DIAGNOSIS — N181 Chronic kidney disease, stage 1: Secondary | ICD-10-CM

## 2019-02-12 NOTE — Patient Instructions (Signed)
Medication Instructions:  Continue current medications  If you need a refill on your cardiac medications before your next appointment, please call your pharmacy.  Labwork: None Ordered   Testing/Procedures: Your physician has requested that you have an echocardiogram. Echocardiography is a painless test that uses sound waves to create images of your heart. It provides your doctor with information about the size and shape of your heart and how well your heart's chambers and valves are working. This procedure takes approximately one hour. There are no restrictions for this procedure.  PLEASE READ AND FOLLOW SALTY 6 ATTACHED  Reduce your risk of getting COVID-19 With your heart disease it is especially important for people at increased risk of severe illness from COVID-19, and those who live with them, to protect themselves from getting COVID-19. The best way to protect yourself and to help reduce the spread of the virus that causes COVID-19 is to: [name] Limit your interactions with other people as much as possible. . Take precautions to prevent getting COVID-19 when you do interact with others. If you start feeling sick and think you may have COVID-19, get in touch with your healthcare provider within 24 hours.  Follow-Up: IN 12 months Please call our office 2 months in advance, to schedule this appointment. In Person Kirk Ruths, MD.    At [facility], you and your health needs are our priority.  As part of our continuing mission to provide you with exceptional heart care, we have created designated Provider Care Teams.  These Care Teams include your primary Cardiologist (physician) and Advanced Practice Providers (APPs -  Physician Assistants and Nurse Practitioners) who all work together to provide you with the care you need, when you need it.  Thank you for choosing [facility] at [facility]!!     Happy Holidays!!

## 2019-02-15 ENCOUNTER — Other Ambulatory Visit: Payer: Self-pay | Admitting: Internal Medicine

## 2019-02-19 ENCOUNTER — Other Ambulatory Visit: Payer: Self-pay

## 2019-02-19 ENCOUNTER — Ambulatory Visit
Admission: RE | Admit: 2019-02-19 | Discharge: 2019-02-19 | Disposition: A | Discharge status: Home / Self Care | Payer: BLUE CROSS/BLUE SHIELD | Source: Ambulatory Visit | Attending: Internal Medicine | Admitting: Internal Medicine

## 2019-02-19 DIAGNOSIS — Z1231 Encounter for screening mammogram for malignant neoplasm of breast: Secondary | ICD-10-CM

## 2019-02-22 ENCOUNTER — Other Ambulatory Visit: Payer: Self-pay

## 2019-02-22 ENCOUNTER — Ambulatory Visit (HOSPITAL_COMMUNITY): Payer: BC Managed Care – PPO | Attending: Adult Health

## 2019-02-22 DIAGNOSIS — Q231 Congenital insufficiency of aortic valve: Secondary | ICD-10-CM | POA: Insufficient documentation

## 2019-02-22 MED ORDER — PERFLUTREN LIPID MICROSPHERE
1.0000 mL | INTRAVENOUS | Status: AC | PRN
  Administered 2019-02-22: 1 mL via INTRAVENOUS

## 2019-03-19 ENCOUNTER — Other Ambulatory Visit: Payer: Self-pay | Admitting: Internal Medicine

## 2019-03-19 DIAGNOSIS — E119 Type 2 diabetes mellitus without complications: Secondary | ICD-10-CM

## 2019-04-03 ENCOUNTER — Ambulatory Visit: Payer: BC Managed Care – PPO | Admitting: Adult Health

## 2019-04-03 ENCOUNTER — Ambulatory Visit: Payer: BC Managed Care – PPO | Admitting: Adult Health Nurse Practitioner

## 2019-04-03 ENCOUNTER — Encounter: Payer: Self-pay | Admitting: Adult Health Nurse Practitioner

## 2019-04-03 ENCOUNTER — Other Ambulatory Visit: Payer: Self-pay

## 2019-04-03 VITALS — BP 112/64 | HR 77 | Temp 97.0°F | Ht <= 58 in | Wt 121.6 lb

## 2019-04-03 DIAGNOSIS — I10 Essential (primary) hypertension: Secondary | ICD-10-CM

## 2019-04-03 DIAGNOSIS — K219 Gastro-esophageal reflux disease without esophagitis: Secondary | ICD-10-CM

## 2019-04-03 DIAGNOSIS — E559 Vitamin D deficiency, unspecified: Secondary | ICD-10-CM | POA: Diagnosis not present

## 2019-04-03 DIAGNOSIS — E1122 Type 2 diabetes mellitus with diabetic chronic kidney disease: Secondary | ICD-10-CM | POA: Diagnosis not present

## 2019-04-03 DIAGNOSIS — Z6825 Body mass index (BMI) 25.0-25.9, adult: Secondary | ICD-10-CM

## 2019-04-03 DIAGNOSIS — J45909 Unspecified asthma, uncomplicated: Secondary | ICD-10-CM | POA: Diagnosis not present

## 2019-04-03 DIAGNOSIS — E782 Mixed hyperlipidemia: Secondary | ICD-10-CM

## 2019-04-03 DIAGNOSIS — E611 Iron deficiency: Secondary | ICD-10-CM | POA: Diagnosis not present

## 2019-04-03 DIAGNOSIS — Z79899 Other long term (current) drug therapy: Secondary | ICD-10-CM

## 2019-04-03 DIAGNOSIS — B370 Candidal stomatitis: Secondary | ICD-10-CM

## 2019-04-03 DIAGNOSIS — N181 Chronic kidney disease, stage 1: Secondary | ICD-10-CM

## 2019-04-03 DIAGNOSIS — G4726 Circadian rhythm sleep disorder, shift work type: Secondary | ICD-10-CM

## 2019-04-03 MED ORDER — NYSTATIN 100000 UNIT/ML MT SUSP: OROMUCOSAL | 0 refills | Status: DC

## 2019-04-03 NOTE — Patient Instructions (Addendum)
We will contact you in 1-3 days with lab results.  For your mouth:  Use biotene mouth rinse to help with dry mouth. We are going to sent in Nystatin mouth wash for you to use four times a day.  Try Plackers, mouth guard  from [employer] to see if this reduce your teeth grinding and to protect your teeth.  Also mention this to your dentist or ask about alternative mouth guards to protect your teeth.   You can purchase Melatonin '10mg'$  and take 30-37mn before you lay down to sleep.   Preventive Care for Adults  A healthy lifestyle and preventive care can promote health and wellness. Preventive health guidelines for women include the following key practices.  A routine yearly physical is a good way to check with your health care provider about your health and preventive screening. It is a chance to share any concerns and updates on your health and to receive a thorough exam.  Visit your dentist for a routine exam and preventive care every 6 months. Brush your teeth twice a day and floss once a day. Good oral hygiene prevents tooth decay and gum disease.  The frequency of eye exams is based on your age, health, family medical history, use of contact lenses, and other factors. Follow your health care provider's recommendations for frequency of eye exams.  Eat a healthy diet. Foods like vegetables, fruits, whole grains, low-fat dairy products, and lean protein foods contain the nutrients you need without too many calories. Decrease your intake of foods high in solid fats, added sugars, and salt. Eat the right amount of calories for you.Get information about a proper diet from your health care provider, if necessary.  Regular physical exercise is one of the most important things you can do for your health. Most adults should get at least 150 minutes of moderate-intensity exercise (any activity that increases your heart rate and causes you to sweat) each week. In addition, most adults need  muscle-strengthening exercises on 2 or more days a week.  Maintain a healthy weight. The body mass index (BMI) is a screening tool to identify possible weight problems. It provides an estimate of body fat based on height and weight. Your health care provider can find your BMI and can help you achieve or maintain a healthy weight.For adults 20 years and older:  A BMI below 18.5 is considered underweight.  A BMI of 18.5 to 24.9 is normal.  A BMI of 25 to 29.9 is considered overweight.  A BMI of 30 and above is considered obese.  Maintain normal blood lipids and cholesterol levels by exercising and minimizing your intake of saturated fat. Eat a balanced diet with plenty of fruit and vegetables. Blood tests for lipids and cholesterol should begin at age 764and be repeated every 5 years. If your lipid or cholesterol levels are high, you are over 50, or you are at high risk for heart disease, you may need your cholesterol levels checked more frequently.Ongoing high lipid and cholesterol levels should be treated with medicines if diet and exercise are not working.  If you smoke, find out from your health care provider how to quit. If you do not use tobacco, do not start.  Lung cancer screening is recommended for adults aged 539-80years who are at high risk for developing lung cancer because of a history of smoking. A yearly low-dose CT scan of the lungs is recommended for people who have at least a 30-pack-year history of  smoking and are a current smoker or have quit within the past 15 years. A pack year of smoking is smoking an average of 1 pack of cigarettes a day for 1 year (for example: 1 pack a day for 30 years or 2 packs a day for 15 years). Yearly screening should continue until the smoker has stopped smoking for at least 15 years. Yearly screening should be stopped for people who develop a health problem that would prevent them from having lung cancer treatment.  High blood pressure causes heart  disease and increases the risk of stroke. Your blood pressure should be checked at least every 1 to 2 years. Ongoing high blood pressure should be treated with medicines if weight loss and exercise do not work.  If you are 36-4 years old, ask your health care provider if you should take aspirin to prevent strokes.  Diabetes screening involves taking a blood sample to check your fasting blood sugar level. This should be done once every 3 years, after age 71, if you are within normal weight and without risk factors for diabetes. Testing should be considered at a younger age or be carried out more frequently if you are overweight and have at least 1 risk factor for diabetes.  Breast cancer screening is essential preventive care for women. You should practice "breast self-awareness." This means understanding the normal appearance and feel of your breasts and may include breast self-examination. Any changes detected, no matter how small, should be reported to a health care provider. Women in their 12s and 30s should have a clinical breast exam (CBE) by a health care provider as part of a regular health exam every 1 to 3 years. After age 5, women should have a CBE every year. Starting at age 53, women should consider having a mammogram (breast X-ray test) every year. Women who have a family history of breast cancer should talk to their health care provider about genetic screening. Women at a high risk of breast cancer should talk to their health care providers about having an MRI and a mammogram every year.  Breast cancer gene (BRCA)-related cancer risk assessment is recommended for women who have family members with BRCA-related cancers. BRCA-related cancers include breast, ovarian, tubal, and peritoneal cancers. Having family members with these cancers may be associated with an increased risk for harmful changes (mutations) in the breast cancer genes BRCA1 and BRCA2. Results of the assessment will determine  the need for genetic counseling and BRCA1 and BRCA2 testing.  Routine pelvic exams to screen for cancer are no longer recommended for nonpregnant women who are considered low risk for cancer of the pelvic organs (ovaries, uterus, and vagina) and who do not have symptoms. Ask your health care provider if a screening pelvic exam is right for you.  If you have had past treatment for cervical cancer or a condition that could lead to cancer, you need Pap tests and screening for cancer for at least 20 years after your treatment. If Pap tests have been discontinued, your risk factors (such as having a new sexual partner) need to be reassessed to determine if screening should be resumed. Some women have medical problems that increase the chance of getting cervical cancer. In these cases, your health care provider may recommend more frequent screening and Pap tests.  Colorectal cancer can be detected and often prevented. Most routine colorectal cancer screening begins at the age of 46 years and continues through age 27 years. However, your health care provider  may recommend screening at an earlier age if you have risk factors for colon cancer. On a yearly basis, your health care provider may provide home test kits to check for hidden blood in the stool. Use of a small camera at the end of a tube, to directly examine the colon (sigmoidoscopy or colonoscopy), can detect the earliest forms of colorectal cancer. Talk to your health care provider about this at age 65, when routine screening begins. Direct exam of the colon should be repeated every 5-10 years through age 69 years, unless early forms of pre-cancerous polyps or small growths are found.  Hepatitis C blood testing is recommended for all people born from [birth year] through 1965 and any individual with known risks for hepatitis C.  Pra  Osteoporosis is a disease in which the bones lose minerals and strength with aging. This can result in serious bone fractures or  breaks. The risk of osteoporosis can be identified using a bone density scan. Women ages 50 years and over and women at risk for fractures or osteoporosis should discuss screening with their health care providers. Ask your health care provider whether you should take a calcium supplement or vitamin D to reduce the rate of osteoporosis.  Menopause can be associated with physical symptoms and risks. Hormone replacement therapy is available to decrease symptoms and risks. You should talk to your health care provider about whether hormone replacement therapy is right for you.  Use sunscreen. Apply sunscreen liberally and repeatedly throughout the day. You should seek shade when your shadow is shorter than you. Protect yourself by wearing long sleeves, pants, a wide-brimmed hat, and sunglasses year round, whenever you are outdoors.  Once a month, do a whole body skin exam, using a mirror to look at the skin on your back. Tell your health care provider of new moles, moles that have irregular borders, moles that are larger than a pencil eraser, or moles that have changed in shape or color.  Stay current with required vaccines (immunizations).  Influenza vaccine. All adults should be immunized every year.  Tetanus, diphtheria, and acellular pertussis (Td, Tdap) vaccine. Pregnant women should receive 1 dose of Tdap vaccine during each pregnancy. The dose should be obtained regardless of the length of time since the last dose. Immunization is preferred during the 27th-36th week of gestation. An adult who has not previously received Tdap or who does not know her vaccine status should receive 1 dose of Tdap. This initial dose should be followed by tetanus and diphtheria toxoids (Td) booster doses every 10 years. Adults with an unknown or incomplete history of completing a 3-dose immunization series with Td-containing vaccines should begin or complete a primary immunization series including a Tdap dose. Adults should  receive a Td booster every 10 years.  Varicella vaccine. An adult without evidence of immunity to varicella should receive 2 doses or a second dose if she has previously received 1 dose. Pregnant females who do not have evidence of immunity should receive the first dose after pregnancy. This first dose should be obtained before leaving the health care facility. The second dose should be obtained 4-8 weeks after the first dose.  Human papillomavirus (HPV) vaccine. Females aged 13-26 years who have not received the vaccine previously should obtain the 3-dose series. The vaccine is not recommended for use in pregnant females. However, pregnancy testing is not needed before receiving a dose. If a female is found to be pregnant after receiving a dose, no treatment is needed.  In that case, the remaining doses should be delayed until after the pregnancy. Immunization is recommended for any person with an immunocompromised condition through the age of 65 years if she did not get any or all doses earlier. During the 3-dose series, the second dose should be obtained 4-8 weeks after the first dose. The third dose should be obtained 24 weeks after the first dose and 16 weeks after the second dose.  Zoster vaccine. One dose is recommended for adults aged 37 years or older unless certain conditions are present.  Measles, mumps, and rubella (MMR) vaccine. Adults born before [birth year] generally are considered immune to measles and mumps. Adults born in [birth year] or later should have 1 or more doses of MMR vaccine unless there is a contraindication to the vaccine or there is laboratory evidence of immunity to each of the three diseases. A routine second dose of MMR vaccine should be obtained at least 28 days after the first dose for students attending postsecondary schools, health care workers, or international travelers. People who received inactivated measles vaccine or an unknown type of measles vaccine during 1963-1967 should  receive 2 doses of MMR vaccine. People who received inactivated mumps vaccine or an unknown type of mumps vaccine before 1979 and are at high risk for mumps infection should consider immunization with 2 doses of MMR vaccine. For females of childbearing age, rubella immunity should be determined. If there is no evidence of immunity, females who are not pregnant should be vaccinated. If there is no evidence of immunity, females who are pregnant should delay immunization until after pregnancy. Unvaccinated health care workers born before [birth year] who lack laboratory evidence of measles, mumps, or rubella immunity or laboratory confirmation of disease should consider measles and mumps immunization with 2 doses of MMR vaccine or rubella immunization with 1 dose of MMR vaccine.  Pneumococcal 13-valent conjugate (PCV13) vaccine. When indicated, a person who is uncertain of her immunization history and has no record of immunization should receive the PCV13 vaccine. An adult aged 5 years or older who has certain medical conditions and has not been previously immunized should receive 1 dose of PCV13 vaccine. This PCV13 should be followed with a dose of pneumococcal polysaccharide (PPSV23) vaccine. The PPSV23 vaccine dose should be obtained at least 8 weeks after the dose of PCV13 vaccine. An adult aged 67 years or older who has certain medical conditions and previously received 1 or more doses of PPSV23 vaccine should receive 1 dose of PCV13. The PCV13 vaccine dose should be obtained 1 or more years after the last PPSV23 vaccine dose.    Pneumococcal polysaccharide (PPSV23) vaccine. When PCV13 is also indicated, PCV13 should be obtained first. All adults aged 62 years and older should be immunized. An adult younger than age 73 years who has certain medical conditions should be immunized. Any person who resides in a nursing home or long-term care facility should be immunized. An adult smoker should be immunized. People with  an immunocompromised condition and certain other conditions should receive both PCV13 and PPSV23 vaccines. People with human immunodeficiency virus (HIV) infection should be immunized as soon as possible after diagnosis. Immunization during chemotherapy or radiation therapy should be avoided. Routine use of PPSV23 vaccine is not recommended for American Indians, [place] Natives, or people younger than 65 years unless there are medical conditions that require PPSV23 vaccine. When indicated, people who have unknown immunization and have no record of immunization should receive PPSV23 vaccine. One-time revaccination 5 years after  the first dose of PPSV23 is recommended for people aged 19-64 years who have chronic kidney failure, nephrotic syndrome, asplenia, or immunocompromised conditions. People who received 1-2 doses of PPSV23 before age 26 years should receive another dose of PPSV23 vaccine at age 29 years or later if at least 5 years have passed since the previous dose. Doses of PPSV23 are not needed for people immunized with PPSV23 at or after age 75 years.  Preventive Services / Frequency   Ages 58 to 45 years  Blood pressure check.  Lipid and cholesterol check.  Lung cancer screening. / Every year if you are aged 73-80 years and have a 30-pack-year history of smoking and currently smoke or have quit within the past 15 years. Yearly screening is stopped once you have quit smoking for at least 15 years or develop a health problem that would prevent you from having lung cancer treatment.  Clinical breast exam.** / Every year after age 23 years.  BRCA-related cancer risk assessment.** / For women who have family members with a BRCA-related cancer (breast, ovarian, tubal, or peritoneal cancers).  Mammogram.** / Every year beginning at age 37 years and continuing for as long as you are in good health. Consult with your health care provider.  Pap test.** / Every 3 years starting at age 38 years  through age 49 or 18 years with a history of 3 consecutive normal Pap tests.  HPV screening.** / Every 3 years from ages 55 years through ages 65 to 4 years with a history of 3 consecutive normal Pap tests.  Fecal occult blood test (FOBT) of stool. / Every year beginning at age 70 years and continuing until age 43 years. You may not need to do this test if you get a colonoscopy every 10 years.  Flexible sigmoidoscopy or colonoscopy.** / Every 5 years for a flexible sigmoidoscopy or every 10 years for a colonoscopy beginning at age 61 years and continuing until age 57 years.  Hepatitis C blood test.** / For all people born from [birth year] through 1965 and any individual with known risks for hepatitis C.  Skin self-exam. / Monthly.  Influenza vaccine. / Every year.  Tetanus, diphtheria, and acellular pertussis (Tdap/Td) vaccine.** / Consult your health care provider. Pregnant women should receive 1 dose of Tdap vaccine during each pregnancy. 1 dose of Td every 10 years.  Varicella vaccine.** / Consult your health care provider. Pregnant females who do not have evidence of immunity should receive the first dose after pregnancy.  Zoster vaccine.** / 1 dose for adults aged 95 years or older.  Pneumococcal 13-valent conjugate (PCV13) vaccine.** / Consult your health care provider.  Pneumococcal polysaccharide (PPSV23) vaccine.** / 1 to 2 doses if you smoke cigarettes or if you have certain conditions.  Meningococcal vaccine.** / Consult your health care provider.  Hepatitis A vaccine.** / Consult your health care provider.  Hepatitis B vaccine.** / Consult your health care provider. Screening for abdominal aortic aneurysm (AAA)  by ultrasound is recommended for people over 50 who have history of high blood pressure or who are current or former smokers.

## 2019-04-03 NOTE — Progress Notes (Signed)
FOLLOW UP  Assessment and Plan:    Jariana was seen today for follow-up.  Diagnoses and all orders for this visit:  Essential hypertension Continue current medications: lisinopril hctz 10mg /12.5mg , metoprolol 25mg  daily and lasix 20mg  daily  Monitor blood pressure at home; call if consistently over 130/80 Continue DASH diet.   Reminder to go to the ER if any CP, SOB, nausea, dizziness, severe HA, changes vision/speech, left arm numbness and tingling and jaw pain. -     CBC with Differential/Platelet -     COMPLETE METABOLIC PANEL WITH GFR -     Magnesium -     TSH  Type 2 diabetes mellitus with stage 1 chronic kidney disease, without long-term current use of insulin (HCC) Continue medications: Metformin XR 500mg , two tablets BID. Discussed general issues about diabetes pathophysiology and management. Education: Reviewed 'ABCs' of diabetes management (respective goals in parentheses):  A1C (<7), blood pressure (<130/80), and cholesterol (LDL <70) Dietary recommendations Encouraged aerobic exercise.  Discussed foot care, check daily Yearly retinal exam Dental exam every 6 months, DUE Monitor blood glucose, discussed goal for patient -     Hemoglobin A1c  Gastroesophageal reflux disease, unspecified whether esophagitis present Doing well at this time Continue: Famotidine 40mg  Diet discussed Monitor for triggers Avoid food with high acid content Avoid excessive cafeine Increase water intake -     Magnesium  Uncomplicated asthma, unspecified asthma severity, unspecified whether persistent Doing well at this time Has albuterol inhaler in date, no current use.  Hyperlipidemia, mixed Continue medications: Rosuvastatin 40mg  nightly Discussed dietary and exercise modifications Low fat diet -     Lipid panel  Vitamin D deficiency Continue supplementation  -     VITAMIN D 25 Hydroxy (Vit-D Deficiency, Fractures)  Iron deficiency Continue iron supplementation -      Iron,Total/Total Iron Binding Cap  Sleep disorder, shift-work Takes trazodone 150mg , half tablet nightly, makes drowsy next day Discussed OTC Melatonin 10mg  nightly Discussed good sleep hygiene, decrease stimulation prior to sleep Increase day time activity Avoid caffeine in evenings  Thrush -     nystatin (MYCOSTATIN) 100000 UNIT/ML suspension; 5 ml four times a day, retain in mouth as long as possible (Swish and Spit).  Use for 48 hours after symptoms resolve.  BMI 25.0-25.9 Discussed dietary and exercise modifications  Medication management Continued   Continue diet and meds as discussed. Further disposition pending results of labs. Discussed med's effects and SE's.   Over 30 minutes of exam, counseling, chart review, and critical decision making was performed.   Future Appointments  Date Time Provider [place]  07/11/2019 10:30 AM [name], MD [facility] None  01/08/2020 11:00 AM [name], MD [facility] None    ----------------------------------------------------------------------------------------------------------------------  HPI 64 y.o. female  presents for 3 month follow up on HTN, HLD, DMII, weight and vitamin D deficiency.   She is followed by cardiology and last OV was 01/2019 with Myoview that was unremarkable.  showing mild disease only, with recommendation to continue ASA and rosuvastatin 40mg  half tablet.  She is prescribed phentermine for weight loss and for somnolence due to shift work, she reports taking 1/2 tab prior to work 5 days week, doesn't take on weekends.   She reports that has starting having discomfort and white patches to the inside of her mouth.  She reports this started two weeks ago.  Denies any difficulty eating or drinking.  Denies any inhaler use, steroids or changes to her medications.  She works from [employer] ,  taking the trazodone 150 1/2 at night that is helping some but does not like the way it makes her feel in  the morning and she does not stay asleep.  She does not take a whole tablet as she needs to function the next day.  She is on an iron supplement 65mg  BID.  She denies any upset stomach or constipation.  She is also taking Vitamin C with this to increase absorption.  Lab Results  Component Value Date   IRON 55 12/26/2018   TIBC 297 12/26/2018   FERRITIN 79 10/05/2018   BMI is Body mass index is 25.41 kg/m., she is working on diet and exercise. Wt Readings from Last 3 Encounters:  04/03/19 121 lb 9.6 oz (55.2 kg)  02/12/19 124 lb 6.4 oz (56.4 kg)  12/26/18 123 lb 9.6 oz (56.1 kg)   She has not been checking her BP at home, today their BP is BP: 112/64  She does not workout but reports she is up on her feet and moving during her hours of work.   She denies chest pain, shortness of breath, dizziness.   She is on cholesterol medication on rouvastatin and denies myalgias. Her cholesterol is not at goal. The cholesterol last visit was:   Lab Results  Component Value Date   CHOL 96 12/26/2018   HDL 42 (L) 12/26/2018   LDLCALC 42 12/26/2018   TRIG 42 12/26/2018   CHOLHDL 2.3 12/26/2018    She has not been working on diet and exercise for T2 diabetes With hyperlipidemia on crestor CAD on ASA  no CKD She is on ACE  on metformin, Cinnamon Could not tolerate invokana.   Reports that she has not been doing very well with her diet and has been eating quite a bit of fruit. denies increased appetite, nausea, paresthesia of the feet, polydipsia, polyuria, visual disturbances and vomiting. She reports she has not been checking her sugars despite being advised to do so. She denies any symptoms of hypoglycemia. Last A1C in the office was:  Lab Results  Component Value Date   HGBA1C 7.7 (H) 12/26/2018   Lab Results  Component Value Date   GFRAA 127 12/26/2018   Patient is on Vitamin D supplement and above goal at the last check:    Lab Results  Component Value Date   VD25OH 60 12/26/2018         Current Medications:  Current Outpatient Medications on File Prior to Visit  Medication Sig  . albuterol (PROVENTIL HFA;VENTOLIN HFA) 108 (90 Base) MCG/ACT inhaler Use 1  To 2 inhalations 5 minutes apart every 4 hours if needed to rescue asthma  . aspirin EC 81 MG tablet Take 81 mg by mouth daily.  . B Complex-C (SUPER B COMPLEX PO) Take 1 tablet by mouth daily. Takes every other day  . BAYER MICROLET LANCETS lancets Check blood sugar 1 time daily-DX-E11.9.  [name] Cholecalciferol (VITAMIN D PO) Take 2 capsules by mouth daily. Take 4000 units by mouth twice a day  . Cinnamon 500 MG capsule Take 1,000 mg by mouth daily.   . cyclobenzaprine (FLEXERIL) 10 MG tablet Take 1/2 to 1 tablet at Bedtime for Muscle Spasm  . Diclofenac Sodium 1.5 % SOLN Place a small amount of gel on the bilateral hands for hand arthritis  . famotidine (PEPCID) 40 MG tablet TAKE 1 TABLET DAILY FOR ACID, INDIGESTION AND HEARTBURN  . fluconazole (DIFLUCAN) 150 MG tablet Use once for yeast infection as needed  .  furosemide (LASIX) 20 MG tablet Take 1 tablet (20 mg total) by mouth daily.  [name] gabapentin (NEURONTIN) 100 MG capsule TAKE 1-3 PILLS AT NIGHT (Patient taking differently: as needed. )  . glucose blood (BAYER CONTOUR NEXT TEST) test strip Check blood sugar 1 time daily-DX-E11.9.  . lisinopril-hydrochlorothiazide (ZESTORETIC) 10-12.5 MG tablet Take 1 tablet Daily for BP & Diabetic Kidney Protection  . MAGNESIUM PO Take 2 tablets by mouth daily.  . meloxicam (MOBIC) 15 MG tablet Take 1/2 to 1 tablet daily with food for pain &  Inflammation - limit to maximum of 5 days /week  . metFORMIN (GLUCOPHAGE-XR) 500 MG 24 hr tablet Take 2 tablets 2 x /day with Meals for Diabetes  . metoprolol succinate (TOPROL-XL) 25 MG 24 hr tablet Take 1 tablet Daily for BP  . mometasone (NASONEX) 50 MCG/ACT nasal spray Place 2 sprays into the nose daily.  . Multiple Vitamin (MULITIVITAMIN WITH MINERALS) TABS Take 1 tablet by mouth daily.  [name]  OVER THE COUNTER MEDICATION Takes Omega-Q vitamin which contains Tumeric.  . phentermine (ADIPEX-P) 37.5 MG tablet Take 1/2 to 1 tablet every morning for dieting & weightloss  . potassium chloride SA (K-DUR) 20 MEQ tablet Take 1 tablet 3 x /day after meals  . Psyllium (METAMUCIL PO) Take 1 Dose by mouth daily.  . rosuvastatin (CRESTOR) 40 MG tablet TAKE ONE-HALF TO ONE TABLET DAILY OR AS DIRECTED FOR CHOLESTEROL  . traZODone (DESYREL) 150 MG tablet Take 1/2 to 1 tablet 1  to 2 hours before Bedtime  . triamcinolone cream (KENALOG) 0.1 % Apply 1 application topically 4 (four) times daily as needed.   No current facility-administered medications on file prior to visit.     Allergies:  Allergies  Allergen Reactions  . Naprosyn [Naproxen] Other (See Comments)    GI upset     Medical History:  Past Medical History:  Diagnosis Date  . Allergic rhinitis   . ASTHMA   . BICUSPID AORTIC VALVE   . CEREBROVASCULAR DISEASE   . DEGENERATIVE JOINT DISEASE   . Diabetes mellitus   . DJD (degenerative joint disease)   . GERD   . HSV-1 (herpes simplex virus 1) infection   . HYPERLIPIDEMIA   . Hypertension   . Palpitation   . Vitamin D deficiency    Family history- Reviewed and unchanged Social history- Reviewed and unchanged   Review of Systems:  Review of Systems  Constitutional: Negative for malaise/fatigue (daytime fatigue/somnolensce ) and weight loss.  HENT: Negative for hearing loss and tinnitus.   Eyes: Negative for blurred vision and double vision.  Respiratory: Negative for cough, shortness of breath and wheezing.   Cardiovascular: Negative for chest pain, palpitations, orthopnea, claudication and leg swelling.  Gastrointestinal: Negative for abdominal pain, blood in stool, constipation, diarrhea, heartburn, melena, nausea and vomiting.  Genitourinary: Negative.   Musculoskeletal: Negative for falls, joint pain, myalgias and neck pain.  Skin: Negative for rash.  Neurological:  Negative for dizziness, tingling, sensory change, weakness and headaches.  Endo/Heme/Allergies: Negative for polydipsia.  Psychiatric/Behavioral: Negative for depression and substance abuse. The patient is not nervous/anxious and does not have insomnia.   All other systems reviewed and are negative.   Physical Exam: BP 112/64   Pulse 77   Temp (!) 97 F (36.1 C)   Ht 4\' 10"  (1.473 m)   Wt 121 lb 9.6 oz (55.2 kg)   SpO2 96%   BMI 25.41 kg/m  Wt Readings from Last 3 Encounters:  04/03/19  121 lb 9.6 oz (55.2 kg)  02/12/19 124 lb 6.4 oz (56.4 kg)  12/26/18 123 lb 9.6 oz (56.1 kg)   General Appearance: Well nourished, in no apparent distress. Eyes: PERRLA, Right eye, deviated gaze (perminate, baseline) conjunctiva no swelling or erythema Sinuses: No Frontal/maxillary tenderness ENT/Mouth: Ext aud canals clear, TMs without erythema, bulging. No erythema, swelling, or exudate on post pharynx.  Tonsils not swollen or erythematous.  White patches to tongue with indentations to bilateral buccal mucosa.  Hearing normal.  Neck: Supple, thyroid normal.  Respiratory: Respiratory effort normal, BS equal bilaterally without rales, rhonchi, wheezing or stridor.  Cardio: RRR with no MRGs. Brisk peripheral pulses without edema.  Abdomen: Soft, + BS.  Non tender, no guarding, rebound, hernias, masses. Lymphatics: Non tender without lymphadenopathy.  Musculoskeletal: Full ROM, no laxity or crepitus to bilateral knees, 5/5 strength, Normal gait Skin: Warm, dry without rashes, lesions, ecchymosis.  Neuro: Cranial nerves intact. No cerebellar symptoms.  Psych: Awake and oriented X 3, normal affect, Insight and Judgment appropriate.    Garnet Sierras, NP 11:51 AM [facility]

## 2019-04-04 LAB — CBC WITH DIFFERENTIAL/PLATELET
Absolute Monocytes: 456 cells/uL (ref 200–950)
Basophils Absolute: 48 cells/uL (ref 0–200)
Basophils Relative: 0.7 %
Eosinophils Absolute: 61 cells/uL (ref 15–500)
Eosinophils Relative: 0.9 %
HCT: 44.2 % (ref 35.0–45.0)
Hemoglobin: 14.1 g/dL (ref 11.7–15.5)
Lymphs Abs: 2013 cells/uL (ref 850–3900)
MCH: 30.6 pg (ref 27.0–33.0)
MCHC: 31.9 g/dL — ABNORMAL LOW (ref 32.0–36.0)
MCV: 95.9 fL (ref 80.0–100.0)
MPV: 11.8 fL (ref 7.5–12.5)
Monocytes Relative: 6.7 %
Neutro Abs: 4223 cells/uL (ref 1500–7800)
Neutrophils Relative %: 62.1 %
Platelets: 258 10*3/uL (ref 140–400)
RBC: 4.61 10*6/uL (ref 3.80–5.10)
RDW: 12.3 % (ref 11.0–15.0)
Total Lymphocyte: 29.6 %
WBC: 6.8 10*3/uL (ref 3.8–10.8)

## 2019-04-04 LAB — COMPLETE METABOLIC PANEL WITH GFR
AG Ratio: 1.7 (calc) (ref 1.0–2.5)
ALT: 23 U/L (ref 6–29)
AST: 21 U/L (ref 10–35)
Albumin: 4.6 g/dL (ref 3.6–5.1)
Alkaline phosphatase (APISO): 66 U/L (ref 37–153)
BUN: 18 mg/dL (ref 7–25)
CO2: 30 mmol/L (ref 20–32)
Calcium: 10.2 mg/dL (ref 8.6–10.4)
Chloride: 101 mmol/L (ref 98–110)
Creat: 0.57 mg/dL (ref 0.50–0.99)
GFR, Est African American: 114 mL/min/{1.73_m2} (ref >=60)
GFR, Est Non African American: 99 mL/min/{1.73_m2} (ref >=60)
Globulin: 2.7 g/dL (calc) (ref 1.9–3.7)
Glucose, Bld: 105 mg/dL — ABNORMAL HIGH (ref 65–99)
Potassium: 4.7 mmol/L (ref 3.5–5.3)
Sodium: 141 mmol/L (ref 135–146)
Total Bilirubin: 0.3 mg/dL (ref 0.2–1.2)
Total Protein: 7.3 g/dL (ref 6.1–8.1)

## 2019-04-04 LAB — LIPID PANEL
Cholesterol: 115 mg/dL (ref <200)
HDL: 45 mg/dL — ABNORMAL LOW (ref >=50)
LDL Cholesterol (Calc): 57 mg/dL (calc)
Non-HDL Cholesterol (Calc): 70 mg/dL (calc) (ref <130)
Total CHOL/HDL Ratio: 2.6 (calc) (ref <5.0)
Triglycerides: 54 mg/dL (ref <150)

## 2019-04-04 LAB — IRON, TOTAL/TOTAL IRON BINDING CAP
%SAT: 22 % (calc) (ref 16–45)
Iron: 67 ug/dL (ref 45–160)
TIBC: 306 mcg/dL (calc) (ref 250–450)

## 2019-04-04 LAB — VITAMIN D 25 HYDROXY (VIT D DEFICIENCY, FRACTURES): Vit D, 25-Hydroxy: 62 ng/mL (ref 30–100)

## 2019-04-04 LAB — HEMOGLOBIN A1C
Hgb A1c MFr Bld: 7.2 % of total Hgb — ABNORMAL HIGH (ref <5.7)
Mean Plasma Glucose: 160 (calc)
eAG (mmol/L): 8.9 (calc)

## 2019-04-04 LAB — MAGNESIUM: Magnesium: 2 mg/dL (ref 1.5–2.5)

## 2019-04-04 LAB — TSH: TSH: 0.91 mIU/L (ref 0.40–4.50)

## 2019-04-15 ENCOUNTER — Ambulatory Visit: Payer: BC Managed Care – PPO | Attending: Internal Medicine

## 2019-04-15 DIAGNOSIS — Z23 Encounter for immunization: Secondary | ICD-10-CM

## 2019-04-15 NOTE — Progress Notes (Signed)
   Covid-19 Vaccination Clinic  Name:  [name]    MRN: [id number] DOB: November 11, 1955  04/15/2019  [name] was observed post Covid-19 immunization for 15 minutes without incidence. She was provided with Vaccine Information Sheet and instruction to access the V-Safe system.   [name] was instructed to call 911 with any severe reactions post vaccine: [name] Difficulty breathing  . Swelling of your face and throat  . A fast heartbeat  . A bad rash all over your body  . Dizziness and weakness    Immunizations Administered    Name Date Dose VIS Date Route   Moderna COVID-19 Vaccine 04/15/2019  3:37 PM 0.5 mL 01/16/2019 Intramuscular   Manufacturer: Moderna   Lot: [id number]   [id number]

## 2019-05-02 ENCOUNTER — Other Ambulatory Visit: Payer: Self-pay

## 2019-05-02 ENCOUNTER — Ambulatory Visit: Payer: BC Managed Care – PPO | Admitting: Physician Assistant

## 2019-05-02 ENCOUNTER — Encounter: Payer: Self-pay | Admitting: Physician Assistant

## 2019-05-02 VITALS — BP 122/68 | HR 96 | Temp 97.3°F | Resp 12 | Wt 124.0 lb

## 2019-05-02 DIAGNOSIS — Z79899 Other long term (current) drug therapy: Secondary | ICD-10-CM | POA: Diagnosis not present

## 2019-05-02 DIAGNOSIS — M79604 Pain in right leg: Secondary | ICD-10-CM | POA: Diagnosis not present

## 2019-05-02 DIAGNOSIS — Z130 Encounter for screening for diseases of the blood and blood-forming organs and certain disorders involving the immune mechanism: Secondary | ICD-10-CM

## 2019-05-02 DIAGNOSIS — M79605 Pain in left leg: Secondary | ICD-10-CM

## 2019-05-02 NOTE — Progress Notes (Signed)
Subjective:    Patient ID: [name], female    DOB: [birth year], 64 y.o.   MRN: [id number]  HPI 64 y.o. AAF with history of CVA, asthma, bicuspid aortic valve, cho, DM2, HTN presents with pain in her legs.   She has been having pain, sharp quick pain between her ankle and knee, bilaterally. Last for seconds. Can happen day or night. Nothing makes it worse, mainly with rest/when she is   Has increase mag, potassium, vitamin D.   She is on lasix, she is on crestor.She is not on gabapentin x 3-6 months.  Lab Results  Component Value Date   CREATININE 0.57 04/03/2019   BUN 18 04/03/2019   NA 141 04/03/2019   K 4.7 04/03/2019   CL 101 04/03/2019   CO2 30 04/03/2019    Blood pressure 122/68, pulse 96, temperature (!) 97.3 F (36.3 C), resp. rate 12, weight 124 lb (56.2 kg), SpO2 98 %.  Medications  Current Outpatient Medications (Endocrine & Metabolic):  .  metFORMIN (GLUCOPHAGE-XR) 500 MG 24 hr tablet, Take 2 tablets 2 x /day with Meals for Diabetes  Current Outpatient Medications (Cardiovascular):  .  furosemide (LASIX) 20 MG tablet, Take 1 tablet (20 mg total) by mouth daily. [name]  lisinopril-hydrochlorothiazide (ZESTORETIC) 10-12.5 MG tablet, Take 1 tablet Daily for BP & Diabetic Kidney Protection .  metoprolol succinate (TOPROL-XL) 25 MG 24 hr tablet, Take 1 tablet Daily for BP .  rosuvastatin (CRESTOR) 40 MG tablet, TAKE ONE-HALF TO ONE TABLET DAILY OR AS DIRECTED FOR CHOLESTEROL  Current Outpatient Medications (Respiratory):  .  albuterol (PROVENTIL HFA;VENTOLIN HFA) 108 (90 Base) MCG/ACT inhaler, Use 1  To 2 inhalations 5 minutes apart every 4 hours if needed to rescue asthma .  mometasone (NASONEX) 50 MCG/ACT nasal spray, Place 2 sprays into the nose daily.  Current Outpatient Medications (Analgesics):  .  aspirin EC 81 MG tablet, Take 81 mg by mouth daily. .  meloxicam (MOBIC) 15 MG tablet, Take 1/2 to 1 tablet daily with food for pain &  Inflammation - limit  to maximum of 5 days /week   Current Outpatient Medications (Other):  [name]  B Complex-C (SUPER B COMPLEX PO), Take 1 tablet by mouth daily. Takes every other day .  BAYER MICROLET LANCETS lancets, Check blood sugar 1 time daily-DX-E11.9. [name]  Cholecalciferol (VITAMIN D PO), Take 2 capsules by mouth daily. Take 4000 units by mouth twice a day .  Cinnamon 500 MG capsule, Take 1,000 mg by mouth daily.  .  cyclobenzaprine (FLEXERIL) 10 MG tablet, Take 1/2 to 1 tablet at Bedtime for Muscle Spasm .  Diclofenac Sodium 1.5 % SOLN, Place a small amount of gel on the bilateral hands for hand arthritis .  famotidine (PEPCID) 40 MG tablet, TAKE 1 TABLET DAILY FOR ACID, INDIGESTION AND HEARTBURN .  fluconazole (DIFLUCAN) 150 MG tablet, Use once for yeast infection as needed .  gabapentin (NEURONTIN) 100 MG capsule, TAKE 1-3 PILLS AT NIGHT (Patient taking differently: as needed. ) .  glucose blood (BAYER CONTOUR NEXT TEST) test strip, Check blood sugar 1 time daily-DX-E11.9. .  MAGNESIUM PO, Take 2 tablets by mouth daily. .  Multiple Vitamin (MULITIVITAMIN WITH MINERALS) TABS, Take 1 tablet by mouth daily. [name]  nystatin (MYCOSTATIN) 100000 UNIT/ML suspension, 5 ml four times a day, retain in mouth as long as possible (Swish and Spit).  Use for 48 hours after symptoms resolve. [name]  OVER THE COUNTER MEDICATION, Takes Omega-Q vitamin which contains  Tumeric. .  phentermine (ADIPEX-P) 37.5 MG tablet, Take 1/2 to 1 tablet every morning for dieting & weightloss .  potassium chloride SA (K-DUR) 20 MEQ tablet, Take 1 tablet 3 x /day after meals .  Psyllium (METAMUCIL PO), Take 1 Dose by mouth daily. .  traZODone (DESYREL) 150 MG tablet, Take 1/2 to 1 tablet 1  to 2 hours before Bedtime .  triamcinolone cream (KENALOG) 0.1 %, Apply 1 application topically 4 (four) times daily as needed.  Problem list She has Cerebrovascular disease; Asthma; GERD; Osteoarthritis; BICUSPID AORTIC VALVE; Hyperlipidemia associated with type 2  diabetes mellitus ([place]); Vitamin D deficiency; Essential hypertension; Left carotid bruit; Medication management; Sleep disorder, shift-work; Palpitations; Abnormal electrocardiogram; Type 2 diabetes with stage 1 chronic kidney disease GFR>90 (HCC); and Esotropia of right eye on their problem list.  Review of Systems See HPI     Objective:   Physical Exam Constitutional:      Appearance: She is well-developed.  HENT:     Head: Normocephalic and atraumatic.  Eyes:     Conjunctiva/sclera: Conjunctivae normal.     Pupils: Pupils are equal, round, and reactive to light.  Cardiovascular:     Rate and Rhythm: Normal rate and regular rhythm.  Pulmonary:     Effort: Pulmonary effort is normal.     Breath sounds: Normal breath sounds.  Abdominal:     General: Bowel sounds are normal.     Palpations: Abdomen is soft.     Tenderness: There is no abdominal tenderness.  Musculoskeletal:     Cervical back: Normal range of motion and neck supple.     Comments: Patient is able to ambulate well. Gait is not  Antalgic. Straight leg raising with dorsiflexion negative bilaterally for radicular symptoms. Sensory exam in the legs are normal, no decreased sensation. Knee reflexes are normal Ankle reflexes are normal Strength is normal and symmetric in arms and legs. There is not SI tenderness to palpation.  There is notparaspinal muscle spasm.  There is midline tenderness.  Normal distal pulses.   Lymphadenopathy:     Cervical: No cervical adenopathy.  Skin:    General: Skin is warm and dry.     Findings: No rash.  Neurological:     Mental Status: She is alert and oriented to person, place, and time.     Deep Tendon Reflexes: Reflexes are normal and symmetric.        Assessment & Plan:    Pain in both lower extremities -     CBC with Differential/Platelet -     COMPLETE METABOLIC PANEL WITH GFR -     Magnesium -     Sedimentation rate -     CK -     Iron,Total/Total Iron Binding Cap -      Vitamin B12 Normal pulses, normal neuro, normal back/hip/knee At rest an more likely RLS- will start back on gabapentin, check labs, if not better follow up or if any new symptoms

## 2019-05-02 NOTE — Patient Instructions (Addendum)
Start back on the gabapentin  Can take the gabapentin 100-300mg  at night.   It can make you sleepy so we suggest trying it at night first and please plan to not drive or do anything strenuous.  Also please do not take this medication with alcohol.   Start out 1 pill at night before bed, can increase to 2 pills at night before bed. Please call the office if you have any side effects.   Can take 3 pills a day total however you would like  Some examples: - 1 breakfast, lunch, bedtime. - 1 at breakfast, 2 at bed time  Common side effects are sleepiness, concentration problems, dizziness, swelling.  Do not stop abruptly unless you have a reaction to it.   Weighted blanket at night Increase walking Stretch  Restless Legs Syndrome Restless legs syndrome is a condition that causes uncomfortable feelings or sensations in the legs, especially while sitting or lying down. The sensations usually cause an overwhelming urge to move the legs. The arms can also sometimes be affected. The condition can range from mild to severe. The symptoms often interfere with a person's ability to sleep. What are the causes? The cause of this condition is not known. What increases the risk? The following factors may make you more likely to develop this condition:  Being older than 50.  Pregnancy.  Being a woman. In general, the condition is more common in women than in men.  A family history of the condition.  Having iron deficiency.  Overuse of caffeine, nicotine, or alcohol.  Certain medical conditions, such as kidney disease, Parkinson's disease, or nerve damage.  Certain medicines, such as those for high blood pressure, nausea, colds, allergies, depression, and some heart conditions. What are the signs or symptoms? The main symptom of this condition is uncomfortable sensations in the legs, such as:  Pulling.  Tingling.  Prickling.  Throbbing.  Crawling.  Burning. Usually, the  sensations:  Affect both sides of the body.  Are worse when you sit or lie down.  Are worse at night. These may wake you up or make it difficult to fall asleep.  Make you have a strong urge to move your legs.  Are temporarily relieved by moving your legs. The arms can also be affected, but this is rare. People who have this condition often have tiredness during the day because of their lack of sleep at night. How is this diagnosed? This condition may be diagnosed based on:  Your symptoms.  Blood tests. In some cases, you may be monitored in a sleep lab by a specialist (a sleep study). This can detect any disruptions in your sleep. How is this treated? This condition is treated by managing the symptoms. This may include:  Lifestyle changes, such as exercising, using relaxation techniques, and avoiding caffeine, alcohol, or tobacco.  Medicines. Anti-seizure medicines may be tried first. Follow these instructions at home:     General instructions  Take over-the-counter and prescription medicines only as told by your health care provider.  Use methods to help relieve the uncomfortable sensations, such as: ? Massaging your legs. ? Walking or stretching. ? Taking a cold or hot bath.  Keep all follow-up visits as told by your health care provider. This is important. Lifestyle  Practice good sleep habits. For example, go to bed and get up at the same time every day. Most adults should get 7-9 hours of sleep each night.  Exercise regularly. Try to get at least 30  minutes of exercise most days of the week.  Practice ways of relaxing, such as yoga or meditation.  Avoid caffeine and alcohol.  Do not use any products that contain nicotine or tobacco, such as cigarettes and e-cigarettes. If you need help quitting, ask your health care provider. Contact a health care provider if:  Your symptoms get worse or they do not improve with treatment. Summary  Restless legs syndrome is  a condition that causes uncomfortable feelings or sensations in the legs, especially while sitting or lying down.  The symptoms often interfere with a person's ability to sleep.  This condition is treated by managing the symptoms. You may need to make lifestyle changes or take medicines. This information is not intended to replace advice given to you by your health care provider. Make sure you discuss any questions you have with your health care provider. Document Revised: 02/21/2017 Document Reviewed: 02/21/2017 Elsevier Patient Education  [place].

## 2019-05-03 LAB — COMPLETE METABOLIC PANEL WITH GFR
AG Ratio: 1.9 (calc) (ref 1.0–2.5)
ALT: 19 U/L (ref 6–29)
AST: 17 U/L (ref 10–35)
Albumin: 4.1 g/dL (ref 3.6–5.1)
Alkaline phosphatase (APISO): 61 U/L (ref 37–153)
BUN: 17 mg/dL (ref 7–25)
CO2: 24 mmol/L (ref 20–32)
Calcium: 9 mg/dL (ref 8.6–10.4)
Chloride: 101 mmol/L (ref 98–110)
Creat: 0.53 mg/dL (ref 0.50–0.99)
GFR, Est African American: 117 mL/min/{1.73_m2} (ref >=60)
GFR, Est Non African American: 101 mL/min/{1.73_m2} (ref >=60)
Globulin: 2.2 g/dL (calc) (ref 1.9–3.7)
Glucose, Bld: 108 mg/dL — ABNORMAL HIGH (ref 65–99)
Potassium: 4.4 mmol/L (ref 3.5–5.3)
Sodium: 138 mmol/L (ref 135–146)
Total Bilirubin: 0.2 mg/dL (ref 0.2–1.2)
Total Protein: 6.3 g/dL (ref 6.1–8.1)

## 2019-05-03 LAB — CBC WITH DIFFERENTIAL/PLATELET
Absolute Monocytes: 497 cells/uL (ref 200–950)
Basophils Absolute: 49 cells/uL (ref 0–200)
Basophils Relative: 0.7 %
Eosinophils Absolute: 126 cells/uL (ref 15–500)
Eosinophils Relative: 1.8 %
HCT: 37.6 % (ref 35.0–45.0)
Hemoglobin: 12.2 g/dL (ref 11.7–15.5)
Lymphs Abs: 1764 cells/uL (ref 850–3900)
MCH: 30.6 pg (ref 27.0–33.0)
MCHC: 32.4 g/dL (ref 32.0–36.0)
MCV: 94.2 fL (ref 80.0–100.0)
MPV: 11.6 fL (ref 7.5–12.5)
Monocytes Relative: 7.1 %
Neutro Abs: 4564 cells/uL (ref 1500–7800)
Neutrophils Relative %: 65.2 %
Platelets: 264 10*3/uL (ref 140–400)
RBC: 3.99 10*6/uL (ref 3.80–5.10)
RDW: 12.1 % (ref 11.0–15.0)
Total Lymphocyte: 25.2 %
WBC: 7 10*3/uL (ref 3.8–10.8)

## 2019-05-03 LAB — VITAMIN B12: Vitamin B-12: 708 pg/mL (ref 200–1100)

## 2019-05-03 LAB — SEDIMENTATION RATE: Sed Rate: 6 mm/h (ref 0–30)

## 2019-05-03 LAB — IRON, TOTAL/TOTAL IRON BINDING CAP
%SAT: 13 % (calc) — ABNORMAL LOW (ref 16–45)
Iron: 39 ug/dL — ABNORMAL LOW (ref 45–160)
TIBC: 293 mcg/dL (calc) (ref 250–450)

## 2019-05-03 LAB — MAGNESIUM: Magnesium: 1.8 mg/dL (ref 1.5–2.5)

## 2019-05-03 LAB — CK: Total CK: 68 U/L (ref 29–143)

## 2019-05-19 ENCOUNTER — Ambulatory Visit: Payer: BC Managed Care – PPO | Attending: Internal Medicine

## 2019-05-19 ENCOUNTER — Ambulatory Visit: Payer: BC Managed Care – PPO

## 2019-05-19 DIAGNOSIS — Z23 Encounter for immunization: Secondary | ICD-10-CM

## 2019-05-19 NOTE — Progress Notes (Signed)
   Covid-19 Vaccination Clinic  Name:  [name]    MRN: [id number] DOB: 06-16-1955  05/19/2019  [name] was observed post Covid-19 immunization for 15 minutes without incident. She was provided with Vaccine Information Sheet and instruction to access the V-Safe system.   [name] was instructed to call 911 with any severe reactions post vaccine: [name] Difficulty breathing  . Swelling of face and throat  . A fast heartbeat  . A bad rash all over body  . Dizziness and weakness   Immunizations Administered    Name Date Dose VIS Date Route   Moderna COVID-19 Vaccine 05/19/2019  1:21 PM 0.5 mL 01/16/2019 Intramuscular   Manufacturer: Moderna   Lot: [id number]   [id number]

## 2019-05-31 NOTE — Progress Notes (Signed)
Subjective:    Patient ID: [name], female    DOB: 1955-09-30, 64 y.o.   MRN: [id number]  HPI 64 y.o. AAF with history of CVA, asthma, bicuspid aortic valve, chol, DM2, HTN presents for follow up with labs for pain in her legs.   She had normal back, hip, knee exam. Normal neuro, normal pulses.  She was presumed to have RLS and iron def last visit, started back on her gabapentin just 100mg  only 1 pill at night (300mg  caused fatigue during the day), started on iron for a deficiency.   She is taking a 10 mg pill that she was given a long time ago,  But states it is not helping.   She does take mobic, told to back off and will given hemoccult this visit.   Last colonoscopy: 01/28/2015 - Dr Earlean Shawl due 2026, is postmenopausal, is on metformin, is not on PPI.   She is having some left SI pain, following with Dr. Caprice Beaver.   Today, she reports that she is tired. She has increased her iron, she is on a 325mg , 65mg , and 85mg - take them through out the day. She is on probiotic and fiber, having some constipation.  Lab Results  Component Value Date   IRON 39 (L) 05/02/2019   TIBC 293 05/02/2019   FERRITIN 79 10/05/2018    CBC is normal, no infection. Kidney and liver are normal B12 normal No inflammation and no sed rate.   Blood pressure 126/84, pulse 68, temperature (!) 97.3 F (36.3 C), weight 125 lb (56.7 kg), SpO2 97 %.  Medications  Current Outpatient Medications (Endocrine & Metabolic):  .  metFORMIN (GLUCOPHAGE-XR) 500 MG 24 hr tablet, Take 2 tablets 2 x /day with Meals for Diabetes  Current Facility-Administered Medications (Endocrine & Metabolic):  .  dexamethasone (DECADRON) injection 10 mg  Current Outpatient Medications (Cardiovascular):  .  lisinopril-hydrochlorothiazide (ZESTORETIC) 10-12.5 MG tablet, Take 1 tablet Daily for BP & Diabetic Kidney Protection .  metoprolol succinate (TOPROL-XL) 25 MG 24 hr tablet, Take 1 tablet Daily for BP .  rosuvastatin  (CRESTOR) 40 MG tablet, TAKE ONE-HALF TO ONE TABLET DAILY OR AS DIRECTED FOR CHOLESTEROL   Current Outpatient Medications (Respiratory):  .  mometasone (NASONEX) 50 MCG/ACT nasal spray, Place 2 sprays into the nose daily. [name]  albuterol (PROVENTIL HFA;VENTOLIN HFA) 108 (90 Base) MCG/ACT inhaler, Use 1  To 2 inhalations 5 minutes apart every 4 hours if needed to rescue asthma   Current Outpatient Medications (Analgesics):  .  aspirin EC 81 MG tablet, Take 81 mg by mouth daily. .  meloxicam (MOBIC) 15 MG tablet, Take 1/2 to 1 tablet daily with food for pain &  Inflammation - limit to maximum of 5 days /week     Current Outpatient Medications (Other):  [name]  B Complex-C (SUPER B COMPLEX PO), Take 1 tablet by mouth daily. Takes every other day .  BAYER MICROLET LANCETS lancets, Check blood sugar 1 time daily-DX-E11.9. [name]  Cholecalciferol (VITAMIN D PO), Take 2 capsules by mouth daily. Take 4000 units by mouth twice a day .  Cinnamon 500 MG capsule, Take 1,000 mg by mouth daily.  .  cyclobenzaprine (FLEXERIL) 10 MG tablet, Take 1/2 to 1 tablet at Bedtime for Muscle Spasm .  Diclofenac Sodium 1.5 % SOLN, Place a small amount of gel on the bilateral hands for hand arthritis .  famotidine (PEPCID) 40 MG tablet, TAKE 1 TABLET DAILY FOR ACID, INDIGESTION AND HEARTBURN .  fluconazole (  DIFLUCAN) 150 MG tablet, Use once for yeast infection as needed .  gabapentin (NEURONTIN) 100 MG capsule, TAKE 1-3 PILLS AT NIGHT (Patient taking differently: as needed. ) .  glucose blood (BAYER CONTOUR NEXT TEST) test strip, Check blood sugar 1 time daily-DX-E11.9. .  MAGNESIUM PO, Take 2 tablets by mouth daily. .  Multiple Vitamin (MULITIVITAMIN WITH MINERALS) TABS, Take 1 tablet by mouth daily. [name]  nystatin (MYCOSTATIN) 100000 UNIT/ML suspension, 5 ml four times a day, retain in mouth as long as possible (Swish and Spit).  Use for 48 hours after symptoms resolve. [name]  OVER THE COUNTER MEDICATION, Takes Omega-Q vitamin  which contains Tumeric. .  phentermine (ADIPEX-P) 37.5 MG tablet, Take 1/2 to 1 tablet every morning for dieting & weightloss .  potassium chloride SA (K-DUR) 20 MEQ tablet, Take 1 tablet 3 x /day after meals .  Psyllium (METAMUCIL PO), Take 1 Dose by mouth daily. .  traZODone (DESYREL) 150 MG tablet, Take 1/2 to 1 tablet 1  to 2 hours before Bedtime .  triamcinolone cream (KENALOG) 0.1 %, Apply 1 application topically 4 (four) times daily as needed. [name]  rOPINIRole (REQUIP) 0.5 MG tablet, Take 1 tablet (0.5 mg total) by mouth 3 (three) times daily.   Problem list She has Cerebrovascular disease; Asthma; GERD; Osteoarthritis; BICUSPID AORTIC VALVE; Hyperlipidemia associated with type 2 diabetes mellitus ([facility]); Vitamin D deficiency; Essential hypertension; Left carotid bruit; Medication management; Sleep disorder, shift-work; Palpitations; Abnormal electrocardiogram; Type 2 diabetes with stage 1 chronic kidney disease GFR>90 (HCC); and Esotropia of right eye on their problem list.  Review of Systems See HPI     Objective:   Physical Exam Constitutional:      Appearance: She is well-developed.  HENT:     Head: Normocephalic and atraumatic.  Eyes:     Conjunctiva/sclera: Conjunctivae normal.     Pupils: Pupils are equal, round, and reactive to light.  Cardiovascular:     Rate and Rhythm: Normal rate and regular rhythm.  Pulmonary:     Effort: Pulmonary effort is normal.     Breath sounds: Normal breath sounds.  Abdominal:     General: Bowel sounds are normal.     Palpations: Abdomen is soft.     Tenderness: There is no abdominal tenderness.  Musculoskeletal:     Cervical back: Normal range of motion and neck supple.     Comments: Patient is able to ambulate well. Gait is not  Antalgic. Straight leg raising with dorsiflexion negative bilaterally for radicular symptoms. Sensory exam in the legs are normal, no decreased sensation. Knee reflexes are normal Ankle reflexes are normal  Strength is normal and symmetric in arms and legs. There is left SI tenderness to palpation.  There is notparaspinal muscle spasm.  There is midline tenderness.  Normal distal pulses.   Lymphadenopathy:     Cervical: No cervical adenopathy.  Skin:    General: Skin is warm and dry.     Findings: No rash.  Neurological:     Mental Status: She is alert and oriented to person, place, and time.     Deep Tendon Reflexes: Reflexes are normal and symmetric.        Assessment & Plan:   Pain in both lower extremities -     rOPINIRole (REQUIP) 0.5 MG tablet; Take 1 tablet (0.5 mg total) by mouth 3 (three) times daily. -     COMPLETE METABOLIC PANEL WITH GFR  Try the requip, slowly taper up.  Left  sided sciatica -     dexamethasone (DECADRON) injection 10 mg Verbal consent obtained, risk and benefits were addressed with patient. A trigger point injection was performed at the site of maximal tenderness using 1% plain Lidocaine and Dexamethasone. This was well tolerated, and followed by immediate relief of pain. Return precautions discussed with the patient.  - may need to refer back to ortho for injections  Iron deficiency anemia, unspecified iron deficiency anemia type -     CBC with Differential/Platelet -     COMPLETE METABOLIC PANEL WITH GFR -     Iron,Total/Total Iron Binding Cap -     Ferritin - do hemoccult card - if positive will refer to GI ? From metformin    Future Appointments  Date Time Provider [place]  07/11/2019 10:30 AM [name], MD [facility] None  01/08/2020 11:00 AM [name], MD [facility] None

## 2019-06-04 ENCOUNTER — Ambulatory Visit: Payer: BC Managed Care – PPO | Admitting: Physician Assistant

## 2019-06-04 ENCOUNTER — Encounter: Payer: Self-pay | Admitting: Physician Assistant

## 2019-06-04 ENCOUNTER — Other Ambulatory Visit: Payer: Self-pay

## 2019-06-04 VITALS — BP 126/84 | HR 68 | Temp 97.3°F | Wt 125.0 lb

## 2019-06-04 DIAGNOSIS — D509 Iron deficiency anemia, unspecified: Secondary | ICD-10-CM | POA: Diagnosis not present

## 2019-06-04 DIAGNOSIS — M79605 Pain in left leg: Secondary | ICD-10-CM

## 2019-06-04 DIAGNOSIS — M79604 Pain in right leg: Secondary | ICD-10-CM | POA: Diagnosis not present

## 2019-06-04 DIAGNOSIS — M5432 Sciatica, left side: Secondary | ICD-10-CM | POA: Diagnosis not present

## 2019-06-04 LAB — COMPLETE METABOLIC PANEL WITH GFR
AG Ratio: 1.7 (calc) (ref 1.0–2.5)
ALT: 19 U/L (ref 6–29)
AST: 16 U/L (ref 10–35)
Albumin: 4.2 g/dL (ref 3.6–5.1)
Alkaline phosphatase (APISO): 63 U/L (ref 37–153)
BUN: 18 mg/dL (ref 7–25)
CO2: 30 mmol/L (ref 20–32)
Calcium: 9.4 mg/dL (ref 8.6–10.4)
Chloride: 102 mmol/L (ref 98–110)
Creat: 0.53 mg/dL (ref 0.50–0.99)
GFR, Est African American: 117 mL/min/{1.73_m2} (ref >=60)
GFR, Est Non African American: 101 mL/min/{1.73_m2} (ref >=60)
Globulin: 2.5 g/dL (calc) (ref 1.9–3.7)
Glucose, Bld: 123 mg/dL — ABNORMAL HIGH (ref 65–99)
Potassium: 4.3 mmol/L (ref 3.5–5.3)
Sodium: 139 mmol/L (ref 135–146)
Total Bilirubin: 0.2 mg/dL (ref 0.2–1.2)
Total Protein: 6.7 g/dL (ref 6.1–8.1)

## 2019-06-04 LAB — CBC WITH DIFFERENTIAL/PLATELET
Absolute Monocytes: 486 cells/uL (ref 200–950)
Basophils Absolute: 57 cells/uL (ref 0–200)
Basophils Relative: 0.7 %
Eosinophils Absolute: 122 cells/uL (ref 15–500)
Eosinophils Relative: 1.5 %
HCT: 40.7 % (ref 35.0–45.0)
Hemoglobin: 13 g/dL (ref 11.7–15.5)
Lymphs Abs: 1863 cells/uL (ref 850–3900)
MCH: 30.7 pg (ref 27.0–33.0)
MCHC: 31.9 g/dL — ABNORMAL LOW (ref 32.0–36.0)
MCV: 96 fL (ref 80.0–100.0)
MPV: 11.5 fL (ref 7.5–12.5)
Monocytes Relative: 6 %
Neutro Abs: 5573 cells/uL (ref 1500–7800)
Neutrophils Relative %: 68.8 %
Platelets: 272 10*3/uL (ref 140–400)
RBC: 4.24 10*6/uL (ref 3.80–5.10)
RDW: 12.6 % (ref 11.0–15.0)
Total Lymphocyte: 23 %
WBC: 8.1 10*3/uL (ref 3.8–10.8)

## 2019-06-04 LAB — IRON, TOTAL/TOTAL IRON BINDING CAP
%SAT: 18 % (calc) (ref 16–45)
Iron: 51 ug/dL (ref 45–160)
TIBC: 291 mcg/dL (calc) (ref 250–450)

## 2019-06-04 LAB — FERRITIN: Ferritin: 41 ng/mL (ref 16–288)

## 2019-06-04 MED ORDER — DEXAMETHASONE SODIUM PHOSPHATE 100 MG/10ML IJ SOLN: 10.0000 mg | Freq: Once | INTRAMUSCULAR | Status: DC

## 2019-06-04 MED ORDER — ROPINIROLE HCL 0.5 MG PO TABS: 0.5000 mg | ORAL_TABLET | Freq: Three times a day (TID) | ORAL | 2 refills | Status: DC

## 2019-06-04 NOTE — Patient Instructions (Addendum)
Please do the hemoccult card and sent that back to see if you have any blood coming from GI tract to explain your low iron.  If that is negative may be from the metformin.   Going to try the injection today in your left SI joint, if this is not better than please follow up with Ortho.  You can try some of the at home treatments for Restless legs and we will check some labs on you looking for deficiencies that could contribute to it however we will have you start on :   Requip 0.25mg - start this dose about 1-2 hours BEFORE restless symptoms start.[name] START AT 8 PM or on your way home from work- set an alarm.   The dose may be increased by 0.25 mg every two to three days until relief is obtained. Most patients require at least 2 mg, and doses up to 4 mg may be needed.  So start on 1/2 pill for 2-3 days around 8 PM. Then go up to 1 pill for 2-3 days.  Then go up to 1.5 pills for 2-3 days, then  Up to 2 pills for 2-3 days.   Can go up to 4 of the 0.5mg  at night or the 2 mg.  If you do well with this medication we can start to use it during the day as well.   Common adverse side effects: usually mild, transient, and limited to nausea, lightheadedness, and fatigue; these usually resolve within 10 to 14 days. Less frequent side effects include nasal stuffiness, and leg edema; these are reversible if the medication is stopped.   Restless Legs Syndrome Restless legs syndrome is a movement disorder. It may also be called a sensorimotor disorder.  CAUSES  No one knows what specifically causes restless legs syndrome, but it tends to run in families. It is also more common in people with low iron, in pregnancy, in people who need dialysis, and those with nerve damage (neuropathy).Some medications may make restless legs syndrome worse.Those medications include drugs to treat high blood pressure, some heart conditions, nausea, colds, allergies, and depression. SYMPTOMS Symptoms include uncomfortable  sensations in the legs. These leg sensations are worse during periods of inactivity or rest. They are also worse while sitting or lying down. Individuals that have the disorder describe sensations in the legs that feel like:  Pulling.  Drawing.  Crawling.  Worming.  Boring.  Tingling.  Pins and needles.  Prickling.  Pain. The sensations are usually accompanied by an overwhelming urge to move the legs. Sudden muscle jerks may also occur. Movement provides temporary relief from the discomfort. In rare cases, the arms may also be affected. Symptoms may interfere with going to sleep (sleep onset insomnia). Restless legs syndrome may also be related to periodic limb movement disorder (PLMD). PLMD is another more common motor disorder. It also causes interrupted sleep. The symptoms from PLMD usually occur most often when you are awake. TREATMENT  Treatment for restless legs syndrome is symptomatic. This means that the symptoms are treated.   Massage and cold compresses may provide temporary relief.  Walk, stretch, or take a cold or hot bath.  Get regular exercise and a good night's sleep.  Avoid caffeine, alcohol, nicotine, and medications that can make it worse.  Do activities that provide mental stimulation like discussions, needlework, and video games. These may be helpful if you are not able to walk or stretch. Some medications are effective in relieving the symptoms. However, many of these  medications have side effects. Ask your caregiver about medications that may help your symptoms. Correcting iron deficiency may improve symptoms for some patients. Document Released: 01/22/2002 Document Revised: 06/18/2013 Document Reviewed: 04/30/2010 [employer] Patient Information 2015 [place], [place]. This information is not intended to replace advice given to you by your health care provider. Make sure you discuss any questions you have with your health care provider.

## 2019-06-19 DIAGNOSIS — M79662 Pain in left lower leg: Secondary | ICD-10-CM | POA: Diagnosis not present

## 2019-06-19 DIAGNOSIS — M545 Low back pain: Secondary | ICD-10-CM | POA: Diagnosis not present

## 2019-07-05 ENCOUNTER — Other Ambulatory Visit: Payer: Self-pay | Admitting: *Deleted

## 2019-07-05 DIAGNOSIS — Z961 Presence of intraocular lens: Secondary | ICD-10-CM | POA: Diagnosis not present

## 2019-07-05 DIAGNOSIS — E119 Type 2 diabetes mellitus without complications: Secondary | ICD-10-CM | POA: Diagnosis not present

## 2019-07-05 DIAGNOSIS — Z1211 Encounter for screening for malignant neoplasm of colon: Secondary | ICD-10-CM

## 2019-07-05 DIAGNOSIS — H524 Presbyopia: Secondary | ICD-10-CM | POA: Diagnosis not present

## 2019-07-05 LAB — POC HEMOCCULT BLD/STL (HOME/3-CARD/SCREEN)
Card #2 Fecal Occult Blod, POC: NEGATIVE
Card #3 Fecal Occult Blood, POC: NEGATIVE
Fecal Occult Blood, POC: NEGATIVE

## 2019-07-05 LAB — HM DIABETES EYE EXAM

## 2019-07-06 DIAGNOSIS — Z1212 Encounter for screening for malignant neoplasm of rectum: Secondary | ICD-10-CM | POA: Diagnosis not present

## 2019-07-06 DIAGNOSIS — Z1211 Encounter for screening for malignant neoplasm of colon: Secondary | ICD-10-CM | POA: Diagnosis not present

## 2019-07-09 NOTE — Progress Notes (Signed)
FOLLOW UP  Assessment and Plan:   Hypertension Well controlled with current medications  Monitor blood pressure at home; patient to call if consistently greater than 130/80 Continue DASH diet.   Reminder to go to the ER if any CP, SOB, nausea, dizziness, severe HA, changes vision/speech, left arm numbness and tingling and jaw pain.  Cholesterol Currently at goal of LDL <70; continue crestor  Continue low cholesterol diet and exercise.  Check lipid panel.   Diabetes with diabetic chronic kidney disease Continue medication: metformin Continue diet and exercise.  Perform daily foot/skin check, notify office of any concerning changes.  Check A1C Not checking sugars at home - does have equipment, encouraged to check at least once a week   CKD 1 associated with T2DM  Increase fluids, avoid NSAIDS, monitor sugars, will monitor  Vitamin D Def At goal at last visit; continue supplementation to maintain goal of 60-100  Vit D level  Insomnia/somnolensce due to shift work/RLS Continue trazodone as needed, take 1/2 tab but try taking earlier in the evening to avoid AM sedation, also taking gabapentin - try 200 mg but take earlier in evening due to AM sedation. Try taper off of meds sporadically to evaluate benefit. May benefit more from benadryl due to night time itching, gabapentin for back/RLS  Continue requip for RLS unless improved with higher dose gabapentin ? Need sleep study, declines at this time  Continue diet and meds as discussed. Further disposition pending results of labs. Discussed med's effects and SE's.   Over 30 minutes of exam, counseling, chart review, and critical decision making was performed.   Future Appointments  Date Time Provider [place]  01/08/2020 11:00 AM [name], MD [facility] None    ----------------------------------------------------------------------------------------------------------------------  HPI 64 y.o. female  presents for  3 month follow up on hypertension, cholesterol, diabetes with CKD, weight and vitamin D deficiency.   She is followed by Dr. Stanford Breed for cerebrovascular disease with most recent dopplers in 2017 showing mild disease only, with recommendation to continue ASA and statin.    she is prescribed phentermine for weight loss and for somnolence due to shift work, she reports taking 1/2 tab prior to work 5 days week, doesn't take on weekends.   She works from [employer]- 3-5:30 AM, taking the trazodone 150 1/2 at night that is helping. melatonin, taking gabapentin 100 mg at HS and lower back pain and some aching in legs if sits for extended periods (follows with ortho)   She does have a history of RLS, taking requip 10 mg TID which is helpful, She is on an iron supplement 65mg  BID.   Lab Results  Component Value Date   IRON 51 06/04/2019   TIBC 291 06/04/2019   FERRITIN 41 06/04/2019   GERD controlled by famotidine 20 mg daily.   BMI is Body mass index is 26.13 kg/m., she is working on diet and exercise. Wt Readings from Last 3 Encounters:  07/11/19 125 lb (56.7 kg)  06/04/19 125 lb (56.7 kg)  05/02/19 124 lb (56.2 kg)   She has not been checking her BP at home, today their BP is BP: 126/66  She does not workout. She denies chest pain, shortness of breath, dizziness.   She is on cholesterol medication on rosuvastatin (40 mg daily) and denies myalgias. Her cholesterol is at goal. The cholesterol last visit was:   Lab Results  Component Value Date   CHOL 115 04/03/2019   HDL 45 (L) 04/03/2019   LDLCALC 57  04/03/2019   TRIG 54 04/03/2019   CHOLHDL 2.6 04/03/2019    She has not been working on diet and exercise for T2 diabetes With hyperlipidemia on crestor CAD on ASA Stage 1 CKD She is on ACE  on metformin - takes 2000 mg daily  Cinnamon Could not tolerate invokana denies increased appetite, nausea, paresthesia of the feet, polydipsia, polyuria, visual disturbances and vomiting.  She reports  she has not been checking her sugars despite being advised to do so. She denies any symptoms of hypoglycemia. She does have glucometer. Last A1C in the office was:  Lab Results  Component Value Date   HGBA1C 7.2 (H) 04/03/2019   She has GFR in CKD 1 stage, associated with T2DM monitored at this office:  Lab Results  Component Value Date   GFRAA 117 06/04/2019   Patient is on Vitamin D supplement and above goal at the last check:    Lab Results  Component Value Date   VD25OH 62 04/03/2019        Current Medications:  Current Outpatient Medications on File Prior to Visit  Medication Sig  . albuterol (PROVENTIL HFA;VENTOLIN HFA) 108 (90 Base) MCG/ACT inhaler Use 1  To 2 inhalations 5 minutes apart every 4 hours if needed to rescue asthma  . aspirin EC 81 MG tablet Take 81 mg by mouth daily.  . B Complex-C (SUPER B COMPLEX PO) Take 1 tablet by mouth daily. Takes every other day  . BAYER MICROLET LANCETS lancets Check blood sugar 1 time daily-DX-E11.9.  [name] Cholecalciferol (VITAMIN D PO) Take 2 capsules by mouth daily. Take 4000 units by mouth twice a day  . Cinnamon 500 MG capsule Take 1,000 mg by mouth daily.   . cyclobenzaprine (FLEXERIL) 10 MG tablet Take 1/2 to 1 tablet at Bedtime for Muscle Spasm  . Diclofenac Sodium 1.5 % SOLN Place a small amount of gel on the bilateral hands for hand arthritis  . famotidine (PEPCID) 40 MG tablet TAKE 1 TABLET DAILY FOR ACID, INDIGESTION AND HEARTBURN  . fluconazole (DIFLUCAN) 150 MG tablet Use once for yeast infection as needed  . gabapentin (NEURONTIN) 100 MG capsule TAKE 1-3 PILLS AT NIGHT (Patient taking differently: as needed. )  . glucose blood (BAYER CONTOUR NEXT TEST) test strip Check blood sugar 1 time daily-DX-E11.9.  . lisinopril-hydrochlorothiazide (ZESTORETIC) 10-12.5 MG tablet Take 1 tablet Daily for BP & Diabetic Kidney Protection  . MAGNESIUM PO Take 2 tablets by mouth daily.  . meloxicam (MOBIC) 15 MG tablet Take 1/2 to 1 tablet  daily with food for pain &  Inflammation - limit to maximum of 5 days /week  . metFORMIN (GLUCOPHAGE-XR) 500 MG 24 hr tablet Take 2 tablets 2 x /day with Meals for Diabetes  . metoprolol succinate (TOPROL-XL) 25 MG 24 hr tablet Take 1 tablet Daily for BP  . mometasone (NASONEX) 50 MCG/ACT nasal spray Place 2 sprays into the nose daily.  . Multiple Vitamin (MULITIVITAMIN WITH MINERALS) TABS Take 1 tablet by mouth daily.  [name] OVER THE COUNTER MEDICATION Takes Omega-Q vitamin which contains Tumeric.  . phentermine (ADIPEX-P) 37.5 MG tablet Take 1/2 to 1 tablet every morning for dieting & weightloss  . potassium chloride SA (K-DUR) 20 MEQ tablet Take 1 tablet 3 x /day after meals  . Psyllium (METAMUCIL PO) Take 1 Dose by mouth daily.  [name] rOPINIRole (REQUIP) 0.5 MG tablet Take 1 tablet (0.5 mg total) by mouth 3 (three) times daily. (Patient taking differently: Take 10 mg  by mouth 3 (three) times daily. )  . rosuvastatin (CRESTOR) 40 MG tablet TAKE ONE-HALF TO ONE TABLET DAILY OR AS DIRECTED FOR CHOLESTEROL  . traZODone (DESYREL) 150 MG tablet Take 1/2 to 1 tablet 1  to 2 hours before Bedtime  . triamcinolone cream (KENALOG) 0.1 % Apply 1 application topically 4 (four) times daily as needed.  . nystatin (MYCOSTATIN) 100000 UNIT/ML suspension 5 ml four times a day, retain in mouth as long as possible (Swish and Spit).  Use for 48 hours after symptoms resolve. (Patient not taking: Reported on 07/11/2019)   Current Facility-Administered Medications on File Prior to Visit  Medication  . dexamethasone (DECADRON) injection 10 mg     Allergies:  Allergies  Allergen Reactions  . Naprosyn [Naproxen] Other (See Comments)    GI upset     Medical History:  Past Medical History:  Diagnosis Date  . Allergic rhinitis   . ASTHMA   . BICUSPID AORTIC VALVE   . CEREBROVASCULAR DISEASE   . DEGENERATIVE JOINT DISEASE   . Diabetes mellitus   . DJD (degenerative joint disease)   . GERD   . HSV-1 (herpes  simplex virus 1) infection   . HYPERLIPIDEMIA   . Hypertension   . Palpitation   . Vitamin D deficiency    Family history- Reviewed and unchanged Social history- Reviewed and unchanged   Review of Systems:  Review of Systems  Constitutional: Positive for malaise/fatigue (daytime fatigue/somnolensce ). Negative for weight loss.  HENT: Negative for hearing loss and tinnitus.   Eyes: Negative for blurred vision and double vision.  Respiratory: Negative for cough, shortness of breath and wheezing.   Cardiovascular: Negative for chest pain, palpitations, orthopnea, claudication and leg swelling.  Gastrointestinal: Negative for abdominal pain, blood in stool, constipation, diarrhea, heartburn, melena, nausea and vomiting.  Genitourinary: Negative.   Musculoskeletal: Positive for back pain (intermittent lumbar with radicular sx, follows sports med). Negative for falls, joint pain, myalgias and neck pain.  Skin: Negative for rash.  Neurological: Negative for dizziness, tingling, sensory change, weakness and headaches.  Endo/Heme/Allergies: Negative for polydipsia.  Psychiatric/Behavioral: Negative for depression and substance abuse. The patient has insomnia. The patient is not nervous/anxious.   All other systems reviewed and are negative.   Physical Exam: BP 126/66   Pulse 87   Temp 97.7 F (36.5 C)   Ht 4\' 10"  (1.473 m)   Wt 125 lb (56.7 kg)   SpO2 97%   BMI 26.13 kg/m  Wt Readings from Last 3 Encounters:  07/11/19 125 lb (56.7 kg)  06/04/19 125 lb (56.7 kg)  05/02/19 124 lb (56.2 kg)   General Appearance: Well nourished, in no apparent distress. Eyes: PERRLA, Right eye gaze permanently deviated, conjunctiva no swelling or erythema Sinuses: No Frontal/maxillary tenderness ENT/Mouth: Ext aud canals clear, TMs without erythema, bulging. No erythema, swelling, or exudate on post pharynx.  Tonsils not swollen or erythematous. Hearing normal.  Neck: Supple, thyroid normal.   Respiratory: Respiratory effort normal, BS equal bilaterally without rales, rhonchi, wheezing or stridor.  Cardio: RRR with no MRGs. Brisk peripheral pulses without edema.  Abdomen: Soft, + BS.  Non tender, no guarding, rebound, hernias, masses. Lymphatics: Non tender without lymphadenopathy.  Musculoskeletal: Full ROM, no laxity or crepitus to bilateral knees, 5/5 strength, Normal gait, neg straight leg raise.  Skin: Warm, dry without rashes, lesions, ecchymosis.  Neuro: Cranial nerves intact. No cerebellar symptoms.  Psych: Awake and oriented X 3, normal affect, Insight and Judgment appropriate.  Izora Ribas, NP 11:21 AM [facility]

## 2019-07-10 ENCOUNTER — Encounter: Payer: Self-pay | Admitting: *Deleted

## 2019-07-11 ENCOUNTER — Encounter: Payer: Self-pay | Admitting: Adult Health

## 2019-07-11 ENCOUNTER — Ambulatory Visit: Payer: BC Managed Care – PPO | Admitting: Adult Health

## 2019-07-11 ENCOUNTER — Other Ambulatory Visit: Payer: Self-pay

## 2019-07-11 ENCOUNTER — Ambulatory Visit: Payer: BC Managed Care – PPO | Admitting: Internal Medicine

## 2019-07-11 VITALS — BP 126/66 | HR 87 | Temp 97.7°F | Ht <= 58 in | Wt 125.0 lb

## 2019-07-11 DIAGNOSIS — Z79899 Other long term (current) drug therapy: Secondary | ICD-10-CM

## 2019-07-11 DIAGNOSIS — K219 Gastro-esophageal reflux disease without esophagitis: Secondary | ICD-10-CM | POA: Diagnosis not present

## 2019-07-11 DIAGNOSIS — E785 Hyperlipidemia, unspecified: Secondary | ICD-10-CM

## 2019-07-11 DIAGNOSIS — E1169 Type 2 diabetes mellitus with other specified complication: Secondary | ICD-10-CM

## 2019-07-11 DIAGNOSIS — E1122 Type 2 diabetes mellitus with diabetic chronic kidney disease: Secondary | ICD-10-CM | POA: Diagnosis not present

## 2019-07-11 DIAGNOSIS — N181 Chronic kidney disease, stage 1: Secondary | ICD-10-CM

## 2019-07-11 DIAGNOSIS — I10 Essential (primary) hypertension: Secondary | ICD-10-CM | POA: Diagnosis not present

## 2019-07-11 DIAGNOSIS — E559 Vitamin D deficiency, unspecified: Secondary | ICD-10-CM

## 2019-07-11 DIAGNOSIS — G4726 Circadian rhythm sleep disorder, shift work type: Secondary | ICD-10-CM

## 2019-07-11 MED ORDER — ROSUVASTATIN CALCIUM 40 MG PO TABS: ORAL_TABLET | ORAL | 3 refills | Status: DC

## 2019-07-11 NOTE — Patient Instructions (Addendum)
  Goals    . HEMOGLOBIN A1C < 7.0    . LDL CALC < 70          Try taking benadryl at night for itching and sleep - 25-50 mg  If that doesn't help, try OTC hydrocortisone 1% cream when you get home as needed for itching    Always rinse mouth or brush teeth after using mometasone nasal spray (steroid)    Try gabapentin 200 mg - for sleep OR back pain - try earlier in the night if helps either of these but having problems feeling drowsy in the morning   Trazodone - for sleep - take 1/2 tab - take with dinner/as soon as you get home to avoid morning sedation  May not need all of these if benadryl is helping for sleep at night - try stopping and restarting sleep and back pain medications to see what is actually helping.   Restart back exercises from physical therapy  Follow up with ortho if back is getting worse

## 2019-07-12 LAB — CBC WITH DIFFERENTIAL/PLATELET
Absolute Monocytes: 616 cells/uL (ref 200–950)
Basophils Absolute: 39 cells/uL (ref 0–200)
Basophils Relative: 0.5 %
Eosinophils Absolute: 78 cells/uL (ref 15–500)
Eosinophils Relative: 1 %
HCT: 37.7 % (ref 35.0–45.0)
Hemoglobin: 12 g/dL (ref 11.7–15.5)
Lymphs Abs: 1888 cells/uL (ref 850–3900)
MCH: 30.7 pg (ref 27.0–33.0)
MCHC: 31.8 g/dL — ABNORMAL LOW (ref 32.0–36.0)
MCV: 96.4 fL (ref 80.0–100.0)
MPV: 11.3 fL (ref 7.5–12.5)
Monocytes Relative: 7.9 %
Neutro Abs: 5179 cells/uL (ref 1500–7800)
Neutrophils Relative %: 66.4 %
Platelets: 253 10*3/uL (ref 140–400)
RBC: 3.91 10*6/uL (ref 3.80–5.10)
RDW: 12.3 % (ref 11.0–15.0)
Total Lymphocyte: 24.2 %
WBC: 7.8 10*3/uL (ref 3.8–10.8)

## 2019-07-12 LAB — COMPLETE METABOLIC PANEL WITH GFR
AG Ratio: 1.9 (calc) (ref 1.0–2.5)
ALT: 19 U/L (ref 6–29)
AST: 15 U/L (ref 10–35)
Albumin: 4.1 g/dL (ref 3.6–5.1)
Alkaline phosphatase (APISO): 57 U/L (ref 37–153)
BUN: 15 mg/dL (ref 7–25)
CO2: 32 mmol/L (ref 20–32)
Calcium: 9.3 mg/dL (ref 8.6–10.4)
Chloride: 101 mmol/L (ref 98–110)
Creat: 0.55 mg/dL (ref 0.50–0.99)
GFR, Est African American: 116 mL/min/{1.73_m2} (ref >=60)
GFR, Est Non African American: 100 mL/min/{1.73_m2} (ref >=60)
Globulin: 2.2 g/dL (calc) (ref 1.9–3.7)
Glucose, Bld: 137 mg/dL — ABNORMAL HIGH (ref 65–99)
Potassium: 5.2 mmol/L (ref 3.5–5.3)
Sodium: 138 mmol/L (ref 135–146)
Total Bilirubin: 0.2 mg/dL (ref 0.2–1.2)
Total Protein: 6.3 g/dL (ref 6.1–8.1)

## 2019-07-12 LAB — LIPID PANEL
Cholesterol: 84 mg/dL (ref <200)
HDL: 47 mg/dL — ABNORMAL LOW (ref >=50)
LDL Cholesterol (Calc): 28 mg/dL (calc)
Non-HDL Cholesterol (Calc): 37 mg/dL (calc) (ref <130)
Total CHOL/HDL Ratio: 1.8 (calc) (ref <5.0)
Triglycerides: 33 mg/dL (ref <150)

## 2019-07-12 LAB — TSH: TSH: 0.73 mIU/L (ref 0.40–4.50)

## 2019-07-12 LAB — HEMOGLOBIN A1C
Hgb A1c MFr Bld: 7 % of total Hgb — ABNORMAL HIGH (ref <5.7)
Mean Plasma Glucose: 154 (calc)
eAG (mmol/L): 8.5 (calc)

## 2019-07-12 LAB — MAGNESIUM: Magnesium: 1.7 mg/dL (ref 1.5–2.5)

## 2019-07-24 ENCOUNTER — Other Ambulatory Visit: Payer: Self-pay

## 2019-07-24 MED ORDER — GABAPENTIN 100 MG PO CAPS: ORAL_CAPSULE | ORAL | 1 refills | Status: DC

## 2019-08-18 ENCOUNTER — Other Ambulatory Visit: Payer: Self-pay | Admitting: Internal Medicine

## 2019-08-19 ENCOUNTER — Other Ambulatory Visit: Payer: Self-pay | Admitting: Internal Medicine

## 2019-08-19 MED ORDER — METOPROLOL SUCCINATE ER 25 MG PO TB24: ORAL_TABLET | ORAL | 0 refills | Status: DC

## 2019-08-26 ENCOUNTER — Other Ambulatory Visit: Payer: Self-pay | Admitting: Physician Assistant

## 2019-08-26 DIAGNOSIS — M79605 Pain in left leg: Secondary | ICD-10-CM

## 2019-08-26 DIAGNOSIS — M79604 Pain in right leg: Secondary | ICD-10-CM

## 2019-09-19 DIAGNOSIS — M5441 Lumbago with sciatica, right side: Secondary | ICD-10-CM | POA: Diagnosis not present

## 2019-09-19 DIAGNOSIS — Z135 Encounter for screening for eye and ear disorders: Secondary | ICD-10-CM | POA: Diagnosis not present

## 2019-09-19 DIAGNOSIS — M545 Low back pain: Secondary | ICD-10-CM | POA: Diagnosis not present

## 2019-09-19 DIAGNOSIS — M5442 Lumbago with sciatica, left side: Secondary | ICD-10-CM | POA: Diagnosis not present

## 2019-09-25 DIAGNOSIS — M545 Low back pain: Secondary | ICD-10-CM | POA: Diagnosis not present

## 2019-09-27 DIAGNOSIS — M79662 Pain in left lower leg: Secondary | ICD-10-CM | POA: Diagnosis not present

## 2019-10-07 NOTE — Progress Notes (Signed)
FOLLOW UP  Assessment and Plan:   Hypertension Well controlled with current medications  Monitor blood pressure at home; patient to call if consistently greater than 130/80 Continue DASH diet.   Reminder to go to the ER if any CP, SOB, nausea, dizziness, severe HA, changes vision/speech, left arm numbness and tingling and jaw pain.  Cholesterol Currently at goal of LDL <70; continue crestor but consider dose reduction if remains <40  Continue low cholesterol diet and exercise.  Check lipid panel.   Diabetes with diabetic chronic kidney disease Continue medication: metformin Continue diet and exercise.  Perform daily foot/skin check, notify office of any concerning changes.  Check A1C Not checking sugars at home - does have equipment, encouraged to check at least 1-2 x per week   CKD 1 associated with T2DM  Increase fluids, avoid NSAIDS, monitor sugars, will monitor  Vitamin D Def At goal at last visit; continue supplementation to maintain goal of 60-100  Vit D level  Insomnia/somnolensce due to shift work/RLS Continue trazodone, gabapentin as needed Continue requip for RLS unless improved with higher dose gabapentin ? Need sleep study, declines at this time  Continue diet and meds as discussed. Further disposition pending results of labs. Discussed med's effects and SE's.   Over 30 minutes of exam, counseling, chart review, and critical decision making was performed.   Future Appointments  Date Time Provider [place]  01/08/2020 11:00 AM [name], MD [facility] None    ----------------------------------------------------------------------------------------------------------------------  HPI 64 y.o. female  presents for 3 month follow up on hypertension, cholesterol, diabetes with CKD, weight and vitamin D deficiency.   She is followed by Dr. Stanford Breed for cerebrovascular disease with most recent dopplers in 2017 showing mild disease only, with  recommendation to continue ASA and statin.    She has intermittent lumbar pain and L extremity pain (posterior thigh, calf, aching). Following with Dr. Rip Harbour at [facility]. Had lumbar xrays and MRI that showed lumbar degenerative disc disease. Was recommended PT but never started due to cost concerns. She is taking meloxicam to manage.    she is prescribed phentermine for weight loss and for somnolence due to shift work, she reports taking 1/2 tab prior to work 5 days week, doesn't take on weekends.   She works from [employer]- 3-5:30 AM, taking the trazodone 150 1/2 at night that is helping. melatonin, taking gabapentin 200 mg at HS and lower back pain and some aching in legs if sits for extended periods (follows with ortho). She does have a history of RLS, taking requip 10 mg TID which is helpful, She is on an iron supplement 65mg  BID.   Lab Results  Component Value Date   IRON 51 06/04/2019   TIBC 291 06/04/2019   FERRITIN 41 06/04/2019   BMI is Body mass index is 25.08 kg/m., she is working on diet and exercise. Wt Readings from Last 3 Encounters:  10/10/19 120 lb (54.4 kg)  07/11/19 125 lb (56.7 kg)  06/04/19 125 lb (56.7 kg)   She has not been checking her BP at home, today their BP is BP: 118/64  She does not workout. She denies chest pain, shortness of breath, dizziness.   She is on cholesterol medication on rosuvastatin (taking 1/2 tab 20 mg daily) and denies myalgias. Her cholesterol is at goal. The cholesterol last visit was:   Lab Results  Component Value Date   CHOL 84 07/11/2019   HDL 47 (L) 07/11/2019   LDLCALC 28 07/11/2019  TRIG 33 07/11/2019   CHOLHDL 1.8 07/11/2019    She has not been working on diet and exercise for T2 diabetes With hyperlipidemia on crestor CAD on ASA Stage 1 CKD She is on ACE  on metformin - takes 2000 mg daily  Cinnamon Could not tolerate invokana denies increased appetite, nausea, paresthesia of the feet, polydipsia, polyuria, visual  disturbances and vomiting.  She reports she has not been checking her sugars despite being advised to do so. She denies any symptoms of hypoglycemia. She does have glucometer. Last A1C in the office was:  Lab Results  Component Value Date   HGBA1C 7.0 (H) 07/11/2019   She has GFR in CKD 1 stage, associated with T2DM monitored at this office:  Lab Results  Component Value Date   GFRAA 116 07/11/2019   Patient is on Vitamin D supplement and at goal at the last check:    Lab Results  Component Value Date   VD25OH 62 04/03/2019        Current Medications:  Current Outpatient Medications on File Prior to Visit  Medication Sig  . aspirin EC 81 MG tablet Take 81 mg by mouth daily.  . B Complex-C (SUPER B COMPLEX PO) Take 1 tablet by mouth daily. Takes every other day  . BAYER MICROLET LANCETS lancets Check blood sugar 1 time daily-DX-E11.9.  [name] Cholecalciferol (VITAMIN D PO) Take 2 capsules by mouth daily. Take 4000 units by mouth twice a day  . Cinnamon 500 MG capsule Take 1,000 mg by mouth daily.   . cyclobenzaprine (FLEXERIL) 10 MG tablet Take 1/2 to 1 tablet at Bedtime for Muscle Spasm  . Diclofenac Sodium 1.5 % SOLN Place a small amount of gel on the bilateral hands for hand arthritis  . famotidine (PEPCID) 40 MG tablet TAKE 1 TABLET DAILY FOR ACID, INDIGESTION AND HEARTBURN  . fluconazole (DIFLUCAN) 150 MG tablet Use once for yeast infection as needed  . gabapentin (NEURONTIN) 100 MG capsule TAKE 1-3 TABLETS AT NIGHT  . glucose blood (BAYER CONTOUR NEXT TEST) test strip Check blood sugar 1 time daily-DX-E11.9.  . lisinopril-hydrochlorothiazide (ZESTORETIC) 10-12.5 MG tablet Take 1 tablet Daily for BP & Diabetic Kidney Protection  . MAGNESIUM PO Take 2 tablets by mouth daily.  . meloxicam (MOBIC) 15 MG tablet Take 1/2 to 1 tablet daily with food for pain &  Inflammation - limit to maximum of 5 days /week  . metFORMIN (GLUCOPHAGE-XR) 500 MG 24 hr tablet Take 2 tablets 2 x /day with  Meals for Diabetes  . metoprolol succinate (TOPROL-XL) 25 MG 24 hr tablet Take 1 tablet Daily for BP  . Multiple Vitamin (MULITIVITAMIN WITH MINERALS) TABS Take 1 tablet by mouth daily.  [name] nystatin (MYCOSTATIN) 100000 UNIT/ML suspension 5 ml four times a day, retain in mouth as long as possible (Swish and Spit).  Use for 48 hours after symptoms resolve.  [name] OVER THE COUNTER MEDICATION Takes Omega-Q vitamin which contains Tumeric.  . potassium chloride SA (KLOR-CON M20) 20 MEQ tablet TAKE 1 TABLET BY MOUTH 3 TIMES A DAY AFTER MEALS  . Psyllium (METAMUCIL PO) Take 1 Dose by mouth daily.  [name] rOPINIRole (REQUIP) 0.5 MG tablet Take 1 tablet 3 x /day for Restless Legs  . rosuvastatin (CRESTOR) 40 MG tablet TAKE ONE TABLET DAILY OR AS DIRECTED FOR CHOLESTEROL  . traZODone (DESYREL) 150 MG tablet Take 1/2 to 1 tablet 1  to 2 hours before Bedtime  . triamcinolone cream (KENALOG) 0.1 % Apply 1  application topically 4 (four) times daily as needed.  [name] albuterol (PROVENTIL HFA;VENTOLIN HFA) 108 (90 Base) MCG/ACT inhaler Use 1  To 2 inhalations 5 minutes apart every 4 hours if needed to rescue asthma  . mometasone (NASONEX) 50 MCG/ACT nasal spray Place 2 sprays into the nose daily.   No current facility-administered medications on file prior to visit.     Allergies:  Allergies  Allergen Reactions  . Naprosyn [Naproxen] Other (See Comments)    GI upset     Medical History:  Past Medical History:  Diagnosis Date  . Allergic rhinitis   . ASTHMA   . BICUSPID AORTIC VALVE   . CEREBROVASCULAR DISEASE   . DEGENERATIVE JOINT DISEASE   . Diabetes mellitus   . DJD (degenerative joint disease)   . GERD   . HSV-1 (herpes simplex virus 1) infection   . HYPERLIPIDEMIA   . Hypertension   . Palpitation   . Vitamin D deficiency    Family history- Reviewed and unchanged Social history- Reviewed and unchanged   Review of Systems: Review of Systems  Constitutional: Positive for malaise/fatigue (daytime  fatigue/somnolensce ). Negative for weight loss.  HENT: Negative for hearing loss and tinnitus.   Eyes: Negative for blurred vision and double vision.  Respiratory: Negative for cough, shortness of breath and wheezing.   Cardiovascular: Negative for chest pain, palpitations, orthopnea, claudication and leg swelling.  Gastrointestinal: Negative for abdominal pain, blood in stool, constipation, diarrhea, heartburn, melena, nausea and vomiting.  Genitourinary: Negative.   Musculoskeletal: Positive for back pain (intermittent lumbar with radicular sx, follows sports med). Negative for falls, joint pain, myalgias and neck pain.  Skin: Negative for rash.  Neurological: Negative for dizziness, tingling, sensory change, weakness and headaches.  Endo/Heme/Allergies: Negative for polydipsia.  Psychiatric/Behavioral: Negative for depression and substance abuse. The patient has insomnia. The patient is not nervous/anxious.   All other systems reviewed and are negative.   Physical Exam: BP 118/64   Pulse 84   Temp (!) 97.5 F (36.4 C)   Wt 120 lb (54.4 kg)   SpO2 98%   BMI 25.08 kg/m  Wt Readings from Last 3 Encounters:  10/10/19 120 lb (54.4 kg)  07/11/19 125 lb (56.7 kg)  06/04/19 125 lb (56.7 kg)   General Appearance: Well nourished, in no apparent distress. Eyes: PERRLA, Right eye gaze permanently deviated, conjunctiva no swelling or erythema Sinuses: No Frontal/maxillary tenderness ENT/Mouth: Ext aud canals clear, TMs without erythema, bulging. No erythema, swelling, or exudate on post pharynx.  Tonsils not swollen or erythematous. Hearing normal.  Neck: Supple, thyroid normal.  Respiratory: Respiratory effort normal, BS equal bilaterally without rales, rhonchi, wheezing or stridor.  Cardio: RRR with no MRGs. Brisk peripheral pulses without edema.  Abdomen: Soft, + BS.  Non tender, no guarding, rebound, hernias, masses. Lymphatics: Non tender without lymphadenopathy.  Musculoskeletal:  Full ROM, no laxity or crepitus to bilateral knees, 5/5 strength, Normal gait, neg straight leg raise.  Skin: Warm, dry without rashes, lesions, ecchymosis.  Neuro: Cranial nerves intact. No cerebellar symptoms.  Psych: Awake and oriented X 3, normal affect, Insight and Judgment appropriate.    Izora Ribas, NP 11:05 AM [facility]

## 2019-10-10 ENCOUNTER — Encounter: Payer: Self-pay | Admitting: Adult Health

## 2019-10-10 ENCOUNTER — Other Ambulatory Visit: Payer: Self-pay

## 2019-10-10 ENCOUNTER — Ambulatory Visit: Payer: BC Managed Care – PPO | Admitting: Adult Health

## 2019-10-10 VITALS — BP 118/64 | HR 84 | Temp 97.5°F | Wt 120.0 lb

## 2019-10-10 DIAGNOSIS — I679 Cerebrovascular disease, unspecified: Secondary | ICD-10-CM

## 2019-10-10 DIAGNOSIS — E1169 Type 2 diabetes mellitus with other specified complication: Secondary | ICD-10-CM | POA: Diagnosis not present

## 2019-10-10 DIAGNOSIS — E559 Vitamin D deficiency, unspecified: Secondary | ICD-10-CM

## 2019-10-10 DIAGNOSIS — J45909 Unspecified asthma, uncomplicated: Secondary | ICD-10-CM

## 2019-10-10 DIAGNOSIS — Z79899 Other long term (current) drug therapy: Secondary | ICD-10-CM | POA: Diagnosis not present

## 2019-10-10 DIAGNOSIS — G4726 Circadian rhythm sleep disorder, shift work type: Secondary | ICD-10-CM

## 2019-10-10 DIAGNOSIS — I10 Essential (primary) hypertension: Secondary | ICD-10-CM | POA: Diagnosis not present

## 2019-10-10 DIAGNOSIS — E785 Hyperlipidemia, unspecified: Secondary | ICD-10-CM

## 2019-10-10 DIAGNOSIS — E1122 Type 2 diabetes mellitus with diabetic chronic kidney disease: Secondary | ICD-10-CM | POA: Diagnosis not present

## 2019-10-10 DIAGNOSIS — M5137 Other intervertebral disc degeneration, lumbosacral region: Secondary | ICD-10-CM | POA: Insufficient documentation

## 2019-10-10 DIAGNOSIS — N181 Chronic kidney disease, stage 1: Secondary | ICD-10-CM

## 2019-10-10 MED ORDER — PHENTERMINE HCL 37.5 MG PO TABS: ORAL_TABLET | ORAL | 1 refills | Status: DC

## 2019-10-10 NOTE — Patient Instructions (Signed)
Goals    . HEMOGLOBIN A1C < 7.0    . LDL CALC < 70           Degenerative Disk Disease  Degenerative disk disease is a condition caused by changes that occur in the spinal disks as a person ages. Spinal disks are soft and compressible disks located between the bones of your spine (vertebrae). These disks act like shock absorbers. Degenerative disk disease can affect the whole spine. However, the neck and lower back are most often affected. Many changes can occur in the spinal disks with aging, such as:  The spinal disks may dry and shrink.  Small tears may occur in the tough, outer covering of the disk (annulus).  The disk space may become smaller due to loss of water.  Abnormal growths in the bone (spurs) may occur. This can put pressure on the nerve roots exiting the spinal canal, causing pain.  The spinal canal may become narrowed. What are the causes? This condition may be caused by:  Normal degeneration with age.  Injuries.  Certain activities and sports that cause damage. What increases the risk? The following factors may make you more likely to develop this condition:  Being overweight.  Having a family history of degenerative disk disease.  Smoking.  Sudden injury.  Doing work that requires heavy lifting. What are the signs or symptoms? Symptoms of this condition include:  Pain that varies in intensity. Some people have no pain, while others have severe pain. The location of the pain depends on the part of your backbone that is affected. You may have: ? Pain in your neck or arm if a disk in your neck area is affected. ? Pain in your back, buttocks, or legs if a disk in your lower back is affected.  Pain that becomes worse while bending or reaching up, or with twisting movements.  Pain that may start gradually and then get worse as time passes. It may also start after a major or minor injury.  Numbness or tingling in the arms or legs. How is this  diagnosed? This condition may be diagnosed based on:  Your symptoms and medical history.  A physical exam.  Imaging tests, including: ? An X-ray of the spine. ? MRI. How is this treated? This condition may be treated with:  Medicines.  Rehabilitation exercises. These activities aim to strengthen muscles in your back and abdomen to better support your spine. If treatments do not help to relieve your symptoms or you have severe pain, you may need surgery. Follow these instructions at home: Medicines  Take over-the-counter and prescription medicines only as told by your health care provider.  Do not drive or use heavy machinery while taking prescription pain medicine.  If you are taking prescription pain medicine, take actions to prevent or treat constipation. Your health care provider may recommend that you: ? Drink enough fluid to keep your urine pale yellow. ? Eat foods that are high in fiber, such as fresh fruits and vegetables, whole grains, and beans. ? Limit foods that are high in fat and processed sugars, such as fried or sweet foods. ? Take an over-the-counter or prescription medicine for constipation. Activity  Rest as told by your health care provider.  Ask your health care provider what activities are safe for you. Return to your normal activities as directed.  Avoid sitting for a long time without moving. Get up to take short walks every 1-2 hours. This is important to improve blood  flow and breathing. Ask for help if you feel weak or unsteady.  Perform relaxation exercises as told by your health care provider.  Maintain good posture.  Do not lift anything that is heavier than 10 lb (4.5 kg), or the limit that you are told, until your health care provider says that it is safe.  Follow proper lifting and walking techniques as told by your health care provider. Managing pain, stiffness, and swelling   If directed, put ice on the painful area. Icing can help to  relieve pain. ? Put ice in a plastic bag. ? Place a towel between your skin and the bag. ? Leave the ice on for 20 minutes, 2-3 times a day.  If directed, apply heat to the painful area as often as told by your health care provider. Heat can reduce the stiffness of your muscles. Use the heat source that your health care provider recommends, such as a moist heat pack or a heating pad. ? Place a towel between your skin and the heat source. ? Leave the heat on for 20-30 minutes. ? Remove the heat if your skin turns bright red. This is especially important if you are unable to feel pain, heat, or cold. You may have a greater risk of getting burned. General instructions  Change your sitting, standing, and sleeping habits as told by your health care provider.  Avoid sitting in the same position for long periods of time. Change positions frequently.  Lose weight or maintain a healthy weight as told by your health care provider.  Do not use any products that contain nicotine or tobacco, such as cigarettes and e-cigarettes. If you need help quitting, ask your health care provider.  Wear supportive footwear.  Keep all follow-up visits as told by your health care provider. This is important. This may include visits for physical therapy. Contact a health care provider if you:  Have pain that does not go away within 1-4 weeks.  Lose your appetite.  Lose weight without trying. Get help right away if you:  Have severe pain.  Notice weakness in your arms, hands, or legs.  Begin to lose control of your bladder or bowel movements.  Have fevers or night sweats. Summary  Degenerative disk disease is a condition caused by changes that occur in the spinal disks as a person ages.  Degenerative disk disease can affect the whole spine. However, the neck and lower back are most often affected.  Take over-the-counter and prescription medicines only as told by your health care provider. This  information is not intended to replace advice given to you by your health care provider. Make sure you discuss any questions you have with your health care provider. Document Revised: 01/27/2017 Document Reviewed: 01/27/2017 Elsevier Patient Education  2020 [place].      Back Exercises The following exercises strengthen the muscles that help to support the trunk and back. They also help to keep the lower back flexible. Doing these exercises can help to prevent back pain or lessen existing pain.  If you have back pain or discomfort, try doing these exercises 2-3 times each day or as told by your health care provider.  As your pain improves, do them once each day, but increase the number of times that you repeat the steps for each exercise (do more repetitions).  To prevent the recurrence of back pain, continue to do these exercises once each day or as told by your health care provider. Do exercises exactly  as told by your health care provider and adjust them as directed. It is normal to feel mild stretching, pulling, tightness, or discomfort as you do these exercises, but you should stop right away if you feel sudden pain or your pain gets worse. Exercises Single knee to chest Repeat these steps 3-5 times for each leg: 1. Lie on your back on a firm bed or the floor with your legs extended. 2. Bring one knee to your chest. Your other leg should stay extended and in contact with the floor. 3. Hold your knee in place by grabbing your knee or thigh with both hands and hold. 4. Pull on your knee until you feel a gentle stretch in your lower back or buttocks. 5. Hold the stretch for 10-30 seconds. 6. Slowly release and straighten your leg. Pelvic tilt Repeat these steps 5-10 times: 1. Lie on your back on a firm bed or the floor with your legs extended. 2. Bend your knees so they are pointing toward the ceiling and your feet are flat on the floor. 3. Tighten your lower abdominal muscles  to press your lower back against the floor. This motion will tilt your pelvis so your tailbone points up toward the ceiling instead of pointing to your feet or the floor. 4. With gentle tension and even breathing, hold this position for 5-10 seconds. Cat-cow Repeat these steps until your lower back becomes more flexible: 1. Get into a hands-and-knees position on a firm surface. Keep your hands under your shoulders, and keep your knees under your hips. You may place padding under your knees for comfort. 2. Let your head hang down toward your chest. Contract your abdominal muscles and point your tailbone toward the floor so your lower back becomes rounded like the back of a cat. 3. Hold this position for 5 seconds. 4. Slowly lift your head, let your abdominal muscles relax and point your tailbone up toward the ceiling so your back forms a sagging arch like the back of a cow. 5. Hold this position for 5 seconds.  Press-ups Repeat these steps 5-10 times: 1. Lie on your abdomen (face-down) on the floor. 2. Place your palms near your head, about shoulder-width apart. 3. Keeping your back as relaxed as possible and keeping your hips on the floor, slowly straighten your arms to raise the top half of your body and lift your shoulders. Do not use your back muscles to raise your upper torso. You may adjust the placement of your hands to make yourself more comfortable. 4. Hold this position for 5 seconds while you keep your back relaxed. 5. Slowly return to lying flat on the floor.  Bridges Repeat these steps 10 times: 1. Lie on your back on a firm surface. 2. Bend your knees so they are pointing toward the ceiling and your feet are flat on the floor. Your arms should be flat at your sides, next to your body. 3. Tighten your buttocks muscles and lift your buttocks off the floor until your waist is at almost the same height as your knees. You should feel the muscles working in your buttocks and the back of  your thighs. If you do not feel these muscles, slide your feet 1-2 inches farther away from your buttocks. 4. Hold this position for 3-5 seconds. 5. Slowly lower your hips to the starting position, and allow your buttocks muscles to relax completely. If this exercise is too easy, try doing it with your arms crossed over your chest.  Abdominal crunches Repeat these steps 5-10 times: 1. Lie on your back on a firm bed or the floor with your legs extended. 2. Bend your knees so they are pointing toward the ceiling and your feet are flat on the floor. 3. Cross your arms over your chest. 4. Tip your chin slightly toward your chest without bending your neck. 5. Tighten your abdominal muscles and slowly raise your trunk (torso) high enough to lift your shoulder blades a tiny bit off the floor. Avoid raising your torso higher than that because it can put too much stress on your low back and does not help to strengthen your abdominal muscles. 6. Slowly return to your starting position. Back lifts Repeat these steps 5-10 times: 1. Lie on your abdomen (face-down) with your arms at your sides, and rest your forehead on the floor. 2. Tighten the muscles in your legs and your buttocks. 3. Slowly lift your chest off the floor while you keep your hips pressed to the floor. Keep the back of your head in line with the curve in your back. Your eyes should be looking at the floor. 4. Hold this position for 3-5 seconds. 5. Slowly return to your starting position. Contact a health care provider if:  Your back pain or discomfort gets much worse when you do an exercise.  Your worsening back pain or discomfort does not lessen within 2 hours after you exercise. If you have any of these problems, stop doing these exercises right away. Do not do them again unless your health care provider says that you can. Get help right away if:  You develop sudden, severe back pain. If this happens, stop doing the exercises right  away. Do not do them again unless your health care provider says that you can. This information is not intended to replace advice given to you by your health care provider. Make sure you discuss any questions you have with your health care provider. Document Revised: 06/08/2018 Document Reviewed: 11/03/2017 Elsevier Patient Education  [place].

## 2019-10-11 ENCOUNTER — Ambulatory Visit: Payer: BC Managed Care – PPO | Admitting: Adult Health

## 2019-10-11 LAB — CBC WITH DIFFERENTIAL/PLATELET
Absolute Monocytes: 533 cells/uL (ref 200–950)
Basophils Absolute: 43 cells/uL (ref 0–200)
Basophils Relative: 0.6 %
Eosinophils Absolute: 71 cells/uL (ref 15–500)
Eosinophils Relative: 1 %
HCT: 40.2 % (ref 35.0–45.0)
Hemoglobin: 12.8 g/dL (ref 11.7–15.5)
Lymphs Abs: 2016 cells/uL (ref 850–3900)
MCH: 30.8 pg (ref 27.0–33.0)
MCHC: 31.8 g/dL — ABNORMAL LOW (ref 32.0–36.0)
MCV: 96.9 fL (ref 80.0–100.0)
MPV: 10.8 fL (ref 7.5–12.5)
Monocytes Relative: 7.5 %
Neutro Abs: 4438 cells/uL (ref 1500–7800)
Neutrophils Relative %: 62.5 %
Platelets: 283 10*3/uL (ref 140–400)
RBC: 4.15 10*6/uL (ref 3.80–5.10)
RDW: 12.2 % (ref 11.0–15.0)
Total Lymphocyte: 28.4 %
WBC: 7.1 10*3/uL (ref 3.8–10.8)

## 2019-10-11 LAB — COMPLETE METABOLIC PANEL WITH GFR
AG Ratio: 1.8 (calc) (ref 1.0–2.5)
ALT: 29 U/L (ref 6–29)
AST: 20 U/L (ref 10–35)
Albumin: 4.3 g/dL (ref 3.6–5.1)
Alkaline phosphatase (APISO): 57 U/L (ref 37–153)
BUN/Creatinine Ratio: 36 (calc) — ABNORMAL HIGH (ref 6–22)
BUN: 17 mg/dL (ref 7–25)
CO2: 32 mmol/L (ref 20–32)
Calcium: 9.1 mg/dL (ref 8.6–10.4)
Chloride: 102 mmol/L (ref 98–110)
Creat: 0.47 mg/dL — ABNORMAL LOW (ref 0.50–0.99)
GFR, Est African American: 122 mL/min/{1.73_m2} (ref >=60)
GFR, Est Non African American: 105 mL/min/{1.73_m2} (ref >=60)
Globulin: 2.4 g/dL (calc) (ref 1.9–3.7)
Glucose, Bld: 96 mg/dL (ref 65–99)
Potassium: 4.9 mmol/L (ref 3.5–5.3)
Sodium: 139 mmol/L (ref 135–146)
Total Bilirubin: 0.2 mg/dL (ref 0.2–1.2)
Total Protein: 6.7 g/dL (ref 6.1–8.1)

## 2019-10-11 LAB — LIPID PANEL
Cholesterol: 89 mg/dL (ref <200)
HDL: 42 mg/dL — ABNORMAL LOW (ref >=50)
LDL Cholesterol (Calc): 34 mg/dL (calc)
Non-HDL Cholesterol (Calc): 47 mg/dL (calc) (ref <130)
Total CHOL/HDL Ratio: 2.1 (calc) (ref <5.0)
Triglycerides: 46 mg/dL (ref <150)

## 2019-10-11 LAB — HEMOGLOBIN A1C
Hgb A1c MFr Bld: 6.8 % of total Hgb — ABNORMAL HIGH (ref <5.7)
Mean Plasma Glucose: 148 (calc)
eAG (mmol/L): 8.2 (calc)

## 2019-10-11 LAB — TSH: TSH: 0.81 mIU/L (ref 0.40–4.50)

## 2019-10-11 LAB — MAGNESIUM: Magnesium: 2 mg/dL (ref 1.5–2.5)

## 2019-11-06 ENCOUNTER — Ambulatory Visit: Payer: BC Managed Care – PPO | Admitting: Adult Health Nurse Practitioner

## 2019-11-06 ENCOUNTER — Other Ambulatory Visit: Payer: Self-pay

## 2019-11-06 ENCOUNTER — Encounter: Payer: Self-pay | Admitting: Adult Health Nurse Practitioner

## 2019-11-06 VITALS — BP 130/80 | HR 93 | Temp 97.5°F | Ht <= 58 in | Wt 119.0 lb

## 2019-11-06 DIAGNOSIS — M5432 Sciatica, left side: Secondary | ICD-10-CM | POA: Diagnosis not present

## 2019-11-06 DIAGNOSIS — N181 Chronic kidney disease, stage 1: Secondary | ICD-10-CM

## 2019-11-06 DIAGNOSIS — G8929 Other chronic pain: Secondary | ICD-10-CM

## 2019-11-06 DIAGNOSIS — E1122 Type 2 diabetes mellitus with diabetic chronic kidney disease: Secondary | ICD-10-CM

## 2019-11-06 DIAGNOSIS — M545 Low back pain, unspecified: Secondary | ICD-10-CM

## 2019-11-06 DIAGNOSIS — I10 Essential (primary) hypertension: Secondary | ICD-10-CM

## 2019-11-06 MED ORDER — DEXAMETHASONE 0.5 MG PO TABS: ORAL_TABLET | ORAL | 0 refills | Status: DC

## 2019-11-06 NOTE — Patient Instructions (Addendum)
   Increase the number of times you take the gabapentin (Neurontin) 100mg .  Start taking a second dose of 100mg  during the day.  Make sure this does not make you sleepy if you are going to work or drive a car.  Do this for one week then consider adding adding another dose with a goal of taking this three times a day.   We are going to send in a steroid taper pack for you to take as well.  This could increase your blood glucose.

## 2019-11-06 NOTE — Progress Notes (Signed)
Assessment and Plan:  [place] was seen today for leg pain.  Diagnoses and all orders for this visit:  Left sided sciatica Chronic right-sided low back pain without sciatica Continue follow up with Ortho and PT Start back doing home exercises -     dexamethasone (DECADRON) 0.5 MG tablet; Take 1 tab 3 x day - 3 days, then 2 x day - 3 days, then 1 tab daily Change the way you take gabapentin 100mg .  Take one tablet at bedtime, increase to second tablet during the day for one week, if no side effects increase to TID. Discussed medication and side effects, do not drive until you know who this mediation effects you.  Essential hypertension Continue current medications: Monitor blood pressure at home; call if consistently over 130/80 Continue DASH diet.   Reminder to go to the ER if any CP, SOB, nausea, dizziness, severe HA, changes vision/speech, left arm numbness and tingling and jaw pain.  Type 2 diabetes mellitus with stage 1 chronic kidney disease, without long-term current use of insulin (HCC) Continue medications: metformin 500mg  two tablets BID Monitor blood glucose while taking dexamethasone. Discussed general issues about diabetes pathophysiology and management. Education: Reviewed 'ABCs' of diabetes management (respective goals in parentheses):  A1C (<7), blood pressure (<130/80), and cholesterol (LDL <70) Dietary recommendations Encouraged aerobic exercise.  Discussed foot care, check daily Yearly retinal exam Dental exam every 6 months Monitor blood glucose, discussed goal for patient       Further disposition pending results of labs. Discussed med's effects and SE's.   Over 30 minutes of face to face interview, exam, counseling, chart review, and critical decision making was performed.   Future Appointments  Date Time Provider [place]  11/16/2019  8:00 AM Hosie Spangle [facility]  01/08/2020 11:00 AM [name], MD [facility] None     ------------------------------------------------------------------------------------------------------------------   HPI 64 y.o.female presents for evaluation of lower leg pain bilateral.  It is intermittent 8-10 and sharp pains that is increasing to constant pain and is at a 10 at night.  She is not able to tolerating sitting up for any extended period as this makes her pain worse.  She reports that at night it is the worse.   She reports that the meloxicam did not help her.  She reports the tramadol she has taken but also did not help.  She tried flexeril but this knocked her out and she was not able to take this.  Elevating her legs tends to help the most for her pain. She is taking Gabpentin 200mg  at night and she reports it makes her groggy in the morning and this makes it dificult for her to work.  She did not take the 200mg  last week has been.  She reports she is working third shift and get home between 2am-4am. She is also taking requip 10mg  which is helping some. She has been following with orthopedics and she has therapy starting on October 1st.  Past Medical History:  Diagnosis Date  . Allergic rhinitis   . ASTHMA   . BICUSPID AORTIC VALVE   . CEREBROVASCULAR DISEASE   . DEGENERATIVE JOINT DISEASE   . Diabetes mellitus   . DJD (degenerative joint disease)   . GERD   . HSV-1 (herpes simplex virus 1) infection   . HYPERLIPIDEMIA   . Hypertension   . Palpitation   . Vitamin D deficiency      Allergies  Allergen Reactions  . Naprosyn [Naproxen] Other (See  Comments)    GI upset    Current Outpatient Medications on File Prior to Visit  Medication Sig  . aspirin EC 81 MG tablet Take 81 mg by mouth daily.  . B Complex-C (SUPER B COMPLEX PO) Take 1 tablet by mouth daily. Takes every other day  . BAYER MICROLET LANCETS lancets Check blood sugar 1 time daily-DX-E11.9.  [name] Cholecalciferol (VITAMIN D PO) Take 2 capsules by mouth daily. Take 4000 units by mouth twice a day  .  Cinnamon 500 MG capsule Take 1,000 mg by mouth daily.   . cyclobenzaprine (FLEXERIL) 10 MG tablet Take 1/2 to 1 tablet at Bedtime for Muscle Spasm  . Diclofenac Sodium 1.5 % SOLN Place a small amount of gel on the bilateral hands for hand arthritis  . famotidine (PEPCID) 40 MG tablet TAKE 1 TABLET DAILY FOR ACID, INDIGESTION AND HEARTBURN  . fluconazole (DIFLUCAN) 150 MG tablet Use once for yeast infection as needed  . gabapentin (NEURONTIN) 100 MG capsule TAKE 1-3 TABLETS AT NIGHT  . glucose blood (BAYER CONTOUR NEXT TEST) test strip Check blood sugar 1 time daily-DX-E11.9.  . lisinopril-hydrochlorothiazide (ZESTORETIC) 10-12.5 MG tablet Take 1 tablet Daily for BP & Diabetic Kidney Protection  . MAGNESIUM PO Take 2 tablets by mouth daily.  . meloxicam (MOBIC) 15 MG tablet Take 1/2 to 1 tablet daily with food for pain &  Inflammation - limit to maximum of 5 days /week  . metFORMIN (GLUCOPHAGE-XR) 500 MG 24 hr tablet Take 2 tablets 2 x /day with Meals for Diabetes  . metoprolol succinate (TOPROL-XL) 25 MG 24 hr tablet Take 1 tablet Daily for BP  . Multiple Vitamin (MULITIVITAMIN WITH MINERALS) TABS Take 1 tablet by mouth daily.  [name] nystatin (MYCOSTATIN) 100000 UNIT/ML suspension 5 ml four times a day, retain in mouth as long as possible (Swish and Spit).  Use for 48 hours after symptoms resolve.  [name] OVER THE COUNTER MEDICATION Takes Omega-Q vitamin which contains Tumeric.  . phentermine (ADIPEX-P) 37.5 MG tablet Take 1/2 to 1 tablet every morning for dieting & weightloss  . potassium chloride SA (KLOR-CON M20) 20 MEQ tablet TAKE 1 TABLET BY MOUTH 3 TIMES A DAY AFTER MEALS  . Psyllium (METAMUCIL PO) Take 1 Dose by mouth daily.  [name] rOPINIRole (REQUIP) 0.5 MG tablet Take 1 tablet 3 x /day for Restless Legs  . rosuvastatin (CRESTOR) 40 MG tablet TAKE ONE TABLET DAILY OR AS DIRECTED FOR CHOLESTEROL  . traZODone (DESYREL) 150 MG tablet Take 1/2 to 1 tablet 1  to 2 hours before Bedtime  . triamcinolone  cream (KENALOG) 0.1 % Apply 1 application topically 4 (four) times daily as needed.  [name] albuterol (PROVENTIL HFA;VENTOLIN HFA) 108 (90 Base) MCG/ACT inhaler Use 1  To 2 inhalations 5 minutes apart every 4 hours if needed to rescue asthma  . mometasone (NASONEX) 50 MCG/ACT nasal spray Place 2 sprays into the nose daily.   No current facility-administered medications on file prior to visit.    ROS: all negative except above.   Physical Exam:  Wt 119 lb (54 kg)   BMI 24.87 kg/m   General Appearance: Well nourished, in no apparent distress. Eyes: PERRLA, EOMs, conjunctiva no swelling or erythema Sinuses: No Frontal/maxillary tenderness ENT/Mouth: Ext aud canals clear, TMs without erythema, bulging. No erythema, swelling, or exudate on post pharynx.  Tonsils not swollen or erythematous. Hearing normal.  Neck: Supple, thyroid normal.  Respiratory: Respiratory effort normal, BS equal bilaterally without rales, rhonchi, wheezing or  stridor.  Cardio: RRR with no MRGs. Brisk peripheral pulses without edema.  Abdomen: Soft, + BS.  Non tender, no guarding, rebound, hernias, masses. Lymphatics: Non tender without lymphadenopathy.  Musculoskeletal: Full ROM, 5/5 strength, normal gait.  Skin: Warm, dry without rashes, lesions, ecchymosis.  Neuro: Cranial nerves intact. Normal muscle tone, no cerebellar symptoms. Sensation intact.  Psych: Awake and oriented X 3, normal affect, Insight and Judgment appropriate.     Garnet Sierras, NP 8:49 AM [facility]

## 2019-11-16 ENCOUNTER — Other Ambulatory Visit: Payer: Self-pay

## 2019-11-16 ENCOUNTER — Ambulatory Visit: Payer: BC Managed Care – PPO | Attending: Family Medicine | Admitting: Physical Therapy

## 2019-11-16 ENCOUNTER — Encounter: Payer: Self-pay | Admitting: Physical Therapy

## 2019-11-16 DIAGNOSIS — R262 Difficulty in walking, not elsewhere classified: Secondary | ICD-10-CM | POA: Insufficient documentation

## 2019-11-16 DIAGNOSIS — M79662 Pain in left lower leg: Secondary | ICD-10-CM | POA: Diagnosis not present

## 2019-11-16 DIAGNOSIS — M25551 Pain in right hip: Secondary | ICD-10-CM | POA: Diagnosis not present

## 2019-11-16 DIAGNOSIS — M25552 Pain in left hip: Secondary | ICD-10-CM | POA: Diagnosis not present

## 2019-11-16 NOTE — Therapy (Signed)
[place], [place], [address] Phone: [phone]   Fax:  [phone]  Physical Therapy Evaluation  Patient Details  Name: [name] MRN: [id number] Date of Birth: 05/02/1955 Referring Provider (PT): Rhina Brackett, MD   Encounter Date: 11/16/2019   PT End of Session - 11/16/19 0743    Visit Number 1    Number of Visits 8    Date for PT Re-Evaluation 01/11/20    Authorization Type BCBS    PT Start Time 0744    PT Stop Time 0832    PT Time Calculation (min) 48 min    Activity Tolerance Patient tolerated treatment well    Behavior During Therapy [facility] for tasks assessed/performed           Past Medical History:  Diagnosis Date  . Allergic rhinitis   . ASTHMA   . BICUSPID AORTIC VALVE   . CEREBROVASCULAR DISEASE   . DEGENERATIVE JOINT DISEASE   . Diabetes mellitus   . DJD (degenerative joint disease)   . GERD   . HSV-1 (herpes simplex virus 1) infection   . HYPERLIPIDEMIA   . Hypertension   . Palpitation   . Vitamin D deficiency     Past Surgical History:  Procedure Laterality Date  . ABDOMINAL HYSTERECTOMY    . repair of trigger finger      There were no vitals filed for this visit.    Subjective Assessment - 11/16/19 0749    Subjective Pt. is a 64 y/o female referred to PT with c/o chronic bilateral leg/left calf region pain. Pt. reports pain started in left proximal lateral calf region around March of this year and she subsequently started to experience symptoms more proximally into hip region . She started having symptoms on right side a few months later but left side is worse. No mechanism of injury noted though she does have a history of fall with left ankle injury about 2 years ago with X-rays at the time (-) for fx. Pt. had lumbar MRI last month which showed DDD and facet arthropathy per MRI report but was (-) for significant stenosis/neurologic compression. Her primary  associated complaint is pain in her ischial region with sitting.    Pertinent History Diabetic, CKD stage 1, HTN, bicuspid aortic valve, cerebrovascular disease    Limitations Sitting;Lifting;Standing;Walking    Diagnostic tests MRI, X-rays    Patient Stated Goals Get rid of pain    Currently in Pain? Yes    Pain Score 5     Pain Location Leg    Pain Orientation Left;Lateral;Lower    Pain Descriptors / Indicators Throbbing    Pain Type Chronic pain    Pain Radiating Towards radiates up leg toard hip    Pain Onset More than a month ago    Pain Frequency Intermittent    Aggravating Factors  sitting    Pain Relieving Factors getting up and moving    Effect of Pain on Daily Activities limits positional tolerance              [facility] PT Assessment - 11/16/19 0001      Assessment   Medical Diagnosis Bilateral ischial tuberosity and left proximal lateral calf pain/concern peroneal strain    Referring Provider (PT) Rhina Brackett, MD    Onset Date/Surgical Date 04/16/19    Prior Therapy none for current complaints      Precautions   Precautions None      Restrictions  Weight Bearing Restrictions No      Balance Screen   Has the patient fallen in the past 6 months No      [place] residence    Type of [place] to enter    Entrance Stairs-Number of Steps 5    Entrance Stairs-Rails Right      Prior Function   Level of Independence Independent with basic ADLs;Independent with community mobility without device    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 [employer]   Overall Cognitive Status Within Functional Limits for tasks assessed      Observation/Other Assessments   Focus on Therapeutic Outcomes ([place] not set up at time of eval     Sensation   Light Touch Appears Intact      ROM / Strength   AROM / PROM / Strength AROM;PROM;Strength      AROM    Overall AROM Comments Bilateral hip AROM/PROM grossly WFL    AROM Assessment Site Hip;Knee;Ankle;Lumbar    Right/Left Hip --    Right/Left Knee --    Right/Left Ankle Right;Left    Right Ankle Dorsiflexion 6    Right Ankle Plantar Flexion 50    Right Ankle Inversion 34    Right Ankle Eversion 14    Left Ankle Dorsiflexion 2    Left Ankle Plantar Flexion 50    Left Ankle Inversion 30    Left Ankle Eversion 16    Lumbar Flexion 90    Lumbar Extension 10    Lumbar - Right Side Bend 19    Lumbar - Left Side Bend 20    Lumbar - Right Rotation 30%    Lumbar - Left Rotation 40%      Strength   Strength Assessment Site Hip;Knee;Ankle    Right/Left Hip Right;Left    Right Hip Flexion 4/5    Right Hip Extension 4/5    Right Hip External Rotation  5/5    Right Hip Internal Rotation 5/5    Right Hip ABduction 4+/5    Right Hip ADduction 5/5    Left Hip Flexion 4/5    Left Hip Extension 4/5    Left Hip External Rotation 4+/5    Left Hip Internal Rotation 5/5    Left Hip ABduction 4/5    Left Hip ADduction 4+/5    Right/Left Knee Right;Left    Right Knee Flexion 5/5    Right Knee Extension 5/5    Left Knee Flexion 5/5    Left Knee Extension 5/5    Right/Left Ankle Right;Left    Right Ankle Dorsiflexion 5/5    Right Ankle Plantar Flexion 4+/5    Right Ankle Inversion 4+/5    Right Ankle Eversion 4+/5    Left Ankle Dorsiflexion 5/5    Left Ankle Plantar Flexion 4/5    Left Ankle Inversion 4+/5    Left Ankle Eversion 4/5      Flexibility   Soft Tissue Assessment /Muscle Length --   tight left>right hamstring and glut, piriformis     Palpation   Palpation comment Mild tenderness left>right ichial tuberosity region, no peroneal tenderness noted on left, mild left>right hip abductor tenderness      Special Tests   Other special tests SLR (-)      Ambulation/Gait   Gait Comments Pt. ambulates independently without AD, mild antalgia  and forward flexed posture with gait                         Objective measurements completed on examination: See above findings.       [facility] Adult PT Treatment/Exercise - 11/16/19 0001      Exercises   Exercises --   HEP handout review                 PT Education - 11/16/19 0840    Education Details eval findings, symptom etiology, HEP, POC    Person(s) Educated Patient    Methods Explanation;Demonstration;Verbal cues;Handout    Comprehension Verbalized understanding            PT Short Term Goals - 11/16/19 3267      PT SHORT TERM GOAL #1   Title Independent with initial HEP    Time 4    Period Weeks    Status New      PT SHORT TERM GOAL #2   Title Tolerate sitting for car travel/commute periods at least 30-40 min with gluteal pain decreased at least 30% from baseline status    Baseline limited sitting tolerance    Time 3    Period Weeks    Status New             PT Long Term Goals - 11/16/19 0913      PT LONG TERM GOAL #1   Title Tolerate community level ambulation and standing/lifting for work duties with left calf and bilat hip pain 3/10 or less at worst    Baseline 5/10 pain this AM but increases up to 8/10    Status New    Target Date 01/11/20      PT LONG TERM GOAL #2   Title Tolerate sitting as needed for car travel, eating meals without limitation due to hip pain    Baseline limited sitting tolerance    Time 8    Period Weeks    Status New    Target Date 01/11/20      PT LONG TERM GOAL #3   Title Increase bilateral hip extension and abduction strength as well as left ankle strength grossly 1/2 MMT grade to improve ability for transfers and ankle stability for ambulation    Baseline see objective    Time 8    Period Weeks    Status New    Target Date 01/11/20      PT LONG TERM GOAL #4   Title Independent with advanced HEP for continued progess after d/c from formal therapy    Time 8    Period Weeks    Status New    Target Date 01/11/20                   Plan - 11/16/19 0841    Clinical Impression Statement Pt. presents with bilateral left>right gluteal/proximal posterior and lateral thigh pain as well as left lateral lower leg pain. No symptoms reproduction with lumbar ROM and MRI (-) for neurologic compression for radicular etiology. Symptoms could be consistent with ischial bursitis vs. muscular pain for more proximal symptoms along with residual peroneal pain for lower leg symptoms with associated muscle weakness and tightness. Pt. would benefit from PT to help relieve pain and address associated functional limitations for positional and activity tolerance.    Personal Factors and Comorbidities Comorbidity 2    Comorbidities see PMH    Examination-Activity Limitations Sit;Sleep;Lift;Squat;Locomotion Level;Stand  Examination-Participation Restrictions Driving;Community Activity;Occupation    Stability/Clinical Decision Making Evolving/Moderate complexity    Clinical Decision Making Moderate    Rehab Potential Good    PT Frequency --   8 visits   PT Duration 8 weeks    PT Treatment/Interventions ADLs/Self Care Home Management;Cryotherapy;Ultrasound;Electrical Stimulation;Iontophoresis 4mg /ml Dexamethasone;Moist Heat;Therapeutic activities;Functional mobility training;Gait training;Therapeutic exercise;Neuromuscular re-education;Patient/family education;Manual techniques;Dry needling;Taping    PT Next Visit Plan review and update HEP as needed, stretch hamstrings, gluts/piriformis, left gastroc, foam roll gluts, hamstrings and left peroneals, progress hip and ankle strengthenig as tolerated pending pain    PT Home Exercise Plan Access code: [email or web address]    Consulted and Agree with Plan of Care Patient           Patient will benefit from skilled therapeutic intervention in order to improve the following deficits and impairments:  Pain, Impaired flexibility, Decreased strength, Decreased activity tolerance, Difficulty  walking  Visit Diagnosis: Pain in right hip  Pain in left hip  Pain in left lower leg  Difficulty in walking, not elsewhere classified     Problem List Patient Active Problem List   Diagnosis Date Noted  . Degeneration of lumbar or lumbosacral intervertebral disc 10/10/2019  . Type 2 diabetes with stage 1 chronic kidney disease GFR>90 ([place]) 04/22/2015  . Esotropia of right eye 04/22/2015  . Palpitations 04/11/2015  . Abnormal electrocardiogram 04/11/2015  . Sleep disorder, shift-work 10/13/2014  . Left carotid bruit 02/14/2014  . Medication management 02/14/2014  . Essential hypertension 07/27/2013  . Hyperlipidemia associated with type 2 diabetes mellitus ([place]) 02/01/2013  . Vitamin D deficiency 02/01/2013  . BICUSPID AORTIC VALVE 07/03/2008  . Cerebrovascular disease 07/02/2008  . Asthma 07/02/2008  . GERD 07/02/2008  . Osteoarthritis 07/02/2008   Beaulah Dinning, PT, DPT 11/16/19 9:22 AM  [facility] [address] [place], [place], [address] Phone: [phone]   Fax:  [phone]  Name: [name] MRN: [id number] Date of Birth: [birth year]

## 2019-11-18 ENCOUNTER — Other Ambulatory Visit: Payer: Self-pay | Admitting: Internal Medicine

## 2019-11-18 DIAGNOSIS — M79604 Pain in right leg: Secondary | ICD-10-CM

## 2019-11-18 DIAGNOSIS — M79605 Pain in left leg: Secondary | ICD-10-CM

## 2019-11-23 ENCOUNTER — Ambulatory Visit: Payer: BC Managed Care – PPO | Admitting: Physical Therapy

## 2019-12-06 IMAGING — DX DG ANKLE COMPLETE 3+V*L*
3 series · 3 of 3 positions shown · non-contrast
Comparison: None.

CLINICAL DATA: Fall with left ankle pain

EXAM:
LEFT ANKLE COMPLETE - 3+ VIEW

[ankle ap]
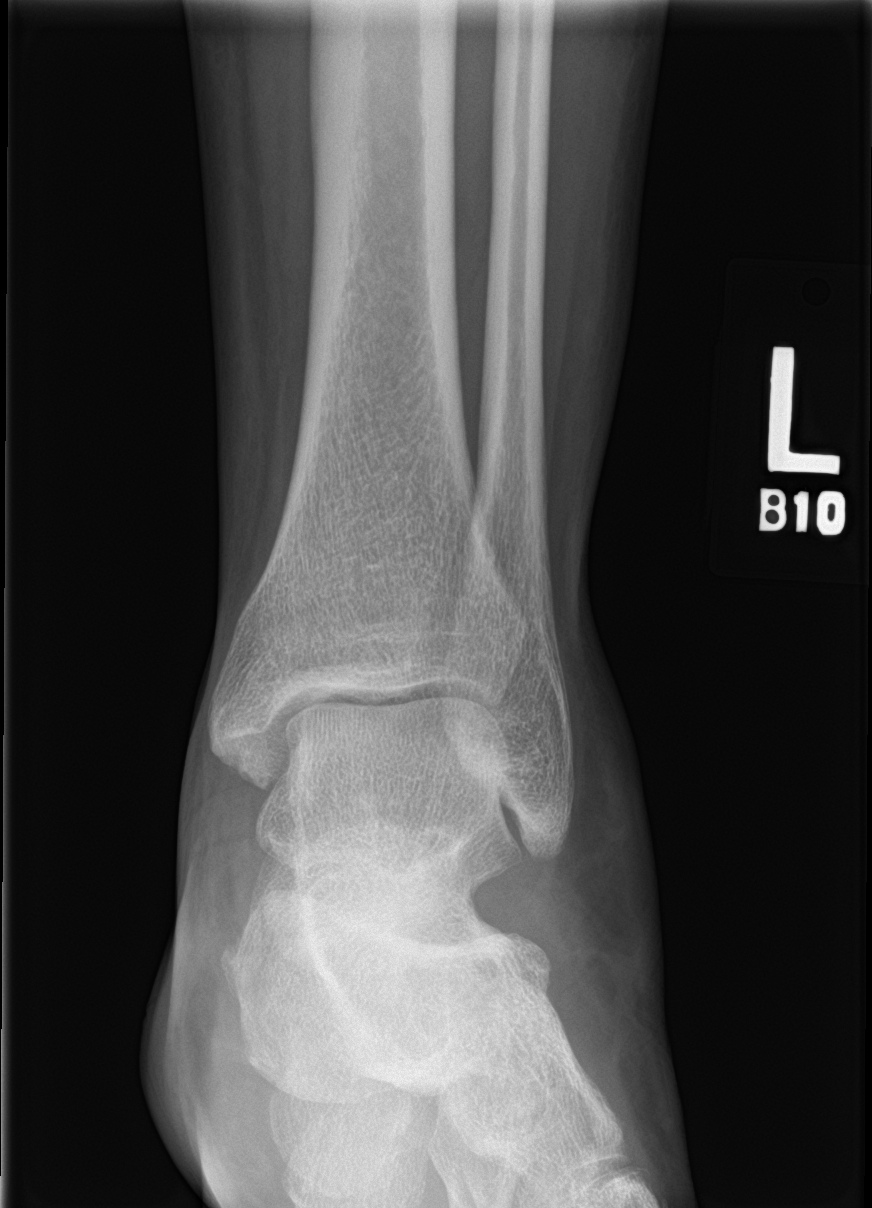

[ankle obl]
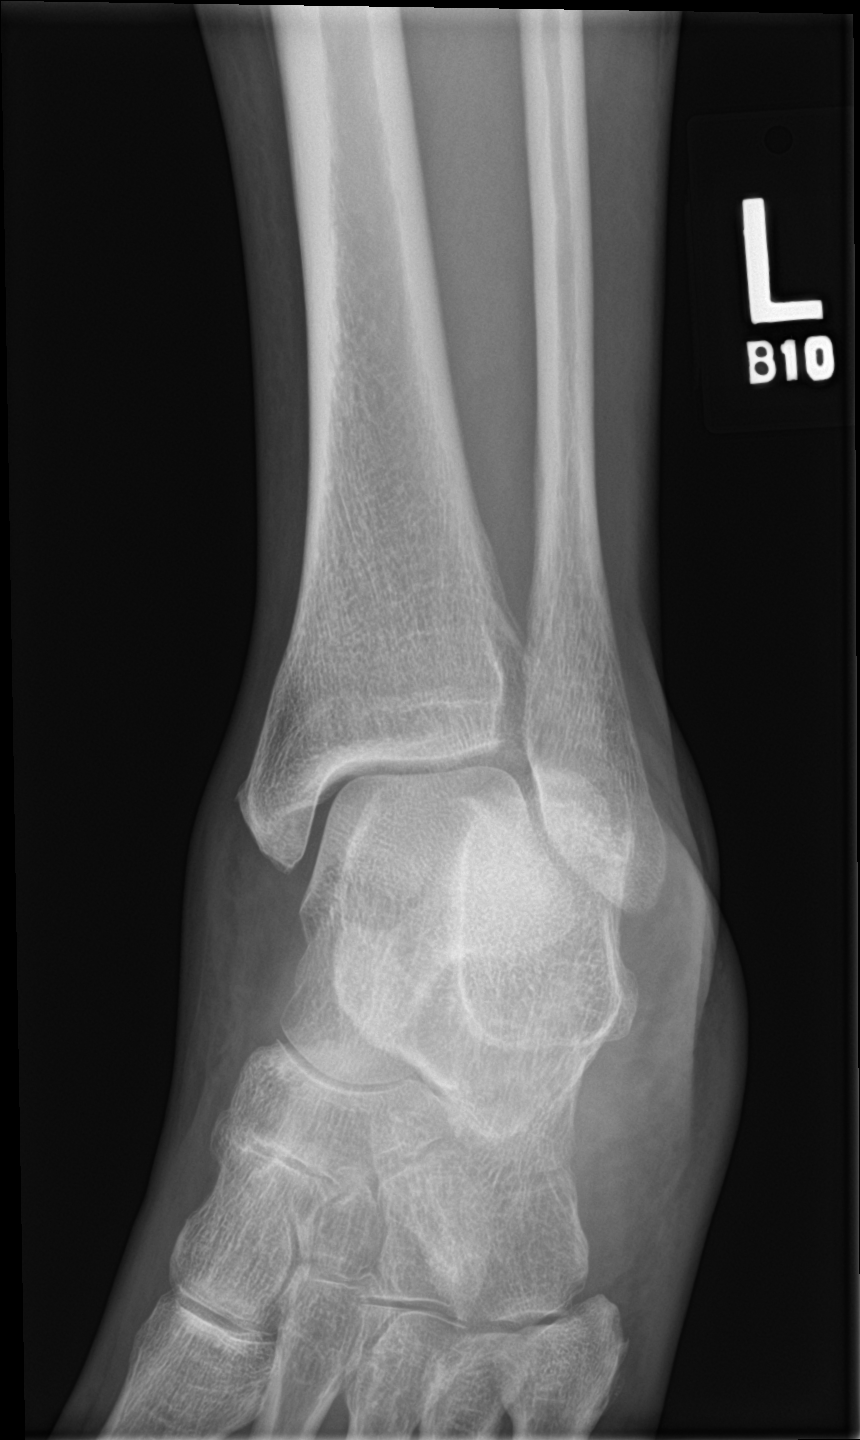

[ankle lat]
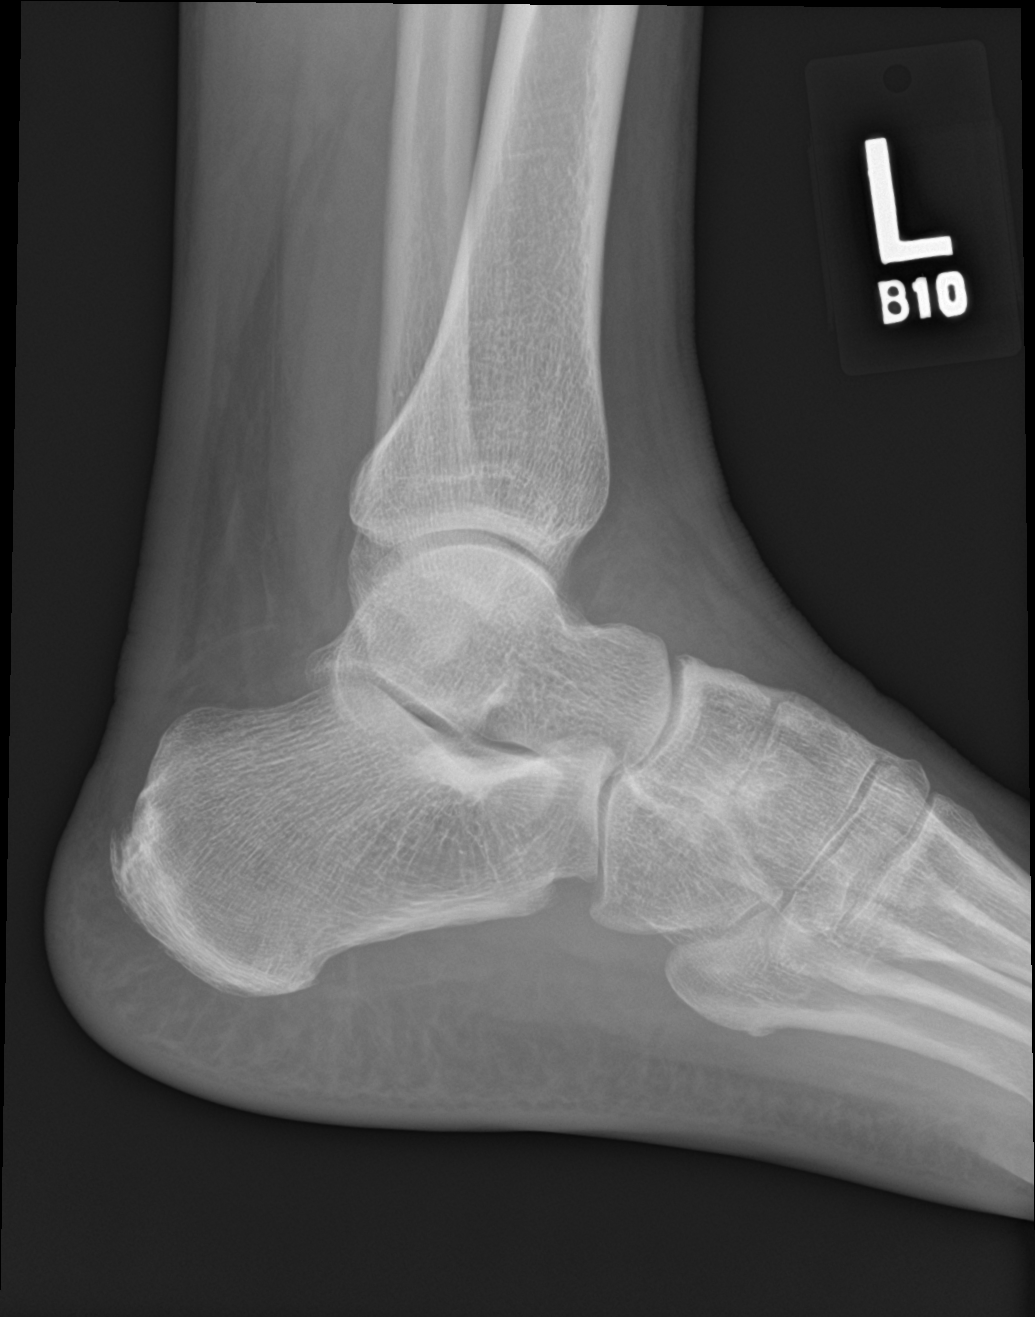

[3 of 3 positions shown; findings below may reference images not displayed]

FINDINGS: No fracture or dislocation. Moderate soft tissue swelling over the
lateral malleolus.
IMPRESSION: No acute osseous abnormality. Moderate left lateral malleolar soft
tissue swelling.

## 2019-12-07 ENCOUNTER — Ambulatory Visit: Payer: BC Managed Care – PPO | Admitting: Physical Therapy

## 2019-12-30 ENCOUNTER — Other Ambulatory Visit: Payer: Self-pay | Admitting: Internal Medicine

## 2020-01-02 ENCOUNTER — Other Ambulatory Visit: Payer: Self-pay

## 2020-01-02 MED ORDER — METOPROLOL SUCCINATE ER 25 MG PO TB24
ORAL_TABLET | ORAL | 3 refills | Status: DC
Start: 2020-01-02 — End: 2020-11-17

## 2020-01-02 MED ORDER — LISINOPRIL-HYDROCHLOROTHIAZIDE 10-12.5 MG PO TABS: ORAL_TABLET | ORAL | 1 refills | Status: DC

## 2020-01-04 ENCOUNTER — Other Ambulatory Visit: Payer: Self-pay | Admitting: Internal Medicine

## 2020-01-04 DIAGNOSIS — K219 Gastro-esophageal reflux disease without esophagitis: Secondary | ICD-10-CM

## 2020-01-07 ENCOUNTER — Encounter: Payer: Self-pay | Admitting: Internal Medicine

## 2020-01-07 NOTE — Patient Instructions (Signed)

## 2020-01-07 NOTE — Progress Notes (Signed)
Annual Screening/Preventative Visit & Comprehensive Evaluation &  Examination      This very nice 64 y.o.  MBF presents for a Screening /Preventative Visit & comprehensive evaluation and management of multiple medical co-morbidities.  Patient has been followed for HTN, HLD, T2_NIDDM  and Vitamin D Deficiency. Patient has GERD controlled with diet & her famotidine.        HTN predates since  1996. Patient's BP has been controlled at home and patient denies any cardiac symptoms as chest pain, palpitations, shortness of breath, dizziness or ankle swelling. Today's BP was initially slightly elevated & rechecked at goal -  136/84.      Patient's hyperlipidemia is controlled with diet /Rosuvastatin. Patient denies myalgias or other medication SE's. Last lipids were at goal:  Lab Results  Component Value Date   CHOL 89 10/10/2019   HDL 42 (L) 10/10/2019   LDLCALC 34 10/10/2019   TRIG 46 10/10/2019   CHOLHDL 2.1 10/10/2019        Patient has hx/o T2_NIDDM predating since 2003 and patient denies reactive hypoglycemic symptoms, visual blurring, diabetic polys or paresthesias. Last A1c was not at goal:  Lab Results  Component Value Date   HGBA1C 6.8 (H) 10/10/2019        Finally, patient has history of Vitamin D Deficiency ("30" /2008) and last Vitamin D was at goal:  Lab Results  Component Value Date   VD25OH 62 04/03/2019    Current Outpatient Medications on File Prior to Visit  Medication Sig  . albuterol HFA  inhaler Use 1  To 2 inhalations 5 minutes apart every 4 hours   . aspirin EC 81 MG tablet Take  daily.  . SUPER B COMPLEX  Take 1 tablet every other day  . VITAMIN D Take 4000 units by mouth twice a day  . Cinnamon 500 MG capsule Take 1,000 mg daily.   . cyclobenzaprine 10 MG tablet Take 1/2 to 1 tablet at Bedtime for Muscle Spasm  . Diclofenac Sodium 1.5 % SOLN Place a small amount of gel on the bilateral hands for hand arthritis  . famotidine 40 MG tablet TAKE 1  TABLET DAILY   . fluconazole 150 MG tablet Use once for yeast infection as needed  . gabapentin  100 MG capsule TAKE 1-3 TABLETS AT NIGHT  . lisinopril-hctz10-12.5 MG tablet Take       1 tablet       Daily   . MAGNESIUM  Take 2 tablets  daily.  . meloxicam  15 MG tablet Take 1/2 to 1 tablet daily with food   . metFORMIN-XR 500 MG  Take 2 tablets 2 x /day with Meals for Diabetes  . metoprolol succ-XL 25 MG  Take      1 tablet     Daily       for BP  . NASONEXnasal spray Place 2 sprays into the nose daily.  . MULTI-VIT w/MINERALS Take 1 tablet bdaily.  [name] nystatin  100000 UNIT/ML susp 5 ml four times a day  . Omega-Q vitamin contains Tumeric Takes daily  . phentermine 37.5 MG tablet Take 1/2 to 1 tablet every morning   . potassium chloride 20 MEQ t TAKE 1 TABLET  3 TIMES A DAY   . METAMUCIL Take 1 Dose  daily.  . REQUIP 0.5 MG tablet TAKE 1 TABLET 3 TIMES A DAY   . rosuvastatin  40 MG tablet TAKE ONE TABLET DAILY   . traZODone (DESYREL) 150 MG tablet  Take 1/2 to 1 tablet 1  to 2 hours before Bedtime  . triamcinolone cream (KENALOG) 0.1 % Apply 1 application topically 4 times daily a    Allergies  Allergen Reactions  . Naprosyn [Naproxen] Other (See Comments)    GI upset    Past Medical History:  Diagnosis Date  . Allergic rhinitis   . ASTHMA   . BICUSPID AORTIC VALVE   . CEREBROVASCULAR DISEASE   . DEGENERATIVE JOINT DISEASE   . Diabetes mellitus   . DJD (degenerative joint disease)   . GERD   . HSV-1 (herpes simplex virus 1) infection   . HYPERLIPIDEMIA   . Hypertension   . Palpitation   . Vitamin D deficiency    Health Maintenance  Topic Date Due  . PAP SMEAR-Modifier  07/27/2016  . INFLUENZA VACCINE  09/16/2019  . FOOT EXAM  12/25/2019  . HEMOGLOBIN A1C  04/11/2020  . OPHTHALMOLOGY EXAM  07/04/2020  . MAMMOGRAM  02/18/2021  . COLONOSCOPY  01/27/2025  . COVID-19 Vaccine  Completed  . Hepatitis C Screening  Completed  . HIV Screening  Completed   Immunization  History  Administered Date(s) Administered  . DT (Pediatric) 10/11/2014  . DTaP 03/18/2004  . Influenza Inj Mdck Quad With Preservative 11/17/2016  . Influenza Split 11/15/2013, 11/30/2014  . Influenza Whole 11/02/2012  . Influenza,inj,Quad PF,6+ Mos 10/21/2017, 09/30/2018  . Influenza-Unspecified 10/31/2015, 10/21/2017  . Moderna SARS-COVID-2 Vaccination 04/15/2019, 05/19/2019  . PPD Test 07/30/2013, 10/11/2014, 11/06/2015, 11/17/2016, 12/12/2017, 12/27/2018  . Pneumococcal Conjugate-13 11/15/2013, 11/17/2016  . Pneumococcal Polysaccharide-23 05/16/1996  . Zoster Recombinat (Shingrix) 01/02/2018, 04/05/2018, 04/19/2018   Last Colon - 01/28/2015 - Dr Earlean Shawl - Recc 10 yr f/u - due Jan 2027  Last MGM -  02/19/2019  Past Surgical History:  Procedure Laterality Date  . ABDOMINAL HYSTERECTOMY    . repair of trigger finger     Family History  Problem Relation Age of Onset  . Coronary artery disease Mother   . Heart disease Mother   . Hypertension Mother   . Diabetes Father   . Hypertension Father   . Breast cancer Maternal Aunt    Social History   Tobacco Use  . Smoking status: Never Smoker  . Smokeless tobacco: Never Used  Substance Use Topics  . Alcohol use: Yes    Comment: very rarely  . Drug use: No    ROS Constitutional: Denies fever, chills, weight loss/gain, headaches, insomnia,  night sweats, and change in appetite. Does c/o fatigue. Eyes: Denies redness, blurred vision, diplopia, discharge, itchy, watery eyes.  ENT: Denies discharge, congestion, post nasal drip, epistaxis, sore throat, earache, hearing loss, dental pain, Tinnitus, Vertigo, Sinus pain, snoring.  Cardio: Denies chest pain, palpitations, irregular heartbeat, syncope, dyspnea, diaphoresis, orthopnea, PND, claudication, edema Respiratory: denies cough, dyspnea, DOE, pleurisy, hoarseness, laryngitis, wheezing.  Gastrointestinal: Denies dysphagia, heartburn, reflux, water brash, pain, cramps, nausea,  vomiting, bloating, diarrhea, constipation, hematemesis, melena, hematochezia, jaundice, hemorrhoids Genitourinary: Denies dysuria, frequency, urgency, nocturia, hesitancy, discharge, hematuria, flank pain Breast: Breast lumps, nipple discharge, bleeding.  Musculoskeletal: Denies arthralgia, myalgia, stiffness, Jt. Swelling, pain, limp, and strain/sprain. Denies falls. Skin: Denies puritis, rash, hives, warts, acne, eczema, changing in skin lesion Neuro: No weakness, tremor, incoordination, spasms, paresthesia, pain Psychiatric: Denies confusion, memory loss, sensory loss. Denies Depression. Endocrine: Denies change in weight, skin, hair change, nocturia, and paresthesia, diabetic polys, visual blurring, hyper / hypo glycemic episodes.  Heme/Lymph: No excessive bleeding, bruising, enlarged lymph nodes.  Physical Exam  BP  136/84   Pulse 84   Temp (!) 96.7 F (35.9 C)   Resp 16   Ht 4\' 10"  (1.473 m)   Wt 118 lb 3.2 oz (53.6 kg)   SpO2 99%   BMI 24.70 kg/m   General Appearance: Well nourished, well groomed and in no apparent distress.  Eyes: PERRLA, EOMs, conjunctiva no swelling or erythema, normal fundi and vessels. Sinuses: No frontal/maxillary tenderness ENT/Mouth: EACs patent / TMs  nl. Nares clear without erythema, swelling, mucoid exudates. Oral hygiene is good. No erythema, swelling, or exudate. Tongue normal, non-obstructing. Tonsils not swollen or erythematous. Hearing normal.  Neck: Supple, thyroid not palpable. No bruits, nodes or JVD. Respiratory: Respiratory effort normal.  BS equal and clear bilateral without rales, rhonci, wheezing or stridor. Cardio: Heart sounds are normal with regular rate and rhythm and no murmurs, rubs or gallops. Peripheral pulses are normal and equal bilaterally without edema. No aortic or femoral bruits. Chest: symmetric with normal excursions and percussion. Breasts: Symmetric, without lumps, nipple discharge, retractions, or fibrocystic changes.    Abdomen: Flat, soft with bowel sounds active. Nontender, no guarding, rebound, hernias, masses, or organomegaly.  Lymphatics: Non tender without lymphadenopathy.  Genitourinary:  Musculoskeletal: Full ROM all peripheral extremities, joint stability, 5/5 strength, and normal gait. Skin: Warm and dry without rashes, lesions, cyanosis, clubbing or  ecchymosis.  Neuro: Cranial nerves intact, reflexes equal bilaterally. Normal muscle tone, no cerebellar symptoms. Sensation intact.  Pysch: Alert and oriented X 3, normal affect, Insight and Judgment appropriate.   Assessment and Plan  1. Annual Preventative Screening Examination   2. Essential hypertension  - EKG 12-Lead - [place], RETROPERITNL ABD,  LTD - Urinalysis, Routine w reflex microscopic - Microalbumin / creatinine urine ratio - CBC with Differential/Platelet - COMPLETE METABOLIC PANEL WITH GFR - Magnesium - TSH  3. Hyperlipidemia associated with type 2 diabetes mellitus ([place])  - EKG 12-Lead - [place], RETROPERITNL ABD,  LTD - Lipid panel - TSH  4. Type 2 diabetes mellitus with stage 1 chronic kidney  disease, without long-term current use of insulin ([facility])  - EKG 12-Lead - [place], RETROPERITNL ABD,  LTD - Urinalysis, Routine w reflex microscopic - Microalbumin / creatinine urine ratio - HM DIABETES FOOT EXAM - LOW EXTREMITY NEUR EXAM DOCUM - Hemoglobin A1c - Insulin, random  5. Vitamin D deficiency  - VITAMIN D 25 Hydroxy   6. Gastroesophageal reflux disease  - CBC with Differential/Platelet  7. Screening examination for pulmonary tuberculosis  - TB Skin Test  8. Screening for colorectal cancer  - POC Hemoccult Bld/Stl  9. Screening for ischemic heart disease  - EKG 12-Lead  10. FHx: heart disease  - EKG 12-Lead - [place], RETROPERITNL ABD,  LTD  11. Screening for AAA (aortic abdominal aneurysm)  - [place], RETROPERITNL ABD,  LTD  12. Fatigue, unspecified type  - Iron,Total/Total Iron Binding Cap - Vitamin  B12 - CBC with Differential/Platelet - TSH  13. Medication management  - Urinalysis, Routine w reflex microscopic - Microalbumin / creatinine urine ratio - CBC with Differential/Platelet - COMPLETE METABOLIC PANEL WITH GFR - Magnesium - Lipid panel - TSH - Hemoglobin A1c - VITAMIN D 25 Hydroxyl - Insulin, random             Patient was counseled in prudent diet to achieve/maintain BMI less than 25 for weight control, BP monitoring, regular exercise and medications. Discussed med's effects and SE's. Screening labs and tests as requested with regular follow-up as recommended. Over 40 minutes  of exam, counseling, chart review and high complex critical decision making was performed.   Kirtland Bouchard, MD

## 2020-01-08 ENCOUNTER — Other Ambulatory Visit: Payer: Self-pay

## 2020-01-08 ENCOUNTER — Ambulatory Visit: Payer: BC Managed Care – PPO | Admitting: Internal Medicine

## 2020-01-08 ENCOUNTER — Encounter: Payer: Self-pay | Admitting: Internal Medicine

## 2020-01-08 VITALS — BP 136/84 | HR 84 | Temp 96.7°F | Resp 16 | Ht <= 58 in | Wt 118.2 lb

## 2020-01-08 DIAGNOSIS — Z1211 Encounter for screening for malignant neoplasm of colon: Secondary | ICD-10-CM

## 2020-01-08 DIAGNOSIS — I10 Essential (primary) hypertension: Secondary | ICD-10-CM

## 2020-01-08 DIAGNOSIS — N181 Chronic kidney disease, stage 1: Secondary | ICD-10-CM

## 2020-01-08 DIAGNOSIS — Z130 Encounter for screening for diseases of the blood and blood-forming organs and certain disorders involving the immune mechanism: Secondary | ICD-10-CM

## 2020-01-08 DIAGNOSIS — Z1389 Encounter for screening for other disorder: Secondary | ICD-10-CM

## 2020-01-08 DIAGNOSIS — Z0001 Encounter for general adult medical examination with abnormal findings: Secondary | ICD-10-CM

## 2020-01-08 DIAGNOSIS — Z136 Encounter for screening for cardiovascular disorders: Secondary | ICD-10-CM

## 2020-01-08 DIAGNOSIS — Z1329 Encounter for screening for other suspected endocrine disorder: Secondary | ICD-10-CM | POA: Diagnosis not present

## 2020-01-08 DIAGNOSIS — R9431 Abnormal electrocardiogram [ECG] [EKG]: Secondary | ICD-10-CM

## 2020-01-08 DIAGNOSIS — E559 Vitamin D deficiency, unspecified: Secondary | ICD-10-CM | POA: Diagnosis not present

## 2020-01-08 DIAGNOSIS — Z8249 Family history of ischemic heart disease and other diseases of the circulatory system: Secondary | ICD-10-CM | POA: Diagnosis not present

## 2020-01-08 DIAGNOSIS — Z131 Encounter for screening for diabetes mellitus: Secondary | ICD-10-CM

## 2020-01-08 DIAGNOSIS — Z0000 Encounter for general adult medical examination without abnormal findings: Secondary | ICD-10-CM

## 2020-01-08 DIAGNOSIS — R5383 Other fatigue: Secondary | ICD-10-CM

## 2020-01-08 DIAGNOSIS — K219 Gastro-esophageal reflux disease without esophagitis: Secondary | ICD-10-CM

## 2020-01-08 DIAGNOSIS — E785 Hyperlipidemia, unspecified: Secondary | ICD-10-CM

## 2020-01-08 DIAGNOSIS — Z13220 Encounter for screening for lipoid disorders: Secondary | ICD-10-CM | POA: Diagnosis not present

## 2020-01-08 DIAGNOSIS — Z79899 Other long term (current) drug therapy: Secondary | ICD-10-CM

## 2020-01-08 DIAGNOSIS — Z111 Encounter for screening for respiratory tuberculosis: Secondary | ICD-10-CM | POA: Diagnosis not present

## 2020-01-08 DIAGNOSIS — E1169 Type 2 diabetes mellitus with other specified complication: Secondary | ICD-10-CM

## 2020-01-08 DIAGNOSIS — E1122 Type 2 diabetes mellitus with diabetic chronic kidney disease: Secondary | ICD-10-CM

## 2020-01-09 LAB — COMPLETE METABOLIC PANEL WITH GFR
AG Ratio: 1.4 (calc) (ref 1.0–2.5)
ALT: 17 U/L (ref 6–29)
AST: 17 U/L (ref 10–35)
Albumin: 4.1 g/dL (ref 3.6–5.1)
Alkaline phosphatase (APISO): 79 U/L (ref 37–153)
BUN/Creatinine Ratio: 19 (calc) (ref 6–22)
BUN: 9 mg/dL (ref 7–25)
CO2: 29 mmol/L (ref 20–32)
Calcium: 9.2 mg/dL (ref 8.6–10.4)
Chloride: 101 mmol/L (ref 98–110)
Creat: 0.47 mg/dL — ABNORMAL LOW (ref 0.50–0.99)
GFR, Est African American: 121 mL/min/{1.73_m2} (ref >=60)
GFR, Est Non African American: 104 mL/min/{1.73_m2} (ref >=60)
Globulin: 2.9 g/dL (calc) (ref 1.9–3.7)
Glucose, Bld: 158 mg/dL — ABNORMAL HIGH (ref 65–99)
Potassium: 4.6 mmol/L (ref 3.5–5.3)
Sodium: 138 mmol/L (ref 135–146)
Total Bilirubin: 0.2 mg/dL (ref 0.2–1.2)
Total Protein: 7 g/dL (ref 6.1–8.1)

## 2020-01-09 LAB — HEMOGLOBIN A1C
Hgb A1c MFr Bld: 7.4 % of total Hgb — ABNORMAL HIGH (ref <5.7)
Mean Plasma Glucose: 166 (calc)
eAG (mmol/L): 9.2 (calc)

## 2020-01-09 LAB — URINALYSIS, ROUTINE W REFLEX MICROSCOPIC
Bilirubin Urine: NEGATIVE
Glucose, UA: NEGATIVE
Hgb urine dipstick: NEGATIVE
Ketones, ur: NEGATIVE
Leukocytes,Ua: NEGATIVE
Nitrite: NEGATIVE
Protein, ur: NEGATIVE
Specific Gravity, Urine: 1.015 (ref 1.001–1.03)
pH: 7.5 (ref 5.0–8.0)

## 2020-01-09 LAB — IRON, TOTAL/TOTAL IRON BINDING CAP
%SAT: 17 % (calc) (ref 16–45)
Iron: 55 ug/dL (ref 45–160)
TIBC: 329 mcg/dL (calc) (ref 250–450)

## 2020-01-09 LAB — CBC WITH DIFFERENTIAL/PLATELET
Absolute Monocytes: 576 cells/uL (ref 200–950)
Basophils Absolute: 58 cells/uL (ref 0–200)
Basophils Relative: 0.8 %
Eosinophils Absolute: 266 cells/uL (ref 15–500)
Eosinophils Relative: 3.7 %
HCT: 40.4 % (ref 35.0–45.0)
Hemoglobin: 13.2 g/dL (ref 11.7–15.5)
Lymphs Abs: 1742 cells/uL (ref 850–3900)
MCH: 31.4 pg (ref 27.0–33.0)
MCHC: 32.7 g/dL (ref 32.0–36.0)
MCV: 96 fL (ref 80.0–100.0)
MPV: 11.6 fL (ref 7.5–12.5)
Monocytes Relative: 8 %
Neutro Abs: 4558 cells/uL (ref 1500–7800)
Neutrophils Relative %: 63.3 %
Platelets: 293 10*3/uL (ref 140–400)
RBC: 4.21 10*6/uL (ref 3.80–5.10)
RDW: 11.8 % (ref 11.0–15.0)
Total Lymphocyte: 24.2 %
WBC: 7.2 10*3/uL (ref 3.8–10.8)

## 2020-01-09 LAB — LIPID PANEL
Cholesterol: 118 mg/dL (ref <200)
HDL: 52 mg/dL (ref >=50)
LDL Cholesterol (Calc): 52 mg/dL (calc)
Non-HDL Cholesterol (Calc): 66 mg/dL (calc) (ref <130)
Total CHOL/HDL Ratio: 2.3 (calc) (ref <5.0)
Triglycerides: 56 mg/dL (ref <150)

## 2020-01-09 LAB — MICROALBUMIN / CREATININE URINE RATIO
Creatinine, Urine: 53 mg/dL (ref 20–275)
Microalb Creat Ratio: 6 mcg/mg creat (ref <30)
Microalb, Ur: 0.3 mg/dL

## 2020-01-09 LAB — VITAMIN B12: Vitamin B-12: 721 pg/mL (ref 200–1100)

## 2020-01-09 LAB — MAGNESIUM: Magnesium: 1.8 mg/dL (ref 1.5–2.5)

## 2020-01-09 LAB — INSULIN, RANDOM: Insulin: 15 u[IU]/mL

## 2020-01-09 LAB — VITAMIN D 25 HYDROXY (VIT D DEFICIENCY, FRACTURES): Vit D, 25-Hydroxy: 57 ng/mL (ref 30–100)

## 2020-01-09 LAB — TSH: TSH: 0.89 mIU/L (ref 0.40–4.50)

## 2020-01-10 ENCOUNTER — Other Ambulatory Visit: Payer: Self-pay | Admitting: Internal Medicine

## 2020-01-10 NOTE — Progress Notes (Signed)
========================================================== ==========================================================  -   Iron & Vitamin B12 levels - Both Normal  OK  ==========================================================  -   Magnesium  -   1.8  -  very  low- goal is betw 2.0 - 2.5,   - So..............[name]  Recommend that you take  Magnesium 500 mg tablet daily   - also important to eat lots of  leafy green vegetables   - spinach - Kale - collards - greens - okra - asparagus  - broccoli - quinoa -ex squash - almonds   - black, red, white beans  -  peas - green beans ==========================================================  -  Total Chol  = 118 and LDL = 52- Both  Excellent   - Very low risk for Heart Attack  / Stroke ========================================================  - A1c = 7.4%  - too high  (ideal or goal is less than 5.7%)   Being diabetic has a  300% increased risk for heart attack,  stroke, cancer, and alzheimer- type vascular dementia.   It is very important that you work harder with diet by  avoiding all foods that are white except chicken,   fish & calliflower.  - Avoid white rice  (brown & wild rice is OK),   - Avoid white potatoes  (sweet potatoes in moderation is OK),   White bread or wheat bread or anything made out of   white flour like bagels, donuts, rolls, buns, biscuits, cakes,  - pastries, cookies, pizza crust, and pasta (made from  white flour & egg whites)   - vegetarian pasta or spinach or wheat pasta is OK.  - Multigrain breads like Arnold's, Pepperidge Farm or   multigrain sandwich thins or high fiber breads like   [place] bread or "Dave's Killer" breads that are  4 to 5 grams fiber per slice !  are best.   ==========================================================  -  Vitamin D = 57 - OK  ==========================================================  -  All Else - CBC - Kidneys - Electrolytes - Liver - Magnesium & Thyroid     - all  Normal / OK ==========================================================

## 2020-01-17 ENCOUNTER — Other Ambulatory Visit: Payer: Self-pay | Admitting: Internal Medicine

## 2020-01-17 DIAGNOSIS — E119 Type 2 diabetes mellitus without complications: Secondary | ICD-10-CM

## 2020-02-18 NOTE — Progress Notes (Signed)
Cardiology Office Note:    Date:  02/22/2020   ID:  [name], DOB November 23, 1955, MRN [id number]  PCP:  [name], MD  Cardiologist:  No primary care provider on file.  Electrophysiologist:  None   Referring MD: [name], MD   Chief Complaint  Patient presents with  . Cardiac Valve Problem    History of Present Illness:    [name] is a 65 y.o. female with a hx of bicuspid aortic valve, T2DM, asthma, hypertension, hyperlipidemia who presents for follow-up.  She previously followed with Dr. Stanford Breed, last seen in 2017.  Echocardiogram at that time showed EF 60 to 65%, bicuspid aortic valve without AS or AI.  She was seen by Beckie Busing 01/2019, repeat echocardiogram was ordered.  Echo on 02/22/2019 showed normal biventricular function, grade 2 diastolic dysfunction, no aortic regurgitation or stenosis.  Since last clinic visit, she reports that she has been doing okay.  Main complaint is issues with pain in legs/restless leg syndrome.  Reports she has pain in her legs with walking in her calves and back of legs.  She does not exercise regularly but walks at her job.  She denies any chest pain or dyspnea.  Reports occasional lightheadedness but denies any syncope.  Reports occasional swelling in her feet.  Reports she has been having palpitations 1-2 times per week, where she feels like her heart is racing.  Can last for about 30 minutes.  BP is elevated today but she reports she did not take her medications.  Reports she forgets her blood pressure medicines 1-2 times per week.   Past Medical History:  Diagnosis Date  . Allergic rhinitis   . ASTHMA   . BICUSPID AORTIC VALVE   . CEREBROVASCULAR DISEASE   . DEGENERATIVE JOINT DISEASE   . Diabetes mellitus   . DJD (degenerative joint disease)   . GERD   . HSV-1 (herpes simplex virus 1) infection   . HYPERLIPIDEMIA   . Hypertension   . Palpitation   . Vitamin D deficiency     Past Surgical  History:  Procedure Laterality Date  . ABDOMINAL HYSTERECTOMY    . repair of trigger finger      Current Medications: Current Meds  Medication Sig  . albuterol (PROVENTIL HFA;VENTOLIN HFA) 108 (90 Base) MCG/ACT inhaler Use 1  To 2 inhalations 5 minutes apart every 4 hours if needed to rescue asthma  . aspirin EC 81 MG tablet Take 81 mg by mouth daily.  . B Complex-C (SUPER B COMPLEX PO) Take 1 tablet by mouth daily. Takes every other day  . BAYER MICROLET LANCETS lancets Check blood sugar 1 time daily-DX-E11.9.  [name] Cholecalciferol (VITAMIN D PO) Take 2 capsules by mouth daily. Take 4000 units by mouth twice a day  . Cinnamon 500 MG capsule Take 1,000 mg by mouth daily.   . cyclobenzaprine (FLEXERIL) 10 MG tablet Take 1/2 to 1 tablet at Bedtime for Muscle Spasm  . Diclofenac Sodium 1.5 % SOLN Place a small amount of gel on the bilateral hands for hand arthritis  . diphenhydrAMINE (BENADRYL) 25 MG tablet Take 25 mg by mouth. Takes PRN at bedtime.  . famotidine (PEPCID) 40 MG tablet TAKE 1 TABLET BY MOUTH DAILY FOR ACID , INDIGESTION AND HEARTBURN  . ferrous sulfate 325 (65 FE) MG tablet Take by mouth.  . fluconazole (DIFLUCAN) 150 MG tablet Use once for yeast infection as needed  . furosemide (LASIX) 20 MG tablet Take by mouth.  [name]  gabapentin (NEURONTIN) 100 MG capsule TAKE 1-3 TABLETS AT NIGHT  . glucose blood (BAYER CONTOUR NEXT TEST) test strip Check blood sugar 1 time daily-DX-E11.9.  . lisinopril-hydrochlorothiazide (ZESTORETIC) 10-12.5 MG tablet Take       1 tablet       Daily        for BP & Diabetic Kidney Protection  . MAGNESIUM PO Take 2 tablets by mouth daily.  . meloxicam (MOBIC) 15 MG tablet Take 1/2 to 1 tablet daily with food for pain &  Inflammation - limit to maximum of 5 days /week  . metFORMIN (GLUCOPHAGE-XR) 500 MG 24 hr tablet TAKE 2 TABLETS BY MOUTH TWICE DAILY WITH MEALS.  . metoprolol succinate (TOPROL-XL) 25 MG 24 hr tablet Take      1 tablet     Daily       for BP   . mometasone (NASONEX) 50 MCG/ACT nasal spray Place 2 sprays into the nose daily.  . Multiple Vitamin (MULITIVITAMIN WITH MINERALS) TABS Take 1 tablet by mouth daily.  [name] OVER THE COUNTER MEDICATION Takes Omega-Q vitamin which contains Tumeric.  . phentermine (ADIPEX-P) 37.5 MG tablet Take 1/2 to 1 tablet every morning for dieting & weightloss  . potassium chloride SA (KLOR-CON M20) 20 MEQ tablet TAKE 1 TABLET BY MOUTH 3 TIMES A DAY AFTER MEALS  . pravastatin (PRAVACHOL) 40 MG tablet Take by mouth.  . Psyllium (METAMUCIL PO) Take 1 Dose by mouth daily.  [name] rOPINIRole (REQUIP) 0.5 MG tablet TAKE 1 TABLET 3 TIMES A DAY FOR RESTLESS LEGS  . rosuvastatin (CRESTOR) 40 MG tablet TAKE ONE TABLET DAILY OR AS DIRECTED FOR CHOLESTEROL  . traMADol (ULTRAM) 50 MG tablet Take by mouth.  . traZODone (DESYREL) 150 MG tablet Take 1/2 to 1 tablet 1  to 2 hours before Bedtime  . triamcinolone cream (KENALOG) 0.1 % Apply 1 application topically 4 (four) times daily as needed.     Allergies:   Naprosyn [naproxen]   Social History   Socioeconomic History  . Marital status: Married    Spouse name: Not on file  . Number of children: Not on file  . Years of education: Not on file  . Highest education level: Not on file  Occupational History  . Not on file  Tobacco Use  . Smoking status: Never Smoker  . Smokeless tobacco: Never Used  Substance and Sexual Activity  . Alcohol use: Yes    Comment: very rarely  . Drug use: No  . Sexual activity: Not Currently  Other Topics Concern  . Not on file  Social History Narrative  . Not on file   Social Determinants of Health   Financial Resource Strain: Not on file  Food Insecurity: Not on file  Transportation Needs: Not on file  Physical Activity: Not on file  Stress: Not on file  Social Connections: Not on file     Family History: The patient's family history includes Breast cancer in her maternal aunt; Coronary artery disease in her mother; Diabetes  in her father; Heart disease in her mother; Hypertension in her father and mother.  ROS:   Please see the history of present illness.     All other systems reviewed and are negative.  EKGs/Labs/Other Studies Reviewed:    The following studies were reviewed today:   EKG:  EKG is not ordered today.    Recent Labs: 01/08/2020: ALT 17; BUN 9; Creat 0.47; Hemoglobin 13.2; Magnesium 1.8; Platelets 293; Potassium 4.6; Sodium 138;  TSH 0.89  Recent Lipid Panel    Component Value Date/Time   CHOL 118 01/08/2020 1135   TRIG 56 01/08/2020 1135   HDL 52 01/08/2020 1135   CHOLHDL 2.3 01/08/2020 1135   VLDL 20 09/03/2016 0929   LDLCALC 52 01/08/2020 1135    Physical Exam:    VS:  BP (!) 148/72   Pulse 82   Ht 4\' 10"  (1.473 m)   Wt 121 lb (54.9 kg)   BMI 25.29 kg/m     Wt Readings from Last 3 Encounters:  02/22/20 121 lb (54.9 kg)  01/08/20 118 lb 3.2 oz (53.6 kg)  11/06/19 119 lb (54 kg)     GEN:  Well nourished, well developed in no acute distress HEENT: Normal NECK: No JVD; No carotid bruits LYMPHATICS: No lymphadenopathy CARDIAC: RRR, no murmurs, rubs, gallops RESPIRATORY:  Clear to auscultation without rales, wheezing or rhonchi  ABDOMEN: Soft, non-tender, non-distended MUSCULOSKELETAL:  No edema; No deformity  SKIN: Warm and dry NEUROLOGIC:  Alert and oriented x 3 PSYCHIATRIC:  Normal affect   ASSESSMENT:    1. Bicuspid aortic valve   2. Palpitations   3. Bilateral leg pain   4. Pre-procedure lab exam   5. Hyperlipidemia, unspecified hyperlipidemia type   6. Essential hypertension    PLAN:    Bicuspid aortic valve: No aortic regurgitation or stenosis on echo 02/2019.  Will continue to monitor.  Recommend CTA chest to rule out aortic pathology  Palpitations: Description concerning for arrhythmia, will check Zio patch x14 days  Leg pain: Will check ABIs to rule out PAD  Hyperlipidemia: On rosuvastatin 40 mg daily.  LDL 52 on 01/08/2020  Hypertension: On  Toprol-XL 25 mg daily and lisinopril-HCTZ 10-12.5 mg daily.  BP elevated in clinic today but reports she did not take her medications.  Recommended compliance with her meds and to check BP daily for the next week and call with results  T2DM: On Metformin.  A1c 7.4% on 01/08/2020  RTC in 3 months  Medication Adjustments/Labs and Tests Ordered: Current medicines are reviewed at length with the patient today.  Concerns regarding medicines are outlined above.  Orders Placed This Encounter  Procedures  . CT ANGIO CHEST AORTA W/CM & OR WO/CM  . Basic metabolic panel  . LONG TERM MONITOR (3-14 DAYS)  . VAS [place] ABI WITH/WO TBI  . VAS [place] LOWER EXTREMITY ARTERIAL DUPLEX   No orders of the defined types were placed in this encounter.   Patient Instructions  Medication Instructions:  Your physician recommends that you continue on your current medications as directed. Please refer to the Current Medication list given to you today.  *If you need a refill on your cardiac medications before your next appointment, please call your pharmacy*   Lab Work: BMET prior to CT scan  If you have labs (blood work) drawn today and your tests are completely normal, you will receive your results only by: [name] MyChart Message (if you have MyChart) OR . A paper copy in the mail If you have any lab test that is abnormal or we need to change your treatment, we will call you to review the results.   Testing/Procedures: Your physician has requested that you have an ankle brachial index (ABI). During this test an ultrasound and blood pressure cuff are used to evaluate the arteries that supply the arms and legs with blood. Allow thirty minutes for this exam. There are no restrictions or special instructions.   CTA of  the chest/aorta   ZIO XT- Long Term Monitor Instructions   Your physician has requested you wear your ZIO patch monitor 14 days.   This is a single patch monitor.  [employer] supplies one patch monitor  per enrollment.  Additional stickers are not available.   Please do not apply patch if you will be having a Nuclear Stress Test, Echocardiogram, Cardiac CT, MRI, or Chest Xray during the time frame you would be wearing the monitor. The patch cannot be worn during these tests.  You cannot remove and re-apply the ZIO XT patch monitor.   Your ZIO patch monitor will be sent USPS Priority mail from [facility] directly to your home address. The monitor may also be mailed to a PO BOX if home delivery is not available.   It may take 3-5 days to receive your monitor after you have been enrolled.   Once you have received you monitor, please review enclosed instructions.  Your monitor has already been registered assigning a specific monitor serial # to you.   Applying the monitor   Shave hair from upper left chest.   Hold abrader disc by orange tab.  Rub abrader in 40 strokes over left upper chest as indicated in your monitor instructions.   Clean area with 4 enclosed alcohol pads .  Use all pads to assure are is cleaned thoroughly.  Let dry.   Apply patch as indicated in monitor instructions.  Patch will be place under collarbone on left side of chest with arrow pointing upward.   Rub patch adhesive wings for 2 minutes.Remove white label marked "1".  Remove white label marked "2".  Rub patch adhesive wings for 2 additional minutes.   While looking in a mirror, press and release button in center of patch.  A small green light will flash 3-4 times .  This will be your only indicator the monitor has been turned on.     Do not shower for the first 24 hours.  You may shower after the first 24 hours.   Press button if you feel a symptom. You will hear a small click.  Record Date, Time and Symptom in the Patient Log Book.   When you are ready to remove patch, follow instructions on last 2 pages of Patient Log Book.  Stick patch monitor onto last page of Patient Log Book.   Place Patient Log Book  in [place] box.  Use locking tab on box and tape box closed securely.  The [place] and [employer] has [employer] on it.  Please place in mailbox as soon as possible.  Your physician should have your test results approximately 7 days after the monitor has been mailed back to [facility].   Call [facility] Customer Care at [phone] if you have questions regarding your ZIO XT patch monitor.  Call them immediately if you see an orange light blinking on your monitor.   If your monitor falls off in less than 4 days contact our Monitor department at [phone].  If your monitor becomes loose or falls off after 4 days call [employer] at [phone] for suggestions on securing your monitor.   Follow-Up: At [facility], you and your health needs are our priority.  As part of our continuing mission to provide you with exceptional heart care, we have created designated Provider Care Teams.  These Care Teams include your primary Cardiologist (physician) and Advanced Practice Providers (APPs -  Physician Assistants and Nurse Practitioners) who all work together  to provide you with the care you need, when you need it.  We recommend signing up for the patient portal called "MyChart".  Sign up information is provided on this After Visit Summary.  MyChart is used to connect with patients for Virtual Visits (Telemedicine).  Patients are able to view lab/test results, encounter notes, upcoming appointments, etc.  Non-urgent messages can be sent to your provider as well.   To learn more about what you can do with MyChart, go to [email or web address].    Your next appointment:   3 month(s)  The format for your next appointment:   In Person  Provider:   Oswaldo Milian, MD   Other Instructions Please check your blood pressure at home daily, write it down.  Call the office or send message via Mychart with the readings in 2 weeks for Dr. Gardiner Rhyme to review.       Signed, Donato Heinz, MD  02/22/2020 10:49 AM    [place]

## 2020-02-21 ENCOUNTER — Encounter: Payer: BC Managed Care – PPO | Admitting: Internal Medicine

## 2020-02-22 ENCOUNTER — Ambulatory Visit (INDEPENDENT_AMBULATORY_CARE_PROVIDER_SITE_OTHER): Payer: BC Managed Care – PPO | Admitting: Cardiology

## 2020-02-22 ENCOUNTER — Encounter: Payer: Self-pay | Admitting: Cardiology

## 2020-02-22 ENCOUNTER — Encounter: Payer: Self-pay | Admitting: Radiology

## 2020-02-22 ENCOUNTER — Ambulatory Visit (INDEPENDENT_AMBULATORY_CARE_PROVIDER_SITE_OTHER): Payer: BC Managed Care – PPO

## 2020-02-22 ENCOUNTER — Other Ambulatory Visit: Payer: Self-pay

## 2020-02-22 VITALS — BP 148/72 | HR 82 | Ht <= 58 in | Wt 121.0 lb

## 2020-02-22 DIAGNOSIS — R002 Palpitations: Secondary | ICD-10-CM | POA: Diagnosis not present

## 2020-02-22 DIAGNOSIS — Z01812 Encounter for preprocedural laboratory examination: Secondary | ICD-10-CM

## 2020-02-22 DIAGNOSIS — E785 Hyperlipidemia, unspecified: Secondary | ICD-10-CM

## 2020-02-22 DIAGNOSIS — M79604 Pain in right leg: Secondary | ICD-10-CM | POA: Diagnosis not present

## 2020-02-22 DIAGNOSIS — Q231 Congenital insufficiency of aortic valve: Secondary | ICD-10-CM

## 2020-02-22 DIAGNOSIS — M79605 Pain in left leg: Secondary | ICD-10-CM

## 2020-02-22 DIAGNOSIS — I10 Essential (primary) hypertension: Secondary | ICD-10-CM

## 2020-02-22 LAB — BASIC METABOLIC PANEL
BUN/Creatinine Ratio: 24 (ref 12–28)
BUN: 13 mg/dL (ref 8–27)
CO2: 24 mmol/L (ref 20–29)
Calcium: 9.2 mg/dL (ref 8.7–10.3)
Chloride: 100 mmol/L (ref 96–106)
Creatinine, Ser: 0.55 mg/dL — ABNORMAL LOW (ref 0.57–1.00)
GFR calc Af Amer: 115 mL/min/{1.73_m2} (ref >59)
GFR calc non Af Amer: 99 mL/min/{1.73_m2} (ref >59)
Glucose: 102 mg/dL — ABNORMAL HIGH (ref 65–99)
Potassium: 4.5 mmol/L (ref 3.5–5.2)
Sodium: 139 mmol/L (ref 134–144)

## 2020-02-22 NOTE — Patient Instructions (Signed)
Medication Instructions:  Your physician recommends that you continue on your current medications as directed. Please refer to the Current Medication list given to you today.  *If you need a refill on your cardiac medications before your next appointment, please call your pharmacy*   Lab Work: BMET prior to CT scan  If you have labs (blood work) drawn today and your tests are completely normal, you will receive your results only by: [name] MyChart Message (if you have MyChart) OR . A paper copy in the mail If you have any lab test that is abnormal or we need to change your treatment, we will call you to review the results.   Testing/Procedures: Your physician has requested that you have an ankle brachial index (ABI). During this test an ultrasound and blood pressure cuff are used to evaluate the arteries that supply the arms and legs with blood. Allow thirty minutes for this exam. There are no restrictions or special instructions.   CTA of the chest/aorta   ZIO XT- Long Term Monitor Instructions   Your physician has requested you wear your ZIO patch monitor 14 days.   This is a single patch monitor.  Irhythm supplies one patch monitor per enrollment.  Additional stickers are not available.   Please do not apply patch if you will be having a Nuclear Stress Test, Echocardiogram, Cardiac CT, MRI, or Chest Xray during the time frame you would be wearing the monitor. The patch cannot be worn during these tests.  You cannot remove and re-apply the ZIO XT patch monitor.   Your ZIO patch monitor will be sent USPS Priority mail from [facility] directly to your home address. The monitor may also be mailed to a PO BOX if home delivery is not available.   It may take 3-5 days to receive your monitor after you have been enrolled.   Once you have received you monitor, please review enclosed instructions.  Your monitor has already been registered assigning a specific monitor serial # to you.    Applying the monitor   Shave hair from upper left chest.   Hold abrader disc by orange tab.  Rub abrader in 40 strokes over left upper chest as indicated in your monitor instructions.   Clean area with 4 enclosed alcohol pads .  Use all pads to assure are is cleaned thoroughly.  Let dry.   Apply patch as indicated in monitor instructions.  Patch will be place under collarbone on left side of chest with arrow pointing upward.   Rub patch adhesive wings for 2 minutes.Remove white label marked "1".  Remove white label marked "2".  Rub patch adhesive wings for 2 additional minutes.   While looking in a mirror, press and release button in center of patch.  A small green light will flash 3-4 times .  This will be your only indicator the monitor has been turned on.     Do not shower for the first 24 hours.  You may shower after the first 24 hours.   Press button if you feel a symptom. You will hear a small click.  Record Date, Time and Symptom in the Patient Log Book.   When you are ready to remove patch, follow instructions on last 2 pages of Patient Log Book.  Stick patch monitor onto last page of Patient Log Book.   Place Patient Log Book in [place] box.  Use locking tab on box and tape box closed securely.  The [place] and [employer]  box has prepaid postage on it.  Please place in mailbox as soon as possible.  Your physician should have your test results approximately 7 days after the monitor has been mailed back to [facility].   Call [place] at [phone] if you have questions regarding your ZIO XT patch monitor.  Call them immediately if you see an orange light blinking on your monitor.   If your monitor falls off in less than 4 days contact our Monitor department at [phone].  If your monitor becomes loose or falls off after 4 days call [employer] at [phone] for suggestions on securing your monitor.   Follow-Up: At [facility], you and your health needs are  our priority.  As part of our continuing mission to provide you with exceptional heart care, we have created designated Provider Care Teams.  These Care Teams include your primary Cardiologist (physician) and Advanced Practice Providers (APPs -  Physician Assistants and Nurse Practitioners) who all work together to provide you with the care you need, when you need it.  We recommend signing up for the patient portal called "MyChart".  Sign up information is provided on this After Visit Summary.  MyChart is used to connect with patients for Virtual Visits (Telemedicine).  Patients are able to view lab/test results, encounter notes, upcoming appointments, etc.  Non-urgent messages can be sent to your provider as well.   To learn more about what you can do with MyChart, go to [email or web address].    Your next appointment:   3 month(s)  The format for your next appointment:   In Person  Provider:   Oswaldo Milian, MD   Other Instructions Please check your blood pressure at home daily, write it down.  Call the office or send message via Mychart with the readings in 2 weeks for Dr. Gardiner Rhyme to review.

## 2020-02-22 NOTE — Progress Notes (Signed)
Enrolled patient for a 14 day Zio XT Monitor to be mailed to patients home  

## 2020-02-25 ENCOUNTER — Other Ambulatory Visit: Payer: Self-pay | Admitting: Cardiology

## 2020-02-25 DIAGNOSIS — M79604 Pain in right leg: Secondary | ICD-10-CM

## 2020-02-25 DIAGNOSIS — M79605 Pain in left leg: Secondary | ICD-10-CM

## 2020-02-25 DIAGNOSIS — I739 Peripheral vascular disease, unspecified: Secondary | ICD-10-CM

## 2020-03-04 ENCOUNTER — Inpatient Hospital Stay: Admission: RE | Admit: 2020-03-04 | Payer: BC Managed Care – PPO | Source: Ambulatory Visit

## 2020-03-05 ENCOUNTER — Other Ambulatory Visit: Payer: Self-pay

## 2020-03-05 ENCOUNTER — Ambulatory Visit (HOSPITAL_COMMUNITY)
Admission: RE | Admit: 2020-03-05 | Discharge: 2020-03-05 | Disposition: A | Discharge status: Home / Self Care | Payer: BC Managed Care – PPO | Source: Ambulatory Visit | Attending: Internal Medicine | Admitting: Internal Medicine

## 2020-03-05 DIAGNOSIS — I739 Peripheral vascular disease, unspecified: Secondary | ICD-10-CM

## 2020-03-05 DIAGNOSIS — M79605 Pain in left leg: Secondary | ICD-10-CM

## 2020-03-05 DIAGNOSIS — M79604 Pain in right leg: Secondary | ICD-10-CM

## 2020-03-06 ENCOUNTER — Other Ambulatory Visit: Payer: Self-pay | Admitting: Internal Medicine

## 2020-03-06 ENCOUNTER — Ambulatory Visit (INDEPENDENT_AMBULATORY_CARE_PROVIDER_SITE_OTHER): Payer: BC Managed Care – PPO | Admitting: Internal Medicine

## 2020-03-06 VITALS — BP 126/66 | HR 93 | Temp 97.0°F | Resp 16 | Ht <= 58 in | Wt 119.4 lb

## 2020-03-06 DIAGNOSIS — D225 Melanocytic nevi of trunk: Secondary | ICD-10-CM | POA: Diagnosis not present

## 2020-03-06 DIAGNOSIS — L820 Inflamed seborrheic keratosis: Secondary | ICD-10-CM

## 2020-03-06 DIAGNOSIS — I10 Essential (primary) hypertension: Secondary | ICD-10-CM | POA: Diagnosis not present

## 2020-03-06 DIAGNOSIS — D485 Neoplasm of uncertain behavior of skin: Secondary | ICD-10-CM | POA: Diagnosis not present

## 2020-03-06 NOTE — Progress Notes (Signed)
History of Present Illness:  This very nice 65 yo MBF with hx/o  HTN, HLD, T2_NIDDM, GERD and Vitamin D Deficiency who presents for follow-up and has concern re: several skin lesions.   Medications  Current Outpatient Medications (Endocrine & Metabolic):  .  metFORMIN (GLUCOPHAGE-XR) 500 MG 24 hr tablet, TAKE 2 TABLETS BY MOUTH TWICE DAILY WITH MEALS.  Current Outpatient Medications (Cardiovascular):  .  furosemide (LASIX) 20 MG tablet, Take by mouth. [name]  lisinopril-hydrochlorothiazide (ZESTORETIC) 10-12.5 MG tablet, Take       1 tablet       Daily        for BP & Diabetic Kidney Protection .  metoprolol succinate (TOPROL-XL) 25 MG 24 hr tablet, Take      1 tablet     Daily       for BP .  pravastatin (PRAVACHOL) 40 MG tablet, Take by mouth. .  rosuvastatin (CRESTOR) 40 MG tablet, TAKE ONE TABLET DAILY OR AS DIRECTED FOR CHOLESTEROL  Current Outpatient Medications (Respiratory):  .  albuterol (PROVENTIL HFA;VENTOLIN HFA) 108 (90 Base) MCG/ACT inhaler, Use 1  To 2 inhalations 5 minutes apart every 4 hours if needed to rescue asthma .  diphenhydrAMINE (BENADRYL) 25 MG tablet, Take 25 mg by mouth. Takes PRN at bedtime. .  mometasone (NASONEX) 50 MCG/ACT nasal spray, Place 2 sprays into the nose daily.  Current Outpatient Medications (Analgesics):  .  aspirin EC 81 MG tablet, Take 81 mg by mouth daily. .  meloxicam (MOBIC) 15 MG tablet, Take 1/2 to 1 tablet daily with food for pain &  Inflammation - limit to maximum of 5 days /week .  traMADol (ULTRAM) 50 MG tablet, Take by mouth.  Current Outpatient Medications (Hematological):  .  ferrous sulfate 325 (65 FE) MG tablet, Take by mouth.  Current Outpatient Medications (Other):  [name]  B Complex-C (SUPER B COMPLEX PO), Take 1 tablet by mouth daily. Takes every other day .  BAYER MICROLET LANCETS lancets, Check blood sugar 1 time daily-DX-E11.9. [name]  Cholecalciferol (VITAMIN D PO), Take 2 capsules by mouth daily. Take 4000 units by mouth twice  a day .  Cinnamon 500 MG capsule, Take 1,000 mg by mouth daily.  .  cyclobenzaprine (FLEXERIL) 10 MG tablet, Take 1/2 to 1 tablet at Bedtime for Muscle Spasm .  Diclofenac Sodium 1.5 % SOLN, Place a small amount of gel on the bilateral hands for hand arthritis .  famotidine (PEPCID) 40 MG tablet, TAKE 1 TABLET BY MOUTH DAILY FOR ACID , INDIGESTION AND HEARTBURN .  fluconazole (DIFLUCAN) 150 MG tablet, Use once for yeast infection as needed .  gabapentin (NEURONTIN) 100 MG capsule, TAKE 1-3 TABLETS AT NIGHT .  glucose blood (BAYER CONTOUR NEXT TEST) test strip, Check blood sugar 1 time daily-DX-E11.9. .  MAGNESIUM PO, Take 2 tablets by mouth daily. .  Multiple Vitamin (MULITIVITAMIN WITH MINERALS) TABS, Take 1 tablet by mouth daily. [name]  OVER THE COUNTER MEDICATION, Takes Omega-Q vitamin which contains Tumeric. .  phentermine (ADIPEX-P) 37.5 MG tablet, Take 1/2 to 1 tablet every morning for dieting & weightloss .  potassium chloride SA (KLOR-CON M20) 20 MEQ tablet, TAKE 1 TABLET BY MOUTH 3 TIMES A DAY AFTER MEALS .  Psyllium (METAMUCIL PO), Take 1 Dose by mouth daily. [name]  rOPINIRole (REQUIP) 0.5 MG tablet, TAKE 1 TABLET 3 TIMES A DAY FOR RESTLESS LEGS .  traZODone (DESYREL) 150 MG tablet, Take 1/2 to 1 tablet 1  to 2 hours  before Bedtime .  triamcinolone cream (KENALOG) 0.1 %, Apply 1 application topically 4 (four) times daily as needed.  Problem list She has Cerebrovascular disease; Asthma; GERD; Osteoarthritis; BICUSPID AORTIC VALVE; Hyperlipidemia associated with type 2 diabetes mellitus ([place]); Vitamin D deficiency; Essential hypertension; Left carotid bruit; Medication management; Sleep disorder, shift-work; Palpitations; Abnormal electrocardiogram; Type 2 diabetes with stage 1 chronic kidney disease GFR>90 ([place]); Esotropia of right eye; and Degeneration of lumbar or lumbosacral intervertebral disc on their problem list.   Observations/Objective:  BP 126/66   Pulse 93   Temp (!) 97 F (36.1  C)   Resp 16   Ht 4\' 10"  (1.473 m)   Wt 119 lb 6.4 oz (54.2 kg)   SpO2 97%   BMI 24.95 kg/m   (1 & 2) - There were 2 irritated raised inflamed  crusty seborrheic Keratoses: A 5 mm lesion on the Rt mid anterior thigh and a 9 mm similar lesion on the Left mid anterior thigh  Procedure   (CPT - 17000 & 17003)     Both lesions were anesthetized with 0.5 ml of Marcaine 0.5% and then sharply excised with a #10 scalpel by shave technique. Wound base were hyfrecated for electrodestruction of residual lesion & hemostasis.   (3) There is a 5 x 5 mm smooth dark brown raised lesion of the Left mid back    Procedure   (CPT - 11400)     After informed consent and aseptic prep with alcohol, the lesion of the Left mid back was locally anesthetized with 1 ml  Marcaine 0.5%. Then the lesion was sharply excised with a #10 scalpel .  Lesion submitted for Path analysis. The wound was closed with # 3 vertical mattress sutures of Nylon 3-0.      Sterile dressings were applied & patient was instructed in wound care   Assessment and Plan:  1. Essential hypertension   2. Neoplasm of uncertain behavior of skin of back  - Surgical pathology  3. Inflamed seborrheic keratosis x 2 ( anterior bilat thighs)   Follow Up Instructions:      I discussed the assessment and treatment plan with the patient. The patient was provided an opportunity to ask questions and all were answered. The patient agreed with the plan and demonstrated an understanding of the instructions.      The patient was advised to call back or seek an in-person evaluation if the symptoms worsen or if the condition fails to improve as anticipated.    Kirtland Bouchard, MD

## 2020-03-07 ENCOUNTER — Ambulatory Visit (INDEPENDENT_AMBULATORY_CARE_PROVIDER_SITE_OTHER)
Admission: RE | Admit: 2020-03-07 | Discharge: 2020-03-07 | Disposition: A | Discharge status: Home / Self Care | Payer: BC Managed Care – PPO | Source: Ambulatory Visit | Attending: Cardiology | Admitting: Cardiology

## 2020-03-07 ENCOUNTER — Other Ambulatory Visit: Payer: Self-pay

## 2020-03-07 DIAGNOSIS — Q231 Congenital insufficiency of aortic valve: Secondary | ICD-10-CM | POA: Diagnosis not present

## 2020-03-07 DIAGNOSIS — M2578 Osteophyte, vertebrae: Secondary | ICD-10-CM | POA: Diagnosis not present

## 2020-03-07 DIAGNOSIS — M19011 Primary osteoarthritis, right shoulder: Secondary | ICD-10-CM | POA: Diagnosis not present

## 2020-03-07 DIAGNOSIS — J439 Emphysema, unspecified: Secondary | ICD-10-CM | POA: Diagnosis not present

## 2020-03-07 MED ORDER — IOHEXOL 350 MG/ML SOLN
100.0000 mL | Freq: Once | INTRAVENOUS | Status: AC | PRN
  Administered 2020-03-07: 100 mL via INTRAVENOUS

## 2020-03-11 NOTE — Progress Notes (Signed)
========================================================== ==========================================================  -    Pathology of biopsy returned showing a                                                    skin mole" or Nevus which is Benign & OK ========================================================== ==========================================================

## 2020-03-20 NOTE — Progress Notes (Signed)
        Patient is a very nice 65 yo MBF returns 2 week post excision of a skin lesion of the left mid back.      BP 128/88   Pulse 89   Temp 97.7 F (36.5 C)   Ht 4\' 10"  (1.473 m)   Wt 113 lb (51.3 kg)   SpO2 97%   BMI 23.62 kg/m    Wound appears well healed with good healing ridge and no sign of infection.    Path showed a benign nevus.   Sutures are removed.

## 2020-03-21 ENCOUNTER — Ambulatory Visit: Payer: BC Managed Care – PPO | Admitting: Internal Medicine

## 2020-03-21 ENCOUNTER — Encounter: Payer: Self-pay | Admitting: Internal Medicine

## 2020-03-21 ENCOUNTER — Other Ambulatory Visit: Payer: Self-pay

## 2020-03-21 VITALS — BP 128/88 | HR 89 | Temp 97.7°F | Ht <= 58 in | Wt 113.0 lb

## 2020-03-21 DIAGNOSIS — D229 Melanocytic nevi, unspecified: Secondary | ICD-10-CM

## 2020-03-26 DIAGNOSIS — R002 Palpitations: Secondary | ICD-10-CM | POA: Diagnosis not present

## 2020-04-19 ENCOUNTER — Other Ambulatory Visit: Payer: Self-pay | Admitting: Adult Health

## 2020-04-19 ENCOUNTER — Other Ambulatory Visit: Payer: Self-pay | Admitting: Internal Medicine

## 2020-04-21 DIAGNOSIS — Z6823 Body mass index (BMI) 23.0-23.9, adult: Secondary | ICD-10-CM | POA: Insufficient documentation

## 2020-04-21 NOTE — Progress Notes (Signed)
FOLLOW UP  Assessment and Plan:   Hypertension Well controlled with current medications  Monitor blood pressure at home; patient to call if consistently greater than 130/80 Continue DASH diet.   Reminder to go to the ER if any CP, SOB, nausea, dizziness, severe HA, changes vision/speech, left arm numbness and tingling and jaw pain.  Cholesterol Currently at goal of LDL <70; continue crestor 20 mg daily Continue low cholesterol diet and exercise.  Check lipid panel.   Diabetes with diabetic chronic kidney disease Continue medication: metformin Continue diet and exercise.  Perform daily foot/skin check, notify office of any concerning changes.  Check A1C Not checking sugars at home - does have equipment, encouraged to check at least 1-2 x per week   CKD 1 associated with T2DM  Increase fluids, avoid NSAIDS, monitor sugars, will monitor  Vitamin D Def At goal at last visit; continue supplementation to maintain goal of 60-100  Vit D level  Insomnia/somnolensce due to shift work/RLS Continue trazodone, gabapentin as needed Continue requip for RLS unless improved with higher dose gabapentin  Continue diet and meds as discussed. Further disposition pending results of labs. Discussed med's effects and SE's.   Over 30 minutes of exam, counseling, chart review, and critical decision making was performed.   Future Appointments  Date Time Provider [place]  07/29/2020  9:30 AM [name], MD [facility] None  01/22/2021 11:00 AM [name], MD [facility] None    ----------------------------------------------------------------------------------------------------------------------  HPI 65 y.o. female  presents for 3 month follow up on hypertension, cholesterol, diabetes with CKD, weight and vitamin D deficiency.   She has intermittent lumbar pain and L extremity pain (posterior thigh, calf, aching). Following with Dr. Rip Harbour at [facility]. Had lumbar xrays  and MRI that showed lumbar degenerative disc disease. Was recommended PT but never started due to cost concerns. She is taking meloxicam every other day to manage.    she is prescribed phentermine for weight loss and for somnolence due to shift work, she reports taking 1/2 tab prior to work 5 days week, doesn't take on weekends.   She works from [employer]- 3-5:30 AM, taking the trazodone 150 1/2 at night that is helping. melatonin, taking gabapentin 200 mg at HS and lower back pain and some aching in legs if sits for extended periods (follows with ortho). She does have a history of RLS, taking requip 10 mg TID which is helpful, She is on an iron supplement 65mg  daily.   BMI is Body mass index is 24.24 kg/m., she is working on diet and exercise. Wt Readings from Last 3 Encounters:  04/22/20 116 lb (52.6 kg)  03/21/20 113 lb (51.3 kg)  03/06/20 119 lb 6.4 oz (54.2 kg)   She is followed by Dr. Stanford Breed for cerebrovascular disease with most recent dopplers in 2017 showing mild disease only, with recommendation to continue ASA and statin.  Known asymptomatic bicuspid aortic valve; Echo on 02/22/2019 showed normal biventricular function, grade 2 diastolic dysfunction, no aortic regurgitation or stenosis.  She has not been checking her BP at home, today their BP is BP: 132/74  She does not workout but works on her feet for long shifts. She denies chest pain, shortness of breath, dizziness.   She is on cholesterol medication on pravastatin (taking 1/2 tab 20 mg daily) and denies myalgias. Her cholesterol is at goal. The cholesterol last visit was:   Lab Results  Component Value Date   CHOL 118 01/08/2020   HDL 52 01/08/2020  [place] 52 01/08/2020   TRIG 56 01/08/2020   CHOLHDL 2.3 01/08/2020    She has not been working on diet and exercise for T2 diabetes With hyperlipidemia on pravastatin  CAD on ASA Stage 1 CKD She is on ACE On metformin - takes 2000 mg daily  Cinnamon Could not tolerate  invokana denies increased appetite, nausea, paresthesia of the feet, polydipsia, polyuria, visual disturbances and vomiting.  She reports she has not been checking her sugars despite being advised to do so. She denies any symptoms of hypoglycemia. She does have glucometer. Last A1C in the office was:  Lab Results  Component Value Date   HGBA1C 7.4 (H) 01/08/2020   She has GFR in CKD 1 stage, associated with T2DM monitored at this office:  Lab Results  Component Value Date   GFRAA 115 02/22/2020   Patient is on Vitamin D supplement and at goal at the last check:    Lab Results  Component Value Date   VD25OH 57 01/08/2020        Current Medications:  Current Outpatient Medications on File Prior to Visit  Medication Sig  . albuterol (PROVENTIL HFA;VENTOLIN HFA) 108 (90 Base) MCG/ACT inhaler Use 1  To 2 inhalations 5 minutes apart every 4 hours if needed to rescue asthma  . aspirin EC 81 MG tablet Take 81 mg by mouth daily.  . B Complex-C (SUPER B COMPLEX PO) Take 1 tablet by mouth daily. Takes every other day  . Cholecalciferol (VITAMIN D PO) Take 2 capsules by mouth daily. Take 4000 units by mouth twice a day  . Cinnamon 500 MG capsule Take 1,000 mg by mouth daily.   . cyclobenzaprine (FLEXERIL) 10 MG tablet Take 1/2 to 1 tablet at Bedtime for Muscle Spasm  . Diclofenac Sodium 1.5 % SOLN Place a small amount of gel on the bilateral hands for hand arthritis  . diphenhydrAMINE (BENADRYL) 25 MG tablet Take 25 mg by mouth. Takes PRN at bedtime.  . famotidine (PEPCID) 40 MG tablet TAKE 1 TABLET BY MOUTH DAILY FOR ACID , INDIGESTION AND HEARTBURN  . ferrous sulfate 325 (65 FE) MG tablet Take by mouth.  . fluconazole (DIFLUCAN) 150 MG tablet Use once for yeast infection as needed  . furosemide (LASIX) 20 MG tablet Take by mouth.  . gabapentin (NEURONTIN) 100 MG capsule TAKE 1 TO 3 CAPSULES AT NIGHT  . lisinopril-hydrochlorothiazide (ZESTORETIC) 10-12.5 MG tablet Take       1 tablet        Daily        for BP & Diabetic Kidney Protection  . MAGNESIUM PO Take 2 tablets by mouth daily.  . meloxicam (MOBIC) 15 MG tablet Take 1/2 to 1 tablet daily with food for pain &  Inflammation - limit to maximum of 5 days /week  . metFORMIN (GLUCOPHAGE-XR) 500 MG 24 hr tablet TAKE 2 TABLETS BY MOUTH TWICE DAILY WITH MEALS.  . metoprolol succinate (TOPROL-XL) 25 MG 24 hr tablet Take      1 tablet     Daily       for BP  . mometasone (NASONEX) 50 MCG/ACT nasal spray Place 2 sprays into the nose daily.  . Multiple Vitamin (MULITIVITAMIN WITH MINERALS) TABS Take 1 tablet by mouth daily.  [name] OVER THE COUNTER MEDICATION Takes Omega-Q vitamin which contains Tumeric.  . phentermine (ADIPEX-P) 37.5 MG tablet Take 1/2 to 1 tablet every morning for dieting & weightloss  . potassium chloride SA (KLOR-CON M20) 20 MEQ  tablet TAKE 1 TABLET BY MOUTH 3 TIMES A DAY AFTER MEALS  . pravastatin (PRAVACHOL) 40 MG tablet Take by mouth.  . Psyllium (METAMUCIL PO) Take 1 Dose by mouth daily.  [name] rOPINIRole (REQUIP) 0.5 MG tablet TAKE 1 TABLET 3 TIMES A DAY FOR RESTLESS LEGS  . rosuvastatin (CRESTOR) 40 MG tablet TAKE ONE TABLET DAILY OR AS DIRECTED FOR CHOLESTEROL  . triamcinolone cream (KENALOG) 0.1 % Apply 1 application topically 4 (four) times daily as needed.  [name] BAYER MICROLET LANCETS lancets Check blood sugar 1 time daily-DX-E11.9.  . glucose blood (BAYER CONTOUR NEXT TEST) test strip Check blood sugar 1 time daily-DX-E11.9.  . traMADol (ULTRAM) 50 MG tablet Take by mouth. (Patient not taking: Reported on 04/22/2020)   No current facility-administered medications on file prior to visit.     Allergies:  Allergies  Allergen Reactions  . Naprosyn [Naproxen] Other (See Comments)    GI upset     Medical History:  Past Medical History:  Diagnosis Date  . Allergic rhinitis   . ASTHMA   . BICUSPID AORTIC VALVE   . CEREBROVASCULAR DISEASE   . DEGENERATIVE JOINT DISEASE   . Diabetes mellitus   . DJD  (degenerative joint disease)   . GERD   . HSV-1 (herpes simplex virus 1) infection   . HYPERLIPIDEMIA   . Hypertension   . Palpitation   . Vitamin D deficiency    Family history- Reviewed and unchanged Social history- Reviewed and unchanged   Review of Systems: Review of Systems  Constitutional: Positive for malaise/fatigue (daytime fatigue/somnolensce ). Negative for weight loss.  HENT: Negative for hearing loss and tinnitus.   Eyes: Negative for blurred vision and double vision.  Respiratory: Negative for cough, shortness of breath and wheezing.   Cardiovascular: Negative for chest pain, palpitations, orthopnea, claudication and leg swelling.  Gastrointestinal: Negative for abdominal pain, blood in stool, constipation, diarrhea, heartburn, melena, nausea and vomiting.  Genitourinary: Negative.   Musculoskeletal: Positive for back pain (intermittent lumbar with radicular sx, follows sports med). Negative for falls, joint pain, myalgias and neck pain.  Skin: Negative for rash.  Neurological: Negative for dizziness, tingling, sensory change, weakness and headaches.  Endo/Heme/Allergies: Negative for polydipsia.  Psychiatric/Behavioral: Negative for depression and substance abuse. The patient has insomnia. The patient is not nervous/anxious.   All other systems reviewed and are negative.   Physical Exam: BP 132/74   Pulse 88   Temp (!) 97.3 F (36.3 C)   Wt 116 lb (52.6 kg)   SpO2 99%   BMI 24.24 kg/m  Wt Readings from Last 3 Encounters:  04/22/20 116 lb (52.6 kg)  03/21/20 113 lb (51.3 kg)  03/06/20 119 lb 6.4 oz (54.2 kg)   General Appearance: Well nourished, in no apparent distress. Eyes: PERRLA, Right eye gaze permanently deviated, conjunctiva no swelling or erythema Sinuses: No Frontal/maxillary tenderness ENT/Mouth: Ext aud canals clear, TMs without erythema, bulging. No erythema, swelling, or exudate on post pharynx.  Tonsils not swollen or erythematous. Hearing  normal.  Neck: Supple, thyroid normal.  Respiratory: Respiratory effort normal, BS equal bilaterally without rales, rhonchi, wheezing or stridor.  Cardio: RRR with no MRGs. Brisk peripheral pulses without edema.  Abdomen: Soft, + BS.  Non tender, no guarding, rebound, hernias, masses. Lymphatics: Non tender without lymphadenopathy.  Musculoskeletal: Full ROM, no laxity or crepitus to bilateral knees, 5/5 strength, Normal gait, neg straight leg raise.  Skin: Warm, dry without rashes, lesions, ecchymosis.  Neuro: Cranial nerves intact. No  cerebellar symptoms.  Psych: Awake and oriented X 3, normal affect, Insight and Judgment appropriate.    Izora Ribas, NP 8:51 AM [facility]

## 2020-04-22 ENCOUNTER — Ambulatory Visit: Payer: BC Managed Care – PPO | Admitting: Adult Health

## 2020-04-22 ENCOUNTER — Encounter: Payer: Self-pay | Admitting: Adult Health

## 2020-04-22 ENCOUNTER — Other Ambulatory Visit: Payer: Self-pay

## 2020-04-22 VITALS — BP 132/74 | HR 88 | Temp 97.3°F | Wt 116.0 lb

## 2020-04-22 DIAGNOSIS — I679 Cerebrovascular disease, unspecified: Secondary | ICD-10-CM | POA: Diagnosis not present

## 2020-04-22 DIAGNOSIS — E785 Hyperlipidemia, unspecified: Secondary | ICD-10-CM | POA: Diagnosis not present

## 2020-04-22 DIAGNOSIS — E1122 Type 2 diabetes mellitus with diabetic chronic kidney disease: Secondary | ICD-10-CM | POA: Diagnosis not present

## 2020-04-22 DIAGNOSIS — Z79899 Other long term (current) drug therapy: Secondary | ICD-10-CM

## 2020-04-22 DIAGNOSIS — I10 Essential (primary) hypertension: Secondary | ICD-10-CM

## 2020-04-22 DIAGNOSIS — G4726 Circadian rhythm sleep disorder, shift work type: Secondary | ICD-10-CM

## 2020-04-22 DIAGNOSIS — E559 Vitamin D deficiency, unspecified: Secondary | ICD-10-CM

## 2020-04-22 DIAGNOSIS — N181 Chronic kidney disease, stage 1: Secondary | ICD-10-CM

## 2020-04-22 DIAGNOSIS — E1169 Type 2 diabetes mellitus with other specified complication: Secondary | ICD-10-CM

## 2020-04-22 DIAGNOSIS — Z6823 Body mass index (BMI) 23.0-23.9, adult: Secondary | ICD-10-CM

## 2020-04-22 MED ORDER — GABAPENTIN 100 MG PO CAPS: ORAL_CAPSULE | ORAL | 3 refills | Status: DC

## 2020-04-22 MED ORDER — ROSUVASTATIN CALCIUM 20 MG PO TABS: ORAL_TABLET | ORAL | 3 refills | Status: DC

## 2020-04-22 NOTE — Patient Instructions (Signed)
Goals    . HEMOGLOBIN A1C < 7.0    . LDL CALC < 70         High-Fiber Eating Plan Fiber, also called dietary fiber, is a type of carbohydrate. It is found foods such as fruits, vegetables, whole grains, and beans. A high-fiber diet can have many health benefits. Your health care provider may recommend a high-fiber diet to help:  Prevent constipation. Fiber can make your bowel movements more regular.  Lower your cholesterol.  Relieve the following conditions: ? Inflammation of veins in the anus (hemorrhoids). ? Inflammation of specific areas of the digestive tract (uncomplicated diverticulosis). ? A problem of the large intestine, also called the colon, that sometimes causes pain and diarrhea (irritable bowel syndrome, or IBS).  Prevent overeating as part of a weight-loss plan.  Prevent heart disease, type 2 diabetes, and certain cancers. What are tips for following this plan? Reading food labels  Check the nutrition facts label on food products for the amount of dietary fiber. Choose foods that have 5 grams of fiber or more per serving.  The goals for recommended daily fiber intake include: ? Men (age 13 or younger): 34-38 g. ? Men (over age 40): 28-34 g. ? Women (age 34 or younger): 25-28 g. ? Women (over age 48): 22-25 g. Your daily fiber goal is _____________ g.   Shopping  Choose whole fruits and vegetables instead of processed forms, such as apple juice or applesauce.  Choose a wide variety of high-fiber foods such as avocados, lentils, oats, and kidney beans.  Read the nutrition facts label of the foods you choose. Be aware of foods with added fiber. These foods often have high sugar and sodium amounts per serving. Cooking  Use whole-grain flour for baking and cooking.  Cook with brown rice instead of white rice. Meal planning  Start the day with a breakfast that is high in fiber, such as a cereal that contains 5 g of fiber or more per serving.  Eat breads and  cereals that are made with whole-grain flour instead of refined flour or white flour.  Eat brown rice, bulgur wheat, or millet instead of white rice.  Use beans in place of meat in soups, salads, and pasta dishes.  Be sure that half of the grains you eat each day are whole grains. General information  You can get the recommended daily intake of dietary fiber by: ? Eating a variety of fruits, vegetables, grains, nuts, and beans. ? Taking a fiber supplement if you are not able to take in enough fiber in your diet. It is better to get fiber through food than from a supplement.  Gradually increase how much fiber you consume. If you increase your intake of dietary fiber too quickly, you may have bloating, cramping, or gas.  Drink plenty of water to help you digest fiber.  Choose high-fiber snacks, such as berries, raw vegetables, nuts, and popcorn. What foods should I eat? Fruits Berries. Pears. Apples. Oranges. Avocado. Prunes and raisins. Dried figs. Vegetables Sweet potatoes. Spinach. Kale. Artichokes. Cabbage. Broccoli. Cauliflower. Green peas. Carrots. Squash. Grains Whole-grain breads. Multigrain cereal. Oats and oatmeal. Brown rice. Barley. Bulgur wheat. Olla. Quinoa. Bran muffins. Popcorn. Rye wafer crackers. Meats and other proteins Navy beans, kidney beans, and pinto beans. Soybeans. Split peas. Lentils. Nuts and seeds. Dairy Fiber-fortified yogurt. Beverages Fiber-fortified soy milk. Fiber-fortified orange juice. Other foods Fiber bars. The items listed above may not be a complete list of recommended foods and beverages.  Contact a dietitian for more information. What foods should I avoid? Fruits Fruit juice. Cooked, strained fruit. Vegetables Fried potatoes. Canned vegetables. Well-cooked vegetables. Grains White bread. Pasta made with refined flour. White rice. Meats and other proteins Fatty cuts of meat. Fried chicken or fried fish. Dairy Milk. Yogurt. Cream  cheese. Sour cream. Fats and oils Butters. Beverages Soft drinks. Other foods Cakes and pastries. The items listed above may not be a complete list of foods and beverages to avoid. Talk with your dietitian about what choices are best for you. Summary  Fiber is a type of carbohydrate. It is found in foods such as fruits, vegetables, whole grains, and beans.  A high-fiber diet has many benefits. It can help to prevent constipation, lower blood cholesterol, aid weight loss, and reduce your risk of heart disease, diabetes, and certain cancers.  Increase your intake of fiber gradually. Increasing fiber too quickly may cause cramping, bloating, and gas. Drink plenty of water while you increase the amount of fiber you consume.  The best sources of fiber include whole fruits and vegetables, whole grains, nuts, seeds, and beans. This information is not intended to replace advice given to you by your health care provider. Make sure you discuss any questions you have with your health care provider. Document Revised: 06/07/2019 Document Reviewed: 06/07/2019 Elsevier Patient Education  2021 [employer].

## 2020-04-23 LAB — CBC WITH DIFFERENTIAL/PLATELET
Absolute Monocytes: 703 cells/uL (ref 200–950)
Basophils Absolute: 53 cells/uL (ref 0–200)
Basophils Relative: 0.6 %
Eosinophils Absolute: 107 cells/uL (ref 15–500)
Eosinophils Relative: 1.2 %
HCT: 39.5 % (ref 35.0–45.0)
Hemoglobin: 12.8 g/dL (ref 11.7–15.5)
Lymphs Abs: 2270 cells/uL (ref 850–3900)
MCH: 31.1 pg (ref 27.0–33.0)
MCHC: 32.4 g/dL (ref 32.0–36.0)
MCV: 96.1 fL (ref 80.0–100.0)
MPV: 10.9 fL (ref 7.5–12.5)
Monocytes Relative: 7.9 %
Neutro Abs: 5767 cells/uL (ref 1500–7800)
Neutrophils Relative %: 64.8 %
Platelets: 315 10*3/uL (ref 140–400)
RBC: 4.11 10*6/uL (ref 3.80–5.10)
RDW: 12.7 % (ref 11.0–15.0)
Total Lymphocyte: 25.5 %
WBC: 8.9 10*3/uL (ref 3.8–10.8)

## 2020-04-23 LAB — HEMOGLOBIN A1C
Hgb A1c MFr Bld: 7 % of total Hgb — ABNORMAL HIGH (ref <5.7)
Mean Plasma Glucose: 154 mg/dL
eAG (mmol/L): 8.5 mmol/L

## 2020-04-23 LAB — COMPLETE METABOLIC PANEL WITH GFR
AG Ratio: 1.7 (calc) (ref 1.0–2.5)
ALT: 22 U/L (ref 6–29)
AST: 17 U/L (ref 10–35)
Albumin: 4.4 g/dL (ref 3.6–5.1)
Alkaline phosphatase (APISO): 57 U/L (ref 37–153)
BUN: 16 mg/dL (ref 7–25)
CO2: 29 mmol/L (ref 20–32)
Calcium: 9.7 mg/dL (ref 8.6–10.4)
Chloride: 98 mmol/L (ref 98–110)
Creat: 0.59 mg/dL (ref 0.50–0.99)
GFR, Est African American: 112 mL/min/{1.73_m2} (ref >=60)
GFR, Est Non African American: 97 mL/min/{1.73_m2} (ref >=60)
Globulin: 2.6 g/dL (calc) (ref 1.9–3.7)
Glucose, Bld: 84 mg/dL (ref 65–99)
Potassium: 5.3 mmol/L (ref 3.5–5.3)
Sodium: 137 mmol/L (ref 135–146)
Total Bilirubin: 0.3 mg/dL (ref 0.2–1.2)
Total Protein: 7 g/dL (ref 6.1–8.1)

## 2020-04-23 LAB — LIPID PANEL
Cholesterol: 91 mg/dL (ref <200)
HDL: 43 mg/dL — ABNORMAL LOW (ref >=50)
LDL Cholesterol (Calc): 35 mg/dL (calc)
Non-HDL Cholesterol (Calc): 48 mg/dL (calc) (ref <130)
Total CHOL/HDL Ratio: 2.1 (calc) (ref <5.0)
Triglycerides: 54 mg/dL (ref <150)

## 2020-04-23 LAB — MAGNESIUM: Magnesium: 1.9 mg/dL (ref 1.5–2.5)

## 2020-04-23 LAB — TSH: TSH: 1.97 mIU/L (ref 0.40–4.50)

## 2020-05-22 ENCOUNTER — Other Ambulatory Visit: Payer: Self-pay

## 2020-05-22 DIAGNOSIS — G4726 Circadian rhythm sleep disorder, shift work type: Secondary | ICD-10-CM

## 2020-05-22 MED ORDER — PHENTERMINE HCL 37.5 MG PO TABS: ORAL_TABLET | ORAL | 1 refills | Status: DC

## 2020-05-22 MED ORDER — ROSUVASTATIN CALCIUM 20 MG PO TABS: ORAL_TABLET | ORAL | 3 refills | Status: DC

## 2020-05-23 ENCOUNTER — Other Ambulatory Visit: Payer: Self-pay | Admitting: Adult Health

## 2020-06-19 ENCOUNTER — Other Ambulatory Visit: Payer: Self-pay | Admitting: Internal Medicine

## 2020-06-19 DIAGNOSIS — G8929 Other chronic pain: Secondary | ICD-10-CM

## 2020-06-23 ENCOUNTER — Other Ambulatory Visit: Payer: Self-pay | Admitting: Adult Health

## 2020-06-23 DIAGNOSIS — G4726 Circadian rhythm sleep disorder, shift work type: Secondary | ICD-10-CM

## 2020-06-23 MED ORDER — PHENTERMINE HCL 37.5 MG PO TABS
ORAL_TABLET | ORAL | 1 refills | Status: DC
Start: 2020-06-23 — End: 2021-03-28

## 2020-06-23 NOTE — Progress Notes (Signed)
Future Appointments  Date Time Provider [place]  07/29/2020  9:30 AM [name], MD [facility] None  01/22/2021 11:00 AM [name], MD [facility] None    PDMP reviewed for phentermine refill request.

## 2020-06-27 ENCOUNTER — Emergency Department (HOSPITAL_COMMUNITY): Payer: BC Managed Care – PPO

## 2020-06-27 ENCOUNTER — Other Ambulatory Visit: Payer: Self-pay

## 2020-06-27 ENCOUNTER — Encounter (HOSPITAL_COMMUNITY): Payer: Self-pay | Admitting: Emergency Medicine

## 2020-06-27 ENCOUNTER — Emergency Department (HOSPITAL_COMMUNITY)
Admission: EM | Admit: 2020-06-27 | Discharge: 2020-06-27 | Disposition: A | Discharge status: Home / Self Care | Payer: BC Managed Care – PPO | Attending: Emergency Medicine | Admitting: Emergency Medicine

## 2020-06-27 DIAGNOSIS — R42 Dizziness and giddiness: Secondary | ICD-10-CM | POA: Insufficient documentation

## 2020-06-27 DIAGNOSIS — R079 Chest pain, unspecified: Secondary | ICD-10-CM | POA: Diagnosis not present

## 2020-06-27 DIAGNOSIS — Z79899 Other long term (current) drug therapy: Secondary | ICD-10-CM | POA: Insufficient documentation

## 2020-06-27 DIAGNOSIS — J45909 Unspecified asthma, uncomplicated: Secondary | ICD-10-CM | POA: Diagnosis not present

## 2020-06-27 DIAGNOSIS — R5383 Other fatigue: Secondary | ICD-10-CM | POA: Diagnosis not present

## 2020-06-27 DIAGNOSIS — Z7984 Long term (current) use of oral hypoglycemic drugs: Secondary | ICD-10-CM | POA: Insufficient documentation

## 2020-06-27 DIAGNOSIS — R531 Weakness: Secondary | ICD-10-CM | POA: Diagnosis not present

## 2020-06-27 DIAGNOSIS — E1169 Type 2 diabetes mellitus with other specified complication: Secondary | ICD-10-CM | POA: Diagnosis not present

## 2020-06-27 DIAGNOSIS — I10 Essential (primary) hypertension: Secondary | ICD-10-CM | POA: Insufficient documentation

## 2020-06-27 DIAGNOSIS — E785 Hyperlipidemia, unspecified: Secondary | ICD-10-CM | POA: Diagnosis not present

## 2020-06-27 DIAGNOSIS — R4182 Altered mental status, unspecified: Secondary | ICD-10-CM | POA: Diagnosis not present

## 2020-06-27 DIAGNOSIS — R059 Cough, unspecified: Secondary | ICD-10-CM | POA: Diagnosis not present

## 2020-06-27 DIAGNOSIS — Z7982 Long term (current) use of aspirin: Secondary | ICD-10-CM | POA: Insufficient documentation

## 2020-06-27 LAB — URINALYSIS, ROUTINE W REFLEX MICROSCOPIC
Bilirubin Urine: NEGATIVE
Glucose, UA: NEGATIVE mg/dL
Hgb urine dipstick: NEGATIVE
Ketones, ur: NEGATIVE mg/dL
Leukocytes,Ua: NEGATIVE
Nitrite: NEGATIVE
Protein, ur: NEGATIVE mg/dL
Specific Gravity, Urine: 1.018 (ref 1.005–1.030)
pH: 6 (ref 5.0–8.0)

## 2020-06-27 LAB — CBC
HCT: 41.9 % (ref 36.0–46.0)
Hemoglobin: 12.9 g/dL (ref 12.0–15.0)
MCH: 31 pg (ref 26.0–34.0)
MCHC: 30.8 g/dL (ref 30.0–36.0)
MCV: 100.7 fL — ABNORMAL HIGH (ref 80.0–100.0)
Platelets: 248 10*3/uL (ref 150–400)
RBC: 4.16 MIL/uL (ref 3.87–5.11)
RDW: 13.5 % (ref 11.5–15.5)
WBC: 8.8 10*3/uL (ref 4.0–10.5)
nRBC: 0 % (ref 0.0–0.2)

## 2020-06-27 LAB — BASIC METABOLIC PANEL
Anion gap: 8 (ref 5–15)
BUN: 14 mg/dL (ref 8–23)
CO2: 27 mmol/L (ref 22–32)
Calcium: 8.8 mg/dL — ABNORMAL LOW (ref 8.9–10.3)
Chloride: 100 mmol/L (ref 98–111)
Creatinine, Ser: 0.61 mg/dL (ref 0.44–1.00)
GFR, Estimated: >60 mL/min (ref >60)
Glucose, Bld: 120 mg/dL — ABNORMAL HIGH (ref 70–99)
Potassium: 4 mmol/L (ref 3.5–5.1)
Sodium: 135 mmol/L (ref 135–145)

## 2020-06-27 LAB — CBG MONITORING, ED
Glucose-Capillary: 117 mg/dL — ABNORMAL HIGH (ref 70–99)
Glucose-Capillary: 89 mg/dL (ref 70–99)

## 2020-06-27 MED ORDER — SODIUM CHLORIDE 0.9 % IV BOLUS
1000.0000 mL | Freq: Once | INTRAVENOUS | Status: AC
  Administered 2020-06-27: 1000 mL via INTRAVENOUS

## 2020-06-27 NOTE — ED Triage Notes (Signed)
Patient works 3rd shift 6p-4a. Came home went to sleep and woke up at 0645 today. Husband states she does not get enough sleep. After waking up worked in house completing house chores and became tired and fatigued. When using the microwave at 1000 husband had wife sit down while he finished cooking. Patient nodded off while sitting possible syncope for a couple of seconds. Once awake was talking how did not make sense for about a minute. Patient states she is tired. Answering and following commands appropriate. Moves all extremities bilateral equal moderate strength. History of right eye "wandering" with surgery.

## 2020-06-27 NOTE — Discharge Instructions (Signed)
You are fatigued from lack of sleep.  Please get enough rest.  See your doctor for follow-up  Return to ER if you are passing out, lethargy, seizures

## 2020-06-27 NOTE — ED Provider Notes (Signed)
[facility] EMERGENCY DEPARTMENT Provider Note   CSN: [id number] Arrival date & time: 06/27/20  1107     History Chief Complaint  Patient presents with  . Weakness  . Fatigue    [name] is a 65 y.o. female hx of DM, HL, HTN, asthma here with weakness, fatigue.  Patient states that she works third shift.  She got home around 5 AM.  She slept until 7 AM.  She states that she did not get much sleep and was tried microwaved some food around 10 AM and felt very lightheaded and dizzy.  She apparently nodded off and was unable to talk for about a minute.  There is no seizure like activity.  She states that she had previous eye surgery in her eyes do not align well at baseline.  She denies any double vision.  She denies any trouble speaking.  She states that she just very tired right now.   The history is provided by the patient.       Past Medical History:  Diagnosis Date  . Allergic rhinitis   . ASTHMA   . BICUSPID AORTIC VALVE   . CEREBROVASCULAR DISEASE   . DEGENERATIVE JOINT DISEASE   . Diabetes mellitus   . DJD (degenerative joint disease)   . GERD   . HSV-1 (herpes simplex virus 1) infection   . HYPERLIPIDEMIA   . Hypertension   . Palpitation   . Vitamin D deficiency     Patient Active Problem List   Diagnosis Date Noted  . BMI 23.0-23.9, adult 04/21/2020  . Degeneration of lumbar or lumbosacral intervertebral disc 10/10/2019  . Type 2 diabetes with stage 1 chronic kidney disease GFR>90 ([place]) 04/22/2015  . Esotropia of right eye 04/22/2015  . Palpitations 04/11/2015  . Abnormal electrocardiogram 04/11/2015  . Sleep disorder, shift-work 10/13/2014  . Left carotid bruit 02/14/2014  . Medication management 02/14/2014  . Essential hypertension 07/27/2013  . Hyperlipidemia associated with type 2 diabetes mellitus ([place]) 02/01/2013  . Vitamin D deficiency 02/01/2013  . BICUSPID AORTIC VALVE 07/03/2008  . Cerebrovascular disease 07/02/2008   . Asthma 07/02/2008  . GERD 07/02/2008  . Osteoarthritis 07/02/2008    Past Surgical History:  Procedure Laterality Date  . ABDOMINAL HYSTERECTOMY    . repair of trigger finger       OB History   No obstetric history on file.     Family History  Problem Relation Age of Onset  . Coronary artery disease Mother   . Heart disease Mother   . Hypertension Mother   . Diabetes Father   . Hypertension Father   . Breast cancer Maternal Aunt     Social History   Tobacco Use  . Smoking status: Never Smoker  . Smokeless tobacco: Never Used  Substance Use Topics  . Alcohol use: Yes    Comment: very rarely  . Drug use: No    Home Medications Prior to Admission medications   Medication Sig Start Date End Date Taking? Authorizing Provider  albuterol (PROVENTIL HFA;VENTOLIN HFA) 108 (90 Base) MCG/ACT inhaler Use 1  To 2 inhalations 5 minutes apart every 4 hours if needed to rescue asthma 06/20/17 07/11/19  Liane Comber, NP  aspirin EC 81 MG tablet Take 81 mg by mouth daily.    [provider]  B Complex-C (SUPER B COMPLEX PO) Take 1 tablet by mouth daily. Takes every other day    [provider]  BAYER MICROLET LANCETS lancets Check  blood sugar 1 time daily-DX-E11.9. 04/08/16   [name], MD  Cholecalciferol (VITAMIN D PO) Take 2 capsules by mouth daily. Take 4000 units by mouth twice a day    [provider]  Cinnamon 500 MG capsule Take 1,000 mg by mouth daily.     [provider]  cyclobenzaprine (FLEXERIL) 10 MG tablet Take 1/2 to 1 tablet at Bedtime for Muscle Spasm 01/22/19   [name], MD  Diclofenac Sodium 1.5 % SOLN Place a small amount of gel on the bilateral hands for hand arthritis 02/19/16   Rolene Course, PA-C  diphenhydrAMINE (BENADRYL) 25 MG tablet Take 25 mg by mouth. Takes PRN at bedtime.    [provider]  famotidine (PEPCID) 40 MG tablet TAKE 1 TABLET BY MOUTH DAILY FOR ACID , INDIGESTION AND HEARTBURN  01/04/20   Liane Comber, NP  ferrous sulfate 325 (65 FE) MG tablet Take by mouth.    [provider]  fluconazole (DIFLUCAN) 150 MG tablet Use once for yeast infection as needed 06/28/18   [name], MD  furosemide (LASIX) 20 MG tablet Take by mouth. 03/19/16   [provider]  gabapentin (NEURONTIN) 100 MG capsule TAKE 1 TO 3 CAPSULES AT NIGHT 04/22/20   Liane Comber, NP  glucose blood (BAYER CONTOUR NEXT TEST) test strip Check blood sugar 1 time daily-DX-E11.9. 04/06/16   [name], MD  lisinopril-hydrochlorothiazide (ZESTORETIC) 10-12.5 MG tablet Take       1 tablet       Daily        for BP & Diabetic Kidney Protection 01/02/20   Garnet Sierras, NP  MAGNESIUM PO Take 2 tablets by mouth daily.    [provider]  meloxicam (MOBIC) 15 MG tablet TAKE ONE-HALF TO 1 TABLET BY MOUTH DAILY WITH FOOD FOR PAIN & INFLAMMATION - LIMIT TO MAXIMUM OF 5 DAYS /WEEK 06/19/20   Liane Comber, NP  metFORMIN (GLUCOPHAGE-XR) 500 MG 24 hr tablet TAKE 2 TABLETS BY MOUTH TWICE DAILY WITH MEALS. 01/17/20   Garnet Sierras, NP  metoprolol succinate (TOPROL-XL) 25 MG 24 hr tablet Take      1 tablet     Daily       for BP 01/02/20   McClanahan, Kyra, NP  mometasone (NASONEX) 50 MCG/ACT nasal spray Place 2 sprays into the nose daily. 06/30/18 07/11/19  [name], MD  Multiple Vitamin (MULITIVITAMIN WITH MINERALS) TABS Take 1 tablet by mouth daily.    [provider]  OVER THE COUNTER MEDICATION Takes Omega-Q vitamin which contains Tumeric.    [provider]  phentermine (ADIPEX-P) 37.5 MG tablet Take 1/2 to 1 tablet every morning as needed for dieting & weight loss 06/23/20   Liane Comber, NP  potassium chloride SA (KLOR-CON M20) 20 MEQ tablet TAKE 1 TABLET BY MOUTH 3 TIMES A DAY AFTER MEALS 08/18/19   Liane Comber, NP  Psyllium (METAMUCIL PO) Take 1 Dose by mouth daily.    [provider]  rOPINIRole (REQUIP) 0.5 MG tablet TAKE 1 TABLET 3 TIMES  A DAY FOR RESTLESS LEGS 11/18/19   [name], MD  rosuvastatin (CRESTOR) 20 MG tablet TAKE ONE TABLET DAILY FOR CHOLESTEROL 05/22/20   Liane Comber, NP  traMADol (ULTRAM) 50 MG tablet Take by mouth. Patient not taking: Reported on 04/22/2020    [provider]  triamcinolone cream (KENALOG) 0.1 % Apply 1 application topically 4 (four) times daily as needed. 01/25/17   [name], MD    Allergies  Naprosyn [naproxen]  Review of Systems   Review of Systems  Neurological: Positive for weakness.  All other systems reviewed and are negative.   Physical Exam Updated Vital Signs BP 140/69 (BP Location: Left Arm)   Pulse 84   Temp 97.6 F (36.4 C) (Oral)   Resp 18   Ht 4\' 11"  (1.499 m)   Wt 52.2 kg   SpO2 100%   BMI 23.23 kg/m   Physical Exam Vitals and nursing note reviewed.  Constitutional:      Comments: Tired sleepy  HENT:     Head: Normocephalic.     Nose: Nose normal.     Mouth/Throat:     Mouth: Mucous membranes are dry.  Eyes:     Extraocular Movements: Extraocular movements intact.     Pupils: Pupils are equal, round, and reactive to light.     Comments: Right eye moves laterally and that is baseline  Cardiovascular:     Rate and Rhythm: Normal rate and regular rhythm.     Pulses: Normal pulses.     Heart sounds: Normal heart sounds.  Pulmonary:     Effort: Pulmonary effort is normal.     Breath sounds: Normal breath sounds.  Abdominal:     General: Abdomen is flat.     Palpations: Abdomen is soft.  Musculoskeletal:        General: Normal range of motion.     Cervical back: Normal range of motion and neck supple.  Skin:    General: Skin is warm.     Capillary Refill: Capillary refill takes less than 2 seconds.  Neurological:     General: No focal deficit present.     Mental Status: She is oriented to person, place, and time.     Comments: No obvious facial droop with normal strength and sensation bilateral  Psychiatric:        Mood  and Affect: Mood normal.        Behavior: Behavior normal.     ED Results / Procedures / Treatments   Labs (all labs ordered are listed, but only abnormal results are displayed) Labs Reviewed  BASIC METABOLIC PANEL - Abnormal; Notable for the following components:      Result Value   Glucose, Bld 120 (*)    Calcium 8.8 (*)    All other components within normal limits  CBC - Abnormal; Notable for the following components:   MCV 100.7 (*)    All other components within normal limits  CBG MONITORING, ED - Abnormal; Notable for the following components:   Glucose-Capillary 117 (*)    All other components within normal limits  URINALYSIS, ROUTINE W REFLEX MICROSCOPIC  CBG MONITORING, ED    EKG EKG Interpretation  Date/Time:  Friday Jun 27 2020 11:20:25 EDT Ventricular Rate:  84 PR Interval:  154 QRS Duration: 72 QT Interval:  356 QTC Calculation: 420 R Axis:   53 Text Interpretation: Normal sinus rhythm Anteroseptal infarct , age undetermined Abnormal ECG No significant change since last tracing Confirmed by Wandra Arthurs [id number]) on 06/27/2020 3:11:56 PM   Radiology CT Head Wo Contrast  Result Date: 06/27/2020 CLINICAL DATA:  In this with altered mental status EXAM: CT HEAD WITHOUT CONTRAST TECHNIQUE: Contiguous axial images were obtained from the base of the skull through the vertex without intravenous contrast. COMPARISON:  Brain MRI July 29, 2009 FINDINGS: Brain: Ventricles and sulci are normal in size and configuration. There is no intracranial mass, hemorrhage, extra-axial fluid collection,  or midline shift. There is mild decreased attenuation in portions of the centra semiovale bilaterally. No acute infarct evident. Vascular: No hyperdense vessel. Foci of calcification noted in each carotid siphon region. Skull: The bony calvarium appears intact. Sinuses/Orbits: There is opacification in posterior ethmoid air cells bilaterally with mucosal thickening in several ethmoid air  cells elsewhere. There is a degree of leftward deviation of the nasal septum. Orbits appear symmetric bilaterally. Other: Mastoid air cells are clear. IMPRESSION: Mild periventricular small vessel disease. No acute infarct evident. No mass or hemorrhage. There are foci of arterial vascular calcification. There is paranasal sinus disease at several sites. Electronically Signed   By: Lowella Grip III M.D.   On: 06/27/2020 16:08   DG Chest Port 1 View  Result Date: 06/27/2020 CLINICAL DATA:  Cough and chest pain EXAM: PORTABLE CHEST 1 VIEW COMPARISON:  November 17, 2011 chest radiograph; chest CT March 07, 2020 FINDINGS: Lungs are clear. Heart size and pulmonary vascularity are normal. No adenopathy. No pneumothorax. No bone lesions. IMPRESSION: Lungs clear.  Cardiac silhouette within normal limits. Electronically Signed   By: Lowella Grip III M.D.   On: 06/27/2020 15:40    Procedures Procedures   Medications Ordered in ED Medications  sodium chloride 0.9 % bolus 1,000 mL (1,000 mLs Intravenous New Bag/Given 06/27/20 1523)    ED Course  I have reviewed the triage vital signs and the nursing notes.  Pertinent labs & imaging results that were available during my care of the patient were reviewed by me and considered in my medical decision making (see chart for details).    MDM Rules/Calculators/A&P                         [name] is a 65 y.o. female here presenting with tiredness and decreased responsiveness.  She works third shift and did not get enough rest.  I think likely from being too tired.  No seizure-like activity.  We will get a CT head to rule out bleed.  We will get basic blood work and hydrate patient  5:21 PM Labs unremarkable.  CT head showed no obvious bleed.  Patient is appears very tired.  Told her that she should get some rest tonight.  She does not go to work until Sunday night.   Final Clinical Impression(s) / ED Diagnoses Final diagnoses:  None     Rx / DC Orders ED Discharge Orders    None       Drenda Freeze, MD 06/27/20 1723

## 2020-07-08 ENCOUNTER — Ambulatory Visit (HOSPITAL_COMMUNITY)
Admission: EM | Admit: 2020-07-08 | Discharge: 2020-07-08 | Disposition: A | Discharge status: Home / Self Care | Payer: BC Managed Care – PPO | Attending: Internal Medicine | Admitting: Internal Medicine

## 2020-07-08 ENCOUNTER — Emergency Department (HOSPITAL_COMMUNITY)
Admission: EM | Admit: 2020-07-08 | Discharge: 2020-07-08 | Disposition: A | Discharge status: Home / Self Care | Payer: BC Managed Care – PPO | Attending: Emergency Medicine | Admitting: Emergency Medicine

## 2020-07-08 ENCOUNTER — Emergency Department (HOSPITAL_COMMUNITY): Payer: BC Managed Care – PPO

## 2020-07-08 ENCOUNTER — Encounter (HOSPITAL_COMMUNITY): Payer: Self-pay | Admitting: *Deleted

## 2020-07-08 ENCOUNTER — Other Ambulatory Visit: Payer: Self-pay

## 2020-07-08 ENCOUNTER — Encounter (HOSPITAL_COMMUNITY): Payer: Self-pay

## 2020-07-08 DIAGNOSIS — K2100 Gastro-esophageal reflux disease with esophagitis, without bleeding: Secondary | ICD-10-CM | POA: Insufficient documentation

## 2020-07-08 DIAGNOSIS — R0789 Other chest pain: Secondary | ICD-10-CM

## 2020-07-08 DIAGNOSIS — Z7982 Long term (current) use of aspirin: Secondary | ICD-10-CM | POA: Insufficient documentation

## 2020-07-08 DIAGNOSIS — Z79899 Other long term (current) drug therapy: Secondary | ICD-10-CM | POA: Diagnosis not present

## 2020-07-08 DIAGNOSIS — Z7984 Long term (current) use of oral hypoglycemic drugs: Secondary | ICD-10-CM | POA: Insufficient documentation

## 2020-07-08 DIAGNOSIS — I129 Hypertensive chronic kidney disease with stage 1 through stage 4 chronic kidney disease, or unspecified chronic kidney disease: Secondary | ICD-10-CM | POA: Diagnosis not present

## 2020-07-08 DIAGNOSIS — N181 Chronic kidney disease, stage 1: Secondary | ICD-10-CM | POA: Insufficient documentation

## 2020-07-08 DIAGNOSIS — R079 Chest pain, unspecified: Secondary | ICD-10-CM | POA: Diagnosis not present

## 2020-07-08 DIAGNOSIS — R1013 Epigastric pain: Secondary | ICD-10-CM | POA: Diagnosis not present

## 2020-07-08 DIAGNOSIS — J45909 Unspecified asthma, uncomplicated: Secondary | ICD-10-CM | POA: Insufficient documentation

## 2020-07-08 DIAGNOSIS — E1122 Type 2 diabetes mellitus with diabetic chronic kidney disease: Secondary | ICD-10-CM | POA: Insufficient documentation

## 2020-07-08 LAB — HEPATIC FUNCTION PANEL
ALT: 18 U/L (ref 0–44)
AST: 18 U/L (ref 15–41)
Albumin: 3.7 g/dL (ref 3.5–5.0)
Alkaline Phosphatase: 67 U/L (ref 38–126)
Bilirubin, Direct: <0.1 mg/dL (ref 0.0–0.2)
Total Bilirubin: 0.3 mg/dL (ref 0.3–1.2)
Total Protein: 7.1 g/dL (ref 6.5–8.1)

## 2020-07-08 LAB — TROPONIN I (HIGH SENSITIVITY)
Troponin I (High Sensitivity): <2 ng/L (ref <18)
Troponin I (High Sensitivity): <2 ng/L (ref <18)

## 2020-07-08 LAB — CBC
HCT: 41.1 % (ref 36.0–46.0)
Hemoglobin: 12.9 g/dL (ref 12.0–15.0)
MCH: 31.1 pg (ref 26.0–34.0)
MCHC: 31.4 g/dL (ref 30.0–36.0)
MCV: 99 fL (ref 80.0–100.0)
Platelets: 279 10*3/uL (ref 150–400)
RBC: 4.15 MIL/uL (ref 3.87–5.11)
RDW: 13.3 % (ref 11.5–15.5)
WBC: 8.1 10*3/uL (ref 4.0–10.5)
nRBC: 0 % (ref 0.0–0.2)

## 2020-07-08 LAB — BASIC METABOLIC PANEL
Anion gap: 8 (ref 5–15)
BUN: 16 mg/dL (ref 8–23)
CO2: 33 mmol/L — ABNORMAL HIGH (ref 22–32)
Calcium: 8.9 mg/dL (ref 8.9–10.3)
Chloride: 98 mmol/L (ref 98–111)
Creatinine, Ser: 0.47 mg/dL (ref 0.44–1.00)
GFR, Estimated: >60 mL/min (ref >60)
Glucose, Bld: 92 mg/dL (ref 70–99)
Potassium: 4 mmol/L (ref 3.5–5.1)
Sodium: 139 mmol/L (ref 135–145)

## 2020-07-08 MED ORDER — PANTOPRAZOLE SODIUM 20 MG PO TBEC: 20.0000 mg | DELAYED_RELEASE_TABLET | Freq: Every day | ORAL | 1 refills | Status: DC

## 2020-07-08 MED ORDER — PANTOPRAZOLE SODIUM 40 MG IV SOLR
40.0000 mg | Freq: Once | INTRAVENOUS | Status: AC
  Administered 2020-07-08: 40 mg via INTRAVENOUS
  Filled 2020-07-08: qty 40

## 2020-07-08 NOTE — Discharge Instructions (Addendum)
Follow-up with your doctor in a week for recheck or sooner if problems

## 2020-07-08 NOTE — ED Provider Notes (Signed)
[facility] DEPT Provider Note   CSN: [id number] Arrival date & time: 07/08/20  7741     History Chief Complaint  Patient presents with  . Chest Pain    [name] is a 65 y.o. female.  Patient complains of epigastric pain.  No fever no chills no cough no shortness of breath  The history is provided by the patient and medical records. No language interpreter was used.  Chest Pain Pain location:  Epigastric Pain quality: aching   Pain radiates to:  Does not radiate Pain severity:  Mild Onset quality:  Sudden Timing:  Constant Progression:  Worsening Chronicity:  New Context: not breathing   Relieved by:  Nothing Associated symptoms: abdominal pain   Associated symptoms: no back pain, no cough, no fatigue and no headache        Past Medical History:  Diagnosis Date  . Allergic rhinitis   . ASTHMA   . BICUSPID AORTIC VALVE   . CEREBROVASCULAR DISEASE   . DEGENERATIVE JOINT DISEASE   . Diabetes mellitus   . DJD (degenerative joint disease)   . GERD   . HSV-1 (herpes simplex virus 1) infection   . HYPERLIPIDEMIA   . Hypertension   . Palpitation   . Vitamin D deficiency     Patient Active Problem List   Diagnosis Date Noted  . BMI 23.0-23.9, adult 04/21/2020  . Degeneration of lumbar or lumbosacral intervertebral disc 10/10/2019  . Type 2 diabetes with stage 1 chronic kidney disease GFR>90 ([place]) 04/22/2015  . Esotropia of right eye 04/22/2015  . Palpitations 04/11/2015  . Abnormal electrocardiogram 04/11/2015  . Sleep disorder, shift-work 10/13/2014  . Left carotid bruit 02/14/2014  . Medication management 02/14/2014  . Essential hypertension 07/27/2013  . Hyperlipidemia associated with type 2 diabetes mellitus ([place]) 02/01/2013  . Vitamin D deficiency 02/01/2013  . BICUSPID AORTIC VALVE 07/03/2008  . Cerebrovascular disease 07/02/2008  . Asthma 07/02/2008  . GERD 07/02/2008  . Osteoarthritis 07/02/2008     Past Surgical History:  Procedure Laterality Date  . ABDOMINAL HYSTERECTOMY    . repair of trigger finger       OB History   No obstetric history on file.     Family History  Problem Relation Age of Onset  . Coronary artery disease Mother   . Heart disease Mother   . Hypertension Mother   . Diabetes Father   . Hypertension Father   . Breast cancer Maternal Aunt     Social History   Tobacco Use  . Smoking status: Never Smoker  . Smokeless tobacco: Never Used  Substance Use Topics  . Alcohol use: Yes    Comment: very rarely  . Drug use: No    Home Medications Prior to Admission medications   Medication Sig Start Date End Date Taking? Authorizing Provider  pantoprazole (PROTONIX) 20 MG tablet Take 1 tablet (20 mg total) by mouth daily. 07/08/20  Yes Milton Ferguson, MD  albuterol (PROVENTIL HFA;VENTOLIN HFA) 108 (90 Base) MCG/ACT inhaler Use 1  To 2 inhalations 5 minutes apart every 4 hours if needed to rescue asthma 06/20/17 07/11/19  Liane Comber, NP  aspirin EC 81 MG tablet Take 81 mg by mouth daily.    [provider]  B Complex-C (SUPER B COMPLEX PO) Take 1 tablet by mouth daily. Takes every other day    [provider]  BAYER MICROLET LANCETS lancets Check blood sugar 1 time daily-DX-E11.9. 04/08/16   [name], MD  Cholecalciferol (VITAMIN D PO) Take 2 capsules by mouth daily. Take 4000 units by mouth twice a day    [provider]  Cinnamon 500 MG capsule Take 1,000 mg by mouth daily.     [provider]  cyclobenzaprine (FLEXERIL) 10 MG tablet Take 1/2 to 1 tablet at Bedtime for Muscle Spasm 01/22/19   [name], MD  Diclofenac Sodium 1.5 % SOLN Place a small amount of gel on the bilateral hands for hand arthritis 02/19/16   Rolene Course, PA-C  diphenhydrAMINE (BENADRYL) 25 MG tablet Take 25 mg by mouth. Takes PRN at bedtime.    [provider]  famotidine (PEPCID) 40 MG tablet TAKE 1 TABLET BY MOUTH  DAILY FOR ACID , INDIGESTION AND HEARTBURN 01/04/20   Liane Comber, NP  ferrous sulfate 325 (65 FE) MG tablet Take by mouth.    [provider]  fluconazole (DIFLUCAN) 150 MG tablet Use once for yeast infection as needed 06/28/18   [name], MD  furosemide (LASIX) 20 MG tablet Take by mouth. 03/19/16   [provider]  gabapentin (NEURONTIN) 100 MG capsule TAKE 1 TO 3 CAPSULES AT NIGHT 04/22/20   Liane Comber, NP  glucose blood (BAYER CONTOUR NEXT TEST) test strip Check blood sugar 1 time daily-DX-E11.9. 04/06/16   [name], MD  lisinopril-hydrochlorothiazide (ZESTORETIC) 10-12.5 MG tablet Take       1 tablet       Daily        for BP & Diabetic Kidney Protection 01/02/20   Garnet Sierras, NP  MAGNESIUM PO Take 2 tablets by mouth daily.    [provider]  meloxicam (MOBIC) 15 MG tablet TAKE ONE-HALF TO 1 TABLET BY MOUTH DAILY WITH FOOD FOR PAIN & INFLAMMATION - LIMIT TO MAXIMUM OF 5 DAYS /WEEK 06/19/20   Liane Comber, NP  metFORMIN (GLUCOPHAGE-XR) 500 MG 24 hr tablet TAKE 2 TABLETS BY MOUTH TWICE DAILY WITH MEALS. 01/17/20   Garnet Sierras, NP  metoprolol succinate (TOPROL-XL) 25 MG 24 hr tablet Take      1 tablet     Daily       for BP 01/02/20   McClanahan, Kyra, NP  mometasone (NASONEX) 50 MCG/ACT nasal spray Place 2 sprays into the nose daily. 06/30/18 07/11/19  [name], MD  Multiple Vitamin (MULITIVITAMIN WITH MINERALS) TABS Take 1 tablet by mouth daily.    [provider]  OVER THE COUNTER MEDICATION Takes Omega-Q vitamin which contains Tumeric.    [provider]  phentermine (ADIPEX-P) 37.5 MG tablet Take 1/2 to 1 tablet every morning as needed for dieting & weight loss 06/23/20   Liane Comber, NP  potassium chloride SA (KLOR-CON M20) 20 MEQ tablet TAKE 1 TABLET BY MOUTH 3 TIMES A DAY AFTER MEALS 08/18/19   Liane Comber, NP  Psyllium (METAMUCIL PO) Take 1 Dose by mouth daily.    [provider]  rOPINIRole  (REQUIP) 0.5 MG tablet TAKE 1 TABLET 3 TIMES A DAY FOR RESTLESS LEGS 11/18/19   [name], MD  rosuvastatin (CRESTOR) 20 MG tablet TAKE ONE TABLET DAILY FOR CHOLESTEROL 05/22/20   Liane Comber, NP  traMADol (ULTRAM) 50 MG tablet Take by mouth. Patient not taking: Reported on 04/22/2020    [provider]  triamcinolone cream (KENALOG) 0.1 % Apply 1 application topically 4 (four) times daily as needed. 01/25/17   [name], MD    Allergies    Naprosyn [naproxen]  Review of Systems   Review of  Systems  Constitutional: Negative for appetite change and fatigue.  HENT: Negative for congestion, ear discharge and sinus pressure.   Eyes: Negative for discharge.  Respiratory: Negative for cough.   Cardiovascular: Positive for chest pain.  Gastrointestinal: Positive for abdominal pain. Negative for diarrhea.  Genitourinary: Negative for frequency and hematuria.  Musculoskeletal: Negative for back pain.  Skin: Negative for rash.  Neurological: Negative for seizures and headaches.  Psychiatric/Behavioral: Negative for hallucinations.    Physical Exam Updated Vital Signs BP (!) 150/66   Pulse 76   Temp 98.3 F (36.8 C) (Oral)   Resp 11   SpO2 100%   Physical Exam Vitals and nursing note reviewed.  Constitutional:      Appearance: She is well-developed.  HENT:     Head: Normocephalic.     Nose: Nose normal.  Eyes:     General: No scleral icterus.    Conjunctiva/sclera: Conjunctivae normal.  Neck:     Thyroid: No thyromegaly.  Cardiovascular:     Rate and Rhythm: Normal rate and regular rhythm.     Heart sounds: No murmur heard. No friction rub. No gallop.   Pulmonary:     Breath sounds: No stridor. No wheezing or rales.  Chest:     Chest wall: No tenderness.  Abdominal:     General: There is no distension.     Tenderness: There is abdominal tenderness. There is no rebound.  Musculoskeletal:        General: Normal range of motion.     Cervical back:  Neck supple.  Lymphadenopathy:     Cervical: No cervical adenopathy.  Skin:    Findings: No erythema or rash.  Neurological:     Mental Status: She is alert and oriented to person, place, and time.     Motor: No abnormal muscle tone.     Coordination: Coordination normal.  Psychiatric:        Behavior: Behavior normal.     ED Results / Procedures / Treatments   Labs (all labs ordered are listed, but only abnormal results are displayed) Labs Reviewed  BASIC METABOLIC PANEL - Abnormal; Notable for the following components:      Result Value   CO2 33 (*)    All other components within normal limits  CBC  HEPATIC FUNCTION PANEL  TROPONIN I (HIGH SENSITIVITY)  TROPONIN I (HIGH SENSITIVITY)    EKG EKG Interpretation  Date/Time:  Tuesday Jul 08 2020 18:57:31 EDT Ventricular Rate:  80 PR Interval:  164 QRS Duration: 67 QT Interval:  360 QTC Calculation: 416 R Axis:   52 Text Interpretation: Sinus rhythm Probable anteroseptal infarct, recent Lateral leads are also involved 12 Lead; [place] Confirmed by Milton Ferguson [phone]) on 07/08/2020 10:25:15 PM   Radiology DG Chest 2 View  Result Date: 07/08/2020 CLINICAL DATA:  Chest pain EXAM: CHEST - 2 VIEW COMPARISON:  06/27/2020 FINDINGS: Lungs are well expanded, symmetric, and clear. No pneumothorax or pleural effusion. Cardiac size within normal limits. Pulmonary vascularity is normal. Osseous structures are age-appropriate. No acute bone abnormality. IMPRESSION: No active cardiopulmonary disease. Electronically Signed   By: Fidela Salisbury MD   On: 07/08/2020 19:59    Procedures Procedures   Medications Ordered in ED Medications  pantoprazole (PROTONIX) injection 40 mg (40 mg Intravenous Given 07/08/20 2010)    ED Course  I have reviewed the triage vital signs and the nursing notes.  Pertinent labs & imaging results that were available during my care of the patient were  reviewed by me and considered in my medical  decision making (see chart for details).    MDM Rules/Calculators/A&P                          Patient with abdominal pain.  She improved with Protonix.  She will be sent home with Protonix and follow-up with her PCP Final Clinical Impression(s) / ED Diagnoses Final diagnoses:  Atypical chest pain  Gastroesophageal reflux disease with esophagitis without hemorrhage    Rx / DC Orders ED Discharge Orders         Ordered    pantoprazole (PROTONIX) 20 MG tablet  Daily        07/08/20 2229           Milton Ferguson, MD 07/08/20 2232

## 2020-07-08 NOTE — ED Provider Notes (Signed)
[place]    CSN: [id number] Arrival date & time: 07/08/20  1732      History   Chief Complaint Chief Complaint  Patient presents with  . Diarrhea    HPI [name] is a 65 y.o. female with multiple medical problems presents to urgent care today with complaints of chest pain.  Reports intermittent midsternal chest pain since Sunday, described as "tightness" and nonradiating.  At that time she took Alka-Seltzer which helped to relieve pain.  Patient with subsequent pain last evening and again today unrelieved with Alka-Seltzer.  Diarrhea also mentioned in triage note though patient reports this has been ongoing for months with no change recently.  Of note, patient evaluated in the ED on 5/13 for complaints of extreme fatigue and dizziness.  Work-up at that time included EKG, head CT, chest x-ray, CBC and bmet all which were unremarkable.  She states she began feeling better after ED visit.  At time of my exam patient not having any pain.  She denies recent fever, chills, shortness of breath, palpitations, diaphoresis, abdominal pain, lower extremity swelling.  She does endorse some nausea along with her chest pain.    Past Medical History:  Diagnosis Date  . Allergic rhinitis   . ASTHMA   . BICUSPID AORTIC VALVE   . CEREBROVASCULAR DISEASE   . DEGENERATIVE JOINT DISEASE   . Diabetes mellitus   . DJD (degenerative joint disease)   . GERD   . HSV-1 (herpes simplex virus 1) infection   . HYPERLIPIDEMIA   . Hypertension   . Palpitation   . Vitamin D deficiency     Patient Active Problem List   Diagnosis Date Noted  . BMI 23.0-23.9, adult 04/21/2020  . Degeneration of lumbar or lumbosacral intervertebral disc 10/10/2019  . Type 2 diabetes with stage 1 chronic kidney disease GFR>90 ([place]) 04/22/2015  . Esotropia of right eye 04/22/2015  . Palpitations 04/11/2015  . Abnormal electrocardiogram 04/11/2015  . Sleep disorder, shift-work 10/13/2014  . Left  carotid bruit 02/14/2014  . Medication management 02/14/2014  . Essential hypertension 07/27/2013  . Hyperlipidemia associated with type 2 diabetes mellitus ([place]) 02/01/2013  . Vitamin D deficiency 02/01/2013  . BICUSPID AORTIC VALVE 07/03/2008  . Cerebrovascular disease 07/02/2008  . Asthma 07/02/2008  . GERD 07/02/2008  . Osteoarthritis 07/02/2008    Past Surgical History:  Procedure Laterality Date  . ABDOMINAL HYSTERECTOMY    . repair of trigger finger      OB History   No obstetric history on file.      Home Medications    Prior to Admission medications   Medication Sig Start Date End Date Taking? Authorizing Provider  albuterol (PROVENTIL HFA;VENTOLIN HFA) 108 (90 Base) MCG/ACT inhaler Use 1  To 2 inhalations 5 minutes apart every 4 hours if needed to rescue asthma 06/20/17 07/11/19  Liane Comber, NP  aspirin EC 81 MG tablet Take 81 mg by mouth daily.    [provider]  B Complex-C (SUPER B COMPLEX PO) Take 1 tablet by mouth daily. Takes every other day    [provider]  BAYER MICROLET LANCETS lancets Check blood sugar 1 time daily-DX-E11.9. 04/08/16   [name], MD  Cholecalciferol (VITAMIN D PO) Take 2 capsules by mouth daily. Take 4000 units by mouth twice a day    [provider]  Cinnamon 500 MG capsule Take 1,000 mg by mouth daily.     [provider]  cyclobenzaprine (FLEXERIL) 10 MG  tablet Take 1/2 to 1 tablet at Bedtime for Muscle Spasm 01/22/19   [name], MD  Diclofenac Sodium 1.5 % SOLN Place a small amount of gel on the bilateral hands for hand arthritis 02/19/16   Rolene Course, PA-C  diphenhydrAMINE (BENADRYL) 25 MG tablet Take 25 mg by mouth. Takes PRN at bedtime.    [provider]  famotidine (PEPCID) 40 MG tablet TAKE 1 TABLET BY MOUTH DAILY FOR ACID , INDIGESTION AND HEARTBURN 01/04/20   Liane Comber, NP  ferrous sulfate 325 (65 FE) MG tablet Take by mouth.    [provider]   fluconazole (DIFLUCAN) 150 MG tablet Use once for yeast infection as needed 06/28/18   [name], MD  furosemide (LASIX) 20 MG tablet Take by mouth. 03/19/16   [provider]  gabapentin (NEURONTIN) 100 MG capsule TAKE 1 TO 3 CAPSULES AT NIGHT 04/22/20   Liane Comber, NP  glucose blood (BAYER CONTOUR NEXT TEST) test strip Check blood sugar 1 time daily-DX-E11.9. 04/06/16   [name], MD  lisinopril-hydrochlorothiazide (ZESTORETIC) 10-12.5 MG tablet Take       1 tablet       Daily        for BP & Diabetic Kidney Protection 01/02/20   Garnet Sierras, NP  MAGNESIUM PO Take 2 tablets by mouth daily.    [provider]  meloxicam (MOBIC) 15 MG tablet TAKE ONE-HALF TO 1 TABLET BY MOUTH DAILY WITH FOOD FOR PAIN & INFLAMMATION - LIMIT TO MAXIMUM OF 5 DAYS /WEEK 06/19/20   Liane Comber, NP  metFORMIN (GLUCOPHAGE-XR) 500 MG 24 hr tablet TAKE 2 TABLETS BY MOUTH TWICE DAILY WITH MEALS. 01/17/20   Garnet Sierras, NP  metoprolol succinate (TOPROL-XL) 25 MG 24 hr tablet Take      1 tablet     Daily       for BP 01/02/20   McClanahan, Kyra, NP  mometasone (NASONEX) 50 MCG/ACT nasal spray Place 2 sprays into the nose daily. 06/30/18 07/11/19  [name], MD  Multiple Vitamin (MULITIVITAMIN WITH MINERALS) TABS Take 1 tablet by mouth daily.    [provider]  OVER THE COUNTER MEDICATION Takes Omega-Q vitamin which contains Tumeric.    [provider]  phentermine (ADIPEX-P) 37.5 MG tablet Take 1/2 to 1 tablet every morning as needed for dieting & weight loss 06/23/20   Liane Comber, NP  potassium chloride SA (KLOR-CON M20) 20 MEQ tablet TAKE 1 TABLET BY MOUTH 3 TIMES A DAY AFTER MEALS 08/18/19   Liane Comber, NP  Psyllium (METAMUCIL PO) Take 1 Dose by mouth daily.    [provider]  rOPINIRole (REQUIP) 0.5 MG tablet TAKE 1 TABLET 3 TIMES A DAY FOR RESTLESS LEGS 11/18/19   [name], MD  rosuvastatin (CRESTOR) 20 MG tablet TAKE ONE TABLET DAILY  FOR CHOLESTEROL 05/22/20   Liane Comber, NP  traMADol (ULTRAM) 50 MG tablet Take by mouth. Patient not taking: Reported on 04/22/2020    [provider]  triamcinolone cream (KENALOG) 0.1 % Apply 1 application topically 4 (four) times daily as needed. 01/25/17   [name], MD    Family History Family History  Problem Relation Age of Onset  . Coronary artery disease Mother   . Heart disease Mother   . Hypertension Mother   . Diabetes Father   . Hypertension Father   . Breast cancer Maternal Aunt     Social History Social History   Tobacco Use  . Smoking status: Never  Smoker  . Smokeless tobacco: Never Used  Substance Use Topics  . Alcohol use: Yes    Comment: very rarely  . Drug use: No     Allergies   Naprosyn [naproxen]   Review of Systems As stated in HPI otherwise negative   Physical Exam Triage Vital Signs ED Triage Vitals  Enc Vitals Group     BP 07/08/20 1757 (!) 135/52     Pulse Rate 07/08/20 1757 77     Resp --      Temp 07/08/20 1757 98.6 F (37 C)     Temp src --      SpO2 07/08/20 1757 100 %     Weight --      Height --      Head Circumference --      Peak Flow --      Pain Score 07/08/20 1754 0     Pain Loc --      Pain Edu? --      Excl. in [place]? --    No data found.  Updated Vital Signs BP (!) 135/52   Pulse 77   Temp 98.6 F (37 C)   SpO2 100%   Visual Acuity Right Eye Distance:   Left Eye Distance:   Bilateral Distance:    Right Eye Near:   Left Eye Near:    Bilateral Near:     Physical Exam Constitutional:      General: She is not in acute distress.    Appearance: Normal appearance. She is normal weight. She is not ill-appearing or toxic-appearing.  HENT:     Mouth/Throat:     Mouth: Mucous membranes are moist.  Cardiovascular:     Rate and Rhythm: Normal rate and regular rhythm.     Heart sounds: No murmur heard. No friction rub. No gallop.   Pulmonary:     Effort: Pulmonary effort is normal.      Breath sounds: Normal breath sounds. No wheezing, rhonchi or rales.  Abdominal:     General: Bowel sounds are normal.     Palpations: Abdomen is soft.     Tenderness: There is no abdominal tenderness. There is no guarding or rebound.  Musculoskeletal:        General: Normal range of motion.     Cervical back: Normal range of motion and neck supple. No rigidity.  Skin:    General: Skin is warm and dry.  Neurological:     General: No focal deficit present.     Mental Status: She is alert and oriented to person, place, and time.  Psychiatric:        Mood and Affect: Mood normal.        Behavior: Behavior normal.      UC Treatments / Results  Labs (all labs ordered are listed, but only abnormal results are displayed) Labs Reviewed - No data to display  EKG   Radiology No results found.  Procedures Procedures (including critical care time)  Medications Ordered in UC Medications - No data to display  Initial Impression / Assessment and Plan / UC Course  I have reviewed the triage vital signs and the nursing notes.  Pertinent labs & imaging results that were available during my care of the patient were reviewed by me and considered in my medical decision making (see chart for details).  Chest pain -EKG NSR at 70 bpm.  No change from recent EKG in ED -Midsternal and nonradiating.  Symptoms mostly atypical for  ACS but given multiple risk factors do not feel comfortable attributing symptoms to GI source without further work-up -No active pain upon my evaluation therefore no indication for GI cocktail -Patient instructed to go to emergency department for further evaluation and possible troponins -Patient verifies understanding  Reviewed expections re: course of current medical issues. Questions answered. Outlined signs and symptoms indicating need for more acute intervention. Pt verbalized understanding. AVS given  Final Clinical Impressions(s) / UC Diagnoses   Final  diagnoses:  Chest pain, unspecified type     Discharge Instructions     Given your past medical history and current symptoms, I think it is best to go to the emergency department for further evaluation.  Please go straight from urgent care to either [facility] or [facility].     ED Prescriptions    None     PDMP not reviewed this encounter.   Rudolpho Sevin, NP 07/08/20 1843

## 2020-07-08 NOTE — ED Triage Notes (Signed)
Pt reports CP started today and Diarrhea started a while ago. Pt reports 4 watery stools in last 24 hrs.

## 2020-07-08 NOTE — ED Notes (Signed)
Patient is being discharged from the Urgent Care and sent to the Emergency Department via POV . Per Mohammed Kindle, NP, patient is in need of higher level of care due to Chest Pain. Patient is aware and verbalizes understanding of plan of care.  Vitals:   07/08/20 1757  BP: (!) 135/52  Pulse: 77  Temp: 98.6 F (37 C)  SpO2: 100%

## 2020-07-08 NOTE — Discharge Instructions (Signed)
Given your past medical history and current symptoms, I think it is best to go to the emergency department for further evaluation.  Please go straight from urgent care to either [facility] or [facility].

## 2020-07-08 NOTE — ED Triage Notes (Addendum)
Pt states UC sent her for blood tests. Pt c/o chest pain, pt states she is having tightness in her chest that is uncomfortable. Chest pain started today at approx 1600. Pt states pain comes and goes. Pt denies pain at this moment.

## 2020-07-10 DIAGNOSIS — M79642 Pain in left hand: Secondary | ICD-10-CM | POA: Diagnosis not present

## 2020-07-10 DIAGNOSIS — M79641 Pain in right hand: Secondary | ICD-10-CM | POA: Diagnosis not present

## 2020-07-28 NOTE — Progress Notes (Signed)
Future Appointments  Date Time Provider [place]  07/29/2020  9:30 AM [name], MD [facility] None  01/22/2021 11:00 AM [name], MD [facility] None    History of Present Illness:       This very nice 65 y.o. MBF presents for 6  month follow up with HTN, HLD, Pre-Diabetes and Vitamin D Deficiency.        Patient is treated for HTN (1996)  & BP has been controlled at home. Today's BP is at goal - 132/70. Patient has had no complaints of any cardiac type chest pain, palpitations, dyspnea / orthopnea / PND, dizziness, claudication, or dependent edema.       Hyperlipidemia is controlled with diet & meds. Patient denies myalgias or other med SE's. Last Lipids were at goal:  Lab Results  Component Value Date   CHOL 91 04/22/2020   HDL 43 (L) 04/22/2020   LDLCALC 35 04/22/2020   TRIG 54 04/22/2020   CHOLHDL 2.1 04/22/2020     Also, the patient has history of T2_NIDDM  (2003) and has had no symptoms of reactive hypoglycemia, diabetic polys, paresthesias or visual blurring.  She admits not checking CBG's and last A1c was not at goal:  Lab Results  Component Value Date   HGBA1C 7.0 (H) 04/22/2020        Further, the patient also has history of Vitamin D Deficiency ("30" /2008) and supplements vitamin D without any suspected side-effects. Last vitamin D was near goal (70-100):   Lab Results  Component Value Date   VD25OH 57 01/08/2020     Current Outpatient Medications on File Prior to Visit  Medication Sig   aspirin EC 81 MG tablet Take  daily.   SUPER B COMPLEX Take 1 tablet every other day   VITAMIN D  Take 4000 units twice a day   Cinnamon 500 MG capsule Take 1,000 mg daily.    cyclobenzaprine  10 MG tablet Take 1/2 to 1 tablet at Bedtime for Muscle Spasm   Diclofenac Sodium 1.5 % SOLN Place a small amount on the hands for arthritis   diphenhydrAMINE 25 MG tablet Takes PRN at bedtime.   famotidine (PEPCID) 40 MG tablet TAKE 1 TABLET   DAILY    ferrous sulfate 325 (65 FE) MG tablet Take   fluconazole (DIFLUCAN) 150 MG tablet Use once for yeast infection as needed   furosemide 20 MG tablet Take    gabapentin 100 MG capsule TAKE 1 TO 3 CAPSULES AT NIGHT   lisinopril-hctz  10-12.5 MG tablet Take  1 tablet   Daily    MAGNESIUM PO Take 2 tablets by mouth daily.   meloxicam (MOBIC) 15 MG tablet TAKE ONE-HALF TO 1 TABLET DAILY    metFORMIN-XR  500 MG 24 hr tablet TAKE 2 TABLETS TWICE DAILY WITH MEALS.   metoprolol succinate -XL 25 MG  Take 1 tablet Daily     Multiple Vitamin w/minerals Take 1 tablet  daily.   Omega-Q vitamin which contains Tumeric. Takes    pantoprazole 20 MG tablet Take 1 tablet (20 mg total) by mouth daily.   phentermine 37.5 MG tablet Take 1/2 to 1 tablet every morning for dieting    potassium chloride SA 20 MEQ tablet TAKE 1 TABLET 3 TIMES A DAY A   METAMUCIL  Take 1 Dose daily.   rOPINIRole  0.5 MG tablet TAKE 1 TABLET 3 TIMES A DAY    rosuvastatin 20 MG tablet TAKE  ONE TABLET DAILY    traMADol (ULTRAM) 50 MG tablet Take by mouth.   triamcinolone cream (KENALOG) 0.1 % Apply 4 times daily as needed.   Albuterol  HFA  inhaler Use 1  To 2 inhalations apart every 4 hours if needed    NASONEX  nasal spray Place 2 sprays into the nose daily.     Allergies  Allergen Reactions   Naprosyn [Naproxen] Other (See Comments)    GI upset    PMHx:   Past Medical History:  Diagnosis Date   Allergic rhinitis    ASTHMA    BICUSPID AORTIC VALVE    CEREBROVASCULAR DISEASE    DEGENERATIVE JOINT DISEASE    Diabetes mellitus    DJD (degenerative joint disease)    GERD    HSV-1 (herpes simplex virus 1) infection    HYPERLIPIDEMIA    Hypertension    Palpitation    Vitamin D deficiency     Immunization History  Administered Date(s) Administered   DT  10/11/2014   DTaP 03/18/2004   Influenza Inj Mdck Quad  11/17/2016   Influenza  11/15/2013, 11/30/2014   Influenza  11/02/2012   Influenza,inj,Quad PF  10/21/2017, 09/30/2018   Influenza-Unspecified 10/31/2015, 10/21/2017, 11/27/2019   Moderna Sars-Covid-2 Vacc 04/15/2019, 05/19/2019   PFIZER SARS-COV-2 Vacc 01/01/2020   PPD Test 12/12/2017, 12/27/2018, 01/08/2020   Pneumococcal -13 11/15/2013, 11/17/2016   Pneumococcal -23 05/16/1996   Zoster Recombinat (Shingrix) 01/02/2018, 04/05/2018, 04/19/2018     Past Surgical History:  Procedure Laterality Date   ABDOMINAL HYSTERECTOMY     repair of trigger finger      FHx:    Reviewed / unchanged  SHx:    Reviewed / unchanged   Systems Review:  Constitutional: Denies fever, chills, wt changes, headaches, insomnia, fatigue, night sweats, change in appetite. Eyes: Denies redness, blurred vision, diplopia, discharge, itchy, watery eyes.  ENT: Denies discharge, congestion, post nasal drip, epistaxis, sore throat, earache, hearing loss, dental pain, tinnitus, vertigo, sinus pain, snoring.  CV: Denies chest pain, palpitations, irregular heartbeat, syncope, dyspnea, diaphoresis, orthopnea, PND, claudication or edema. Respiratory: denies cough, dyspnea, DOE, pleurisy, hoarseness, laryngitis, wheezing.  Gastrointestinal: Denies dysphagia, odynophagia, heartburn, reflux, water brash, abdominal pain or cramps, nausea, vomiting, bloating, diarrhea, constipation, hematemesis, melena, hematochezia  or hemorrhoids. Genitourinary: Denies dysuria, frequency, urgency, nocturia, hesitancy, discharge, hematuria or flank pain. Musculoskeletal: Denies arthralgias, myalgias, stiffness, jt. swelling, pain, limping or strain/sprain.  Skin: Denies pruritus, rash, hives, warts, acne, eczema or change in skin lesion(s). Neuro: No weakness, tremor, incoordination, spasms, paresthesia or pain. Psychiatric: Denies confusion, memory loss or sensory loss. Endo: Denies change in weight, skin or hair change.  Heme/Lymph: No excessive bleeding, bruising or enlarged lymph nodes.  Physical Exam  BP 132/70   Pulse 74    Temp (!) 97.5 F (36.4 C)   Resp 16   Ht 4\' 10"  (1.473 m)   Wt 119 lb (54 kg)   SpO2 96%   BMI 24.87 kg/m   Appears  well nourished, well groomed  and in no distress.  Eyes: PERRLA, Rt external esotropia, conjunctiva no swelling or erythema. Sinuses: No frontal/maxillary tenderness ENT/Mouth: EAC's clear, TM's nl w/o erythema, bulging. Nares clear w/o erythema, swelling, exudates. Oropharynx clear without erythema or exudates. Oral hygiene is good. Tongue normal, non obstructing. Hearing intact.  Neck: Supple. Thyroid not palpable. Car 2+/2+ without bruits, nodes or JVD. Chest: Respirations nl with BS clear & equal w/o rales, rhonchi, wheezing or stridor.  Cor: Heart sounds  normal w/ regular rate and rhythm without sig. murmurs, gallops, clicks or rubs. Peripheral pulses normal and equal  without edema.  Abdomen: Soft & bowel sounds normal. Non-tender w/o guarding, rebound, hernias, masses or organomegaly.  Lymphatics: Unremarkable.  Musculoskeletal: Full ROM all peripheral extremities, joint stability, 5/5 strength and normal gait.  Skin: Warm, dry without exposed rashes, lesions or ecchymosis apparent.  Neuro: Cranial nerves intact, reflexes equal bilaterally. Sensory-motor testing grossly intact. Tendon reflexes grossly intact.  Pysch: Alert & oriented x 3.  Insight and judgement nl & appropriate. No ideations.  Assessment and Plan:  1. Essential hypertension  - Continue medication, monitor blood pressure at home.  - Continue DASH diet.  Reminder to go to the ER if any CP,  SOB, nausea, dizziness, severe HA, changes vision/speech.  - CBC with Differential/Platelet - COMPLETE METABOLIC PANEL WITH GFR - Magnesium - TSH  2. Hyperlipidemia associated with type 2 diabetes mellitus ([place])  - Continue diet/meds, exercise,& lifestyle modifications.  - Continue monitor periodic cholesterol/liver & renal functions   - Lipid panel - TSH  3. Type 2 diabetes mellitus with stage 1  chronic kidney  disease, without long-term current use of insulin (HCC)  - Continue diet, exercise  - Lifestyle modifications.  - Monitor appropriate labs  - Hemoglobin A1c - Insulin, random  4. Vitamin D deficiency  - Continue supplementation  - VITAMIN D 25 Hydroxy  5. Medication management  - CBC with Differential/Platelet - COMPLETE METABOLIC PANEL WITH GFR - Magnesium - Lipid panel - TSH - Hemoglobin A1c - Insulin, random - VITAMIN D 25 Hydroxy        Discussed  regular exercise, BP monitoring, weight control to achieve/maintain BMI less than 25 and discussed med and SE's. Recommended labs to assess and monitor clinical status with further disposition pending results of labs.  I discussed the assessment and treatment plan with the patient. The patient was provided an opportunity to ask questions and all were answered. The patient agreed with the plan and demonstrated an understanding of the instructions.  I provided over 30 minutes of exam, counseling, chart review and  complex critical decision making.        The patient was advised to call back or seek an in-person evaluation if the symptoms worsen or if the condition fails to improve as anticipated.   Kirtland Bouchard, MD

## 2020-07-28 NOTE — Patient Instructions (Signed)

## 2020-07-29 ENCOUNTER — Ambulatory Visit: Payer: BC Managed Care – PPO | Admitting: Internal Medicine

## 2020-07-29 ENCOUNTER — Other Ambulatory Visit: Payer: Self-pay

## 2020-07-29 ENCOUNTER — Encounter: Payer: Self-pay | Admitting: Internal Medicine

## 2020-07-29 VITALS — BP 132/70 | HR 74 | Temp 97.5°F | Resp 16 | Ht <= 58 in | Wt 119.0 lb

## 2020-07-29 DIAGNOSIS — E785 Hyperlipidemia, unspecified: Secondary | ICD-10-CM | POA: Diagnosis not present

## 2020-07-29 DIAGNOSIS — E1169 Type 2 diabetes mellitus with other specified complication: Secondary | ICD-10-CM

## 2020-07-29 DIAGNOSIS — I10 Essential (primary) hypertension: Secondary | ICD-10-CM | POA: Diagnosis not present

## 2020-07-29 DIAGNOSIS — N181 Chronic kidney disease, stage 1: Secondary | ICD-10-CM

## 2020-07-29 DIAGNOSIS — E559 Vitamin D deficiency, unspecified: Secondary | ICD-10-CM

## 2020-07-29 DIAGNOSIS — E1122 Type 2 diabetes mellitus with diabetic chronic kidney disease: Secondary | ICD-10-CM

## 2020-07-29 DIAGNOSIS — Z79899 Other long term (current) drug therapy: Secondary | ICD-10-CM | POA: Diagnosis not present

## 2020-07-30 LAB — CBC WITH DIFFERENTIAL/PLATELET
Absolute Monocytes: 596 cells/uL (ref 200–950)
Basophils Absolute: 36 cells/uL (ref 0–200)
Basophils Relative: 0.4 %
Eosinophils Absolute: 1629 cells/uL — ABNORMAL HIGH (ref 15–500)
Eosinophils Relative: 18.3 %
HCT: 39.1 % (ref 35.0–45.0)
Hemoglobin: 12.5 g/dL (ref 11.7–15.5)
Lymphs Abs: 2118 cells/uL (ref 850–3900)
MCH: 30.9 pg (ref 27.0–33.0)
MCHC: 32 g/dL (ref 32.0–36.0)
MCV: 96.5 fL (ref 80.0–100.0)
MPV: 11.9 fL (ref 7.5–12.5)
Monocytes Relative: 6.7 %
Neutro Abs: 4521 cells/uL (ref 1500–7800)
Neutrophils Relative %: 50.8 %
Platelets: 263 10*3/uL (ref 140–400)
RBC: 4.05 10*6/uL (ref 3.80–5.10)
RDW: 12.8 % (ref 11.0–15.0)
Total Lymphocyte: 23.8 %
WBC: 8.9 10*3/uL (ref 3.8–10.8)

## 2020-07-30 LAB — COMPLETE METABOLIC PANEL WITH GFR
AG Ratio: 1.5 (calc) (ref 1.0–2.5)
ALT: 28 U/L (ref 6–29)
AST: 20 U/L (ref 10–35)
Albumin: 3.7 g/dL (ref 3.6–5.1)
Alkaline phosphatase (APISO): 57 U/L (ref 37–153)
BUN/Creatinine Ratio: 33 (calc) — ABNORMAL HIGH (ref 6–22)
BUN: 16 mg/dL (ref 7–25)
CO2: 30 mmol/L (ref 20–32)
Calcium: 9.4 mg/dL (ref 8.6–10.4)
Chloride: 102 mmol/L (ref 98–110)
Creat: 0.48 mg/dL — ABNORMAL LOW (ref 0.50–0.99)
GFR, Est African American: 120 mL/min/{1.73_m2} (ref >=60)
GFR, Est Non African American: 104 mL/min/{1.73_m2} (ref >=60)
Globulin: 2.5 g/dL (calc) (ref 1.9–3.7)
Glucose, Bld: 95 mg/dL (ref 65–99)
Potassium: 4.3 mmol/L (ref 3.5–5.3)
Sodium: 140 mmol/L (ref 135–146)
Total Bilirubin: 0.2 mg/dL (ref 0.2–1.2)
Total Protein: 6.2 g/dL (ref 6.1–8.1)

## 2020-07-30 LAB — VITAMIN D 25 HYDROXY (VIT D DEFICIENCY, FRACTURES): Vit D, 25-Hydroxy: 74 ng/mL (ref 30–100)

## 2020-07-30 LAB — MAGNESIUM: Magnesium: 1.9 mg/dL (ref 1.5–2.5)

## 2020-07-30 LAB — LIPID PANEL
Cholesterol: 91 mg/dL (ref <200)
HDL: 37 mg/dL — ABNORMAL LOW (ref >=50)
LDL Cholesterol (Calc): 42 mg/dL (calc)
Non-HDL Cholesterol (Calc): 54 mg/dL (calc) (ref <130)
Total CHOL/HDL Ratio: 2.5 (calc) (ref <5.0)
Triglycerides: 41 mg/dL (ref <150)

## 2020-07-30 LAB — HEMOGLOBIN A1C
Hgb A1c MFr Bld: 7.4 % of total Hgb — ABNORMAL HIGH (ref <5.7)
Mean Plasma Glucose: 166 mg/dL
eAG (mmol/L): 9.2 mmol/L

## 2020-07-30 LAB — INSULIN, RANDOM: Insulin: 3.8 u[IU]/mL

## 2020-07-30 LAB — TSH: TSH: 0.72 mIU/L (ref 0.40–4.50)

## 2020-07-30 NOTE — Progress Notes (Signed)
Pt was called to discuss her lab results. Pt states she will try to follow the doctor orders and she understood.

## 2020-07-30 NOTE — Progress Notes (Signed)
============================================================ ============================================================  -    Chol = 91  Excellent   - Very low risk for Heart Attack  / Stroke ============================================================ ============================================================  -  A1c is higher - gone up from 7.0% to now 7.4%          (  Ideal or Goal is less than 5.7%  !  )   - So need to work harder on diet     - Avoid Sweets, Candy & White Stuff   - White Rice, White Krupp, White Flour  - Breads &  Pasta ============================================================ ============================================================  -  Vitamin D = 74 - Excellent  ============================================================ ============================================================  -  All Else - CBC - Kidneys - Electrolytes - Liver - Magnesium & Thyroid    - all  Normal / OK ============================================================ ============================================================  -  Keep up the [place] work  !   ============================================================ ============================================================

## 2020-08-05 ENCOUNTER — Encounter: Payer: Self-pay | Admitting: Internal Medicine

## 2020-08-05 DIAGNOSIS — E119 Type 2 diabetes mellitus without complications: Secondary | ICD-10-CM | POA: Diagnosis not present

## 2020-08-05 DIAGNOSIS — H53001 Unspecified amblyopia, right eye: Secondary | ICD-10-CM | POA: Diagnosis not present

## 2020-08-05 DIAGNOSIS — H5010 Unspecified exotropia: Secondary | ICD-10-CM | POA: Diagnosis not present

## 2020-08-05 LAB — HM DIABETES EYE EXAM

## 2020-09-03 ENCOUNTER — Other Ambulatory Visit: Payer: Self-pay | Admitting: Adult Health Nurse Practitioner

## 2020-09-23 ENCOUNTER — Other Ambulatory Visit: Payer: Self-pay | Admitting: Internal Medicine

## 2020-09-23 MED ORDER — PANTOPRAZOLE SODIUM 40 MG PO TBEC: DELAYED_RELEASE_TABLET | ORAL | 3 refills | Status: DC

## 2020-10-10 ENCOUNTER — Other Ambulatory Visit: Payer: Self-pay | Admitting: Internal Medicine

## 2020-10-10 MED ORDER — ALBUTEROL SULFATE HFA 108 (90 BASE) MCG/ACT IN AERS: INHALATION_SPRAY | RESPIRATORY_TRACT | 3 refills | Status: DC

## 2020-10-22 ENCOUNTER — Other Ambulatory Visit: Payer: Self-pay

## 2020-10-22 DIAGNOSIS — E119 Type 2 diabetes mellitus without complications: Secondary | ICD-10-CM

## 2020-10-22 MED ORDER — METFORMIN HCL ER 500 MG PO TB24: ORAL_TABLET | ORAL | 3 refills | Status: DC

## 2020-10-31 NOTE — Progress Notes (Signed)
WELCOME TO MEDICARE WELLNESS VISIT AND FOLLOW UP  Assessment:   [name] was seen today for follow-up and medicare wellness.  Diagnoses and all orders for this visit:  Welcome to Medicare preventive visit  EKG AAA Screening Mammogram Screening DEXA  Essential hypertension -     CBC with Differential/Platelet -- continue medications, DASH diet, exercise and monitor at home. Call if greater than 130/80.    Hyperlipidemia associated with type 2 diabetes mellitus (HCC) -     COMPLETE METABOLIC PANEL WITH GFR -     Lipid panel -     TSH - Continue medication, diet and exercise  Type 2 diabetes mellitus with stage 1 chronic kidney disease, without long-term current use of insulin (HCC) -     Hemoglobin A1c -     Microalbumin / creatinine urine ratio -     Urinalysis, Routine w reflex microscopic - Continue diet, exercise and medication  Vitamin D deficiency -     VITAMIN D 25 Hydroxy (Vit-D Deficiency, Fractures) - Continue Vit D supplementation  Medication management  -Reviewed medication  Screening for AAA (aortic abdominal aneurysm) -     [place], RETROPERITNL ABD,  LTD  Screening for ischemic heart disease -     EKG 12-Lead  Gastroesophageal reflux disease, unspecified whether esophagitis present -     Magnesium - Continue Pepcid , Protonix and dietary changes  Neuropathy due to secondary diabetes (HCC) -     gabapentin (NEURONTIN) 100 MG capsule; 2 tabs at bedtime and 1 tab 2 times throughout day - Had been taking 1 bid of '100mg'$  but burning pain in hands and feet is increasing  Uncomplicated Asthma   Continue use of Albuterol inhaler as needed and monitor symptoms   Over 40 minutes of exam, counseling, chart review and critical decision making was performed Future Appointments  Date Time Provider [place]  02/12/2021 11:00 AM [name], MD [facility] None  11/03/2021 11:00 AM Magda Bernheim, NP [facility] None     Plan:   During the course of the  visit the patient was educated and counseled about appropriate screening and preventive services including:   Pneumococcal vaccine  Prevnar 13 Influenza vaccine Td vaccine Screening electrocardiogram Bone densitometry screening Colorectal cancer screening Diabetes screening Glaucoma screening Nutrition counseling  Advanced directives: requested   Subjective:  [name] is a 65 y.o. female who presents for Medicare Annual Wellness Visit and 3 month follow up.   Her blood pressure has been controlled at home, today their BP is   BP Readings from Last 3 Encounters:  07/29/20 132/70  07/08/20 (!) 144/71  07/08/20 (!) 135/52   She does workout. She denies chest pain, shortness of breath, dizziness.   She is on cholesterol medication and denies myalgias. Her cholesterol is at goal. The cholesterol last visit was:   Lab Results  Component Value Date   CHOL 91 07/29/2020   HDL 37 (L) 07/29/2020   LDLCALC 42 07/29/2020   TRIG 41 07/29/2020   CHOLHDL 2.5 07/29/2020    She has been working on diet and exercise for type 2 diabetes . Has started to get more parasthesias in hand and feet. Last A1C in the office was:  Lab Results  Component Value Date   HGBA1C 7.4 (H) 07/29/2020   Pt has noticed her asthma has had more flares, inhales relieves shortness of breath Last GFR:   Lab Results  Component Value Date   GFRAA 120 07/29/2020   Patient  is on Vitamin D supplement.   Lab Results  Component Value Date   VD25OH 74 07/29/2020      Medication Review: Current Outpatient Medications on File Prior to Visit  Medication Sig Dispense Refill   albuterol (VENTOLIN HFA) 108 (90 Base) MCG/ACT inhaler Use  1 to 2 inhalations  15 minutes  apart every 4 hours if needed to rescue asthma 48 g 3   aspirin EC 81 MG tablet Take 81 mg by mouth daily.     B Complex-C (SUPER B COMPLEX PO) Take 1 tablet by mouth daily. Takes every other day     Cholecalciferol (VITAMIN D PO) Take 2  capsules by mouth daily. Take 5000 units by mouth twice a day     Cinnamon 500 MG capsule Take 1,000 mg by mouth daily.      cyclobenzaprine (FLEXERIL) 10 MG tablet Take 1/2 to 1 tablet at Bedtime for Muscle Spasm 90 tablet 1   Diclofenac Sodium 1.5 % SOLN Place a small amount of gel on the bilateral hands for hand arthritis 150 mL 0   famotidine (PEPCID) 40 MG tablet TAKE 1 TABLET BY MOUTH DAILY FOR ACID , INDIGESTION AND HEARTBURN 90 tablet 3   ferrous sulfate 325 (65 FE) MG tablet Take by mouth.     fluconazole (DIFLUCAN) 150 MG tablet Use once for yeast infection as needed 12 tablet 1   furosemide (LASIX) 20 MG tablet Take by mouth.     lisinopril-hydrochlorothiazide (ZESTORETIC) 10-12.5 MG tablet TAKE 1 TABLET DAILY FOR BLOOD PRESSURE AND DIABETIC KIDNEY PROTECTION 90 tablet 3   MAGNESIUM PO Take 2 tablets by mouth daily.     meloxicam (MOBIC) 15 MG tablet TAKE ONE-HALF TO 1 TABLET BY MOUTH DAILY WITH FOOD FOR PAIN & INFLAMMATION - LIMIT TO MAXIMUM OF 5 DAYS /WEEK 60 tablet 3   metFORMIN (GLUCOPHAGE-XR) 500 MG 24 hr tablet TAKE 2 TABLETS BY MOUTH TWICE DAILY WITH MEALS. 360 tablet 3   metoprolol succinate (TOPROL-XL) 25 MG 24 hr tablet Take      1 tablet     Daily       for BP 90 tablet 3   Multiple Vitamin (MULITIVITAMIN WITH MINERALS) TABS Take 1 tablet by mouth daily.     OVER THE COUNTER MEDICATION Takes Omega-Q vitamin which contains Tumeric.     phentermine (ADIPEX-P) 37.5 MG tablet Take 1/2 to 1 tablet every morning as needed for dieting & weight loss 90 tablet 1   potassium chloride SA (KLOR-CON M20) 20 MEQ tablet TAKE 1 TABLET BY MOUTH 3 TIMES A DAY AFTER MEALS 270 tablet 3   Psyllium (METAMUCIL PO) Take 1 Dose by mouth daily.     rOPINIRole (REQUIP) 0.5 MG tablet TAKE 1 TABLET 3 TIMES A DAY FOR RESTLESS LEGS 270 tablet 3   rosuvastatin (CRESTOR) 20 MG tablet TAKE ONE TABLET DAILY FOR CHOLESTEROL 90 tablet 3   triamcinolone cream (KENALOG) 0.1 % Apply 1 application topically 4  (four) times daily as needed. 30 g 0   BAYER MICROLET LANCETS lancets Check blood sugar 1 time daily-DX-E11.9. (Patient not taking: Reported on 11/03/2020) 100 each 4   diphenhydrAMINE (BENADRYL) 25 MG tablet Take 25 mg by mouth. Takes PRN at bedtime. (Patient not taking: Reported on 11/03/2020)     glucose blood (BAYER CONTOUR NEXT TEST) test strip Check blood sugar 1 time daily-DX-E11.9. (Patient not taking: Reported on 11/03/2020) 100 each 4   mometasone (NASONEX) 50 MCG/ACT nasal spray Place 2 sprays  into the nose daily. 51 g 3   pantoprazole (PROTONIX) 40 MG tablet Take  1 tablet  Daily  to Prevent Indigestion & Heartburn 90 tablet 3   traMADol (ULTRAM) 50 MG tablet Take by mouth. (Patient not taking: Reported on 11/03/2020)     No current facility-administered medications on file prior to visit.    Allergies  Allergen Reactions   Naprosyn [Naproxen] Other (See Comments)    GI upset    Current Problems (verified) Patient Active Problem List   Diagnosis Date Noted   BMI 23.0-23.9, adult 04/21/2020   Degeneration of lumbar or lumbosacral intervertebral disc 10/10/2019   Type 2 diabetes with stage 1 chronic kidney disease GFR>90 ([place]) 04/22/2015   Esotropia of right eye 04/22/2015   Palpitations 04/11/2015   Abnormal electrocardiogram 04/11/2015   Sleep disorder, shift-work 10/13/2014   Left carotid bruit 02/14/2014   Medication management 02/14/2014   Essential hypertension 07/27/2013   Hyperlipidemia associated with type 2 diabetes mellitus ([place]) 02/01/2013   Vitamin D deficiency 02/01/2013   BICUSPID AORTIC VALVE 07/03/2008   Cerebrovascular disease 07/02/2008   Asthma 07/02/2008   GERD 07/02/2008   Osteoarthritis 07/02/2008    Screening Tests Immunization History  Administered Date(s) Administered   DT (Pediatric) 10/11/2014   DTaP 03/18/2004   Influenza Inj Mdck Quad With Preservative 11/17/2016   Influenza Split 11/15/2013, 11/30/2014   Influenza Whole 11/02/2012    Influenza,inj,Quad PF,6+ Mos 10/21/2017, 09/30/2018   Influenza-Unspecified 10/31/2015, 10/21/2017, 11/27/2019   Moderna SARS-COV2 Booster Vaccination 05/26/2020   Moderna Sars-Covid-2 Vaccination 04/15/2019, 05/19/2019   PFIZER(Purple Top)SARS-COV-2 Vaccination 01/01/2020   PPD Test 07/30/2013, 10/11/2014, 11/06/2015, 11/17/2016, 12/12/2017, 12/27/2018, 01/08/2020   Pneumococcal Conjugate-13 11/15/2013, 11/17/2016   Pneumococcal Polysaccharide-23 05/16/1996   Zoster Recombinat (Shingrix) 01/02/2018, 04/05/2018, 04/19/2018    Preventative care: Last colonoscopy: 2016 Dr. Earlean Shawl due 2026 Last mammogram: 03/01/19 negative , order today Last pap smear/pelvic exam: Due with next physical   DEXA:order today  Prior vaccinations: TD or Tdap: 2016  Influenza: 11/27/19 Pneumococcal: due for 23 Prevnar13: 2018 Shingles/Zostavax:11/19, 2/20, 3/20  Names of Other Physician/Practitioners you currently use: 1. [facility] here for primary care 2. Dr. Delman Cheadle, eye doctor, last visit 2022 3. Dr. Orene Desanctis, dentist, last visit 2022 Patient Care Team: [name], MD as PCP - General (Internal Medicine)  SURGICAL HISTORY She  has a past surgical history that includes repair of trigger finger and Abdominal hysterectomy. FAMILY HISTORY Her family history includes Breast cancer in her maternal aunt; Coronary artery disease in her mother; Diabetes in her father; Heart disease in her mother; Hypertension in her father and mother. SOCIAL HISTORY She  reports that she has never smoked. She has never used smokeless tobacco. She reports current alcohol use. She reports that she does not use drugs.   MEDICARE WELLNESS OBJECTIVES: Physical activity: Current Exercise Habits: The patient has a physically strenuous job, but has no regular exercise apart from work., Exercise limited by: neurologic condition(s) Cardiac risk factors: Cardiac Risk Factors include: diabetes  mellitus;hypertension;advanced age (>75mn, >>19women);dyslipidemia Depression/mood screen:   [facility] 11/03/2020  Decreased Interest 0  Down, Depressed, Hopeless 0  PHQ - 2 Score 0    ADLs:  In your present state of health, do you have any difficulty performing the following activities: 11/03/2020 07/28/2020  Hearing? N N  Vision? N N  Difficulty concentrating or making decisions? N N  Walking or climbing stairs? N N  Dressing or bathing? N N  Doing errands, shopping? N N  Some recent data might be hidden     Cognitive Testing  Alert? Yes  Normal Appearance?Yes  Oriented to person? Yes  Place? Yes   Time? Yes  Recall of three objects?  Yes  Can perform simple calculations? Yes  Displays appropriate judgment?Yes  Can read the correct time from a watch face?Yes  EOL planning: Does Patient Have a Medical Advance Directive?: No Would patient like information on creating a medical advance directive?: Yes (MAU/Ambulatory/Procedural Areas - Information given)  Review of Systems  Constitutional:  Negative for chills, fever and weight loss.  HENT:  Negative for congestion, hearing loss, sinus pain and sore throat.   Eyes:  Negative for blurred vision and double vision.  Respiratory:  Negative for cough and shortness of breath.   Cardiovascular:  Negative for chest pain, palpitations, orthopnea and leg swelling.  Gastrointestinal:  Negative for abdominal pain, constipation, diarrhea, heartburn, nausea and vomiting.  Musculoskeletal:  Negative for falls, joint pain and myalgias.  Skin:  Negative for rash.  Neurological:  Negative for dizziness, tingling, tremors, loss of consciousness and headaches.       Parasthesia of hands and feet  Psychiatric/Behavioral:  Negative for depression, memory loss and suicidal ideas.     Objective:     Today's Vitals   11/03/20 0925  Pulse: 75  Temp: (!) 97.5 F (36.4 C)  SpO2: 98%  Weight: 122 lb 12.8 oz (55.7 kg)   Body mass  index is 25.67 kg/m.  General appearance: alert, no distress, WD/WN, female HEENT: normocephalic, sclerae anicteric, Right eye gaze permanently deviatedTMs pearly, nares patent, no discharge or erythema, pharynx normal Oral cavity: MMM, no lesions Neck: supple, no lymphadenopathy, no thyromegaly, no masses Heart: RRR, normal S1, S2, no murmurs Lungs: CTA bilaterally, no wheezes, rhonchi, or rales Abdomen: +bs, soft, non tender, non distended, no masses, no hepatomegaly, no splenomegaly Musculoskeletal: nontender, no swelling, no obvious deformity Extremities: no edema, no cyanosis, no clubbing Pulses: 2+ symmetric, upper and lower extremities, normal cap refill Neurological: alert, oriented x 3, CN2-12 intact, strength normal upper extremities and lower extremities, sensation normal throughout, DTRs 2+ throughout, no cerebellar signs, gait normal Psychiatric: normal affect, behavior normal, pleasant  EKG: No ST changes AAA: <3cm   Medicare Attestation I have personally reviewed: The patient's medical and social history Their use of alcohol, tobacco or illicit drugs Their current medications and supplements, The patient's functional ability including ADLs,fall risks, home safety risks, cognitive, and hearing and visual impairment Diet and physical activities Evidence for depression or mood disorders  The patient's weight, height, BMI, and visual acuity have been recorded in the chart.  I have made referrals, counseling, and provided education to the patient based on review of the above and I have provided the patient with a written personalized care plan for preventive services.     Magda Bernheim, NP   11/03/2020

## 2020-11-03 ENCOUNTER — Encounter: Payer: Self-pay | Admitting: Nurse Practitioner

## 2020-11-03 ENCOUNTER — Ambulatory Visit (INDEPENDENT_AMBULATORY_CARE_PROVIDER_SITE_OTHER): Payer: Medicare HMO | Admitting: Nurse Practitioner

## 2020-11-03 ENCOUNTER — Other Ambulatory Visit: Payer: Self-pay

## 2020-11-03 VITALS — BP 142/80 | HR 75 | Temp 97.5°F | Wt 122.8 lb

## 2020-11-03 DIAGNOSIS — Z136 Encounter for screening for cardiovascular disorders: Secondary | ICD-10-CM

## 2020-11-03 DIAGNOSIS — Z1231 Encounter for screening mammogram for malignant neoplasm of breast: Secondary | ICD-10-CM

## 2020-11-03 DIAGNOSIS — R6889 Other general symptoms and signs: Secondary | ICD-10-CM | POA: Diagnosis not present

## 2020-11-03 DIAGNOSIS — E785 Hyperlipidemia, unspecified: Secondary | ICD-10-CM | POA: Diagnosis not present

## 2020-11-03 DIAGNOSIS — E1169 Type 2 diabetes mellitus with other specified complication: Secondary | ICD-10-CM

## 2020-11-03 DIAGNOSIS — N181 Chronic kidney disease, stage 1: Secondary | ICD-10-CM | POA: Diagnosis not present

## 2020-11-03 DIAGNOSIS — E559 Vitamin D deficiency, unspecified: Secondary | ICD-10-CM

## 2020-11-03 DIAGNOSIS — Z79899 Other long term (current) drug therapy: Secondary | ICD-10-CM

## 2020-11-03 DIAGNOSIS — I10 Essential (primary) hypertension: Secondary | ICD-10-CM

## 2020-11-03 DIAGNOSIS — E1340 Other specified diabetes mellitus with diabetic neuropathy, unspecified: Secondary | ICD-10-CM

## 2020-11-03 DIAGNOSIS — K219 Gastro-esophageal reflux disease without esophagitis: Secondary | ICD-10-CM

## 2020-11-03 DIAGNOSIS — E1122 Type 2 diabetes mellitus with diabetic chronic kidney disease: Secondary | ICD-10-CM | POA: Diagnosis not present

## 2020-11-03 DIAGNOSIS — Z0001 Encounter for general adult medical examination with abnormal findings: Secondary | ICD-10-CM | POA: Diagnosis not present

## 2020-11-03 DIAGNOSIS — Z0000 Encounter for general adult medical examination without abnormal findings: Secondary | ICD-10-CM

## 2020-11-03 DIAGNOSIS — J45909 Unspecified asthma, uncomplicated: Secondary | ICD-10-CM

## 2020-11-03 DIAGNOSIS — Z13820 Encounter for screening for osteoporosis: Secondary | ICD-10-CM

## 2020-11-03 MED ORDER — GABAPENTIN 100 MG PO CAPS: ORAL_CAPSULE | ORAL | 2 refills | Status: DC

## 2020-11-03 NOTE — Patient Instructions (Signed)

## 2020-11-04 LAB — CBC WITH DIFFERENTIAL/PLATELET
Absolute Monocytes: 476 cells/uL (ref 200–950)
Basophils Absolute: 43 cells/uL (ref 0–200)
Basophils Relative: 0.6 %
Eosinophils Absolute: 71 cells/uL (ref 15–500)
Eosinophils Relative: 1 %
HCT: 42.1 % (ref 35.0–45.0)
Hemoglobin: 13.6 g/dL (ref 11.7–15.5)
Lymphs Abs: 2024 cells/uL (ref 850–3900)
MCH: 30.7 pg (ref 27.0–33.0)
MCHC: 32.3 g/dL (ref 32.0–36.0)
MCV: 95 fL (ref 80.0–100.0)
MPV: 11.6 fL (ref 7.5–12.5)
Monocytes Relative: 6.7 %
Neutro Abs: 4487 cells/uL (ref 1500–7800)
Neutrophils Relative %: 63.2 %
Platelets: 270 10*3/uL (ref 140–400)
RBC: 4.43 10*6/uL (ref 3.80–5.10)
RDW: 11.8 % (ref 11.0–15.0)
Total Lymphocyte: 28.5 %
WBC: 7.1 10*3/uL (ref 3.8–10.8)

## 2020-11-04 LAB — COMPLETE METABOLIC PANEL WITH GFR
AG Ratio: 1.6 (calc) (ref 1.0–2.5)
ALT: 19 U/L (ref 6–29)
AST: 20 U/L (ref 10–35)
Albumin: 4.5 g/dL (ref 3.6–5.1)
Alkaline phosphatase (APISO): 67 U/L (ref 37–153)
BUN/Creatinine Ratio: 37 (calc) — ABNORMAL HIGH (ref 6–22)
BUN: 14 mg/dL (ref 7–25)
CO2: 32 mmol/L (ref 20–32)
Calcium: 9.8 mg/dL (ref 8.6–10.4)
Chloride: 101 mmol/L (ref 98–110)
Creat: 0.38 mg/dL — ABNORMAL LOW (ref 0.50–1.05)
Globulin: 2.8 g/dL (calc) (ref 1.9–3.7)
Glucose, Bld: 116 mg/dL — ABNORMAL HIGH (ref 65–99)
Potassium: 4.5 mmol/L (ref 3.5–5.3)
Sodium: 141 mmol/L (ref 135–146)
Total Bilirubin: 0.3 mg/dL (ref 0.2–1.2)
Total Protein: 7.3 g/dL (ref 6.1–8.1)
eGFR: 111 mL/min/{1.73_m2} (ref >=60)

## 2020-11-04 LAB — URINALYSIS, ROUTINE W REFLEX MICROSCOPIC
Bilirubin Urine: NEGATIVE
Glucose, UA: NEGATIVE
Hgb urine dipstick: NEGATIVE
Ketones, ur: NEGATIVE
Leukocytes,Ua: NEGATIVE
Nitrite: NEGATIVE
Protein, ur: NEGATIVE
Specific Gravity, Urine: 1.013 (ref 1.001–1.035)
pH: 8 (ref 5.0–8.0)

## 2020-11-04 LAB — HEMOGLOBIN A1C
Hgb A1c MFr Bld: 7.9 % of total Hgb — ABNORMAL HIGH (ref <5.7)
Mean Plasma Glucose: 180 mg/dL
eAG (mmol/L): 10 mmol/L

## 2020-11-04 LAB — LIPID PANEL
Cholesterol: 115 mg/dL (ref <200)
HDL: 57 mg/dL (ref >=50)
LDL Cholesterol (Calc): 47 mg/dL (calc)
Non-HDL Cholesterol (Calc): 58 mg/dL (calc) (ref <130)
Total CHOL/HDL Ratio: 2 (calc) (ref <5.0)
Triglycerides: 39 mg/dL (ref <150)

## 2020-11-04 LAB — MICROALBUMIN / CREATININE URINE RATIO
Creatinine, Urine: 50 mg/dL (ref 20–275)
Microalb, Ur: <0.2 mg/dL

## 2020-11-04 LAB — TSH: TSH: 1.15 mIU/L (ref 0.40–4.50)

## 2020-11-04 LAB — VITAMIN D 25 HYDROXY (VIT D DEFICIENCY, FRACTURES): Vit D, 25-Hydroxy: 64 ng/mL (ref 30–100)

## 2020-11-04 LAB — MAGNESIUM: Magnesium: 1.7 mg/dL (ref 1.5–2.5)

## 2020-11-13 ENCOUNTER — Other Ambulatory Visit: Payer: Self-pay | Admitting: Internal Medicine

## 2020-11-13 MED ORDER — DICLOFENAC POTASSIUM 50 MG PO TABS: ORAL_TABLET | ORAL | 1 refills | Status: DC

## 2020-11-17 ENCOUNTER — Other Ambulatory Visit: Payer: Self-pay | Admitting: Internal Medicine

## 2020-11-17 DIAGNOSIS — G8929 Other chronic pain: Secondary | ICD-10-CM

## 2020-11-17 DIAGNOSIS — I10 Essential (primary) hypertension: Secondary | ICD-10-CM

## 2020-11-17 DIAGNOSIS — E785 Hyperlipidemia, unspecified: Secondary | ICD-10-CM

## 2020-11-17 DIAGNOSIS — Z79899 Other long term (current) drug therapy: Secondary | ICD-10-CM

## 2020-11-17 DIAGNOSIS — E1169 Type 2 diabetes mellitus with other specified complication: Secondary | ICD-10-CM

## 2020-11-17 MED ORDER — METOPROLOL SUCCINATE ER 25 MG PO TB24: ORAL_TABLET | ORAL | 3 refills | Status: DC

## 2020-11-17 MED ORDER — ROSUVASTATIN CALCIUM 20 MG PO TABS: ORAL_TABLET | ORAL | 3 refills | Status: DC

## 2020-11-17 MED ORDER — CYCLOBENZAPRINE HCL 10 MG PO TABS: ORAL_TABLET | ORAL | 1 refills | Status: DC

## 2020-11-20 ENCOUNTER — Ambulatory Visit
Admission: RE | Admit: 2020-11-20 | Discharge: 2020-11-20 | Disposition: A | Discharge status: Home / Self Care | Payer: Medicare HMO | Source: Ambulatory Visit | Attending: Nurse Practitioner | Admitting: Nurse Practitioner

## 2020-11-20 ENCOUNTER — Other Ambulatory Visit: Payer: Self-pay

## 2020-11-20 DIAGNOSIS — Z1231 Encounter for screening mammogram for malignant neoplasm of breast: Secondary | ICD-10-CM

## 2020-11-20 DIAGNOSIS — Z780 Asymptomatic menopausal state: Secondary | ICD-10-CM | POA: Diagnosis not present

## 2020-11-20 DIAGNOSIS — Z13820 Encounter for screening for osteoporosis: Secondary | ICD-10-CM

## 2020-11-29 ENCOUNTER — Other Ambulatory Visit: Payer: Self-pay | Admitting: Internal Medicine

## 2020-11-29 DIAGNOSIS — M79604 Pain in right leg: Secondary | ICD-10-CM

## 2020-12-01 DIAGNOSIS — H18593 Other hereditary corneal dystrophies, bilateral: Secondary | ICD-10-CM | POA: Diagnosis not present

## 2020-12-01 DIAGNOSIS — H04123 Dry eye syndrome of bilateral lacrimal glands: Secondary | ICD-10-CM | POA: Diagnosis not present

## 2020-12-18 ENCOUNTER — Other Ambulatory Visit: Payer: Self-pay | Admitting: Internal Medicine

## 2020-12-22 ENCOUNTER — Encounter: Payer: Self-pay | Admitting: Internal Medicine

## 2021-01-01 ENCOUNTER — Other Ambulatory Visit: Payer: Self-pay | Admitting: Internal Medicine

## 2021-01-01 MED ORDER — MELOXICAM 15 MG PO TABS: ORAL_TABLET | ORAL | 0 refills | Status: DC

## 2021-01-15 ENCOUNTER — Other Ambulatory Visit: Payer: Self-pay

## 2021-01-15 MED ORDER — POTASSIUM CHLORIDE CRYS ER 20 MEQ PO TBCR: EXTENDED_RELEASE_TABLET | ORAL | 3 refills | Status: DC

## 2021-01-22 ENCOUNTER — Encounter: Payer: BC Managed Care – PPO | Admitting: Internal Medicine

## 2021-01-27 DIAGNOSIS — H903 Sensorineural hearing loss, bilateral: Secondary | ICD-10-CM | POA: Diagnosis not present

## 2021-01-27 DIAGNOSIS — H6122 Impacted cerumen, left ear: Secondary | ICD-10-CM | POA: Diagnosis not present

## 2021-01-27 DIAGNOSIS — H04123 Dry eye syndrome of bilateral lacrimal glands: Secondary | ICD-10-CM | POA: Diagnosis not present

## 2021-02-11 ENCOUNTER — Encounter: Payer: Self-pay | Admitting: Internal Medicine

## 2021-02-11 NOTE — Progress Notes (Signed)
Annual Screening/Preventative Visit & Comprehensive Evaluation &  Examination  Future Appointments  Date Time Provider Department  02/12/2021 11:00 AM [name], MD [facility]  11/03/2021 11:00 AM Magda Bernheim, NP [facility]  02/18/2022 11:00 AM [name], MD [facility]        This very nice 65 y.o. MBF  presents for a Screening /Preventative Visit & comprehensive evaluation and management of multiple medical co-morbidities.  Patient has been followed for HTN, HLD, T2_NIDDM  and Vitamin D Deficiency. Patient has hx/o GERD controlled with Doiet & Famotidine.         HTN predates circa 1996.   Patient's BP has been controlled at home and patient denies any cardiac symptoms as chest pain, palpitations, shortness of breath, dizziness or ankle swelling. Today's BP is at goal - 140/78.        Patient's hyperlipidemia is controlled with diet and medications. Patient denies myalgias or other medication SE's. Last lipids were  at goal :  Lab Results  Component Value Date   CHOL 115 11/03/2020   HDL 57 11/03/2020   LDLCALC 47 11/03/2020   TRIG 39 11/03/2020   CHOLHDL 2.0 11/03/2020         Patient has hx/o T2_NIDDM (2003) w/ CKD1  and patient denies reactive hypoglycemic symptoms, visual blurring, diabetic polys or paresthesias. Last A1c was   Lab Results  Component Value Date   HGBA1C 7.9 (H) 11/03/2020         Finally, patient has history of Vitamin D Deficiency ("30" /2008)  and last Vitamin D was   Lab Results  Component Value Date   VD25OH 64 11/03/2020     Current Outpatient Medications on File Prior to Visit  Medication Sig   meloxicam (MOBIC) 15 MG tablet Take  1/2 to 1 tablet  Daily  with Food for Pain & Inflammation   albuterol (VENTOLIN HFA) 108 (90 Base) MCG/ACT inhaler Use  1 to 2 inhalations  15 minutes  apart every 4 hours if needed to rescue asthma   aspirin EC 81 MG tablet Take 81 mg by mouth daily.   B Complex-C (SUPER B COMPLEX PO) Take 1  tablet by mouth daily. Takes every other day   Cholecalciferol (VITAMIN D PO) Take 2 capsules by mouth daily. Take 5000 units by mouth twice a day   Cinnamon 500 MG capsule Take 1,000 mg by mouth daily.    cyclobenzaprine (FLEXERIL) 10 MG tablet Take 1/2 to 1 tablet at Bedtime for Muscle Spasm   Diclofenac Sodium 1.5 % SOLN Place a small amount of gel on the bilateral hands for hand arthritis   famotidine (PEPCID) 40 MG tablet TAKE 1 TABLET BY MOUTH DAILY FOR ACID , INDIGESTION AND HEARTBURN   ferrous sulfate 325 (65 FE) MG tablet Take by mouth.   furosemide (LASIX) 20 MG tablet Take by mouth.   gabapentin (NEURONTIN) 100 MG capsule 2 tabs at bedtime and 1 tab 2 times throughout day   lisinopril-hydrochlorothiazide (ZESTORETIC) 10-12.5 MG tablet TAKE 1 TABLET DAILY FOR BLOOD PRESSURE AND DIABETIC KIDNEY PROTECTION   MAGNESIUM PO Take 2 tablets by mouth daily.   metFORMIN (GLUCOPHAGE-XR) 500 MG 24 hr tablet TAKE 2 TABLETS BY MOUTH TWICE DAILY WITH MEALS.   metoprolol succinate (TOPROL-XL) 25 MG 24 hr tablet Take  1 tablet  Daily  for BP   mometasone (NASONEX) 50 MCG/ACT nasal spray Place 2 sprays into the nose daily.   Multiple Vitamin (MULITIVITAMIN WITH MINERALS) TABS Take 1  tablet by mouth daily.   pantoprazole (PROTONIX) 40 MG tablet Take  1 tablet  Daily  to Prevent Indigestion & Heartburn   phentermine (ADIPEX-P) 37.5 MG tablet Take 1/2 to 1 tablet every morning as needed for dieting & weight loss   potassium chloride SA (KLOR-CON M20) 20 MEQ tablet TAKE 1 TABLET BY MOUTH 3 TIMES A DAY AFTER MEALS   Psyllium (METAMUCIL PO) Take 1 Dose by mouth daily.   rOPINIRole (REQUIP) 0.5 MG tablet TAKE 1 TABLET 3 TIMES A DAY FOR RESTLESS LEGS   rosuvastatin (CRESTOR) 20 MG tablet TAKE ONE TABLET DAILY FOR CHOLESTEROL   triamcinolone cream (KENALOG) 0.1 % Apply 1 application topically 4 (four) times daily as needed.     Allergies  Allergen Reactions   Naprosyn [Naproxen] Other (See Comments)     GI upset     Past Medical History:  Diagnosis Date   Allergic rhinitis    ASTHMA    BICUSPID AORTIC VALVE    CEREBROVASCULAR DISEASE    DEGENERATIVE JOINT DISEASE    Diabetes mellitus    DJD (degenerative joint disease)    GERD    HSV-1 (herpes simplex virus 1) infection    HYPERLIPIDEMIA    Hypertension    Palpitation    Vitamin D deficiency      Health Maintenance  Topic Date Due   Pneumonia Vaccine 42+ Years old (3 - PPSV23 if available, else PCV20) 11/17/2017   COVID-19 Vaccine (4 - Booster) 07/21/2020   INFLUENZA VACCINE  09/15/2020   PAP SMEAR-Modifier  11/03/2021 (Originally 07/27/2016)   DEXA SCAN  11/03/2021 (Originally 10/27/2020)   HEMOGLOBIN A1C  05/03/2021   OPHTHALMOLOGY EXAM  08/05/2021   FOOT EXAM  11/03/2021   MAMMOGRAM  11/21/2022   COLONOSCOPY 01/27/2025   Hepatitis C Screening  Completed   HIV Screening  Completed   Zoster Vaccines- Shingrix  Completed   HPV VACCINES  Aged Out     Immunization History  Administered Date(s) Administered   DT (Pediatric) 10/11/2014   DTaP 03/18/2004   Influenza Inj Mdck Quad  11/17/2016   Influenza Split 11/15/2013, 11/30/2014   Influenza Whole 11/02/2012   Influenza,inj,Quad PF,6+ Mos 10/21/2017, 09/30/2018   Influenza 10/31/2015, 10/21/2017, 11/27/2019   Moderna SARS-COV2 Booster  05/26/2020   Moderna Sars-Covid-2 Vacc 04/15/2019, 05/19/2019   PFIZER SARS-COV-2 Vacc 01/01/2020   PPD Test 12/12/2017, 12/27/2018, 01/08/2020   Pneumococcal-13 11/15/2013, 11/17/2016   Pneumococcal-23 05/16/1996   Zoster Recombinat (Shingrix) 01/02/2018, 04/05/2018, 04/19/2018    Last Colon -  01/28/2015 - Dr Earlean Shawl - Recc 10 yr f/u - due Jan 2027   Last MGM - 11/25/2020   Past Surgical History:  Procedure Laterality Date   ABDOMINAL HYSTERECTOMY     repair of trigger finger      Social History   Tobacco Use   Smoking status: Never   Smokeless tobacco: Never  Substance Use Topics   Alcohol use: Yes    Comment:  very rarely   Drug use: No      ROS Constitutional: Denies fever, chills, weight loss/gain, headaches, insomnia,  night sweats, and change in appetite. Does c/o fatigue. Eyes: Denies redness, blurred vision, diplopia, discharge, itchy, watery eyes.  ENT: Denies discharge, congestion, post nasal drip, epistaxis, sore throat, earache, hearing loss, dental pain, Tinnitus, Vertigo, Sinus pain, snoring.  Cardio: Denies chest pain, palpitations, irregular heartbeat, syncope, dyspnea, diaphoresis, orthopnea, PND, claudication, edema Respiratory: denies cough, dyspnea, DOE, pleurisy, hoarseness, laryngitis, wheezing.  Gastrointestinal: Denies dysphagia, heartburn,  reflux, water brash, pain, cramps, nausea, vomiting, bloating, diarrhea, constipation, hematemesis, melena, hematochezia, jaundice, hemorrhoids Genitourinary: Denies dysuria, frequency, urgency, nocturia, hesitancy, discharge, hematuria, flank pain Breast: Breast lumps, nipple discharge, bleeding.  Musculoskeletal: Denies arthralgia, myalgia, stiffness, Jt. Swelling, pain, limp, and strain/sprain. Denies falls. Skin: Denies puritis, rash, hives, warts, acne, eczema, changing in skin lesion Neuro: No weakness, tremor, incoordination, spasms, paresthesia, pain Psychiatric: Denies confusion, memory loss, sensory loss. Denies Depression. Endocrine: Denies change in weight, skin, hair change, nocturia, and paresthesia, diabetic polys, visual blurring, hyper / hypo glycemic episodes.  Heme/Lymph: No excessive bleeding, bruising, enlarged lymph nodes.  Physical Exam  BP 140/78    Pulse 100    Temp 97.9 F (36.6 C)    Resp 16    Ht 4\' 10"  (1.473 m)    Wt 123 lb 6.4 oz (56 kg)    SpO2 96%    BMI 25.79 kg/m   General Appearance: Well nourished, well groomed and in no apparent distress.  Eyes: PERRLA, EOMs, conjunctiva no swelling or erythema, normal fundi and vessels. Sinuses: No frontal/maxillary tenderness ENT/Mouth: EACs patent / TMs  nl.  Nares clear without erythema, swelling, mucoid exudates. Oral hygiene is good. No erythema, swelling, or exudate. Tongue normal, non-obstructing. Tonsils not swollen or erythematous. Hearing normal.  Neck: Supple, thyroid not palpable. No bruits, nodes or JVD. Respiratory: Respiratory effort normal.  BS equal and clear bilateral without rales, rhonci, wheezing or stridor. Cardio: Heart sounds are normal with regular rate and rhythm and no murmurs, rubs or gallops. Peripheral pulses are normal and equal bilaterally without edema. No aortic or femoral bruits. Chest: symmetric with normal excursions and percussion. Breasts: Symmetric, without lumps, nipple discharge, retractions, or fibrocystic changes.  Abdomen: Flat, soft with bowel sounds active. Nontender, no guarding, rebound, hernias, masses, or organomegaly.  Lymphatics: Non tender without lymphadenopathy.  Genitourinary:  Musculoskeletal: Full ROM all peripheral extremities, joint stability, 5/5 strength, and normal gait. Skin: Warm and dry without rashes, lesions, cyanosis, clubbing or  ecchymosis.  Neuro: Cranial nerves intact, reflexes equal bilaterally. Normal muscle tone, no cerebellar symptoms. Sensation intact.  Pysch: Alert and oriented X 3, normal affect, Insight and Judgment appropriate.    Assessment and Plan  1. Annual Preventative Screening Examination  2. Essential hypertension  - EKG 12-Lead - Urinalysis, Routine w reflex microscopic - Microalbumin / creatinine urine ratio - CBC with Differential/Platelet - COMPLETE METABOLIC PANEL WITH GFR - Magnesium - TSH  3. Hyperlipidemia associated with type 2 diabetes mellitus (HCC)  - EKG 12-Lead - TSH  4. Type 2 diabetes mellitus with stage 1 chronic kidney  disease, without long-term current use of insulin (HCC)  - EKG 12-Lead - PTH, intact and calcium - Hemoglobin A1c - Insulin, random  5. Vitamin D deficiency  - VITAMIN D 25 Hydroxy  6. Gastroesophageal  reflux disease  - CBC with Differential/Platelet  7. Screening for ischemic heart disease  - EKG 12-Lead  8. FHx: heart disease  - EKG 12-Lead  9. Neuropathy due to secondary diabetes (HCC)  - HM DIABETES FOOT EXAM - LOW EXTREMITY NEUR EXAM DOCUM  - gabapentin (NEURONTIN) 600 MG tablet;  Take 1/2 to 1 tablet 2 to 3 x /Daily as needed for Neuropathy Pain   Dispense: 270 tablet; Refill: 3  10. Medication management  - Urinalysis, Routine w reflex microscopic - Microalbumin / creatinine urine ratio - CBC with Differential/Platelet - COMPLETE METABOLIC PANEL WITH GFR - Magnesium - TSH - Hemoglobin A1c - Insulin, random -  VITAMIN D 25 Hydroxy          Patient was counseled in prudent diet to achieve/maintain BMI less than 25 for weight control, BP monitoring, regular exercise and medications. Discussed med's effects and SE's. Screening labs and tests as requested with regular follow-up as recommended. Over 40 minutes of exam, counseling, chart review and high complex critical decision making was performed.   Kirtland Bouchard, MD

## 2021-02-11 NOTE — Patient Instructions (Signed)

## 2021-02-12 ENCOUNTER — Other Ambulatory Visit: Payer: Self-pay

## 2021-02-12 ENCOUNTER — Ambulatory Visit (INDEPENDENT_AMBULATORY_CARE_PROVIDER_SITE_OTHER): Payer: Medicare HMO | Admitting: Internal Medicine

## 2021-02-12 ENCOUNTER — Encounter: Payer: Self-pay | Admitting: Internal Medicine

## 2021-02-12 VITALS — BP 140/78 | HR 100 | Temp 97.9°F | Resp 16 | Ht <= 58 in | Wt 123.4 lb

## 2021-02-12 DIAGNOSIS — E785 Hyperlipidemia, unspecified: Secondary | ICD-10-CM | POA: Diagnosis not present

## 2021-02-12 DIAGNOSIS — Z0000 Encounter for general adult medical examination without abnormal findings: Secondary | ICD-10-CM

## 2021-02-12 DIAGNOSIS — E1122 Type 2 diabetes mellitus with diabetic chronic kidney disease: Secondary | ICD-10-CM

## 2021-02-12 DIAGNOSIS — Z0001 Encounter for general adult medical examination with abnormal findings: Secondary | ICD-10-CM

## 2021-02-12 DIAGNOSIS — Z136 Encounter for screening for cardiovascular disorders: Secondary | ICD-10-CM

## 2021-02-12 DIAGNOSIS — E1140 Type 2 diabetes mellitus with diabetic neuropathy, unspecified: Secondary | ICD-10-CM | POA: Diagnosis not present

## 2021-02-12 DIAGNOSIS — Z8249 Family history of ischemic heart disease and other diseases of the circulatory system: Secondary | ICD-10-CM | POA: Diagnosis not present

## 2021-02-12 DIAGNOSIS — Z79899 Other long term (current) drug therapy: Secondary | ICD-10-CM | POA: Diagnosis not present

## 2021-02-12 DIAGNOSIS — E559 Vitamin D deficiency, unspecified: Secondary | ICD-10-CM | POA: Diagnosis not present

## 2021-02-12 DIAGNOSIS — E1169 Type 2 diabetes mellitus with other specified complication: Secondary | ICD-10-CM | POA: Diagnosis not present

## 2021-02-12 DIAGNOSIS — K219 Gastro-esophageal reflux disease without esophagitis: Secondary | ICD-10-CM

## 2021-02-12 DIAGNOSIS — I10 Essential (primary) hypertension: Secondary | ICD-10-CM

## 2021-02-12 DIAGNOSIS — N181 Chronic kidney disease, stage 1: Secondary | ICD-10-CM | POA: Diagnosis not present

## 2021-02-12 DIAGNOSIS — E1340 Other specified diabetes mellitus with diabetic neuropathy, unspecified: Secondary | ICD-10-CM

## 2021-02-12 MED ORDER — GABAPENTIN 600 MG PO TABS: ORAL_TABLET | ORAL | 1 refills | Status: DC

## 2021-02-12 MED ORDER — GABAPENTIN 600 MG PO TABS: ORAL_TABLET | ORAL | 3 refills | Status: DC

## 2021-02-13 LAB — URINALYSIS, ROUTINE W REFLEX MICROSCOPIC
Bilirubin Urine: NEGATIVE
Glucose, UA: NEGATIVE
Hgb urine dipstick: NEGATIVE
Ketones, ur: NEGATIVE
Leukocytes,Ua: NEGATIVE
Nitrite: NEGATIVE
Protein, ur: NEGATIVE
Specific Gravity, Urine: 1.015 (ref 1.001–1.035)
pH: 6.5 (ref 5.0–8.0)

## 2021-02-13 LAB — MICROALBUMIN / CREATININE URINE RATIO
Creatinine, Urine: 49 mg/dL (ref 20–275)
Microalb Creat Ratio: 12 mcg/mg creat (ref <30)
Microalb, Ur: 0.6 mg/dL

## 2021-02-13 LAB — COMPLETE METABOLIC PANEL WITH GFR
AG Ratio: 1.5 (calc) (ref 1.0–2.5)
ALT: 14 U/L (ref 6–29)
AST: 13 U/L (ref 10–35)
Albumin: 4.1 g/dL (ref 3.6–5.1)
Alkaline phosphatase (APISO): 71 U/L (ref 37–153)
BUN: 13 mg/dL (ref 7–25)
CO2: 32 mmol/L (ref 20–32)
Calcium: 9.3 mg/dL (ref 8.6–10.4)
Chloride: 99 mmol/L (ref 98–110)
Creat: 0.5 mg/dL (ref 0.50–1.05)
Globulin: 2.8 g/dL (calc) (ref 1.9–3.7)
Glucose, Bld: 229 mg/dL — ABNORMAL HIGH (ref 65–99)
Potassium: 4.3 mmol/L (ref 3.5–5.3)
Sodium: 138 mmol/L (ref 135–146)
Total Bilirubin: 0.2 mg/dL (ref 0.2–1.2)
Total Protein: 6.9 g/dL (ref 6.1–8.1)
eGFR: 104 mL/min/{1.73_m2} (ref >=60)

## 2021-02-13 LAB — CBC WITH DIFFERENTIAL/PLATELET
Absolute Monocytes: 410 cells/uL (ref 200–950)
Basophils Absolute: 59 cells/uL (ref 0–200)
Basophils Relative: 0.9 %
Eosinophils Absolute: 91 cells/uL (ref 15–500)
Eosinophils Relative: 1.4 %
HCT: 40 % (ref 35.0–45.0)
Hemoglobin: 12.7 g/dL (ref 11.7–15.5)
Lymphs Abs: 1944 cells/uL (ref 850–3900)
MCH: 30.8 pg (ref 27.0–33.0)
MCHC: 31.8 g/dL — ABNORMAL LOW (ref 32.0–36.0)
MCV: 97.1 fL (ref 80.0–100.0)
MPV: 11.5 fL (ref 7.5–12.5)
Monocytes Relative: 6.3 %
Neutro Abs: 3998 cells/uL (ref 1500–7800)
Neutrophils Relative %: 61.5 %
Platelets: 254 10*3/uL (ref 140–400)
RBC: 4.12 10*6/uL (ref 3.80–5.10)
RDW: 12.8 % (ref 11.0–15.0)
Total Lymphocyte: 29.9 %
WBC: 6.5 10*3/uL (ref 3.8–10.8)

## 2021-02-13 LAB — PTH, INTACT AND CALCIUM
Calcium: 9.3 mg/dL (ref 8.6–10.4)
PTH: 25 pg/mL (ref 16–77)

## 2021-02-13 LAB — TSH: TSH: 0.57 mIU/L (ref 0.40–4.50)

## 2021-02-13 LAB — INSULIN, RANDOM: Insulin: 25.5 u[IU]/mL — ABNORMAL HIGH

## 2021-02-13 LAB — HEMOGLOBIN A1C
Hgb A1c MFr Bld: 8.1 % of total Hgb — ABNORMAL HIGH (ref <5.7)
Mean Plasma Glucose: 186 mg/dL
eAG (mmol/L): 10.3 mmol/L

## 2021-02-13 LAB — MAGNESIUM: Magnesium: 1.6 mg/dL (ref 1.5–2.5)

## 2021-02-13 LAB — VITAMIN D 25 HYDROXY (VIT D DEFICIENCY, FRACTURES): Vit D, 25-Hydroxy: 82 ng/mL (ref 30–100)

## 2021-02-14 NOTE — Progress Notes (Signed)
============================================================ °-   Test results slightly outside the reference range are not unusual. If there is anything important, I will review this with you,  otherwise it is considered normal test values.  If you have further questions,  please do not hesitate to contact me at the office or via My Chart.  ============================================================ ============================================================  -  PTH - is a hormone that regulates calcium  balance & is Normal   -   Great  ! ============================================================ ============================================================  -  Glucose = 229 mg%  is very elevated  !                   ( Ideal or Goal is less than 120 mg %    !  )   - And     A1c = 8.1% is also STILL very Elevated       ( Deal or Goal is less than 5.7%    !  )  ============================================================ ============================================================  - Vitamin D = 82 -- Excellent - Please keep dose same  ============================================================ ============================================================

## 2021-02-17 DIAGNOSIS — H04123 Dry eye syndrome of bilateral lacrimal glands: Secondary | ICD-10-CM | POA: Diagnosis not present

## 2021-02-19 ENCOUNTER — Other Ambulatory Visit: Payer: Self-pay

## 2021-02-19 MED ORDER — LISINOPRIL-HYDROCHLOROTHIAZIDE 10-12.5 MG PO TABS: ORAL_TABLET | ORAL | 3 refills | Status: DC

## 2021-02-26 DIAGNOSIS — M79642 Pain in left hand: Secondary | ICD-10-CM | POA: Diagnosis not present

## 2021-02-26 DIAGNOSIS — M79641 Pain in right hand: Secondary | ICD-10-CM | POA: Diagnosis not present

## 2021-03-16 ENCOUNTER — Other Ambulatory Visit: Payer: Self-pay | Admitting: Internal Medicine

## 2021-03-16 ENCOUNTER — Other Ambulatory Visit: Payer: Self-pay | Admitting: Nurse Practitioner

## 2021-03-16 DIAGNOSIS — M5432 Sciatica, left side: Secondary | ICD-10-CM

## 2021-03-16 MED ORDER — MELOXICAM 15 MG PO TABS: ORAL_TABLET | ORAL | 0 refills | Status: DC

## 2021-03-27 ENCOUNTER — Other Ambulatory Visit: Payer: Self-pay | Admitting: Adult Health

## 2021-03-27 DIAGNOSIS — G4726 Circadian rhythm sleep disorder, shift work type: Secondary | ICD-10-CM

## 2021-04-14 ENCOUNTER — Other Ambulatory Visit: Payer: Self-pay | Admitting: Adult Health

## 2021-04-14 DIAGNOSIS — G4726 Circadian rhythm sleep disorder, shift work type: Secondary | ICD-10-CM

## 2021-04-20 ENCOUNTER — Other Ambulatory Visit: Payer: Self-pay | Admitting: Internal Medicine

## 2021-04-20 DIAGNOSIS — M545 Low back pain, unspecified: Secondary | ICD-10-CM

## 2021-04-20 DIAGNOSIS — G8929 Other chronic pain: Secondary | ICD-10-CM

## 2021-04-25 ENCOUNTER — Other Ambulatory Visit: Payer: Self-pay | Admitting: Adult Health

## 2021-04-25 DIAGNOSIS — G4726 Circadian rhythm sleep disorder, shift work type: Secondary | ICD-10-CM

## 2021-06-02 ENCOUNTER — Ambulatory Visit: Payer: Medicare HMO | Admitting: Adult Health

## 2021-06-02 NOTE — Progress Notes (Deleted)
FOLLOW UP  Assessment and Plan:   Hypertension Well controlled with current medications  Monitor blood pressure at home; patient to call if consistently greater than 130/80 Continue DASH diet.   Reminder to go to the ER if any CP, SOB, nausea, dizziness, severe HA, changes vision/speech, left arm numbness and tingling and jaw pain.  Cholesterol Currently at goal of LDL <70; continue crestor 20 mg daily Continue low cholesterol diet and exercise.  Check lipid panel.   Diabetes with diabetic chronic kidney disease Continue medication: metformin Continue diet and exercise.  Perform daily foot/skin check, notify office of any concerning changes.  Check A1C Not checking sugars at home - does have equipment, encouraged to check at least 1-2 x per week   CKD 1 associated with T2DM  Increase fluids, avoid NSAIDS, monitor sugars, will monitor  Vitamin D Def At goal at last visit; continue supplementation to maintain goal of 60-100  Vit D level  Insomnia/somnolensce due to shift work/RLS Continue trazodone, gabapentin as needed Continue requip for RLS unless improved with higher dose gabapentin  Continue diet and meds as discussed. Further disposition pending results of labs. Discussed med's effects and SE's.   Over 30 minutes of exam, counseling, chart review, and critical decision making was performed.   Future Appointments  Date Time Provider [place]  06/02/2021  4:00 PM Liane Comber, NP [facility] None  11/03/2021  4:00 PM Magda Bernheim, NP [facility] None  02/18/2022 11:00 AM [name], MD [facility] None    ----------------------------------------------------------------------------------------------------------------------  HPI 66 y.o. female  presents for 3 month follow up on hypertension, cholesterol, diabetes with CKD, weight and vitamin D deficiency.   She has intermittent lumbar pain and L extremity pain (posterior thigh, calf, aching). Following  with Dr. Rip Harbour at [place]. Had lumbar xrays and MRI that showed lumbar degenerative disc disease. Was recommended PT but never started due to cost concerns. She is taking meloxicam every other day to manage.    she is prescribed phentermine for weight loss and for somnolence due to shift work, she reports taking 1/2 tab prior to work 5 days week, doesn't take on weekends.   She works from [employer]- 3-5:30 AM, taking the trazodone 150 1/2 at night that is helping. melatonin, taking gabapentin 200 mg at HS and lower back pain and some aching in legs if sits for extended periods (follows with ortho). She does have a history of RLS, taking requip 10 mg TID which is helpful, She is on an iron supplement $RemoveBeforeD'65mg'[id number]$  daily.   BMI is There is no height or weight on file to calculate BMI., she is working on diet and exercise. Wt Readings from Last 3 Encounters:  02/12/21 123 lb 6.4 oz (56 kg)  11/03/20 122 lb 12.8 oz (55.7 kg)  07/29/20 119 lb (54 kg)   She is followed by Dr. Stanford Breed for cerebrovascular disease with most recent dopplers in 2017 showing mild disease only, with recommendation to continue ASA and statin.  Known asymptomatic bicuspid aortic valve; Echo on 02/22/2019 showed normal biventricular function, grade 2 diastolic dysfunction, no aortic regurgitation or stenosis.  She has not been checking her BP at home, today their BP is    She does not workout but works on her feet for long shifts. She denies chest pain, shortness of breath, dizziness.   She is on cholesterol medication on pravastatin (taking 1/2 tab 20 mg daily) and denies myalgias. Her cholesterol is at goal. The cholesterol last visit was:  Lab Results  Component Value Date   CHOL 115 11/03/2020   HDL 57 11/03/2020   LDLCALC 47 11/03/2020   TRIG 39 11/03/2020   CHOLHDL 2.0 11/03/2020    She has not been working on diet and exercise for T2 diabetes With hyperlipidemia on pravastatin  CAD on ASA Stage 1 CKD She is on ACE On  metformin - takes 2000 mg daily  Cinnamon Could not tolerate invokana denies increased appetite, nausea, paresthesia of the feet, polydipsia, polyuria, visual disturbances and vomiting.  She reports she has not been checking her sugars despite being advised to do so. She denies any symptoms of hypoglycemia. She does have glucometer. Last A1C in the office was:  Lab Results  Component Value Date   HGBA1C 8.1 (H) 02/12/2021   She has GFR in CKD 1 stage, associated with T2DM monitored at this office:  Lab Results  Component Value Date   EGFR 104 02/12/2021   Patient is on Vitamin D supplement and at goal at the last check:    Lab Results  Component Value Date   VD25OH 82 02/12/2021        Current Medications:  Current Outpatient Medications on File Prior to Visit  Medication Sig   albuterol (VENTOLIN HFA) 108 (90 Base) MCG/ACT inhaler Use  1 to 2 inhalations  15 minutes  apart every 4 hours if needed to rescue asthma   aspirin EC 81 MG tablet Take 81 mg by mouth daily.   B Complex-C (SUPER B COMPLEX PO) Take 1 tablet by mouth daily. Takes every other day   Cholecalciferol (VITAMIN D PO) Take 2 capsules by mouth daily. Take 5000 units by mouth twice a day   Cinnamon 500 MG capsule Take 1,000 mg by mouth daily.    cyclobenzaprine (FLEXERIL) 10 MG tablet TAKE 1/2 TO 1 TABLET AT BEDTIME FOR MUSCLE SPASM   Diclofenac Sodium 1.5 % SOLN Place a small amount of gel on the bilateral hands for hand arthritis   famotidine (PEPCID) 40 MG tablet TAKE 1 TABLET BY MOUTH DAILY FOR ACID , INDIGESTION AND HEARTBURN   ferrous sulfate 325 (65 FE) MG tablet Take by mouth.   furosemide (LASIX) 20 MG tablet Take by mouth.   gabapentin (NEURONTIN) 600 MG tablet Take 1/2 to 1 tablet 2 to 3 x /Daily as needed for Neuropathy Pain   lisinopril-hydrochlorothiazide (ZESTORETIC) 10-12.5 MG tablet TAKE 1 TABLET DAILY FOR BLOOD PRESSURE AND DIABETIC KIDNEY PROTECTION   MAGNESIUM PO Take 2 tablets by mouth daily.    meloxicam (MOBIC) 15 MG tablet Take  1/2 to 1 tablet  Daily  with Food for Pain & Inflammation   metFORMIN (GLUCOPHAGE-XR) 500 MG 24 hr tablet TAKE 2 TABLETS BY MOUTH TWICE DAILY WITH MEALS.   metoprolol succinate (TOPROL-XL) 25 MG 24 hr tablet Take  1 tablet  Daily  for BP   mometasone (NASONEX) 50 MCG/ACT nasal spray Place 2 sprays into the nose daily.   Multiple Vitamin (MULITIVITAMIN WITH MINERALS) TABS Take 1 tablet by mouth daily.   pantoprazole (PROTONIX) 40 MG tablet Take  1 tablet  Daily  to Prevent Indigestion & Heartburn   phentermine (ADIPEX-P) 37.5 MG tablet TAKE 1/2 TO 1 (ONE-HALF TO ONE) TABLET BY MOUTH IN THE MORNING AS NEEDED FOR  DIETING  AND  WEIGHT  LOSS   potassium chloride SA (KLOR-CON M20) 20 MEQ tablet TAKE 1 TABLET BY MOUTH 3 TIMES A DAY AFTER MEALS   Psyllium (METAMUCIL PO) Take 1  Dose by mouth daily.   rOPINIRole (REQUIP) 0.5 MG tablet TAKE 1 TABLET 3 TIMES A DAY FOR RESTLESS LEGS   rosuvastatin (CRESTOR) 20 MG tablet TAKE ONE TABLET DAILY FOR CHOLESTEROL   triamcinolone cream (KENALOG) 0.1 % Apply 1 application topically 4 (four) times daily as needed.   No current facility-administered medications on file prior to visit.     Allergies:  Allergies  Allergen Reactions   Naprosyn [Naproxen] Other (See Comments)    GI upset     Medical History:  Past Medical History:  Diagnosis Date   Allergic rhinitis    ASTHMA    BICUSPID AORTIC VALVE    CEREBROVASCULAR DISEASE    DEGENERATIVE JOINT DISEASE    Diabetes mellitus    DJD (degenerative joint disease)    GERD    HSV-1 (herpes simplex virus 1) infection    HYPERLIPIDEMIA    Hypertension    Palpitation    Vitamin D deficiency    Family history- Reviewed and unchanged Social history- Reviewed and unchanged   Review of Systems: Review of Systems  Constitutional:  Positive for malaise/fatigue (daytime fatigue/somnolensce ). Negative for weight loss.  HENT:  Negative for hearing loss and tinnitus.    Eyes:  Negative for blurred vision and double vision.  Respiratory:  Negative for cough, shortness of breath and wheezing.   Cardiovascular:  Negative for chest pain, palpitations, orthopnea, claudication and leg swelling.  Gastrointestinal:  Negative for abdominal pain, blood in stool, constipation, diarrhea, heartburn, melena, nausea and vomiting.  Genitourinary: Negative.   Musculoskeletal:  Positive for back pain (intermittent lumbar with radicular sx, follows sports med). Negative for falls, joint pain, myalgias and neck pain.  Skin:  Negative for rash.  Neurological:  Negative for dizziness, tingling, sensory change, weakness and headaches.  Endo/Heme/Allergies:  Negative for polydipsia.  Psychiatric/Behavioral:  Negative for depression and substance abuse. The patient has insomnia. The patient is not nervous/anxious.   All other systems reviewed and are negative.  Physical Exam: There were no vitals taken for this visit. Wt Readings from Last 3 Encounters:  02/12/21 123 lb 6.4 oz (56 kg)  11/03/20 122 lb 12.8 oz (55.7 kg)  07/29/20 119 lb (54 kg)   General Appearance: Well nourished, in no apparent distress. Eyes: PERRLA, Right eye gaze permanently deviated, conjunctiva no swelling or erythema Sinuses: No Frontal/maxillary tenderness ENT/Mouth: Ext aud canals clear, TMs without erythema, bulging. No erythema, swelling, or exudate on post pharynx.  Tonsils not swollen or erythematous. Hearing normal.  Neck: Supple, thyroid normal.  Respiratory: Respiratory effort normal, BS equal bilaterally without rales, rhonchi, wheezing or stridor.  Cardio: RRR with no MRGs. Brisk peripheral pulses without edema.  Abdomen: Soft, + BS.  Non tender, no guarding, rebound, hernias, masses. Lymphatics: Non tender without lymphadenopathy.  Musculoskeletal: Full ROM, no laxity or crepitus to bilateral knees, 5/5 strength, Normal gait, neg straight leg raise.  Skin: Warm, dry without rashes, lesions,  ecchymosis.  Neuro: Cranial nerves intact. No cerebellar symptoms.  Psych: Awake and oriented X 3, normal affect, Insight and Judgment appropriate.    Izora Ribas, NP 8:09 AM [facility]

## 2021-06-10 NOTE — Progress Notes (Addendum)
?FOLLOW UP ? ?Assessment and Plan:  ? ?Hypertension ?Well controlled with current medications  ?Monitor blood pressure at home; patient to call if consistently greater than 130/80 ?Continue DASH diet.   ?Reminder to go to the ER if any CP, SOB, nausea, dizziness, severe HA, changes vision/speech, left arm numbness and tingling and jaw pain. ? ?Cholesterol ?Currently at goal of LDL <70; continue crestor 20 mg daily ?Continue low cholesterol diet and exercise.  ?Check lipid panel.  ? ?Diabetes with diabetic chronic kidney disease ?Continue medication: metformin ?Continue diet and exercise.  ?Perform daily foot/skin check, notify office of any concerning changes.  ?Check A1C ?Not checking sugars at home - strong preference to avoid ?Discussed cutting down on all liquid calories ?If A1C above goal plan to add jardiance and 4 week follow up ? ?CKD 1 associated with T2DM  ?Increase fluids, avoid NSAIDS, monitor sugars, will monitor ? ?Vitamin D Def ?At goal at last visit; continue supplementation to maintain goal of 60-100 ? Vit D level ? ?Insomnia/somnolensce due to shift work/RLS ?Continue trazodone, gabapentin as needed ?Continue requip for RLS unless improved with higher dose gabapentin ? ?Abdominal discomfort/GERD ?Check CBC, CMP/GFR, non-tender today  ?Suspect GERD flare from HPI ?- chronic NSAID use, unclear med status but suspect she has been off of all GERD meds; she will call back to clarify after review at home ?GERD lifestyle discussed; plan to restart protonix 40 mg and add carafate with close follow up in 4 weeks  ? ?Orders Placed This Encounter  ?Procedures  ? CBC with Differential/Platelet  ? COMPLETE METABOLIC PANEL WITH GFR  ? Magnesium  ? Lipid panel  ? TSH  ? Hemoglobin A1c  ? ? ? ?Continue diet and meds as discussed. Further disposition pending results of labs. Discussed med's effects and SE's.   ?Over 30 minutes of exam, counseling, chart review, and critical decision making was performed.   ? ?Future Appointments  ?Date Time Provider [place]  ?11/03/2021  4:00 PM Mull, Townsend Roger, NP [facility] None  ?02/18/2022 11:00 AM [name], MD [facility] None  ? ? ?---------------------------------------------------------------------------------------------------------------------- ? ?HPI ?66 y.o. female  presents for 3 month follow up on hypertension, cholesterol, diabetes with CKD, weight and vitamin D deficiency.  ? ?She has had bil eye infections, reports has been seen by Dr. Dan Humphreys., was given eye drops, did improve infection sx, but has noted persistent/progressive vision decline, has had increased glare at night, has upcoming appointment with Dr. Katy Fitch.  ? ?She has intermittent lumbar pain and L extremity pain (posterior thigh, calf, aching). Following with Dr. Rip Harbour at [place]. Had lumbar xrays and MRI that showed lumbar degenerative disc disease. Was recommended PT but never started due to cost concerns, has been taking meloxicam/tylenol/ flexeril and notes pain is much improved.  ? ?she is prescribed phentermine for weight loss and for somnolence due to shift work, she reports taking 1 tab prior to work 5 days week, doesn't take on weekends.  ? ?She works from [employer]- 3-5:30 AM, taking the trazodone 150 1/2 at night that is helping. melatonin, taking gabapentin 200 mg at HS and lower back pain and some aching in legs if sits for extended periods (follows with ortho). She does have a history of RLS, taking requip 10 mg TID which is helpful, She is on an iron supplement 33m daily.  ? ?Today she reports has been having intermittent burning in her chest, typically during the day, also hoarseness, is prescribed famotidine 40 mg,  pantoprazole 40 mg but doesn't believe taking this. She endorses some upper abdominal pain this past weekend, has had increased belching, bloating, gassiness.  ? ?BMI is Body mass index is 26.13 kg/m?., she is working on diet and exercise. ?Wt Readings from  Last 3 Encounters:  ?06/11/21 125 lb (56.7 kg)  ?02/12/21 123 lb 6.4 oz (56 kg)  ?11/03/20 122 lb 12.8 oz (55.7 kg)  ? ?She is followed by Dr. Stanford Breed for cerebrovascular disease with most recent dopplers in 2017 showing mild disease only, with recommendation to continue ASA and statin.  Known asymptomatic bicuspid aortic valve; Echo on 02/22/2019 showed normal biventricular function, grade 2 diastolic dysfunction, no aortic regurgitation or stenosis. ? ?She has not been checking her BP at home, today their BP is BP: 130/72 ? She does not workout but works on her feet for long shifts. She denies chest pain, shortness of breath, dizziness. ? ? She is on cholesterol medication on rosuvastatin 20 mg daily and denies myalgias. Her cholesterol is at goal. The cholesterol last visit was:   ?Lab Results  ?Component Value Date  ? CHOL 115 11/03/2020  ? HDL 57 11/03/2020  ? [place] 47 11/03/2020  ? TRIG 39 11/03/2020  ? CHOLHDL 2.0 11/03/2020  ? ? She has not been working on diet and exercise for T2 diabetes ?With hyperlipidemia on rosuvastatin  ?CAD on ASA ?Stage 1 CKD She is on ACE ?On metformin - takes 2000 mg daily  ?Cinnamon ?Could not tolerate invokana ?denies increased appetite, nausea, paresthesia of the feet, polydipsia, polyuria, visual disturbances and vomiting.  ?She reports she has not been checking her sugars despite being advised to do so. She does reports has all supplies. Strongly dislikes sticking her finger.  ?Last A1C in the office was:  ?Lab Results  ?Component Value Date  ? HGBA1C 8.1 (H) 02/12/2021  ? ?She has GFR in CKD 1 stage, associated with T2DM monitored at this office:  ?Lab Results  ?Component Value Date  ? EGFR 104 02/12/2021  ? ?Patient is on Vitamin D supplement and at goal at the last check:    ?Lab Results  ?Component Value Date  ? VD25OH 82 02/12/2021  ?   ? ? ? ?Current Medications:  ?Current Outpatient Medications on File Prior to Visit  ?Medication Sig  ? albuterol (VENTOLIN HFA) 108  (90 Base) MCG/ACT inhaler Use  1 to 2 inhalations  15 minutes  apart every 4 hours if needed to rescue asthma  ? aspirin EC 81 MG tablet Take 81 mg by mouth daily.  ? B Complex-C (SUPER B COMPLEX PO) Take 1 tablet by mouth daily. Takes every other day  ? Cholecalciferol (VITAMIN D PO) Take 2 capsules by mouth daily. Take 5000 units by mouth twice a day  ? Cinnamon 500 MG capsule Take 1,000 mg by mouth daily.   ? cyclobenzaprine (FLEXERIL) 10 MG tablet TAKE 1/2 TO 1 TABLET AT BEDTIME FOR MUSCLE SPASM  ? Diclofenac Sodium 1.5 % SOLN Place a small amount of gel on the bilateral hands for hand arthritis  ? famotidine (PEPCID) 40 MG tablet TAKE 1 TABLET BY MOUTH DAILY FOR ACID , INDIGESTION AND HEARTBURN  ? ferrous sulfate 325 (65 FE) MG tablet Take by mouth.  ? furosemide (LASIX) 20 MG tablet Take by mouth.  ? gabapentin (NEURONTIN) 600 MG tablet Take 1/2 to 1 tablet 2 to 3 x /Daily as needed for Neuropathy Pain  ? lisinopril-hydrochlorothiazide (ZESTORETIC) 10-12.5 MG tablet TAKE 1 TABLET  DAILY FOR BLOOD PRESSURE AND DIABETIC KIDNEY PROTECTION  ? MAGNESIUM PO Take 2 tablets by mouth daily.  ? meloxicam (MOBIC) 15 MG tablet Take  1/2 to 1 tablet  Daily  with Food for Pain & Inflammation  ? metFORMIN (GLUCOPHAGE-XR) 500 MG 24 hr tablet TAKE 2 TABLETS BY MOUTH TWICE DAILY WITH MEALS.  ? metoprolol succinate (TOPROL-XL) 25 MG 24 hr tablet Take  1 tablet  Daily  for BP  ? mometasone (NASONEX) 50 MCG/ACT nasal spray Place 2 sprays into the nose daily. (Patient taking differently: Place 2 sprays into the nose as needed.)  ? Multiple Vitamin (MULITIVITAMIN WITH MINERALS) TABS Take 1 tablet by mouth daily.  ? phentermine (ADIPEX-P) 37.5 MG tablet TAKE 1/2 TO 1 (ONE-HALF TO ONE) TABLET BY MOUTH IN THE MORNING AS NEEDED FOR  DIETING  AND  WEIGHT  LOSS  ? potassium chloride SA (KLOR-CON M20) 20 MEQ tablet TAKE 1 TABLET BY MOUTH 3 TIMES A DAY AFTER MEALS  ? rOPINIRole (REQUIP) 0.5 MG tablet TAKE 1 TABLET 3 TIMES A DAY FOR RESTLESS  LEGS  ? rosuvastatin (CRESTOR) 20 MG tablet TAKE ONE TABLET DAILY FOR CHOLESTEROL  ? triamcinolone cream (KENALOG) 0.1 % Apply 1 application topically 4 (four) times daily as needed.  ? pantoprazole (PROTONIX) 85 M

## 2021-06-11 ENCOUNTER — Ambulatory Visit (INDEPENDENT_AMBULATORY_CARE_PROVIDER_SITE_OTHER): Payer: Medicare HMO | Admitting: Adult Health

## 2021-06-11 ENCOUNTER — Encounter: Payer: Self-pay | Admitting: Adult Health

## 2021-06-11 VITALS — BP 130/72 | HR 89 | Temp 97.5°F | Wt 125.0 lb

## 2021-06-11 DIAGNOSIS — Z79899 Other long term (current) drug therapy: Secondary | ICD-10-CM | POA: Diagnosis not present

## 2021-06-11 DIAGNOSIS — Z6823 Body mass index (BMI) 23.0-23.9, adult: Secondary | ICD-10-CM | POA: Diagnosis not present

## 2021-06-11 DIAGNOSIS — I10 Essential (primary) hypertension: Secondary | ICD-10-CM | POA: Diagnosis not present

## 2021-06-11 DIAGNOSIS — N181 Chronic kidney disease, stage 1: Secondary | ICD-10-CM | POA: Diagnosis not present

## 2021-06-11 DIAGNOSIS — E785 Hyperlipidemia, unspecified: Secondary | ICD-10-CM | POA: Diagnosis not present

## 2021-06-11 DIAGNOSIS — E1122 Type 2 diabetes mellitus with diabetic chronic kidney disease: Secondary | ICD-10-CM

## 2021-06-11 DIAGNOSIS — K219 Gastro-esophageal reflux disease without esophagitis: Secondary | ICD-10-CM | POA: Diagnosis not present

## 2021-06-11 DIAGNOSIS — E559 Vitamin D deficiency, unspecified: Secondary | ICD-10-CM | POA: Diagnosis not present

## 2021-06-11 DIAGNOSIS — E1169 Type 2 diabetes mellitus with other specified complication: Secondary | ICD-10-CM

## 2021-06-12 ENCOUNTER — Other Ambulatory Visit: Payer: Self-pay | Admitting: Adult Health

## 2021-06-12 DIAGNOSIS — E1122 Type 2 diabetes mellitus with diabetic chronic kidney disease: Secondary | ICD-10-CM

## 2021-06-12 LAB — CBC WITH DIFFERENTIAL/PLATELET
Absolute Monocytes: 570 cells/uL (ref 200–950)
Basophils Absolute: 43 cells/uL (ref 0–200)
Basophils Relative: 0.5 %
Eosinophils Absolute: 102 cells/uL (ref 15–500)
Eosinophils Relative: 1.2 %
HCT: 40.5 % (ref 35.0–45.0)
Hemoglobin: 13.1 g/dL (ref 11.7–15.5)
Lymphs Abs: 2389 cells/uL (ref 850–3900)
MCH: 31.4 pg (ref 27.0–33.0)
MCHC: 32.3 g/dL (ref 32.0–36.0)
MCV: 97.1 fL (ref 80.0–100.0)
MPV: 12 fL (ref 7.5–12.5)
Monocytes Relative: 6.7 %
Neutro Abs: 5398 cells/uL (ref 1500–7800)
Neutrophils Relative %: 63.5 %
Platelets: 254 10*3/uL (ref 140–400)
RBC: 4.17 10*6/uL (ref 3.80–5.10)
RDW: 12.1 % (ref 11.0–15.0)
Total Lymphocyte: 28.1 %
WBC: 8.5 10*3/uL (ref 3.8–10.8)

## 2021-06-12 LAB — MAGNESIUM: Magnesium: 1.7 mg/dL (ref 1.5–2.5)

## 2021-06-12 LAB — COMPLETE METABOLIC PANEL WITH GFR
AG Ratio: 1.8 (calc) (ref 1.0–2.5)
ALT: 18 U/L (ref 6–29)
AST: 17 U/L (ref 10–35)
Albumin: 4.4 g/dL (ref 3.6–5.1)
Alkaline phosphatase (APISO): 68 U/L (ref 37–153)
BUN: 16 mg/dL (ref 7–25)
CO2: 30 mmol/L (ref 20–32)
Calcium: 9.9 mg/dL (ref 8.6–10.4)
Chloride: 99 mmol/L (ref 98–110)
Creat: 0.66 mg/dL (ref 0.50–1.05)
Globulin: 2.5 g/dL (calc) (ref 1.9–3.7)
Glucose, Bld: 123 mg/dL — ABNORMAL HIGH (ref 65–99)
Potassium: 4.8 mmol/L (ref 3.5–5.3)
Sodium: 139 mmol/L (ref 135–146)
Total Bilirubin: 0.2 mg/dL (ref 0.2–1.2)
Total Protein: 6.9 g/dL (ref 6.1–8.1)
eGFR: 97 mL/min/{1.73_m2} (ref >=60)

## 2021-06-12 LAB — HEMOGLOBIN A1C
Hgb A1c MFr Bld: 8 % of total Hgb — ABNORMAL HIGH (ref <5.7)
Mean Plasma Glucose: 183 mg/dL
eAG (mmol/L): 10.1 mmol/L

## 2021-06-12 LAB — LIPID PANEL
Cholesterol: 97 mg/dL (ref <200)
HDL: 53 mg/dL (ref >=50)
LDL Cholesterol (Calc): 31 mg/dL (calc)
Non-HDL Cholesterol (Calc): 44 mg/dL (calc) (ref <130)
Total CHOL/HDL Ratio: 1.8 (calc) (ref <5.0)
Triglycerides: 46 mg/dL (ref <150)

## 2021-06-12 LAB — TSH: TSH: 1.39 mIU/L (ref 0.40–4.50)

## 2021-06-12 MED ORDER — SUCRALFATE 1 GM/10ML PO SUSP: ORAL | 1 refills | Status: DC

## 2021-06-12 MED ORDER — EMPAGLIFLOZIN 25 MG PO TABS: ORAL_TABLET | ORAL | 3 refills | Status: DC

## 2021-06-12 NOTE — Addendum Note (Signed)
Addended by: Izora Ribas on: 06/12/2021 07:56 AM ? ? Modules accepted: Orders ? ?

## 2021-06-15 ENCOUNTER — Telehealth: Payer: Self-pay | Admitting: Adult Health

## 2021-06-15 NOTE — Telephone Encounter (Signed)
Patient states that both meds that were sent in last week were too expensive even after insurance. Is there any alternatives? ?

## 2021-06-16 ENCOUNTER — Other Ambulatory Visit: Payer: Self-pay | Admitting: Adult Health

## 2021-06-16 MED ORDER — SUCRALFATE 1 G PO TABS: ORAL_TABLET | ORAL | 1 refills | Status: DC

## 2021-06-16 NOTE — Progress Notes (Signed)
LMOM FOR PATIENT TO CONTACT THE OFFICE FOR LAB RESULTS. [name] [facility]

## 2021-06-18 ENCOUNTER — Telehealth: Payer: Self-pay | Admitting: Adult Health

## 2021-06-18 NOTE — Telephone Encounter (Signed)
[facility] said with her insurance she would have to pay $135 for 90 day supply of Jardiance, she cant afford to do that. I looked at single care and [facility] and they were more expensive. She was wanting to know if there would be an alternative medication that was more cost effective.  ?

## 2021-06-22 ENCOUNTER — Telehealth: Payer: Self-pay | Admitting: Adult Health

## 2021-06-22 NOTE — Telephone Encounter (Addendum)
Pt spoke to insurance company and these are the diabetic medications they will cover: ? ?Invokana: $45 FOR 30 DAYS & $95 FOR 90 DAYS ? ?Farxiga : $65 FOR 30 DAYS & $200 FOR 90 DAYS ? ?She was wanting to know is it possible to take it every 2 days to make the medication last longer because the cost is just too high for her. She is also willing to explore other options  ?

## 2021-06-24 DIAGNOSIS — E119 Type 2 diabetes mellitus without complications: Secondary | ICD-10-CM | POA: Diagnosis not present

## 2021-06-24 DIAGNOSIS — H1789 Other corneal scars and opacities: Secondary | ICD-10-CM | POA: Diagnosis not present

## 2021-06-24 DIAGNOSIS — H524 Presbyopia: Secondary | ICD-10-CM | POA: Diagnosis not present

## 2021-06-24 DIAGNOSIS — H04123 Dry eye syndrome of bilateral lacrimal glands: Secondary | ICD-10-CM | POA: Diagnosis not present

## 2021-06-24 LAB — HM DIABETES EYE EXAM

## 2021-06-25 ENCOUNTER — Encounter: Payer: Self-pay | Admitting: Internal Medicine

## 2021-07-14 ENCOUNTER — Ambulatory Visit: Payer: Medicare HMO | Admitting: Adult Health

## 2021-07-31 ENCOUNTER — Encounter: Payer: Self-pay | Admitting: Adult Health

## 2021-07-31 ENCOUNTER — Ambulatory Visit (INDEPENDENT_AMBULATORY_CARE_PROVIDER_SITE_OTHER): Payer: Medicare HMO | Admitting: Adult Health

## 2021-07-31 ENCOUNTER — Other Ambulatory Visit: Payer: Self-pay | Admitting: Adult Health

## 2021-07-31 VITALS — BP 130/64 | HR 78 | Temp 97.7°F | Wt 125.0 lb

## 2021-07-31 DIAGNOSIS — N181 Chronic kidney disease, stage 1: Secondary | ICD-10-CM | POA: Diagnosis not present

## 2021-07-31 DIAGNOSIS — G4726 Circadian rhythm sleep disorder, shift work type: Secondary | ICD-10-CM

## 2021-07-31 DIAGNOSIS — E1165 Type 2 diabetes mellitus with hyperglycemia: Secondary | ICD-10-CM

## 2021-07-31 DIAGNOSIS — E1122 Type 2 diabetes mellitus with diabetic chronic kidney disease: Secondary | ICD-10-CM | POA: Diagnosis not present

## 2021-07-31 DIAGNOSIS — K219 Gastro-esophageal reflux disease without esophagitis: Secondary | ICD-10-CM | POA: Diagnosis not present

## 2021-07-31 DIAGNOSIS — E663 Overweight: Secondary | ICD-10-CM

## 2021-07-31 MED ORDER — PHENTERMINE HCL 37.5 MG PO TABS: ORAL_TABLET | ORAL | 0 refills | Status: DC

## 2021-07-31 NOTE — Progress Notes (Signed)
FOLLOW UP  Assessment and Plan:    Diabetes with diabetic chronic kidney disease Continue medication: metformin 2000 mg daily  Extended discussion about dietary options; high fiber, lower starch alternatives suggested, portions Not checking sugars at home - strong preference to avoid Cost limits SGLT2i, but she may qualify for j[facility]iance [facility] support based on income, given 1 month of samples 25 mg 1 tab daily, initiated support paperwork - Fructosamine  Abdominal discomfort/GERD Improved with protonix/carafate, can stop carafate now - chronic NSAID use, continue PPI, taper when able GERD lifestyle reminded  Orders Placed This Encounter  Procedures   Fructosamine    Continue diet and meds as discussed. Further disposition pending results of labs. Discussed med's effects and SE's.   Over 30 minutes of exam, counseling, chart review, and critical decision making was performed.   Future Appointments  Date Time Provider [place]  11/03/2021  4:00 PM Alycia Rossetti, NP [facility] None  02/18/2022 11:00 AM [name], MD [facility] None    ----------------------------------------------------------------------------------------------------------------------  HPI 66 y.o. female  presents for 1 month follow up on diabetes with CKD, abdominal pain/GERD.   Last OV was reporting intermittent burning in her chest, typically during the day, also hoarseness, 1 week of upper abdominal pain with belching, boating, gassiness, was prescribed famotidine 40 mg, pantoprazole 40 mg but but wasn't actually taking. Recommended she restart pantoprazole daily and added carafate. She reports much improved, sx resolved quickly.   BMI is Body mass index is 26.13 kg/m., she is working on diet and exercise. Wt Readings from Last 3 Encounters:  07/31/21 125 lb (56.7 kg)  06/11/21 125 lb (56.7 kg)  02/12/21 123 lb 6.4 oz (56 kg)    She has not been working on diet and exercise for T2  diabetes With hyperlipidemia on rosuvastatin  CAD on ASA Stage 1 CKD She is on ACE On metformin - takes 2000 mg daily  [place] Could not afford invokana ($95 for 90 days, farxiga $200 for 90 days), jardiance similar She is very resistant to sticking her finger, does have all supplies Admits didn't keep a food log -  denies increased appetite, nausea, paresthesia of the feet, polydipsia, polyuria, visual disturbances and vomiting.  Last A1C in the office was:  Lab Results  Component Value Date   HGBA1C 8.0 (H) 06/11/2021     Current Medications:  Current Outpatient Medications on File Prior to Visit  Medication Sig   albuterol (VENTOLIN HFA) 108 (90 Base) MCG/ACT inhaler Use  1 to 2 inhalations  15 minutes  apart every 4 hours if needed to rescue asthma   aspirin EC 81 MG tablet Take 81 mg by mouth daily.   B Complex-C (SUPER B COMPLEX PO) Take 1 tablet by mouth daily. Takes every other day   Cholecalciferol (VITAMIN D PO) Take 2 capsules by mouth daily. Take 5000 units by mouth twice a day   cyclobenzaprine (FLEXERIL) 10 MG tablet TAKE 1/2 TO 1 TABLET AT BEDTIME FOR MUSCLE SPASM   Diclofenac Sodium 1.5 % SOLN Place a small amount of gel on the bilateral hands for hand arthritis   ferrous sulfate 325 (65 FE) MG tablet Take by mouth.   furosemide (LASIX) 20 MG tablet Take by mouth.   gabapentin (NEURONTIN) 600 MG tablet Take 1/2 to 1 tablet 2 to 3 x /Daily as needed for Neuropathy Pain   lisinopril-hydrochlorothiazide (ZESTORETIC) 10-12.5 MG tablet TAKE 1 TABLET DAILY FOR BLOOD PRESSURE AND DIABETIC KIDNEY PROTECTION  meloxicam (MOBIC) 15 MG tablet Take  1/2 to 1 tablet  Daily  with Food for Pain & Inflammation   metFORMIN (GLUCOPHAGE-XR) 500 MG 24 hr tablet TAKE 2 TABLETS BY MOUTH TWICE DAILY WITH MEALS.   metoprolol succinate (TOPROL-XL) 25 MG 24 hr tablet Take  1 tablet  Daily  for BP   Multiple Vitamin (MULITIVITAMIN WITH MINERALS) TABS Take 1 tablet by mouth daily.    pantoprazole (PROTONIX) 40 MG tablet Take  1 tablet  Daily  to Prevent Indigestion & Heartburn   potassium chloride SA (KLOR-CON M20) 20 MEQ tablet TAKE 1 TABLET BY MOUTH 3 TIMES A DAY AFTER MEALS   Psyllium (METAMUCIL PO) Take 1 Dose by mouth daily.   rOPINIRole (REQUIP) 0.5 MG tablet TAKE 1 TABLET 3 TIMES A DAY FOR RESTLESS LEGS   rosuvastatin (CRESTOR) 20 MG tablet TAKE ONE TABLET DAILY FOR CHOLESTEROL   triamcinolone cream (KENALOG) 0.1 % Apply 1 application topically 4 (four) times daily as needed.   empagliflozin (JARDIANCE) 25 MG TABS tablet Take 1 tab prior to breakfast daily for diabetes. (Patient not taking: Reported on 07/31/2021)   mometasone (NASONEX) 50 MCG/ACT nasal spray Place 2 sprays into the nose daily. (Patient taking differently: Place 2 sprays into the nose as needed.)   No current facility-administered medications on file prior to visit.     Allergies:  Allergies  Allergen Reactions   Naprosyn [Naproxen] Other (See Comments)    GI upset     Medical History:  Past Medical History:  Diagnosis Date   Allergic rhinitis    ASTHMA    BICUSPID AORTIC VALVE    CEREBROVASCULAR DISEASE    DEGENERATIVE JOINT DISEASE    Diabetes mellitus    DJD (degenerative joint disease)    GERD    HSV-1 (herpes simplex virus 1) infection    HYPERLIPIDEMIA    Hypertension    Palpitation    Vitamin D deficiency    Family history- Reviewed and unchanged Social history- Reviewed and unchanged   Review of Systems: Review of Systems  Constitutional:  Negative for malaise/fatigue and weight loss.  HENT:  Negative for hearing loss and tinnitus.   Eyes:  Negative for blurred vision and double vision.  Respiratory:  Negative for cough, shortness of breath and wheezing.   Cardiovascular:  Negative for chest pain, palpitations, orthopnea, claudication and leg swelling.  Gastrointestinal:  Negative for abdominal pain, blood in stool, constipation, diarrhea, heartburn, melena, nausea  and vomiting.  Genitourinary: Negative.   Musculoskeletal:  Negative for joint pain and myalgias.  Skin:  Negative for rash.  Neurological:  Negative for dizziness, tingling, sensory change, weakness and headaches.  Endo/Heme/Allergies:  Negative for polydipsia.  Psychiatric/Behavioral: Negative.    All other systems reviewed and are negative.   Physical Exam: BP 130/64   Pulse 78   Temp 97.7 F (36.5 C)   Wt 125 lb (56.7 kg)   SpO2 98%   BMI 26.13 kg/m  Wt Readings from Last 3 Encounters:  07/31/21 125 lb (56.7 kg)  06/11/21 125 lb (56.7 kg)  02/12/21 123 lb 6.4 oz (56 kg)   General Appearance: Well nourished, in no apparent distress. Eyes: PERRLA, Right eye gaze permanently deviated, conjunctiva no swelling or erythema.  Sinuses: No Frontal/maxillary tenderness ENT/Mouth: Ext aud canals clear, TMs without erythema, bulging. No erythema, swelling, or exudate on post pharynx.  Tonsils not swollen or erythematous. Hearing normal.  Neck: Supple, thyroid normal.  Respiratory: Respiratory effort normal, BS equal  bilaterally without rales, rhonchi, wheezing or stridor.  Cardio: RRR with no MRGs. Brisk peripheral pulses without edema.  Abdomen: Soft, + BS.  Non tender, no guarding, rebound, hernias, masses. Lymphatics: Non tender without lymphadenopathy.  Musculoskeletal: Full ROM, no laxity or crepitus to bilateral knees, 5/5 strength, Normal gait, neg straight leg raise.  Skin: Warm, dry without rashes, lesions, ecchymosis.  Neuro: Cranial nerves intact. No cerebellar symptoms.  Psych: Awake and oriented X 3, normal affect, Insight and Judgment appropriate.    Izora Ribas, NP 10:51 AM [facility]

## 2021-07-31 NOTE — Patient Instructions (Addendum)
Can try tylenol PM or diphenhydramine (benadryl) at night for sleep instead of liquid nyquil    Rice - do smaller portions, could try brown, wild rice, or quinoa (other grain)  Could try lentil or other whole grain pasta   If doing a sandwich, could try doing an "open" sandwich, heavy on the fillings, limit to small portion of chips only occasionally and choose a low carb/high fiber/keto type bread.  (Look for lower total carb, higher fiber on the nutritional label)      High-Fiber Eating Plan Fiber, also called dietary fiber, is a type of carbohydrate. It is found foods such as fruits, vegetables, whole grains, and beans. A high-fiber diet can have many health benefits. Your health care provider may recommend a high-fiber diet to help: Prevent constipation. Fiber can make your bowel movements more regular. Lower your cholesterol. Relieve the following conditions: Inflammation of veins in the anus (hemorrhoids). Inflammation of specific areas of the digestive tract (uncomplicated diverticulosis). A problem of the large intestine, also called the colon, that sometimes causes pain and diarrhea (irritable bowel syndrome, or IBS). Prevent overeating as part of a weight-loss plan. Prevent heart disease, type 2 diabetes, and certain cancers. What are tips for following this plan? Reading food labels  Check the nutrition facts label on food products for the amount of dietary fiber. Choose foods that have 5 grams of fiber or more per serving. The goals for recommended daily fiber intake include: Men (age 69 or younger): 34-38 g. Men (over age 59): 28-34 g. Women (age 67 or younger): 25-28 g. Women (over age 42): 22-25 g. Your daily fiber goal is _____________ g. Shopping Choose whole fruits and vegetables instead of processed forms, such as apple juice or applesauce. Choose a wide variety of high-fiber foods such as avocados, lentils, oats, and kidney beans. Read the nutrition facts  label of the foods you choose. Be aware of foods with added fiber. These foods often have high sugar and sodium amounts per serving. Cooking Use whole-grain flour for baking and cooking. Cook with brown rice instead of white rice. Meal planning Start the day with a breakfast that is high in fiber, such as a cereal that contains 5 g of fiber or more per serving. Eat breads and cereals that are made with whole-grain flour instead of refined flour or white flour. Eat brown rice, bulgur wheat, or millet instead of white rice. Use beans in place of meat in soups, salads, and pasta dishes. Be sure that half of the grains you eat each day are whole grains. General information You can get the recommended daily intake of dietary fiber by: Eating a variety of fruits, vegetables, grains, nuts, and beans. Taking a fiber supplement if you are not able to take in enough fiber in your diet. It is better to get fiber through food than from a supplement. Gradually increase how much fiber you consume. If you increase your intake of dietary fiber too quickly, you may have bloating, cramping, or gas. Drink plenty of water to help you digest fiber. Choose high-fiber snacks, such as berries, raw vegetables, nuts, and popcorn. What foods should I eat? Fruits Berries. Pears. Apples. Oranges. Avocado. Prunes and raisins. Dried figs. Vegetables Sweet potatoes. Spinach. Kale. Artichokes. Cabbage. Broccoli. Cauliflower. Green peas. Carrots. Squash. Grains Whole-grain breads. Multigrain cereal. Oats and oatmeal. Brown rice. Barley. Bulgur wheat. [place]. Quinoa. Bran muffins. Popcorn. Rye wafer crackers. Meats and other proteins Navy beans, kidney beans, and pinto beans. Soybeans. Split  peas. Lentils. Nuts and seeds. Dairy Fiber-fortified yogurt. Beverages Fiber-fortified soy milk. Fiber-fortified orange juice. Other foods Fiber bars. The items listed above may not be a complete list of recommended foods and  beverages. Contact a dietitian for more information. What foods should I avoid? Fruits Fruit juice. Cooked, strained fruit. Vegetables Fried potatoes. Canned vegetables. Well-cooked vegetables. Grains White bread. Pasta made with refined flour. White rice. Meats and other proteins Fatty cuts of meat. Fried chicken or fried fish. Dairy Milk. Yogurt. Cream cheese. Sour cream. Fats and oils Butters. Beverages Soft drinks. Other foods Cakes and pastries. The items listed above may not be a complete list of foods and beverages to avoid. Talk with your dietitian about what choices are best for you. Summary Fiber is a type of carbohydrate. It is found in foods such as fruits, vegetables, whole grains, and beans. A high-fiber diet has many benefits. It can help to prevent constipation, lower blood cholesterol, aid weight loss, and reduce your risk of heart disease, diabetes, and certain cancers. Increase your intake of fiber gradually. Increasing fiber too quickly may cause cramping, bloating, and gas. Drink plenty of water while you increase the amount of fiber you consume. The best sources of fiber include whole fruits and vegetables, whole grains, nuts, seeds, and beans. This information is not intended to replace advice given to you by your health care provider. Make sure you discuss any questions you have with your health care provider. Document Revised: 06/07/2019 Document Reviewed: 06/07/2019 Elsevier Patient Education  [place].

## 2021-08-04 DIAGNOSIS — H02834 Dermatochalasis of left upper eyelid: Secondary | ICD-10-CM | POA: Diagnosis not present

## 2021-08-04 DIAGNOSIS — H0279 Other degenerative disorders of eyelid and periocular area: Secondary | ICD-10-CM | POA: Diagnosis not present

## 2021-08-04 DIAGNOSIS — H02831 Dermatochalasis of right upper eyelid: Secondary | ICD-10-CM | POA: Diagnosis not present

## 2021-08-04 DIAGNOSIS — G2581 Restless legs syndrome: Secondary | ICD-10-CM | POA: Insufficient documentation

## 2021-08-04 DIAGNOSIS — H04123 Dry eye syndrome of bilateral lacrimal glands: Secondary | ICD-10-CM | POA: Diagnosis not present

## 2021-08-04 DIAGNOSIS — H02413 Mechanical ptosis of bilateral eyelids: Secondary | ICD-10-CM | POA: Diagnosis not present

## 2021-08-04 DIAGNOSIS — H53483 Generalized contraction of visual field, bilateral: Secondary | ICD-10-CM | POA: Diagnosis not present

## 2021-08-04 DIAGNOSIS — H02423 Myogenic ptosis of bilateral eyelids: Secondary | ICD-10-CM | POA: Diagnosis not present

## 2021-08-04 DIAGNOSIS — H57813 Brow ptosis, bilateral: Secondary | ICD-10-CM | POA: Diagnosis not present

## 2021-08-04 DIAGNOSIS — I10 Essential (primary) hypertension: Secondary | ICD-10-CM | POA: Insufficient documentation

## 2021-08-04 DIAGNOSIS — E119 Type 2 diabetes mellitus without complications: Secondary | ICD-10-CM | POA: Insufficient documentation

## 2021-08-04 DIAGNOSIS — E7800 Pure hypercholesterolemia, unspecified: Secondary | ICD-10-CM | POA: Insufficient documentation

## 2021-08-04 LAB — FRUCTOSAMINE: Fructosamine: 269 umol/L (ref 205–285)

## 2021-08-11 ENCOUNTER — Telehealth: Payer: Self-pay

## 2021-08-24 DIAGNOSIS — H53483 Generalized contraction of visual field, bilateral: Secondary | ICD-10-CM | POA: Diagnosis not present

## 2021-08-26 ENCOUNTER — Other Ambulatory Visit: Payer: Self-pay | Admitting: Internal Medicine

## 2021-08-26 DIAGNOSIS — E119 Type 2 diabetes mellitus without complications: Secondary | ICD-10-CM

## 2021-08-26 DIAGNOSIS — I10 Essential (primary) hypertension: Secondary | ICD-10-CM

## 2021-08-26 DIAGNOSIS — M5432 Sciatica, left side: Secondary | ICD-10-CM

## 2021-08-26 DIAGNOSIS — E1169 Type 2 diabetes mellitus with other specified complication: Secondary | ICD-10-CM

## 2021-09-15 ENCOUNTER — Other Ambulatory Visit: Payer: Self-pay

## 2021-09-15 DIAGNOSIS — M79604 Pain in right leg: Secondary | ICD-10-CM

## 2021-09-15 MED ORDER — ROPINIROLE HCL 0.5 MG PO TABS: ORAL_TABLET | ORAL | 3 refills | Status: DC

## 2021-09-21 DIAGNOSIS — H02423 Myogenic ptosis of bilateral eyelids: Secondary | ICD-10-CM | POA: Diagnosis not present

## 2021-09-21 DIAGNOSIS — H53483 Generalized contraction of visual field, bilateral: Secondary | ICD-10-CM | POA: Diagnosis not present

## 2021-09-21 DIAGNOSIS — H57813 Brow ptosis, bilateral: Secondary | ICD-10-CM | POA: Diagnosis not present

## 2021-09-21 DIAGNOSIS — H02413 Mechanical ptosis of bilateral eyelids: Secondary | ICD-10-CM | POA: Diagnosis not present

## 2021-09-21 DIAGNOSIS — H0279 Other degenerative disorders of eyelid and periocular area: Secondary | ICD-10-CM | POA: Diagnosis not present

## 2021-09-21 DIAGNOSIS — H02834 Dermatochalasis of left upper eyelid: Secondary | ICD-10-CM | POA: Diagnosis not present

## 2021-09-21 DIAGNOSIS — H0014 Chalazion left upper eyelid: Secondary | ICD-10-CM | POA: Diagnosis not present

## 2021-09-21 DIAGNOSIS — H02831 Dermatochalasis of right upper eyelid: Secondary | ICD-10-CM | POA: Diagnosis not present

## 2021-09-21 DIAGNOSIS — H04123 Dry eye syndrome of bilateral lacrimal glands: Secondary | ICD-10-CM | POA: Diagnosis not present

## 2021-10-16 ENCOUNTER — Other Ambulatory Visit: Payer: Self-pay | Admitting: Internal Medicine

## 2021-10-21 ENCOUNTER — Other Ambulatory Visit: Payer: Self-pay | Admitting: Nurse Practitioner

## 2021-10-21 DIAGNOSIS — E663 Overweight: Secondary | ICD-10-CM

## 2021-10-21 DIAGNOSIS — E1165 Type 2 diabetes mellitus with hyperglycemia: Secondary | ICD-10-CM

## 2021-10-21 MED ORDER — PHENTERMINE HCL 37.5 MG PO TABS: ORAL_TABLET | ORAL | 0 refills | Status: DC

## 2021-10-27 DIAGNOSIS — H53483 Generalized contraction of visual field, bilateral: Secondary | ICD-10-CM | POA: Diagnosis not present

## 2021-10-27 DIAGNOSIS — H02423 Myogenic ptosis of bilateral eyelids: Secondary | ICD-10-CM | POA: Diagnosis not present

## 2021-10-27 DIAGNOSIS — H57813 Brow ptosis, bilateral: Secondary | ICD-10-CM | POA: Diagnosis not present

## 2021-10-27 DIAGNOSIS — D210 Benign neoplasm of connective and other soft tissue of head, face and neck: Secondary | ICD-10-CM | POA: Diagnosis not present

## 2021-10-27 DIAGNOSIS — H50111 Monocular exotropia, right eye: Secondary | ICD-10-CM | POA: Diagnosis not present

## 2021-10-27 DIAGNOSIS — H02413 Mechanical ptosis of bilateral eyelids: Secondary | ICD-10-CM | POA: Diagnosis not present

## 2021-10-27 DIAGNOSIS — H02834 Dermatochalasis of left upper eyelid: Secondary | ICD-10-CM | POA: Diagnosis not present

## 2021-10-27 DIAGNOSIS — H0014 Chalazion left upper eyelid: Secondary | ICD-10-CM | POA: Diagnosis not present

## 2021-10-27 DIAGNOSIS — H02831 Dermatochalasis of right upper eyelid: Secondary | ICD-10-CM | POA: Diagnosis not present

## 2021-11-02 NOTE — Progress Notes (Deleted)
ANNUAL MEDICARE WELLNESS VISIT AND FOLLOW UP  Assessment:   [name] was seen today for follow-up and medicare wellness.  Diagnoses and all orders for this visit:   Encounter for Medicare Annual Wellness Due Yearly  Essential hypertension -     CBC with Differential/Platelet -- continue medications, DASH diet, exercise and monitor at home. Call if greater than 130/80.    Hyperlipidemia associated with type 2 diabetes mellitus (HCC) -     COMPLETE METABOLIC PANEL WITH GFR -     Lipid panel -     TSH - Continue medication, diet and exercise  Type 2 diabetes mellitus with stage 1 chronic kidney disease, without long-term current use of insulin (HCC) -     Hemoglobin A1c -     Microalbumin / creatinine urine ratio -     Urinalysis, Routine w reflex microscopic - Continue diet, exercise and medication  Vitamin D deficiency -     VITAMIN D 25 Hydroxy (Vit-D Deficiency, Fractures) - Continue Vit D supplementation  Medication management  -Reviewed medication   Gastroesophageal reflux disease, unspecified whether esophagitis present -     Magnesium - Continue Pepcid , Protonix and dietary changes  Neuropathy due to secondary diabetes (HCC) -     gabapentin (NEURONTIN) 100 MG capsule; 2 tabs at bedtime and 1 tab 2 times throughout day - Had been taking 1 bid of '100mg'$  but burning pain in hands and feet is increasing  Uncomplicated Asthma   Continue use of Albuterol inhaler as needed and monitor symptoms   Over 40 minutes of exam, counseling, chart review and critical decision making was performed Future Appointments  Date Time Provider [place]  11/03/2021  4:00 PM Alycia Rossetti, NP [facility] None  02/18/2022 11:00 AM [name], MD [facility] None  08/24/2022  4:00 PM Alycia Rossetti, NP [facility] None     Plan:   During the course of the visit the patient was educated and counseled about appropriate screening and preventive services including:    Pneumococcal vaccine  Prevnar 13 Influenza vaccine Td vaccine Screening electrocardiogram Bone densitometry screening Colorectal cancer screening Diabetes screening Glaucoma screening Nutrition counseling  Advanced directives: requested   Subjective:  [name] is a 66 y.o. female who presents for Medicare Annual Wellness Visit and 3 month follow up.   Her blood pressure has been controlled at home, today their BP is   BP Readings from Last 3 Encounters:  07/31/21 130/64  06/11/21 130/72  02/12/21 140/78   She does workout. She denies chest pain, shortness of breath, dizziness.   She is on cholesterol medication and denies myalgias. Her cholesterol is at goal. The cholesterol last visit was:   Lab Results  Component Value Date   CHOL 97 06/11/2021   HDL 53 06/11/2021   LDLCALC 31 06/11/2021   TRIG 46 06/11/2021   CHOLHDL 1.8 06/11/2021    She has been working on diet and exercise for type 2 diabetes . Has started to get more parasthesias in hand and feet. Last A1C in the office was:  Lab Results  Component Value Date   HGBA1C 8.0 (H) 06/11/2021   Pt has noticed her asthma has had more flares, inhales relieves shortness of breath Last GFR:   Lab Results  Component Value Date   GFRAA 120 07/29/2020   Patient is on Vitamin D supplement.   Lab Results  Component Value Date   VD25OH 82 02/12/2021      Medication Review:  Current Outpatient Medications on File Prior to Visit  Medication Sig Dispense Refill   albuterol (VENTOLIN HFA) 108 (90 Base) MCG/ACT inhaler Use  1 to 2 inhalations  15 minutes  apart every 4 hours if needed to rescue asthma 48 g 3   aspirin EC 81 MG tablet Take 81 mg by mouth daily.     B Complex-C (SUPER B COMPLEX PO) Take 1 tablet by mouth daily. Takes every other day     Cholecalciferol (VITAMIN D PO) Take 2 capsules by mouth daily. Take 5000 units by mouth twice a day     cyclobenzaprine (FLEXERIL) 10 MG tablet TAKE 1/2 TO 1  TABLET AT BEDTIME FOR MUSCLE SPASM 90 tablet 1   Diclofenac Sodium 1.5 % SOLN Place a small amount of gel on the bilateral hands for hand arthritis 150 mL 0   empagliflozin (JARDIANCE) 25 MG TABS tablet Take 1 tab prior to breakfast daily for diabetes. (Patient not taking: Reported on 07/31/2021) 90 tablet 3   ferrous sulfate 325 (65 FE) MG tablet Take by mouth.     furosemide (LASIX) 20 MG tablet Take by mouth.     gabapentin (NEURONTIN) 600 MG tablet Take 1/2 to 1 tablet 2 to 3 x /Daily as needed for Neuropathy Pain 270 tablet 3   lisinopril-hydrochlorothiazide (ZESTORETIC) 10-12.5 MG tablet TAKE 1 TABLET DAILY FOR BLOOD PRESSURE AND DIABETIC KIDNEY PROTECTION 90 tablet 3   meloxicam (MOBIC) 15 MG tablet TAKE 1/2 TO 1 TABLET DAILY WITH FOOD FOR PAIN AND INFLAMMATION 90 tablet 0   metFORMIN (GLUCOPHAGE-XR) 500 MG 24 hr tablet TAKE 2 TABLETS TWICE DAILY WITH MEALS 360 tablet 3   metoprolol succinate (TOPROL-XL) 25 MG 24 hr tablet TAKE 1 TABLET EVERY DAY FOR BLOOD PRESSURE 90 tablet 3   mometasone (NASONEX) 50 MCG/ACT nasal spray Place 2 sprays into the nose daily. (Patient taking differently: Place 2 sprays into the nose as needed.) 51 g 3   Multiple Vitamin (MULITIVITAMIN WITH MINERALS) TABS Take 1 tablet by mouth daily.     pantoprazole (PROTONIX) 40 MG tablet TAKE ONE TABLET BY MOUTH DAILY TO PREVENT INDIGESTION AND HEARTBURN. 90 tablet 0   phentermine (ADIPEX-P) 37.5 MG tablet TAKE 1/2 TO 1 (ONE-HALF TO ONE) TABLET BY MOUTH IN THE MORNING AS NEEDED FOR  DIETING  AND  WEIGHT  LOSS 90 tablet 0   potassium chloride SA (KLOR-CON M20) 20 MEQ tablet TAKE 1 TABLET BY MOUTH 3 TIMES A DAY AFTER MEALS 270 tablet 3   Psyllium (METAMUCIL PO) Take 1 Dose by mouth daily.     rOPINIRole (REQUIP) 0.5 MG tablet Take 1 tablet 3 times a day for restless legs 270 tablet 3   rosuvastatin (CRESTOR) 20 MG tablet TAKE 1 TABLET EVERY DAY FOR CHOLESTEROL 90 tablet 3   triamcinolone cream (KENALOG) 0.1 % Apply 1  application topically 4 (four) times daily as needed. 30 g 0   No current facility-administered medications on file prior to visit.    Allergies  Allergen Reactions   Naprosyn [Naproxen] Other (See Comments)    GI upset    Current Problems (verified) Patient Active Problem List   Diagnosis Date Noted   BMI 23.0-23.9, adult 04/21/2020   Degeneration of lumbar or lumbosacral intervertebral disc 10/10/2019   Type 2 diabetes with stage 1 chronic kidney disease GFR>90 ([place]) 04/22/2015   Esotropia of right eye 04/22/2015   Palpitations 04/11/2015   Abnormal electrocardiogram 04/11/2015   Sleep disorder, shift-work 10/13/2014  Left carotid bruit 02/14/2014   Medication management 02/14/2014   Essential hypertension 07/27/2013   Hyperlipidemia associated with type 2 diabetes mellitus ([place]) 02/01/2013   Vitamin D deficiency 02/01/2013   BICUSPID AORTIC VALVE 07/03/2008   Cerebrovascular disease 07/02/2008   Asthma 07/02/2008   GERD 07/02/2008   Osteoarthritis 07/02/2008    Screening Tests Immunization History  Administered Date(s) Administered   DT (Pediatric) 10/11/2014   DTaP 03/18/2004   Influenza Inj Mdck Quad With Preservative 11/17/2016   Influenza Split 11/15/2013, 11/30/2014   Influenza Whole 11/02/2012   Influenza,inj,Quad PF,6+ Mos 10/21/2017, 09/30/2018   Influenza-Unspecified 10/31/2015, 10/21/2017, 11/27/2019   Moderna SARS-COV2 Booster Vaccination 05/26/2020   Moderna Sars-Covid-2 Vaccination 04/15/2019, 05/19/2019   PFIZER(Purple Top)SARS-COV-2 Vaccination 01/01/2020   PPD Test 07/30/2013, 10/11/2014, 11/06/2015, 11/17/2016, 12/12/2017, 12/27/2018, 01/08/2020   Pneumococcal Conjugate-13 11/15/2013, 11/17/2016   Pneumococcal Polysaccharide-23 05/16/1996   Zoster Recombinat (Shingrix) 01/02/2018, 04/05/2018, 04/19/2018    Preventative care: Last colonoscopy: 2016 Dr. Earlean Shawl due 2026 Last mammogram: 03/01/19 negative , order today Last pap smear/pelvic exam:  Due with next physical   DEXA:order today  Prior vaccinations: TD or Tdap: 2016  Influenza: 11/27/19 Pneumococcal: due for 23 Prevnar13: 2018 Shingles/Zostavax:11/19, 2/20, 3/20  Names of Other Physician/Practitioners you currently use: 1. [facility] here for primary care 2. Dr. Delman Cheadle, eye doctor, last visit 2022 3. Dr. Orene Desanctis, dentist, last visit 2022 Patient Care Team: [name], MD as PCP - General (Internal Medicine)  SURGICAL HISTORY She  has a past surgical history that includes repair of trigger finger and Abdominal hysterectomy. FAMILY HISTORY Her family history includes Breast cancer in her maternal aunt; Coronary artery disease in her mother; Diabetes in her father; Heart disease in her mother; Hypertension in her father and mother. SOCIAL HISTORY She  reports that she has never smoked. She has never used smokeless tobacco. She reports current alcohol use. She reports that she does not use drugs.   MEDICARE WELLNESS OBJECTIVES: Physical activity:   Cardiac risk factors:   Depression/mood screen:      11/03/2020   10:01 AM  Depression screen PHQ 2/9  Decreased Interest 0  Down, Depressed, Hopeless 0  PHQ - 2 Score 0    ADLs:     11/03/2020   10:01 AM  In your present state of health, do you have any difficulty performing the following activities:  Hearing? 0  Vision? 0  Difficulty concentrating or making decisions? 0  Walking or climbing stairs? 0  Dressing or bathing? 0  Doing errands, shopping? 0     Cognitive Testing  Alert? Yes  Normal Appearance?Yes  Oriented to person? Yes  Place? Yes   Time? Yes  Recall of three objects?  Yes  Can perform simple calculations? Yes  Displays appropriate judgment?Yes  Can read the correct time from a watch face?Yes  EOL planning:    Review of Systems  Constitutional:  Negative for chills, fever and weight loss.  HENT:  Negative for congestion, hearing loss, sinus  pain and sore throat.   Eyes:  Negative for blurred vision and double vision.  Respiratory:  Negative for cough and shortness of breath.   Cardiovascular:  Negative for chest pain, palpitations, orthopnea and leg swelling.  Gastrointestinal:  Negative for abdominal pain, constipation, diarrhea, heartburn, nausea and vomiting.  Musculoskeletal:  Negative for falls, joint pain and myalgias.  Skin:  Negative for rash.  Neurological:  Negative for dizziness, tingling, tremors, loss of consciousness and headaches.  Parasthesia of hands and feet  Psychiatric/Behavioral:  Negative for depression, memory loss and suicidal ideas.      Objective:     There were no vitals filed for this visit.  There is no height or weight on file to calculate BMI.  General appearance: alert, no distress, WD/WN, female HEENT: normocephalic, sclerae anicteric, Right eye gaze permanently deviatedTMs pearly, nares patent, no discharge or erythema, pharynx normal Oral cavity: MMM, no lesions Neck: supple, no lymphadenopathy, no thyromegaly, no masses Heart: RRR, normal S1, S2, no murmurs Lungs: CTA bilaterally, no wheezes, rhonchi, or rales Abdomen: +bs, soft, non tender, non distended, no masses, no hepatomegaly, no splenomegaly Musculoskeletal: nontender, no swelling, no obvious deformity Extremities: no edema, no cyanosis, no clubbing Pulses: 2+ symmetric, upper and lower extremities, normal cap refill Neurological: alert, oriented x 3, CN2-12 intact, strength normal upper extremities and lower extremities, sensation normal throughout, DTRs 2+ throughout, no cerebellar signs, gait normal Psychiatric: normal affect, behavior normal, pleasant  EKG: No ST changes AAA: <3cm   Medicare Attestation I have personally reviewed: The patient's medical and social history Their use of alcohol, tobacco or illicit drugs Their current medications and supplements, The patient's functional ability including ADLs,fall  risks, home safety risks, cognitive, and hearing and visual impairment Diet and physical activities Evidence for depression or mood disorders  The patient's weight, height, BMI, and visual acuity have been recorded in the chart.  I have made referrals, counseling, and provided education to the patient based on review of the above and I have provided the patient with a written personalized care plan for preventive services.     Alycia Rossetti, NP   11/02/2021

## 2021-11-03 ENCOUNTER — Ambulatory Visit: Payer: Medicare HMO | Admitting: Nurse Practitioner

## 2021-11-03 ENCOUNTER — Ambulatory Visit: Payer: Self-pay | Admitting: Nurse Practitioner

## 2021-11-03 DIAGNOSIS — J45909 Unspecified asthma, uncomplicated: Secondary | ICD-10-CM

## 2021-11-03 DIAGNOSIS — E1122 Type 2 diabetes mellitus with diabetic chronic kidney disease: Secondary | ICD-10-CM

## 2021-11-03 DIAGNOSIS — E1340 Other specified diabetes mellitus with diabetic neuropathy, unspecified: Secondary | ICD-10-CM

## 2021-11-03 DIAGNOSIS — E1169 Type 2 diabetes mellitus with other specified complication: Secondary | ICD-10-CM

## 2021-11-03 DIAGNOSIS — K219 Gastro-esophageal reflux disease without esophagitis: Secondary | ICD-10-CM

## 2021-11-03 DIAGNOSIS — E559 Vitamin D deficiency, unspecified: Secondary | ICD-10-CM

## 2021-11-03 DIAGNOSIS — I10 Essential (primary) hypertension: Secondary | ICD-10-CM

## 2021-11-03 DIAGNOSIS — Z0000 Encounter for general adult medical examination without abnormal findings: Secondary | ICD-10-CM

## 2021-11-03 DIAGNOSIS — Z6823 Body mass index (BMI) 23.0-23.9, adult: Secondary | ICD-10-CM

## 2021-11-03 DIAGNOSIS — Z79899 Other long term (current) drug therapy: Secondary | ICD-10-CM

## 2021-11-17 NOTE — Progress Notes (Unsigned)
ANNUAL MEDICARE WELLNESS VISIT AND FOLLOW UP  Assessment:   Leylah was seen today for follow-up and medicare wellness.  Diagnoses and all orders for this visit:   Encounter for Medicare Annual Wellness Due Yearly Does mammogram yearly will call to schedule DEXA ordered Needs PAP at next visit  Essential hypertension -     CBC with Differential/Platelet -- continue medications, DASH diet, exercise and monitor at home. Call if greater than 130/80.    Hyperlipidemia associated with type 2 diabetes mellitus (HCC) -     COMPLETE METABOLIC PANEL WITH GFR -     Lipid panel -     TSH - Continue medication, diet and exercise  Type 2 diabetes mellitus with stage 1 chronic kidney disease, without long-term current use of insulin (HCC) -     Hemoglobin A1c -     Microalbumin / creatinine urine ratio -     Urinalysis, Routine w reflex microscopic - Continue diet, exercise and medication  Vitamin D deficiency -     VITAMIN D 25 Hydroxy (Vit-D Deficiency, Fractures) - Continue Vit D supplementation  Medication management  -Reviewed medication   Gastroesophageal reflux disease, unspecified whether esophagitis present -     Magnesium - Continue Protonix in AM and add Famotidine at night and dietary changes  Overweight Long discussion about weight loss, diet, and exercise Recommended diet heavy in fruits and veggies and low in animal meats, cheeses, and dairy products, appropriate calorie intake Patient will work on decreasing saturated fats and simple carbs. Continue exercies Continue Phentermine Follow up at next visit   Neuropathy due to secondary diabetes ([place]) -     gabapentin (NEURONTIN) 100 MG capsule; 2 tabs at bedtime and 1 tab 2 times throughout day - Had been taking 1 bid of 177m but burning pain in hands and feet is increasing  Uncomplicated Asthma   Continue use of Albuterol inhaler as needed and monitor symptoms  Seasonal allergies Begin generic Zryrtec or  Allegra x 2 months and monitor symptoms  Need for influenza vaccine - High dose flu vaccine given  Need for Pneumococcal vaccine - Prevnar 20 given    Over 40 minutes of exam, counseling, chart review and critical decision making was performed Future Appointments  Date Time Provider [place] 02/18/2022 11:00 AM MUnk Pinto MD [facility] None  08/24/2022  4:00 PM WAlycia Rossetti NP [facility] None     Plan:   During the course of the visit the patient was educated and counseled about appropriate screening and preventive services including:   Pneumococcal vaccine  Prevnar 13 Influenza vaccine Td vaccine Screening electrocardiogram Bone densitometry screening Colorectal cancer screening Diabetes screening Glaucoma screening Nutrition counseling  Advanced directives: requested   Subjective:  JTONJI ELLIFFis a 66y.o. female who presents for Medicare Annual Wellness Visit and 3 month follow up.   BMI is Body mass index is 24.7 kg/m., she has been working on diet and exercise.Has been using Phentermine. She does do a lot of house and yard work. 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 She denies chest pain, shortness  of breath, dizziness.   She is on cholesterol medication and denies myalgias. Her cholesterol is at goal. The cholesterol last visit was:   Lab Results  Component Value Date   CHOL 97 06/11/2021   HDL 53 06/11/2021   LDLCALC 31 06/11/2021   TRIG 46 06/11/2021   CHOLHDL 1.8 06/11/2021    She has been working on diet and exercise for type 2 diabetes . Has started to get more parasthesias in hand and feet. Last  A1C in the office was:  Lab Results  Component Value Date   HGBA1C 8.0 (H) 06/11/2021   Pt has noticed her asthma has had more flares, inhalers relieves shortness of breath. She has been having more congestion and believes it is related to allergies  She is currently on Protonix daily for GERD but it is not controlling her symptoms, has used Famotidine in the past and can restart with Protonix.   Last GFR:   Lab Results  Component Value Date   EGFR 97 06/11/2021     Patient is on Vitamin D supplement.   Lab Results  Component Value Date   VD25OH 82 02/12/2021      Medication Review: Current Outpatient Medications on File Prior to Visit  Medication Sig Dispense Refill   albuterol (VENTOLIN HFA) 108 (90 Base) MCG/ACT inhaler Use  1 to 2 inhalations  15 minutes  apart every 4 hours if needed to rescue asthma 48 g 3   aspirin EC 81 MG tablet Take 81 mg by mouth daily.     B Complex-C (SUPER B COMPLEX PO) Take 1 tablet by mouth daily. Takes every other day     Cholecalciferol (VITAMIN D PO) Take 2 capsules by mouth daily. Take 5000 units by mouth twice a day     cyclobenzaprine (FLEXERIL) 10 MG tablet TAKE 1/2 TO 1 TABLET AT BEDTIME FOR MUSCLE SPASM 90 tablet 1   Diclofenac Sodium 1.5 % SOLN Place a small amount of gel on the bilateral hands for hand arthritis 150 mL 0   empagliflozin (JARDIANCE) 25 MG TABS tablet Take 1 tab prior to breakfast daily for diabetes. 90 tablet 3   ferrous sulfate 325 (65 FE) MG tablet Take by mouth.     furosemide (LASIX) 20 MG tablet Take by mouth.     gabapentin (NEURONTIN) 600 MG tablet Take 1/2 to 1 tablet 2 to 3 x /Daily as needed for Neuropathy Pain 270 tablet 3   lisinopril-hydrochlorothiazide (ZESTORETIC) 10-12.5 MG tablet TAKE 1 TABLET DAILY FOR BLOOD PRESSURE AND DIABETIC KIDNEY PROTECTION 90 tablet 3   meloxicam (MOBIC) 15 MG tablet TAKE 1/2 TO 1 TABLET DAILY WITH FOOD FOR PAIN AND INFLAMMATION 90 tablet 0   metFORMIN (GLUCOPHAGE-XR) 500 MG 24  hr tablet TAKE 2 TABLETS TWICE DAILY WITH MEALS 360 tablet 3   metoprolol succinate (TOPROL-XL) 25 MG 24 hr tablet TAKE 1 TABLET EVERY DAY FOR BLOOD PRESSURE 90 tablet 3   Multiple Vitamin (MULITIVITAMIN WITH MINERALS) TABS Take 1 tablet by mouth daily.     pantoprazole (PROTONIX) 40 MG tablet TAKE ONE TABLET BY MOUTH DAILY TO PREVENT INDIGESTION AND HEARTBURN. 90 tablet 0   potassium chloride SA (KLOR-CON M20) 20 MEQ tablet TAKE 1 TABLET BY MOUTH 3 TIMES A DAY AFTER MEALS 270 tablet 3   Psyllium (METAMUCIL PO) Take 1 Dose by mouth daily.     rOPINIRole (REQUIP) 0.5 MG tablet Take 1 tablet 3 times a day for restless legs 270 tablet 3  rosuvastatin (CRESTOR) 20 MG tablet TAKE 1 TABLET EVERY DAY FOR CHOLESTEROL 90 tablet 3   triamcinolone cream (KENALOG) 0.1 % Apply 1 application topically 4 (four) times daily as needed. 30 g 0   mometasone (NASONEX) 50 MCG/ACT nasal spray Place 2 sprays into the nose daily. (Patient taking differently: Place 2 sprays into the nose as needed.) 51 g 3   No current facility-administered medications on file prior to visit.    Allergies  Allergen Reactions   Naprosyn [Naproxen] Other (See Comments)    GI upset    Current Problems (verified) Patient Active Problem List   Diagnosis Date Noted   BMI 23.0-23.9, adult 04/21/2020   Degeneration of lumbar or lumbosacral intervertebral disc 10/10/2019   Type 2 diabetes with stage 1 chronic kidney disease GFR>90 ([place]) 04/22/2015   Esotropia of right eye 04/22/2015   Palpitations 04/11/2015   Abnormal electrocardiogram 04/11/2015   Sleep disorder, shift-work 10/13/2014   Left carotid bruit 02/14/2014   Medication management 02/14/2014   Essential hypertension 07/27/2013   Hyperlipidemia associated with type 2 diabetes mellitus ([place]) 02/01/2013   Vitamin D deficiency 02/01/2013   BICUSPID AORTIC VALVE 07/03/2008   Cerebrovascular disease 07/02/2008   Asthma 07/02/2008   GERD 07/02/2008   Osteoarthritis  07/02/2008    Screening Tests Immunization History  Administered Date(s) Administered   DT (Pediatric) 10/11/2014   DTaP 03/18/2004   Influenza Inj Mdck Quad With Preservative 11/17/2016   Influenza Split 11/15/2013, 11/30/2014   Influenza Whole 11/02/2012   Influenza, High Dose Seasonal PF 11/18/2021   Influenza,inj,Quad PF,6+ Mos 10/21/2017, 09/30/2018   Influenza-Unspecified 10/31/2015, 10/21/2017, 11/27/2019   Moderna SARS-COV2 Booster Vaccination 05/26/2020   Moderna Sars-Covid-2 Vaccination 04/15/2019, 05/19/2019   PFIZER(Purple Top)SARS-COV-2 Vaccination 01/01/2020   PNEUMOCOCCAL CONJUGATE-20 11/18/2021   PPD Test 07/30/2013, 10/11/2014, 11/06/2015, 11/17/2016, 12/12/2017, 12/27/2018, 01/08/2020   Pneumococcal Conjugate-13 11/15/2013, 11/17/2016   Pneumococcal Polysaccharide-23 05/16/1996   Zoster Recombinat (Shingrix) 01/02/2018, 04/05/2018, 04/19/2018    Preventative care: Last colonoscopy: 2016 Dr. Earlean Shawl due 2026 Last mammogram: 03/01/19 negative , order today Last pap smear/pelvic exam: Due with next physical   DEXA:order today  Prior vaccinations: TD or Tdap: 2016  Influenza: 11/27/19 Pneumococcal: due for 23 Prevnar13: 2018 Shingles/Zostavax:11/19, 2/20, 3/20  Names of Other Physician/Practitioners you currently use: 1. [facility] here for primary care 2. Dr. Lorina Rabon, eye doctor, last visit 2023 3. Dr. Orene Desanctis, dentist, last visit 2023 Patient Care Team: [name], MD as PCP - General (Internal Medicine)  SURGICAL HISTORY She  has a past surgical history that includes repair of trigger finger and Abdominal hysterectomy. FAMILY HISTORY Her family history includes Breast cancer in her maternal aunt; Coronary artery disease in her mother; Diabetes in her father; Heart disease in her mother; Hypertension in her father and mother. SOCIAL HISTORY She  reports that she has never smoked. She has never used smokeless  tobacco. She reports current alcohol use. She reports that she does not use drugs.   MEDICARE WELLNESS OBJECTIVES: Physical activity: Current Exercise Habits: Home exercise routine, Type of exercise: walking, Time (Minutes): 40, Frequency (Times/Week): 5, Weekly Exercise (Minutes/Week): 200, Intensity: Mild, Exercise limited by: None identified Cardiac risk factors: Cardiac Risk Factors include: advanced age (>43mn, >>58women);diabetes mellitus;dyslipidemia;hypertension Depression/mood screen:      11/18/2021    9:47 AM  Depression screen PHQ 2/9  Decreased Interest 0  Down, Depressed, Hopeless 0  PHQ - 2 Score 0    ADLs:  11/18/2021    9:48 AM  In your present state of health, do you have any difficulty performing the following activities:  Hearing? 0  Vision? 0  Difficulty concentrating or making decisions? 0  Walking or climbing stairs? 0  Dressing or bathing? 0  Doing errands, shopping? 0     Cognitive Testing  Alert? Yes  Normal Appearance?Yes  Oriented to person? Yes  Place? Yes   Time? Yes  Recall of three objects?  Yes  Can perform simple calculations? Yes  Displays appropriate judgment?Yes  Can read the correct time from a watch face?Yes  EOL planning: Does Patient Have a Medical Advance Directive?: No Would patient like information on creating a medical advance directive?: No - Patient declined  Review of Systems  Constitutional:  Negative for chills, fever and weight loss.  HENT:  Positive for congestion. Negative for hearing loss, sinus pain and sore throat.   Eyes:  Negative for blurred vision and double vision.  Respiratory:  Negative for cough and shortness of breath.   Cardiovascular:  Negative for chest pain, palpitations, orthopnea and leg swelling.  Gastrointestinal:  Positive for heartburn. Negative for abdominal pain, constipation, diarrhea, nausea and vomiting.  Musculoskeletal:  Negative for falls, joint pain and myalgias.  Skin:  Negative for  rash.  Neurological:  Negative for dizziness, tingling, tremors, loss of consciousness and headaches.       Parasthesia of hands and feet  Psychiatric/Behavioral:  Negative for depression, memory loss and suicidal ideas.      Objective:     Today's Vitals   11/18/21 0919  BP: (!) 140/68  Pulse: 96  Temp: 97.7 F (36.5 C)  SpO2: 96%  Weight: 118 lb 3.2 oz (53.6 kg)  Height: 4' 10"  (1.473 m)   Body mass index is 24.7 kg/m.  General appearance: alert, no distress, WD/WN, female HEENT: normocephalic, sclerae anicteric, Right eye gaze permanently deviatedTMs pearly, nares patent, no discharge or erythema, pharynx normal Oral cavity: MMM, no lesions Neck: supple, no lymphadenopathy, no thyromegaly, no masses Heart: RRR, normal S1, S2, no murmurs Lungs: CTA bilaterally, no wheezes, rhonchi, or rales Abdomen: +bs, soft, non tender, non distended, no masses, no hepatomegaly, no splenomegaly Musculoskeletal: nontender, no swelling, no obvious deformity Extremities: no edema, no cyanosis, no clubbing Pulses: 2+ symmetric, upper and lower extremities, normal cap refill Neurological: alert, oriented x 3, CN2-12 intact, strength normal upper extremities and lower extremities, sensation normal throughout, DTRs 2+ throughout, no cerebellar signs, gait normal Psychiatric: normal affect, behavior normal, pleasant     Medicare Attestation I have personally reviewed: The patient's medical and social history Their use of alcohol, tobacco or illicit drugs Their current medications and supplements, The patient's functional ability including ADLs,fall risks, home safety risks, cognitive, and hearing and visual impairment Diet and physical activities Evidence for depression or mood disorders  The patient's weight, height, BMI, and visual acuity have been recorded in the chart.  I have made referrals, counseling, and provided education to the patient based on review of the above and I have  provided the patient with a written personalized care plan for preventive services.     Alycia Rossetti, NP   11/18/2021

## 2021-11-18 ENCOUNTER — Encounter: Payer: Self-pay | Admitting: Nurse Practitioner

## 2021-11-18 ENCOUNTER — Ambulatory Visit (INDEPENDENT_AMBULATORY_CARE_PROVIDER_SITE_OTHER): Payer: Medicare HMO | Admitting: Nurse Practitioner

## 2021-11-18 VITALS — BP 140/68 | HR 96 | Temp 97.7°F | Ht <= 58 in | Wt 118.2 lb

## 2021-11-18 DIAGNOSIS — R6889 Other general symptoms and signs: Secondary | ICD-10-CM | POA: Diagnosis not present

## 2021-11-18 DIAGNOSIS — E1122 Type 2 diabetes mellitus with diabetic chronic kidney disease: Secondary | ICD-10-CM

## 2021-11-18 DIAGNOSIS — I10 Essential (primary) hypertension: Secondary | ICD-10-CM | POA: Diagnosis not present

## 2021-11-18 DIAGNOSIS — E1340 Other specified diabetes mellitus with diabetic neuropathy, unspecified: Secondary | ICD-10-CM | POA: Diagnosis not present

## 2021-11-18 DIAGNOSIS — K219 Gastro-esophageal reflux disease without esophagitis: Secondary | ICD-10-CM | POA: Diagnosis not present

## 2021-11-18 DIAGNOSIS — N181 Chronic kidney disease, stage 1: Secondary | ICD-10-CM | POA: Diagnosis not present

## 2021-11-18 DIAGNOSIS — Z0001 Encounter for general adult medical examination with abnormal findings: Secondary | ICD-10-CM | POA: Diagnosis not present

## 2021-11-18 DIAGNOSIS — J45909 Unspecified asthma, uncomplicated: Secondary | ICD-10-CM

## 2021-11-18 DIAGNOSIS — Z23 Encounter for immunization: Secondary | ICD-10-CM

## 2021-11-18 DIAGNOSIS — Z79899 Other long term (current) drug therapy: Secondary | ICD-10-CM | POA: Diagnosis not present

## 2021-11-18 DIAGNOSIS — E2839 Other primary ovarian failure: Secondary | ICD-10-CM

## 2021-11-18 DIAGNOSIS — E1169 Type 2 diabetes mellitus with other specified complication: Secondary | ICD-10-CM | POA: Diagnosis not present

## 2021-11-18 DIAGNOSIS — E663 Overweight: Secondary | ICD-10-CM

## 2021-11-18 DIAGNOSIS — Z0000 Encounter for general adult medical examination without abnormal findings: Secondary | ICD-10-CM

## 2021-11-18 DIAGNOSIS — E559 Vitamin D deficiency, unspecified: Secondary | ICD-10-CM

## 2021-11-18 DIAGNOSIS — E785 Hyperlipidemia, unspecified: Secondary | ICD-10-CM | POA: Diagnosis not present

## 2021-11-18 DIAGNOSIS — Z9109 Other allergy status, other than to drugs and biological substances: Secondary | ICD-10-CM

## 2021-11-18 MED ORDER — PHENTERMINE HCL 37.5 MG PO TABS: ORAL_TABLET | ORAL | 0 refills | Status: DC

## 2021-11-18 NOTE — Patient Instructions (Signed)
Continue Protonix in the morning and add Famotidine at night  Start generic Zyrtec or Allegra for seasonal allergies x 2-3 months  Gastroesophageal Reflux Disease, Adult  Gastroesophageal reflux (GER) happens when acid from the stomach flows up into the tube that connects the mouth and the stomach (esophagus). Normally, food travels down the esophagus and stays in the stomach to be digested. With GER, food and stomach acid sometimes move back up into the esophagus. You may have a disease called gastroesophageal reflux disease (GERD) if the reflux: Happens often. Causes frequent or very bad symptoms. Causes problems such as damage to the esophagus. When this happens, the esophagus becomes sore and swollen. Over time, GERD can make small holes (ulcers) in the lining of the esophagus. What are the causes? This condition is caused by a problem with the muscle between the esophagus and the stomach. When this muscle is weak or not normal, it does not close properly to keep food and acid from coming back up from the stomach. The muscle can be weak because of: Tobacco use. Pregnancy. Having a certain type of hernia (hiatal hernia). Alcohol use. Certain foods and drinks, such as coffee, chocolate, onions, and peppermint. What increases the risk? Being overweight. Having a disease that affects your connective tissue. Taking NSAIDs, such a ibuprofen. What are the signs or symptoms? Heartburn. Difficult or painful swallowing. The feeling of having a lump in the throat. A bitter taste in the mouth. Bad breath. Having a lot of saliva. Having an upset or bloated stomach. Burping. Chest pain. Different conditions can cause chest pain. Make sure you see your doctor if you have chest pain. Shortness of breath or wheezing. A long-term cough or a cough at night. Wearing away of the surface of teeth (tooth enamel). Weight loss. How is this treated? Making changes to your diet. Taking  medicine. Having surgery. Treatment will depend on how bad your symptoms are. Follow these instructions at home: Eating and drinking  Follow a diet as told by your doctor. You may need to avoid foods and drinks such as: Coffee and tea, with or without caffeine. Drinks that contain alcohol. Energy drinks and sports drinks. Bubbly (carbonated) drinks or sodas. Chocolate and cocoa. Peppermint and mint flavorings. Garlic and onions. Horseradish. Spicy and acidic foods. These include peppers, chili powder, curry powder, vinegar, hot sauces, and BBQ sauce. Citrus fruit juices and citrus fruits, such as oranges, lemons, and limes. Tomato-based foods. These include red sauce, chili, salsa, and pizza with red sauce. Fried and fatty foods. These include donuts, french fries, potato chips, and high-fat dressings. High-fat meats. These include hot dogs, rib eye steak, sausage, ham, and bacon. High-fat dairy items, such as whole milk, butter, and cream cheese. Eat small meals often. Avoid eating large meals. Avoid drinking large amounts of liquid with your meals. Avoid eating meals during the 2-3 hours before bedtime. Avoid lying down right after you eat. Do not exercise right after you eat. Lifestyle  Do not smoke or use any products that contain nicotine or tobacco. If you need help quitting, ask your doctor. Try to lower your stress. If you need help doing this, ask your doctor. If you are overweight, lose an amount of weight that is healthy for you. Ask your doctor about a safe weight loss goal. General instructions Pay attention to any changes in your symptoms. Take over-the-counter and prescription medicines only as told by your doctor. Do not take aspirin, ibuprofen, or other NSAIDs unless your doctor  says it is okay. Wear loose clothes. Do not wear anything tight around your waist. Raise (elevate) the head of your bed about 6 inches (15 cm). You may need to use a wedge to do  this. Avoid bending over if this makes your symptoms worse. Keep all follow-up visits. Contact a doctor if: You have new symptoms. You lose weight and you do not know why. You have trouble swallowing or it hurts to swallow. You have wheezing or a cough that keeps happening. You have a hoarse voice. Your symptoms do not get better with treatment. Get help right away if: You have sudden pain in your arms, neck, jaw, teeth, or back. You suddenly feel sweaty, dizzy, or light-headed. You have chest pain or shortness of breath. You vomit and the vomit is green, yellow, or black, or it looks like blood or coffee grounds. You faint. Your poop (stool) is red, bloody, or black. You cannot swallow, drink, or eat. These symptoms may represent a serious problem that is an emergency. Do not wait to see if the symptoms will go away. Get medical help right away. Call your local emergency services (911 in the [place] Do not drive yourself to the hospital. Summary If a person has gastroesophageal reflux disease (GERD), food and stomach acid move back up into the esophagus and cause symptoms or problems such as damage to the esophagus. Treatment will depend on how bad your symptoms are. Follow a diet as told by your doctor. Take all medicines only as told by your doctor. This information is not intended to replace advice given to you by your health care provider. Make sure you discuss any questions you have with your health care provider. Document Revised: 08/13/2019 Document Reviewed: 08/13/2019 Elsevier Patient Education  [place].

## 2021-11-19 LAB — TSH: TSH: 0.77 mIU/L (ref 0.40–4.50)

## 2021-11-19 LAB — CBC WITH DIFFERENTIAL/PLATELET
Absolute Monocytes: 591 cells/uL (ref 200–950)
Basophils Absolute: 58 cells/uL (ref 0–200)
Basophils Relative: 0.8 %
Eosinophils Absolute: 146 cells/uL (ref 15–500)
Eosinophils Relative: 2 %
HCT: 39.8 % (ref 35.0–45.0)
Hemoglobin: 13 g/dL (ref 11.7–15.5)
Lymphs Abs: 2044 cells/uL (ref 850–3900)
MCH: 31.3 pg (ref 27.0–33.0)
MCHC: 32.7 g/dL (ref 32.0–36.0)
MCV: 95.9 fL (ref 80.0–100.0)
MPV: 11.3 fL (ref 7.5–12.5)
Monocytes Relative: 8.1 %
Neutro Abs: 4460 cells/uL (ref 1500–7800)
Neutrophils Relative %: 61.1 %
Platelets: 267 10*3/uL (ref 140–400)
RBC: 4.15 10*6/uL (ref 3.80–5.10)
RDW: 12.8 % (ref 11.0–15.0)
Total Lymphocyte: 28 %
WBC: 7.3 10*3/uL (ref 3.8–10.8)

## 2021-11-19 LAB — COMPLETE METABOLIC PANEL WITH GFR
AG Ratio: 1.9 (calc) (ref 1.0–2.5)
ALT: 15 U/L (ref 6–29)
AST: 15 U/L (ref 10–35)
Albumin: 4.3 g/dL (ref 3.6–5.1)
Alkaline phosphatase (APISO): 75 U/L (ref 37–153)
BUN: 19 mg/dL (ref 7–25)
CO2: 28 mmol/L (ref 20–32)
Calcium: 9.1 mg/dL (ref 8.6–10.4)
Chloride: 103 mmol/L (ref 98–110)
Creat: 0.54 mg/dL (ref 0.50–1.05)
Globulin: 2.3 g/dL (calc) (ref 1.9–3.7)
Glucose, Bld: 151 mg/dL — ABNORMAL HIGH (ref 65–99)
Potassium: 4.3 mmol/L (ref 3.5–5.3)
Sodium: 141 mmol/L (ref 135–146)
Total Bilirubin: 0.2 mg/dL (ref 0.2–1.2)
Total Protein: 6.6 g/dL (ref 6.1–8.1)
eGFR: 101 mL/min/{1.73_m2} (ref >=60)

## 2021-11-19 LAB — LIPID PANEL
Cholesterol: 92 mg/dL (ref <200)
HDL: 45 mg/dL — ABNORMAL LOW (ref >=50)
LDL Cholesterol (Calc): 33 mg/dL (calc)
Non-HDL Cholesterol (Calc): 47 mg/dL (calc) (ref <130)
Total CHOL/HDL Ratio: 2 (calc) (ref <5.0)
Triglycerides: 60 mg/dL (ref <150)

## 2021-11-19 LAB — HEMOGLOBIN A1C
Hgb A1c MFr Bld: 7.2 % of total Hgb — ABNORMAL HIGH (ref <5.7)
Mean Plasma Glucose: 160 mg/dL
eAG (mmol/L): 8.9 mmol/L

## 2021-11-19 LAB — MICROALBUMIN / CREATININE URINE RATIO
Creatinine, Urine: 105 mg/dL (ref 20–275)
Microalb Creat Ratio: 6 mcg/mg creat (ref <30)
Microalb, Ur: 0.6 mg/dL

## 2021-11-29 ENCOUNTER — Other Ambulatory Visit: Payer: Self-pay | Admitting: Nurse Practitioner

## 2021-11-29 DIAGNOSIS — M79604 Pain in right leg: Secondary | ICD-10-CM

## 2021-12-07 ENCOUNTER — Other Ambulatory Visit: Payer: Self-pay | Admitting: Nurse Practitioner

## 2021-12-07 DIAGNOSIS — G8929 Other chronic pain: Secondary | ICD-10-CM

## 2021-12-14 ENCOUNTER — Telehealth: Payer: Self-pay | Admitting: Nurse Practitioner

## 2021-12-14 NOTE — Telephone Encounter (Signed)
Pharm wont fill meds due to her being under the weight and height requirement

## 2021-12-14 NOTE — Telephone Encounter (Signed)
I dont know how they would know her height or weight.  That doesn't make sense. She could try a different pharmacy

## 2021-12-17 ENCOUNTER — Other Ambulatory Visit: Payer: Self-pay | Admitting: Nurse Practitioner

## 2021-12-17 DIAGNOSIS — E663 Overweight: Secondary | ICD-10-CM

## 2021-12-17 MED ORDER — PHENTERMINE HCL 37.5 MG PO TABS: ORAL_TABLET | ORAL | 0 refills | Status: DC

## 2021-12-17 NOTE — Telephone Encounter (Signed)
I have sent the prescription to [facility]

## 2021-12-17 NOTE — Progress Notes (Signed)
PDMP is reviewed and appropriate  

## 2022-01-12 ENCOUNTER — Other Ambulatory Visit: Payer: Self-pay | Admitting: Nurse Practitioner

## 2022-01-14 ENCOUNTER — Other Ambulatory Visit: Payer: Self-pay | Admitting: Internal Medicine

## 2022-01-20 ENCOUNTER — Other Ambulatory Visit: Payer: Self-pay | Admitting: Nurse Practitioner

## 2022-01-20 DIAGNOSIS — M5432 Sciatica, left side: Secondary | ICD-10-CM

## 2022-01-28 ENCOUNTER — Encounter: Payer: Self-pay | Admitting: Internal Medicine

## 2022-02-04 ENCOUNTER — Other Ambulatory Visit: Payer: Self-pay

## 2022-02-04 ENCOUNTER — Telehealth: Payer: Self-pay | Admitting: Nurse Practitioner

## 2022-02-04 MED ORDER — PANTOPRAZOLE SODIUM 40 MG PO TBEC: DELAYED_RELEASE_TABLET | ORAL | 0 refills | Status: DC

## 2022-02-04 NOTE — Telephone Encounter (Signed)
Please resend pantoprazole to [facility] on [address]

## 2022-02-16 ENCOUNTER — Encounter (HOSPITAL_COMMUNITY): Payer: Self-pay | Admitting: *Deleted

## 2022-02-16 ENCOUNTER — Ambulatory Visit (HOSPITAL_COMMUNITY)
Admission: EM | Admit: 2022-02-16 | Discharge: 2022-02-16 | Disposition: A | Discharge status: Home / Self Care | Payer: Medicare HMO | Attending: Emergency Medicine | Admitting: Emergency Medicine

## 2022-02-16 ENCOUNTER — Other Ambulatory Visit: Payer: Self-pay

## 2022-02-16 DIAGNOSIS — Z1152 Encounter for screening for COVID-19: Secondary | ICD-10-CM

## 2022-02-16 DIAGNOSIS — Z20822 Contact with and (suspected) exposure to covid-19: Secondary | ICD-10-CM | POA: Diagnosis not present

## 2022-02-16 NOTE — ED Provider Notes (Signed)
[place]    CSN: [id number] Arrival date & time: 02/16/22  1008      History   Chief Complaint Chief Complaint  Patient presents with   Covid Exposure    HPI [name] is a 67 y.o. female.  Patient presents for COVID testing due to a COVID exposure that happened 5 days ago. Patient denies any viral symptoms.  HPI  Past Medical History:  Diagnosis Date   Allergic rhinitis    ASTHMA    BICUSPID AORTIC VALVE    CEREBROVASCULAR DISEASE    DEGENERATIVE JOINT DISEASE    Diabetes mellitus    DJD (degenerative joint disease)    GERD    HSV-1 (herpes simplex virus 1) infection    HYPERLIPIDEMIA    Hypertension    Palpitation    Vitamin D deficiency     Patient Active Problem List   Diagnosis Date Noted   BMI 23.0-23.9, adult 04/21/2020   Degeneration of lumbar or lumbosacral intervertebral disc 10/10/2019   Type 2 diabetes with stage 1 chronic kidney disease GFR>90 ([place]) 04/22/2015   Esotropia of right eye 04/22/2015   Palpitations 04/11/2015   Abnormal electrocardiogram 04/11/2015   Sleep disorder, shift-work 10/13/2014   Left carotid bruit 02/14/2014   Medication management 02/14/2014   Essential hypertension 07/27/2013   Hyperlipidemia associated with type 2 diabetes mellitus ([place]) 02/01/2013   Vitamin D deficiency 02/01/2013   BICUSPID AORTIC VALVE 07/03/2008   Cerebrovascular disease 07/02/2008   Asthma 07/02/2008   GERD 07/02/2008   Osteoarthritis 07/02/2008    Past Surgical History:  Procedure Laterality Date   ABDOMINAL HYSTERECTOMY     repair of trigger finger      OB History   No obstetric history on file.      Home Medications    Prior to Admission medications   Medication Sig Start Date End Date Taking? Authorizing Provider  aspirin EC 81 MG tablet Take 81 mg by mouth daily.   Yes [provider]  B Complex-C (SUPER B COMPLEX PO) Take 1 tablet by mouth daily. Takes every other day   Yes [provider]  Cholecalciferol (VITAMIN D PO) Take 2 capsules by mouth daily. Take 5000 units by mouth twice a day   Yes [provider]  empagliflozin (JARDIANCE) 25 MG TABS tablet Take 1 tab prior to breakfast daily for diabetes. 06/12/21  Yes Liane Comber, NP  ferrous sulfate 325 (65 FE) MG tablet Take by mouth.   Yes [provider]  furosemide (LASIX) 20 MG tablet Take by mouth. 03/19/16  Yes [provider]  gabapentin (NEURONTIN) 600 MG tablet Take 1/2 to 1 tablet 2 to 3 x /Daily as needed for Neuropathy Pain 02/12/21  Yes [name], MD  lisinopril-hydrochlorothiazide (ZESTORETIC) 10-12.5 MG tablet TAKE 1 TABLET DAILY FOR BLOOD PRESSURE AND DIABETIC KIDNEY PROTECTION 01/14/22  Yes Alycia Rossetti, NP  meloxicam (MOBIC) 15 MG tablet TAKE 1/2 TO 1 TABLET DAILY WITH FOOD FOR PAIN AND INFLAMMATION 01/21/22  Yes Alycia Rossetti, NP  metFORMIN (GLUCOPHAGE-XR) 500 MG 24 hr tablet TAKE 2 TABLETS TWICE DAILY WITH MEALS 08/26/21  Yes Alycia Rossetti, NP  metoprolol succinate (TOPROL-XL) 25 MG 24 hr tablet TAKE 1 TABLET EVERY DAY FOR BLOOD PRESSURE 08/26/21  Yes Alycia Rossetti, NP  Multiple Vitamin (MULITIVITAMIN WITH MINERALS) TABS Take 1 tablet by mouth daily.   Yes [provider]  pantoprazole (PROTONIX) 40 MG tablet TAKE 1 TABLET BY MOUTH  ONCE DAILY TO  PREVENT  INDIGESTION  AND  HEARTBURN 02/04/22  Yes Alycia Rossetti, NP  potassium chloride SA (KLOR-CON M20) 20 MEQ tablet TAKE 1 TABLET BY MOUTH 3 TIMES A DAY AFTER MEALS 01/15/21  Yes [name], MD  Psyllium (METAMUCIL PO) Take 1 Dose by mouth daily.   Yes [provider]  rOPINIRole (REQUIP) 0.5 MG tablet Take 1 tablet 3 x/ day for Restless Legs 11/29/21  Yes [name], MD  rosuvastatin (CRESTOR) 20 MG tablet TAKE 1 TABLET EVERY DAY FOR CHOLESTEROL 08/26/21  Yes Alycia Rossetti, NP  albuterol (VENTOLIN HFA) 108 (90 Base) MCG/ACT inhaler Use  1 to 2 inhalations  15 minutes   apart every 4 hours if needed to rescue asthma 10/10/20 10/31/22  [name], MD  cyclobenzaprine (FLEXERIL) 10 MG tablet TAKE 1/2 TO 1 TABLET AT BEDTIME FOR MUSCLE SPASM 12/07/21   Alycia Rossetti, NP  Diclofenac Sodium 1.5 % SOLN Place a small amount of gel on the bilateral hands for hand arthritis 02/19/16   Rolene Course, PA-C  mometasone (NASONEX) 50 MCG/ACT nasal spray Place 2 sprays into the nose daily. Patient taking differently: Place 2 sprays into the nose as needed. 06/30/18 06/11/21  [name], MD  phentermine (ADIPEX-P) 37.5 MG tablet TAKE 1/2 TO 1 (ONE-HALF TO ONE) TABLET BY MOUTH IN THE MORNING AS NEEDED FOR  DIETING  AND  WEIGHT  LOSS 12/17/21   Alycia Rossetti, NP  triamcinolone cream (KENALOG) 0.1 % Apply 1 application topically 4 (four) times daily as needed. 01/25/17   [name], MD    Family History Family History  Problem Relation Age of Onset   Coronary artery disease Mother    Heart disease Mother    Hypertension Mother    Diabetes Father    Hypertension Father    Breast cancer Maternal Aunt     Social History Social History   Tobacco Use   Smoking status: Never   Smokeless tobacco: Never  Vaping Use   Vaping Use: Never used  Substance Use Topics   Alcohol use: Not Currently   Drug use: No     Allergies   Naprosyn [naproxen]   Review of Systems Review of Systems  Constitutional: Negative.   HENT: Negative.    Respiratory: Negative.    Cardiovascular: Negative.   Gastrointestinal: Negative.      Physical Exam Triage Vital Signs ED Triage Vitals [02/16/22 1322]  Enc Vitals Group     BP (!) 162/73     Pulse Rate 68     Resp 16     Temp 98.4 F (36.9 C)     Temp Source Oral     SpO2 97 %     Weight      Height      Head Circumference      Peak Flow      Pain Score 0     Pain Loc      Pain Edu?      Excl. in [place]?    No data found.  Updated Vital Signs BP (!) 162/73   Pulse 68   Temp 98.4 F (36.9 C) (Oral)    Resp 16   SpO2 97%     Physical Exam Vitals and nursing note reviewed.  Constitutional:      Appearance: Normal appearance.  Cardiovascular:     Comments: Deferred exam Pulmonary:     Comments: Deferred exam Neurological:     Mental Status:  She is alert.      UC Treatments / Results  Labs (all labs ordered are listed, but only abnormal results are displayed) Labs Reviewed  SARS CORONAVIRUS 2 (TAT 6-24 HRS)    EKG   Radiology No results found.  Procedures Procedures (including critical care time)  Medications Ordered in UC Medications - No data to display  Initial Impression / Assessment and Plan / UC Course  I have reviewed the triage vital signs and the nursing notes.  Pertinent labs & imaging results that were available during my care of the patient were reviewed by me and considered in my medical decision making (see chart for details).   Patient was evaluated for COVID-19 screening and exposure to COVID.  Patient was made aware of results reporting protocol and MyChart.  Patient verbalized understanding of instructions.   Final Clinical Impressions(s) / UC Diagnoses   Final diagnoses:  Encounter for screening laboratory testing for COVID-19 virus in asymptomatic patient  Close exposure to COVID-19 virus     Discharge Instructions      We will call you if any of your test results are positive, you may view these test results on MyChart.       ED Prescriptions   None    PDMP not reviewed this encounter.   Flossie Dibble, NP 02/16/22 [phone]

## 2022-02-16 NOTE — Discharge Instructions (Addendum)
We will call you if any of your test results are positive, you may view these test results on MyChart.

## 2022-02-16 NOTE — ED Triage Notes (Signed)
Pt denies any sxs. Reports having had a Covid exposure 5 days ago, wishes to be tested.

## 2022-02-17 ENCOUNTER — Encounter: Payer: Self-pay | Admitting: Internal Medicine

## 2022-02-17 LAB — SARS CORONAVIRUS 2 (TAT 6-24 HRS): SARS Coronavirus 2: NEGATIVE

## 2022-02-17 NOTE — Progress Notes (Unsigned)
Annual Screening/Preventative Visit & Comprehensive Evaluation &  Examination   Future Appointments  Date Time Provider Department  02/18/2022 11:00 AM [name], MD [facility]  08/24/2022  4:00 PM Alycia Rossetti, NP [facility]  02/22/2023 11:00 AM [name], MD [facility]        This very nice 67 y.o. MBF  presents for a Screening /Preventative Visit & comprehensive evaluation and management of multiple medical co-morbidities.  Patient has been followed for HTN, HLD, T2_NIDDM  and Vitamin D Deficiency. Patient has hx/o GERD controlled with Diet & Famotidine.          HTN predates circa 1996.   Patient's BP has been controlled at home and patient denies any cardiac symptoms as chest pain, palpitations, shortness of breath, dizziness or ankle swelling. Today's BP is at goal - 120/60 .         Patient's hyperlipidemia is controlled with diet and medications. Patient denies myalgias or other medication SE's. Last lipids were  at goal :  Lab Results  Component Value Date   CHOL 92 11/18/2021   HDL 45 (L) 11/18/2021   LDLCALC 33 11/18/2021   TRIG 60 11/18/2021   CHOLHDL 2.0 11/18/2021           Patient has hx/o T2_NIDDM (2003) w/ CKD1  and she admits not checking CBG's.  She  denies reactive hypoglycemic symptoms, visual blurring, diabetic polys or paresthesias. Last A1c was not at goal :  Lab Results  Component Value Date   HGBA1C 7.2 (H) 11/18/2021         Finally, patient has history of Vitamin D Deficiency ("30" /2008)  and last Vitamin D was at goal :  Lab Results  Component Value Date   VD25OH 82 02/12/2021     Meds    albuterol  HFA inhaler, Use  1 to 2 inhalations  every 4 hours if needed to rescue asthma,    aspirin EC 81 MG tablet, Take  daily  SUPER B COMPLEX , Take 1 tablet by mouth daily. Takes every other day, Disp: , Rfl:   VITAMIN D ,  Take 5000 units by mouth twice a day,   cyclobenzaprine  10 MG tablet, TAKE 1/2 TO 1 TABLET AT  BEDTIME    Diclofenac  1.5 % SOLN, Place  gel on the bilateral hands for  arthritis,    JARDIANCE  25 MG TABS tablet, Take 1 tab daily   ferrous sulfate 325 (65 FE) MG tablet, Take daily   furosemide \ 20 MG tablet, Take daily   gabapentin 600 MG tablet, Take 1/2 to 1 tablet 2 to 3 x /Daily as needed for Neuropathy Pain,    lisinopril-hctz 10-12.5 MG tablet, TAKE 1 TABLET DAILY   meloxicam (MOBIC) 15 MG tablet, TAKE 1/2 TO 1 TABLET DAILY WITH FOOD    metFORMIN-XR  500 MG , TAKE 2 TABLETS TWICE DAILY WITH MEALS, Disp: 360 tablet, Rfl: 3   metoprolol succinate -XL 25 MG , TAKE 1 TABLET EVERY DAY    Multiple Vitamin , Take 1 tablet  daily., Disp: , Rfl:    pantoprazole  40 MG tablet, TAKE 1 TABLET  DAILY    potassium chloride 20 MEQ tablet, TAKE 1 TABLET 3 TIMES A DAY    rOPINIRole  0.5 MG tablet, Take 1 tablet 3 x/ day for Restless Legs   rosuvastatin 20 MG tablet, TAKE 1 TABLET EVERY DAY    triamcinolone cream 0.1 %, Apply  4 times daily as needed  NASONEX  nasal spray, Place 2 sprays into the nose daily.   phentermine (ADIPEX-P) 37.5 MG tablet, TAKE 1/2 TO 1  TABLET daily    Allergies  Allergen Reactions   Naprosyn [Naproxen] Other (See Comments)    GI upset     Past Medical History:  Diagnosis Date   Allergic rhinitis    ASTHMA    BICUSPID AORTIC VALVE    CEREBROVASCULAR DISEASE    DEGENERATIVE JOINT DISEASE    Diabetes mellitus    DJD (degenerative joint disease)    GERD    HSV-1 (herpes simplex virus 1) infection    HYPERLIPIDEMIA    Hypertension    Palpitation    Vitamin D deficiency      Health Maintenance  Topic Date Due   Pneumonia Vaccine 21+ Years old (35 - PPSV23 if available, else PCV20) 11/17/2017   COVID-19 Vaccine (4 - Booster) 07/21/2020   INFLUENZA VACCINE  09/15/2020   PAP SMEAR-Modifier  11/03/2021 (Originally 07/27/2016)   DEXA SCAN  11/03/2021 (Originally 10/27/2020)   HEMOGLOBIN A1C  05/03/2021   OPHTHALMOLOGY EXAM  08/05/2021   FOOT EXAM   11/03/2021   MAMMOGRAM  11/21/2022   COLONOSCOPY 01/27/2025   Hepatitis C Screening  Completed   HIV Screening  Completed   Zoster Vaccines- Shingrix  Completed   HPV VACCINES  Aged Out     Immunization History  Administered Date(s) Administered   DT (Pediatric) 10/11/2014   DTaP 03/18/2004   Influenza Inj Mdck Quad  11/17/2016   Influenza Split 11/15/2013, 11/30/2014   Influenza Whole 11/02/2012   Influenza,inj,Quad PF,6+ Mos 10/21/2017, 09/30/2018   Influenza 10/31/2015, 10/21/2017, 11/27/2019   Moderna SARS-COV2 Booster  05/26/2020   Moderna Sars-Covid-2 Vacc 04/15/2019, 05/19/2019   PFIZER SARS-COV-2 Vacc 01/01/2020   PPD Test 12/12/2017, 12/27/2018, 01/08/2020   Pneumococcal-13 11/15/2013, 11/17/2016   Pneumococcal-23 05/16/1996   Zoster Recombinat (Shingrix) 01/02/2018, 04/05/2018, 04/19/2018    Last Colon -  01/28/2015 - Dr Earlean Shawl - Recc 10 yr f/u - due Jan 2027    Last MGM - 10 / 06 /2022   Past Surgical History:  Procedure Laterality Date   ABDOMINAL HYSTERECTOMY     repair of trigger finger      Social History   Tobacco Use   Smoking status: Never   Smokeless tobacco: Never  Substance Use Topics   Alcohol use: Yes    Comment: very rarely   Drug use: No      ROS Constitutional: Denies fever, chills, weight loss/gain, headaches, insomnia,  night sweats, and change in appetite. Does c/o fatigue. Eyes: Denies redness, blurred vision, diplopia, discharge, itchy, watery eyes.  ENT: Denies discharge, congestion, post nasal drip, epistaxis, sore throat, earache, hearing loss, dental pain, Tinnitus, Vertigo, Sinus pain, snoring.  Cardio: Denies chest pain, palpitations, irregular heartbeat, syncope, dyspnea, diaphoresis, orthopnea, PND, claudication, edema Respiratory: denies cough, dyspnea, DOE, pleurisy, hoarseness, laryngitis, wheezing.  Gastrointestinal: Denies dysphagia, heartburn, reflux, water brash, pain, cramps, nausea, vomiting, bloating, diarrhea,  constipation, hematemesis, melena, hematochezia, jaundice, hemorrhoids Genitourinary: Denies dysuria, frequency, urgency, nocturia, hesitancy, discharge, hematuria, flank pain Breast: Breast lumps, nipple discharge, bleeding.  Musculoskeletal: Denies arthralgia, myalgia, stiffness, Jt. Swelling, pain, limp, and strain/sprain. Denies falls. Skin: Denies puritis, rash, hives, warts, acne, eczema, changing in skin lesion Neuro: No weakness, tremor, incoordination, spasms, paresthesia, pain Psychiatric: Denies confusion, memory loss, sensory loss. Denies Depression. Endocrine: Denies change in weight, skin, hair change, nocturia, and paresthesia, diabetic polys, visual  blurring, hyper / hypo glycemic episodes.  Heme/Lymph: No excessive bleeding, bruising, enlarged lymph nodes.  Physical Exam  BP 120/60   Pulse 95   Temp 97.9 F (36.6 C)   Resp 16   Ht '4\' 10"'$  (1.473 m)   Wt 120 lb 12.8 oz (54.8 kg)   SpO2 96%   BMI 25.25 kg/m   General Appearance: Well nourished, well groomed and in no apparent distress.  Eyes: PERRLA, EOMs, conjunctiva no swelling or erythema, normal fundi and vessels. Sinuses: No frontal/maxillary tenderness ENT/Mouth: EACs patent / TMs  nl. Nares clear without erythema, swelling, mucoid exudates. Oral hygiene is good. No erythema, swelling, or exudate. Tongue normal, non-obstructing. Tonsils not swollen or erythematous. Hearing normal.  Neck: Supple, thyroid not palpable. No bruits, nodes or JVD. Respiratory: Respiratory effort normal.  BS equal and clear bilateral without rales, rhonci, wheezing or stridor. Cardio: Heart sounds are normal with regular rate and rhythm and no murmurs, rubs or gallops. Peripheral pulses are normal and equal bilaterally without edema. No aortic or femoral bruits. Chest: symmetric with normal excursions and percussion. Breasts: Symmetric, without lumps, nipple discharge, retractions, or fibrocystic changes.  Abdomen: Flat, soft with bowel  sounds active. Nontender, no guarding, rebound, hernias, masses, or organomegaly.  Lymphatics: Non tender without lymphadenopathy.  Genitourinary:  Musculoskeletal: Full ROM all peripheral extremities, joint stability, 5/5 strength, and normal gait. Skin: Warm and dry without rashes, lesions, cyanosis, clubbing or  ecchymosis.  Neuro: Cranial nerves intact, reflexes equal bilaterally. Normal muscle tone, no cerebellar symptoms. Sensation intact.  Pysch: Alert and oriented X 3, normal affect, Insight and Judgment appropriate.    Assessment and Plan  1. Annual Preventative Screening Examination   2. Essential hypertension  - EKG 12-Lead - Urinalysis, Routine w reflex microscopic - Microalbumin / creatinine urine ratio - CBC with Differential/Platelet - COMPLETE METABOLIC PANEL WITH GFR - Magnesium - TSH   3. Hyperlipidemia associated with type 2 diabetes mellitus ([place])  - EKG 12-Lead - Lipid panel - TSH   4. Type 2 diabetes mellitus with stage 1 chronic kidney  disease, without long-term current use of insulin ([facility])  - EKG 12-Lead - Hemoglobin A1c - Insulin, random   5. Diabetic mononeuropathy associated with type 2 diabetes mellitus ([place])  - HM DIABETES FOOT EXAM - PR LOW EXTEMITY NEUR EXAM DOCUM   6. Screening for colorectal cancer  - POC Hemoccult Bld/Stl    7. Vitamin D deficiency  - VITAMIN D 25 Hydroxy      8. Gastroesophageal reflux disease  - CBC with Differential/Platelet   9. Screening for heart disease  - EKG 12-Lead   10. FHx: heart disease  - EKG 12-Lead   11. Medication management  - Urinalysis, Routine w reflex microscopic - Microalbumin / creatinine urine ratio - CBC with Differential/Platelet - COMPLETE METABOLIC PANEL WITH GFR - Magnesium - Lipid panel - TSH - Hemoglobin A1c - Insulin, random - VITAMIN D 25 Hydroxy            Patient was counseled in prudent diet to achieve/maintain BMI less than 25 for weight  control, BP monitoring, regular exercise and medications. Discussed med's effects and SE's. Screening labs and tests as requested with regular follow-up as recommended. Over 40 minutes of exam, counseling, chart review and high complex critical decision making was performed.   Kirtland Bouchard, MD

## 2022-02-17 NOTE — Patient Instructions (Signed)

## 2022-02-18 ENCOUNTER — Ambulatory Visit (INDEPENDENT_AMBULATORY_CARE_PROVIDER_SITE_OTHER): Payer: Medicare HMO | Admitting: Internal Medicine

## 2022-02-18 ENCOUNTER — Telehealth (HOSPITAL_COMMUNITY): Payer: Self-pay | Admitting: Emergency Medicine

## 2022-02-18 ENCOUNTER — Encounter: Payer: Self-pay | Admitting: Internal Medicine

## 2022-02-18 VITALS — BP 120/60 | HR 95 | Temp 97.9°F | Resp 16 | Ht <= 58 in | Wt 120.8 lb

## 2022-02-18 DIAGNOSIS — Z0000 Encounter for general adult medical examination without abnormal findings: Secondary | ICD-10-CM

## 2022-02-18 DIAGNOSIS — E1122 Type 2 diabetes mellitus with diabetic chronic kidney disease: Secondary | ICD-10-CM | POA: Diagnosis not present

## 2022-02-18 DIAGNOSIS — K219 Gastro-esophageal reflux disease without esophagitis: Secondary | ICD-10-CM

## 2022-02-18 DIAGNOSIS — I10 Essential (primary) hypertension: Secondary | ICD-10-CM | POA: Diagnosis not present

## 2022-02-18 DIAGNOSIS — E1169 Type 2 diabetes mellitus with other specified complication: Secondary | ICD-10-CM | POA: Diagnosis not present

## 2022-02-18 DIAGNOSIS — E1141 Type 2 diabetes mellitus with diabetic mononeuropathy: Secondary | ICD-10-CM

## 2022-02-18 DIAGNOSIS — Z79899 Other long term (current) drug therapy: Secondary | ICD-10-CM

## 2022-02-18 DIAGNOSIS — Z8249 Family history of ischemic heart disease and other diseases of the circulatory system: Secondary | ICD-10-CM

## 2022-02-18 DIAGNOSIS — Z1211 Encounter for screening for malignant neoplasm of colon: Secondary | ICD-10-CM

## 2022-02-18 DIAGNOSIS — E559 Vitamin D deficiency, unspecified: Secondary | ICD-10-CM | POA: Diagnosis not present

## 2022-02-18 DIAGNOSIS — E663 Overweight: Secondary | ICD-10-CM

## 2022-02-18 DIAGNOSIS — Z0001 Encounter for general adult medical examination with abnormal findings: Secondary | ICD-10-CM

## 2022-02-18 DIAGNOSIS — Z136 Encounter for screening for cardiovascular disorders: Secondary | ICD-10-CM | POA: Diagnosis not present

## 2022-02-18 DIAGNOSIS — E785 Hyperlipidemia, unspecified: Secondary | ICD-10-CM | POA: Diagnosis not present

## 2022-02-18 DIAGNOSIS — N181 Chronic kidney disease, stage 1: Secondary | ICD-10-CM | POA: Diagnosis not present

## 2022-02-18 MED ORDER — PHENTERMINE HCL 37.5 MG PO TABS: ORAL_TABLET | ORAL | 1 refills | Status: DC

## 2022-02-18 NOTE — Telephone Encounter (Signed)
Spoke with patient , verifying identity with 2 identifiers.  Patient informed her covid test was negative.  Patient requesting hard copy of results.  Suggested my chart, patient declined.  Patient asked if she could come to department to get a copy.  Patient reports she is on her way.  Notified patient access staff.

## 2022-02-19 LAB — CBC WITH DIFFERENTIAL/PLATELET
Absolute Monocytes: 578 cells/uL (ref 200–950)
Basophils Absolute: 53 cells/uL (ref 0–200)
Basophils Relative: 0.7 %
Eosinophils Absolute: 91 cells/uL (ref 15–500)
Eosinophils Relative: 1.2 %
HCT: 41 % (ref 35.0–45.0)
Hemoglobin: 13.3 g/dL (ref 11.7–15.5)
Lymphs Abs: 2037 cells/uL (ref 850–3900)
MCH: 30.9 pg (ref 27.0–33.0)
MCHC: 32.4 g/dL (ref 32.0–36.0)
MCV: 95.1 fL (ref 80.0–100.0)
MPV: 11.7 fL (ref 7.5–12.5)
Monocytes Relative: 7.6 %
Neutro Abs: 4841 cells/uL (ref 1500–7800)
Neutrophils Relative %: 63.7 %
Platelets: 249 10*3/uL (ref 140–400)
RBC: 4.31 10*6/uL (ref 3.80–5.10)
RDW: 12.7 % (ref 11.0–15.0)
Total Lymphocyte: 26.8 %
WBC: 7.6 10*3/uL (ref 3.8–10.8)

## 2022-02-19 LAB — LIPID PANEL
Cholesterol: 100 mg/dL (ref <200)
HDL: 52 mg/dL (ref >=50)
LDL Cholesterol (Calc): 37 mg/dL (calc)
Non-HDL Cholesterol (Calc): 48 mg/dL (calc) (ref <130)
Total CHOL/HDL Ratio: 1.9 (calc) (ref <5.0)
Triglycerides: 40 mg/dL (ref <150)

## 2022-02-19 LAB — URINALYSIS, ROUTINE W REFLEX MICROSCOPIC
Bilirubin Urine: NEGATIVE
Hgb urine dipstick: NEGATIVE
Ketones, ur: NEGATIVE
Leukocytes,Ua: NEGATIVE
Nitrite: NEGATIVE
Protein, ur: NEGATIVE
Specific Gravity, Urine: 1.03 (ref 1.001–1.035)
pH: 5.5 (ref 5.0–8.0)

## 2022-02-19 LAB — COMPLETE METABOLIC PANEL WITH GFR
AG Ratio: 1.5 (calc) (ref 1.0–2.5)
ALT: 14 U/L (ref 6–29)
AST: 17 U/L (ref 10–35)
Albumin: 4.1 g/dL (ref 3.6–5.1)
Alkaline phosphatase (APISO): 70 U/L (ref 37–153)
BUN: 23 mg/dL (ref 7–25)
CO2: 28 mmol/L (ref 20–32)
Calcium: 8.9 mg/dL (ref 8.6–10.4)
Chloride: 100 mmol/L (ref 98–110)
Creat: 0.62 mg/dL (ref 0.50–1.05)
Globulin: 2.7 g/dL (calc) (ref 1.9–3.7)
Glucose, Bld: 134 mg/dL (ref 65–139)
Potassium: 3.8 mmol/L (ref 3.5–5.3)
Sodium: 138 mmol/L (ref 135–146)
Total Bilirubin: 0.2 mg/dL (ref 0.2–1.2)
Total Protein: 6.8 g/dL (ref 6.1–8.1)
eGFR: 98 mL/min/{1.73_m2} (ref >=60)

## 2022-02-19 LAB — HEMOGLOBIN A1C
Hgb A1c MFr Bld: 7.6 % of total Hgb — ABNORMAL HIGH (ref <5.7)
Mean Plasma Glucose: 171 mg/dL
eAG (mmol/L): 9.5 mmol/L

## 2022-02-19 LAB — TSH: TSH: 1.26 mIU/L (ref 0.40–4.50)

## 2022-02-19 LAB — MAGNESIUM: Magnesium: 1.7 mg/dL (ref 1.5–2.5)

## 2022-02-19 LAB — MICROALBUMIN / CREATININE URINE RATIO
Creatinine, Urine: 58 mg/dL (ref 20–275)
Microalb, Ur: <0.2 mg/dL

## 2022-02-19 LAB — INSULIN, RANDOM: Insulin: 18 u[IU]/mL

## 2022-02-19 LAB — VITAMIN D 25 HYDROXY (VIT D DEFICIENCY, FRACTURES): Vit D, 25-Hydroxy: 78 ng/mL (ref 30–100)

## 2022-02-20 NOTE — Progress Notes (Signed)
<><><><><><><><><><><><><><><><><><><><><><><><><><><><><><><><><> <><><><><><><><><><><><><><><><><><><><><><><><><><><><><><><><><> -   Test results slightly outside the reference range are not unusual. If there is anything important, I will review this with you,  otherwise it is considered normal test values.  If you have further questions,  please do not hesitate to contact me at the office or via My Chart.  <><><><><><><><><><><><><><><><><><><><><><><><><><><><><><><><><> <><><><><><><><><><><><><><><><><><><><><><><><><><><><><><><><><>  -  A1c = 7.6% - is still too high  - Goal is less than 5.7%    - Need to work harder on diet & Weight Loss  <><><><><><><><><><><><><><><><><><><><><><><><><><><><><><><><><>  -  Magnesium = 1.7 is    very  low- goal is betw 2.0 - 2.5,   - So..............[name]  Recommend that you take n Magnesium 500 mg tablet  3 x /day with meals   - also important to eat lots of  leafy green vegetables   - spinach - Kale - collards - greens - okra - asparagus - broccoli - quinoa - squash - almonds   - black, red, white beans  -  peas - green beans <><><><><><><><><><><><><><><><><><><><><><><><><><><><><><><><><>  - Total Chol = 100 - Excellent   - Very low risk for Heart Attack  / Stroke <><><><><><><><><><><><><><><><><><><><><><><><><><><><><><><><><>  - Vitamin D= 78  - Excellent - Please continue dose same  <><><><><><><><><><><><><><><><><><><><><><><><><><><><><><><><><>  - All Else - CBC - Kidneys - Electrolytes - Liver - Magnesium & Thyroid    - all  Normal / OK <><><><><><><><><><><><><><><><><><><><><><><><><><><><><><><><><> <><><><><><><><><><><><><><><><><><><><><><><><><><><><><><><><><>

## 2022-02-22 ENCOUNTER — Other Ambulatory Visit: Payer: Self-pay | Admitting: Nurse Practitioner

## 2022-02-22 DIAGNOSIS — G8929 Other chronic pain: Secondary | ICD-10-CM

## 2022-03-01 DIAGNOSIS — H53481 Generalized contraction of visual field, right eye: Secondary | ICD-10-CM | POA: Diagnosis not present

## 2022-03-01 DIAGNOSIS — H53482 Generalized contraction of visual field, left eye: Secondary | ICD-10-CM | POA: Diagnosis not present

## 2022-03-01 DIAGNOSIS — H53483 Generalized contraction of visual field, bilateral: Secondary | ICD-10-CM | POA: Diagnosis not present

## 2022-03-08 ENCOUNTER — Encounter: Payer: Self-pay | Admitting: Nurse Practitioner

## 2022-03-08 ENCOUNTER — Ambulatory Visit (INDEPENDENT_AMBULATORY_CARE_PROVIDER_SITE_OTHER): Payer: Medicare HMO | Admitting: Nurse Practitioner

## 2022-03-08 VITALS — BP 132/60 | HR 84 | Temp 98.1°F | Ht <= 58 in | Wt 122.6 lb

## 2022-03-08 DIAGNOSIS — I10 Essential (primary) hypertension: Secondary | ICD-10-CM

## 2022-03-08 DIAGNOSIS — K120 Recurrent oral aphthae: Secondary | ICD-10-CM

## 2022-03-08 MED ORDER — NYSTATIN 100000 UNIT/ML MT SUSP: 5.0000 mL | Freq: Three times a day (TID) | OROMUCOSAL | 0 refills | Status: DC

## 2022-03-08 NOTE — Patient Instructions (Signed)
Canker Sores  Canker sores are small, painful sores that develop inside the mouth. You can get one or more canker sores on the inside of your lips or cheeks, on your tongue, or anywhere inside your mouth. Canker sores cannot be passed from person to person (are not contagious). These sores are different from the sores that you may get on the outside of your lips (cold sores or fever blisters). What are the causes? The cause of this condition is not known. The condition may be passed down from a parent (genetic). What increases the risk? This condition is more likely to develop in: Females. People in their teens or 20s. Females who are having their menstrual period. People who are under a lot of emotional stress. People who do not get enough iron or B vitamins. People who do not take care of their mouth and teeth (have poor oral hygiene). People who have an injury inside the mouth, such as after having dental work or from chewing something hard. What are the signs or symptoms? Canker sores usually start as painful red bumps. Then they turn into small white, yellow, or gray sores that have red borders. The sores may be painful, and the pain may get worse when you eat or drink. In severe cases, along with the canker sore, symptoms may also include: Fever. Tiredness (fatigue). Swollen lymph nodes in your neck. How is this diagnosed? This condition may be diagnosed based on your symptoms and an exam of the inside of your mouth. If you get canker sores often or if they are very bad, you may have tests, such as: Blood tests to rule out possible causes. Swabbing a fluid sample from the sore to be tested for infection. Removing a small tissue sample from the sore (biopsy). How is this treated? Most canker sores go away without treatment in about 1 week. Home care is usually the only treatment that you will need. Over-the-counter medicines can relieve discomfort. If you have severe canker sores, your  health care provider may prescribe: Numbing ointment to relieve pain. Do not use products that contain benzocaine (including numbing gels) to treat teething or mouth pain in children who are younger than 2 years. These products may cause a rare but serious blood condition. Vitamins. Steroid medicines. These may be given as pills, mouth rinses, or gels. Antibiotic mouth rinse. Follow these instructions at home:  Apply, take, or use over-the-counter and prescription medicines only as told by your health care provider. These include vitamins and ointments. If you were prescribed an antibiotic mouth rinse, use it as told by your health care provider. Do not stop using the antibiotic even if your condition improves. Until the sores are healed: Do not drink coffee or citrus juices. Do not eat spicy or salty foods. Use a mild, over-the-counter mouth rinse as recommended by your health care provider. Take good care of your mouth and teeth (oral hygiene) by: Flossing your teeth every day. Brushing your teeth with a soft toothbrush twice each day. Contact a health care provider if: Your symptoms do not get better after 2 weeks. You also have a fever or swollen glands in your neck. You get canker sores often. You have a canker sore that is getting larger. You cannot eat or drink due to your canker sores. Summary Canker sores are small, painful sores that develop inside the mouth. Canker sores usually start as painful red bumps that turn into small white, yellow, or gray sores that have red   borders. The sores may be painful, and the pain may get worse when you eat or drink. Most canker sores clear up without treatment in about 1 week. Over-the-counter medicines can relieve discomfort. This information is not intended to replace advice given to you by your health care provider. Make sure you discuss any questions you have with your health care provider. Document Revised: 11/12/2020 Document Reviewed:  11/12/2020 Elsevier Patient Education  2023 [employer]  

## 2022-03-08 NOTE — Progress Notes (Signed)
Assessment and Plan:  Elizeth was seen today for mouth lesions.  Diagnoses and all orders for this visit:  Essential hypertension - continue medications, DASH diet, exercise and monitor at home. Call if greater than 130/80.   Canker sores oral Use Magic mouthwash three times a day Chew slowly and try to avoid re injury to the area If no improvement over the next 5-7 days notify the office -     magic mouthwash (nystatin, lidocaine, diphenhydrAMINE, alum & mag hydroxide) suspension; Swish and spit 5 mLs 3 (three) times daily.       Further disposition pending results of labs. Discussed med's effects and SE's.   Over 30 minutes of exam, counseling, chart review, and critical decision making was performed.   Future Appointments  Date Time Provider [place]  05/28/2022  9:30 AM Darrol Jump, NP [facility] None  09/03/2022 10:30 AM [name], MD [facility] None  02/22/2023 11:00 AM [name], MD [facility] None    ------------------------------------------------------------------------------------------------------------------   HPI BP 132/60   Pulse 84   Temp 98.1 F (36.7 C)   Ht '4\' 10"'$  (1.473 m)   Wt 122 lb 9.6 oz (55.6 kg)   SpO2 98%   BMI 25.62 kg/m   67 y.o.female presents for complaints of sore in her mouth for a week. There is an area on left cheek inside and one of lower lip.  The areas are irritating, not necessarily painful.  The areas are not draining.   BP is currently well controlled with Zestoretic 10/12.5 mg QD and Metoprolol 25 mg QD. Denies headaches, chest pain, shortness of breath and dizziness  BP Readings from Last 3 Encounters:  03/08/22 132/60  02/18/22 120/60  02/16/22 (!) 162/73    BMI is Body mass index is 25.62 kg/m. Wt Readings from Last 3 Encounters:  03/08/22 122 lb 9.6 oz (55.6 kg)  02/18/22 120 lb 12.8 oz (54.8 kg)  11/18/21 118 lb 3.2 oz (53.6 kg)    Past Medical History:  Diagnosis Date   Allergic rhinitis     ASTHMA    BICUSPID AORTIC VALVE    CEREBROVASCULAR DISEASE    DEGENERATIVE JOINT DISEASE    Diabetes mellitus    DJD (degenerative joint disease)    GERD    HSV-1 (herpes simplex virus 1) infection    HYPERLIPIDEMIA    Hypertension    Palpitation    Vitamin D deficiency      Allergies  Allergen Reactions   Naprosyn [Naproxen] Other (See Comments)    GI upset    Current Outpatient Medications on File Prior to Visit  Medication Sig   albuterol (VENTOLIN HFA) 108 (90 Base) MCG/ACT inhaler Use  1 to 2 inhalations  15 minutes  apart every 4 hours if needed to rescue asthma   aspirin EC 81 MG tablet Take 81 mg by mouth daily.   B Complex-C (SUPER B COMPLEX PO) Take 1 tablet by mouth daily. Takes every other day   Cholecalciferol (VITAMIN D PO) Take 2 capsules by mouth daily. Take 5000 units by mouth twice a day   cyclobenzaprine (FLEXERIL) 10 MG tablet TAKE 1/2 TO 1 TABLET AT BEDTIME FOR MUSCLE SPASM   Diclofenac Sodium 1.5 % SOLN Place a small amount of gel on the bilateral hands for hand arthritis   empagliflozin (JARDIANCE) 25 MG TABS tablet Take 1 tab prior to breakfast daily for diabetes.   ferrous sulfate 325 (65 FE) MG tablet Take by mouth.   furosemide (LASIX)  20 MG tablet Take by mouth.   gabapentin (NEURONTIN) 600 MG tablet Take 1/2 to 1 tablet 2 to 3 x /Daily as needed for Neuropathy Pain   lisinopril-hydrochlorothiazide (ZESTORETIC) 10-12.5 MG tablet TAKE 1 TABLET DAILY FOR BLOOD PRESSURE AND DIABETIC KIDNEY PROTECTION   meloxicam (MOBIC) 15 MG tablet TAKE 1/2 TO 1 TABLET DAILY WITH FOOD FOR PAIN AND INFLAMMATION   metFORMIN (GLUCOPHAGE-XR) 500 MG 24 hr tablet TAKE 2 TABLETS TWICE DAILY WITH MEALS   metoprolol succinate (TOPROL-XL) 25 MG 24 hr tablet TAKE 1 TABLET EVERY DAY FOR BLOOD PRESSURE   Multiple Vitamin (MULITIVITAMIN WITH MINERALS) TABS Take 1 tablet by mouth daily.   pantoprazole (PROTONIX) 40 MG tablet TAKE 1 TABLET BY MOUTH ONCE DAILY TO  PREVENT   INDIGESTION  AND  HEARTBURN   phentermine (ADIPEX-P) 37.5 MG tablet Take  1/2 to 1 tablet  every Morning  for Dieting & Weight Loss   potassium chloride SA (KLOR-CON M20) 20 MEQ tablet TAKE 1 TABLET BY MOUTH 3 TIMES A DAY AFTER MEALS   rOPINIRole (REQUIP) 0.5 MG tablet Take 1 tablet 3 x/ day for Restless Legs   rosuvastatin (CRESTOR) 20 MG tablet TAKE 1 TABLET EVERY DAY FOR CHOLESTEROL   triamcinolone cream (KENALOG) 0.1 % Apply 1 application topically 4 (four) times daily as needed.   mometasone (NASONEX) 50 MCG/ACT nasal spray Place 2 sprays into the nose daily. (Patient taking differently: Place 2 sprays into the nose as needed.)   No current facility-administered medications on file prior to visit.    ROS: all negative except above.   Physical Exam:  BP 132/60   Pulse 84   Temp 98.1 F (36.7 C)   Ht '4\' 10"'$  (1.473 m)   Wt 122 lb 9.6 oz (55.6 kg)   SpO2 98%   BMI 25.62 kg/m   General Appearance: Well nourished, in no apparent distress. Eyes: PERRLA, EOMs, conjunctiva no swelling or erythema Sinuses: No Frontal/maxillary tenderness ENT/Mouth: Ext aud canals clear, TMs without erythema, bulging. No erythema, swelling, or exudate on post pharynx.  Canker sore noted on inside of left cheek and bottom lip. Hearing normal.  Neck: Supple, thyroid normal.  Respiratory: Respiratory effort normal, BS equal bilaterally without rales, rhonchi, wheezing or stridor.  Cardio: RRR with no MRGs. Brisk peripheral pulses without edema.  Abdomen: Soft, + BS.  Non tender, no guarding, rebound, hernias, masses. Lymphatics: Non tender without lymphadenopathy.  Musculoskeletal: Full ROM, 5/5 strength, normal gait.  Skin: Warm, dry without rashes, lesions, ecchymosis.  Psych: Awake and oriented X 3, normal affect, Insight and Judgment appropriate.     Alycia Rossetti, NP 4:18 PM [facility]

## 2022-04-07 ENCOUNTER — Ambulatory Visit (INDEPENDENT_AMBULATORY_CARE_PROVIDER_SITE_OTHER): Payer: Medicare HMO

## 2022-04-07 ENCOUNTER — Encounter (HOSPITAL_COMMUNITY): Payer: Self-pay

## 2022-04-07 ENCOUNTER — Ambulatory Visit (HOSPITAL_COMMUNITY)
Admission: EM | Admit: 2022-04-07 | Discharge: 2022-04-07 | Disposition: A | Discharge status: Home / Self Care | Payer: Medicare HMO | Attending: Emergency Medicine | Admitting: Emergency Medicine

## 2022-04-07 DIAGNOSIS — E1122 Type 2 diabetes mellitus with diabetic chronic kidney disease: Secondary | ICD-10-CM | POA: Insufficient documentation

## 2022-04-07 DIAGNOSIS — J069 Acute upper respiratory infection, unspecified: Secondary | ICD-10-CM

## 2022-04-07 DIAGNOSIS — R059 Cough, unspecified: Secondary | ICD-10-CM

## 2022-04-07 DIAGNOSIS — N181 Chronic kidney disease, stage 1: Secondary | ICD-10-CM

## 2022-04-07 DIAGNOSIS — I10 Essential (primary) hypertension: Secondary | ICD-10-CM | POA: Insufficient documentation

## 2022-04-07 DIAGNOSIS — R509 Fever, unspecified: Secondary | ICD-10-CM

## 2022-04-07 DIAGNOSIS — Z1152 Encounter for screening for COVID-19: Secondary | ICD-10-CM | POA: Diagnosis not present

## 2022-04-07 LAB — SARS CORONAVIRUS 2 (TAT 6-24 HRS): SARS Coronavirus 2: NEGATIVE

## 2022-04-07 NOTE — ED Triage Notes (Signed)
Pt is here for nasal congestion, chills, low energy, lost appetite x3days

## 2022-04-07 NOTE — Discharge Instructions (Addendum)
Your chest x-ray was clear and did not show signs of pneumonia.  Your symptoms are consistent with a viral upper respiratory infection.  We have swabbed you for COVID-19 and we will call if the results are positive.  Please ensure you are taking Tylenol and ibuprofen, you can alternate every 4-6 hours for fever, body aches and pains.  Please stay hydrated and drink at least 64 ounces of water daily.  You can take over-the-counter Mucinex to help with congestion.  Please return to clinic or follow-up with your primary care if no improvement within the week, and seek immediate care if you develop shortness of breath, chest pain, or worsening of condition.

## 2022-04-07 NOTE — ED Provider Notes (Signed)
[place]    CSN: [id number] Arrival date & time: 04/07/22  [id number]      History   Chief Complaint No chief complaint on file.   HPI [name] is a 67 y.o. female.   Patient reports fatigue, non-productive cough, hot and cold chills, and sore throat, over the past 3 days. Denies recent known sick contacts. Denies body aches. Denies CP, reports SOB with coughing spell, uses albuterol inhaler as needed for this, this helps. Did have nausea Sunday, no emesis.   Patient is diabetic, last A1c obtained in January was 7.6,  GFR of 98 and creatinine of 0.62.   The history is provided by the patient.    Past Medical History:  Diagnosis Date   Allergic rhinitis    ASTHMA    BICUSPID AORTIC VALVE    CEREBROVASCULAR DISEASE    DEGENERATIVE JOINT DISEASE    Diabetes mellitus    DJD (degenerative joint disease)    GERD    HSV-1 (herpes simplex virus 1) infection    HYPERLIPIDEMIA    Hypertension    Palpitation    Vitamin D deficiency     Patient Active Problem List   Diagnosis Date Noted   BMI 23.0-23.9, adult 04/21/2020   Degeneration of lumbar or lumbosacral intervertebral disc 10/10/2019   Type 2 diabetes with stage 1 chronic kidney disease GFR>90 ([place]) 04/22/2015   Esotropia of right eye 04/22/2015   Palpitations 04/11/2015   Abnormal electrocardiogram 04/11/2015   Sleep disorder, shift-work 10/13/2014   Left carotid bruit 02/14/2014   Medication management 02/14/2014   Essential hypertension 07/27/2013   Hyperlipidemia associated with type 2 diabetes mellitus ([place]) 02/01/2013   Vitamin D deficiency 02/01/2013   BICUSPID AORTIC VALVE 07/03/2008   Cerebrovascular disease 07/02/2008   Asthma 07/02/2008   GERD 07/02/2008   Osteoarthritis 07/02/2008    Past Surgical History:  Procedure Laterality Date   ABDOMINAL HYSTERECTOMY     repair of trigger finger      OB History   No obstetric history on file.      Home Medications    Prior  to Admission medications   Medication Sig Start Date End Date Taking? Authorizing Provider  aspirin EC 81 MG tablet Take 81 mg by mouth daily.   Yes [provider]  B Complex-C (SUPER B COMPLEX PO) Take 1 tablet by mouth daily. Takes every other day   Yes [provider]  Cholecalciferol (VITAMIN D PO) Take 2 capsules by mouth daily. Take 5000 units by mouth twice a day   Yes [provider]  cyclobenzaprine (FLEXERIL) 10 MG tablet TAKE 1/2 TO 1 TABLET AT BEDTIME FOR MUSCLE SPASM 02/22/22  Yes Alycia Rossetti, NP  Diclofenac Sodium 1.5 % SOLN Place a small amount of gel on the bilateral hands for hand arthritis 02/19/16  Yes Rolene Course, PA-C  empagliflozin (JARDIANCE) 25 MG TABS tablet Take 1 tab prior to breakfast daily for diabetes. 06/12/21  Yes Liane Comber, NP  furosemide (LASIX) 20 MG tablet Take by mouth. 03/19/16  Yes [provider]  gabapentin (NEURONTIN) 600 MG tablet Take 1/2 to 1 tablet 2 to 3 x /Daily as needed for Neuropathy Pain 02/12/21  Yes [name], MD  lisinopril-hydrochlorothiazide (ZESTORETIC) 10-12.5 MG tablet TAKE 1 TABLET DAILY FOR BLOOD PRESSURE AND DIABETIC KIDNEY PROTECTION 01/14/22  Yes Alycia Rossetti, NP  meloxicam (MOBIC) 15 MG tablet TAKE 1/2 TO 1 TABLET DAILY WITH FOOD FOR PAIN AND INFLAMMATION 01/21/22  Yes Alycia Rossetti, NP  metFORMIN (GLUCOPHAGE-XR) 500 MG 24 hr tablet TAKE 2 TABLETS TWICE DAILY WITH MEALS 08/26/21  Yes Alycia Rossetti, NP  metoprolol succinate (TOPROL-XL) 25 MG 24 hr tablet TAKE 1 TABLET EVERY DAY FOR BLOOD PRESSURE 08/26/21  Yes Alycia Rossetti, NP  Multiple Vitamin (MULITIVITAMIN WITH MINERALS) TABS Take 1 tablet by mouth daily.   Yes [provider]  pantoprazole (PROTONIX) 40 MG tablet TAKE 1 TABLET BY MOUTH ONCE DAILY TO  PREVENT  INDIGESTION  AND  HEARTBURN 02/04/22  Yes Alycia Rossetti, NP  phentermine (ADIPEX-P) 37.5 MG tablet Take  1/2 to 1 tablet  every Morning  for  Dieting & Weight Loss 02/18/22  Yes [name], MD  potassium chloride SA (KLOR-CON M20) 20 MEQ tablet TAKE 1 TABLET BY MOUTH 3 TIMES A DAY AFTER MEALS 01/15/21  Yes [name], MD  rOPINIRole (REQUIP) 0.5 MG tablet Take 1 tablet 3 x/ day for Restless Legs 11/29/21  Yes [name], MD  rosuvastatin (CRESTOR) 20 MG tablet TAKE 1 TABLET EVERY DAY FOR CHOLESTEROL 08/26/21  Yes Alycia Rossetti, NP  albuterol (VENTOLIN HFA) 108 (90 Base) MCG/ACT inhaler Use  1 to 2 inhalations  15 minutes  apart every 4 hours if needed to rescue asthma 10/10/20 10/31/22  [name], MD  ferrous sulfate 325 (65 FE) MG tablet Take by mouth.    [provider]  mometasone (NASONEX) 50 MCG/ACT nasal spray Place 2 sprays into the nose daily. Patient taking differently: Place 2 sprays into the nose as needed. 06/30/18 06/11/21  [name], MD    Family History Family History  Problem Relation Age of Onset   Coronary artery disease Mother    Heart disease Mother    Hypertension Mother    Diabetes Father    Hypertension Father    Breast cancer Maternal Aunt     Social History Social History   Tobacco Use   Smoking status: Never   Smokeless tobacco: Never  Vaping Use   Vaping Use: Never used  Substance Use Topics   Alcohol use: Not Currently   Drug use: No     Allergies   Naprosyn [naproxen]   Review of Systems Review of Systems  Constitutional:  Positive for chills, diaphoresis and fatigue.  HENT:  Positive for sore throat.   Respiratory:  Positive for cough and shortness of breath. Negative for chest tightness and wheezing.   Cardiovascular:  Negative for chest pain.  Gastrointestinal:  Positive for nausea. Negative for abdominal pain, diarrhea and vomiting.     Physical Exam Triage Vital Signs ED Triage Vitals  Enc Vitals Group     BP 04/07/22 0841 135/77     Pulse Rate 04/07/22 0841 83     Resp 04/07/22 0841 12     Temp 04/07/22 0841 98.4 F (36.9 C)      Temp Source 04/07/22 0841 Oral     SpO2 04/07/22 0841 97 %     Weight --      Height --      Head Circumference --      Peak Flow --      Pain Score 04/07/22 0843 0     Pain Loc --      Pain Edu? --      Excl. in [place]? --    No data found.  Updated Vital Signs BP 135/77 (BP Location: Left Arm)   Pulse 83   Temp 98.4 F (36.9  C) (Oral)   Resp 12   SpO2 97%   Visual Acuity Right Eye Distance:   Left Eye Distance:   Bilateral Distance:    Right Eye Near:   Left Eye Near:    Bilateral Near:     Physical Exam Vitals and nursing note reviewed.  Constitutional:      General: She is not in acute distress.    Appearance: Normal appearance. She is well-developed.     Comments: Pleasant 67 year old female who appears stated age.  HENT:     Head: Normocephalic and atraumatic.     Right Ear: External ear normal.     Left Ear: External ear normal.     Mouth/Throat:     Mouth: Mucous membranes are moist.     Pharynx: Uvula midline. Posterior oropharyngeal erythema present. No oropharyngeal exudate or uvula swelling.     Tonsils: No tonsillar exudate or tonsillar abscesses.  Eyes:     Conjunctiva/sclera: Conjunctivae normal.  Cardiovascular:     Rate and Rhythm: Normal rate and regular rhythm.     Heart sounds: Normal heart sounds, S1 normal and S2 normal. No murmur heard. Pulmonary:     Effort: Pulmonary effort is normal. No respiratory distress.     Breath sounds: Wheezing present.     Comments: Wheezing to right upper lobe and diminished lung sounds in lower lobes bilaterally. Abdominal:     Palpations: Abdomen is soft.     Tenderness: There is no abdominal tenderness.  Musculoskeletal:        General: No swelling.     Cervical back: Neck supple.  Skin:    General: Skin is warm and dry.     Capillary Refill: Capillary refill takes less than 2 seconds.  Neurological:     Mental Status: She is alert.  Psychiatric:        Mood and Affect: Mood normal.        Behavior:  Behavior is cooperative.      UC Treatments / Results  Labs (all labs ordered are listed, but only abnormal results are displayed) Labs Reviewed  SARS CORONAVIRUS 2 (TAT 6-24 HRS)    EKG   Radiology DG Chest 2 View  Result Date: 04/07/2022 CLINICAL DATA:  67 year old female with cough, fever, chills. EXAM: CHEST - 2 VIEW COMPARISON:  07/08/2020 FINDINGS: The mediastinal contours are within normal limits. No cardiomegaly. The lungs are clear bilaterally without evidence of focal consolidation, pleural effusion, or pneumothorax. No acute osseous abnormality. IMPRESSION: No acute cardiopulmonary process. Electronically Signed   By: Ruthann Cancer M.D.   On: 04/07/2022 09:28    Procedures Procedures (including critical care time)  Medications Ordered in UC Medications - No data to display  Initial Impression / Assessment and Plan / UC Course  I have reviewed the triage vital signs and the nursing notes.  Pertinent labs & imaging results that were available during my care of the patient were reviewed by me and considered in my medical decision making (see chart for details).  Vital signs and nursing note reviewed, patient is hemodynamically stable.  Discussed due to symptom presentation this is most likely viral, however due to her age and multiple comorbidities will obtain chest x-ray.  Wheezing in right upper lobe and diminished lung sounds in bilateral lower lobes.  Imaging with no  acute cardiopulmonary processes.  Tested for COVID-19, patient is candidate for antiviral if positive.  Plan of care discussed, return precautions given, patient verbalized understanding.  Final Clinical Impressions(s) / UC Diagnoses   Final diagnoses:  Viral upper respiratory tract infection with cough  Essential hypertension  Type 2 diabetes mellitus with stage 1 chronic kidney disease, without long-term current use of insulin [facility])     Discharge Instructions      Your chest x-ray was clear  and did not show signs of pneumonia.  Your symptoms are consistent with a viral upper respiratory infection.  We have swabbed you for COVID-19 and we will call if the results are positive.  Please ensure you are taking Tylenol and ibuprofen, you can alternate every 4-6 hours for fever, body aches and pains.  Please stay hydrated and drink at least 64 ounces of water daily.  You can take over-the-counter Mucinex to help with congestion.  Please return to clinic or follow-up with your primary care if no improvement within the week, and seek immediate care if you develop shortness of breath, chest pain, or worsening of condition.     ED Prescriptions   None    I have reviewed the PDMP during this encounter.   Louretta Shorten [place] [address], [place] 04/07/22 [id number]

## 2022-04-17 ENCOUNTER — Other Ambulatory Visit: Payer: Self-pay | Admitting: Nurse Practitioner

## 2022-04-17 DIAGNOSIS — G8929 Other chronic pain: Secondary | ICD-10-CM

## 2022-04-20 DIAGNOSIS — H02024 Mechanical entropion of left upper eyelid: Secondary | ICD-10-CM | POA: Diagnosis not present

## 2022-04-20 DIAGNOSIS — T8132XS Disruption of internal operation (surgical) wound, not elsewhere classified, sequela: Secondary | ICD-10-CM | POA: Diagnosis not present

## 2022-04-20 DIAGNOSIS — H02021 Mechanical entropion of right upper eyelid: Secondary | ICD-10-CM | POA: Diagnosis not present

## 2022-05-07 ENCOUNTER — Ambulatory Visit
Admission: RE | Admit: 2022-05-07 | Discharge: 2022-05-07 | Disposition: A | Discharge status: Home / Self Care | Payer: Medicare HMO | Source: Ambulatory Visit | Attending: Nurse Practitioner | Admitting: Nurse Practitioner

## 2022-05-07 DIAGNOSIS — E2839 Other primary ovarian failure: Secondary | ICD-10-CM

## 2022-05-07 DIAGNOSIS — Z780 Asymptomatic menopausal state: Secondary | ICD-10-CM | POA: Diagnosis not present

## 2022-05-08 ENCOUNTER — Other Ambulatory Visit: Payer: Self-pay | Admitting: Internal Medicine

## 2022-05-08 DIAGNOSIS — E1340 Other specified diabetes mellitus with diabetic neuropathy, unspecified: Secondary | ICD-10-CM

## 2022-05-10 ENCOUNTER — Other Ambulatory Visit: Payer: Self-pay | Admitting: Nurse Practitioner

## 2022-05-28 ENCOUNTER — Ambulatory Visit (INDEPENDENT_AMBULATORY_CARE_PROVIDER_SITE_OTHER): Payer: Medicare HMO | Admitting: Nurse Practitioner

## 2022-05-28 VITALS — BP 140/80 | HR 94 | Temp 97.9°F | Ht <= 58 in | Wt 122.2 lb

## 2022-05-28 DIAGNOSIS — Z79899 Other long term (current) drug therapy: Secondary | ICD-10-CM

## 2022-05-28 DIAGNOSIS — I10 Essential (primary) hypertension: Secondary | ICD-10-CM | POA: Diagnosis not present

## 2022-05-28 DIAGNOSIS — K219 Gastro-esophageal reflux disease without esophagitis: Secondary | ICD-10-CM | POA: Diagnosis not present

## 2022-05-28 DIAGNOSIS — E785 Hyperlipidemia, unspecified: Secondary | ICD-10-CM

## 2022-05-28 DIAGNOSIS — E1340 Other specified diabetes mellitus with diabetic neuropathy, unspecified: Secondary | ICD-10-CM | POA: Diagnosis not present

## 2022-05-28 DIAGNOSIS — M79671 Pain in right foot: Secondary | ICD-10-CM

## 2022-05-28 DIAGNOSIS — Z0000 Encounter for general adult medical examination without abnormal findings: Secondary | ICD-10-CM

## 2022-05-28 DIAGNOSIS — E1169 Type 2 diabetes mellitus with other specified complication: Secondary | ICD-10-CM | POA: Diagnosis not present

## 2022-05-28 DIAGNOSIS — E559 Vitamin D deficiency, unspecified: Secondary | ICD-10-CM

## 2022-05-28 DIAGNOSIS — E1122 Type 2 diabetes mellitus with diabetic chronic kidney disease: Secondary | ICD-10-CM | POA: Diagnosis not present

## 2022-05-28 DIAGNOSIS — R6889 Other general symptoms and signs: Secondary | ICD-10-CM

## 2022-05-28 DIAGNOSIS — N181 Chronic kidney disease, stage 1: Secondary | ICD-10-CM | POA: Diagnosis not present

## 2022-05-28 DIAGNOSIS — Z0001 Encounter for general adult medical examination with abnormal findings: Secondary | ICD-10-CM

## 2022-05-28 DIAGNOSIS — J302 Other seasonal allergic rhinitis: Secondary | ICD-10-CM

## 2022-05-28 DIAGNOSIS — J45909 Unspecified asthma, uncomplicated: Secondary | ICD-10-CM | POA: Diagnosis not present

## 2022-05-28 DIAGNOSIS — Z1231 Encounter for screening mammogram for malignant neoplasm of breast: Secondary | ICD-10-CM

## 2022-05-28 DIAGNOSIS — E663 Overweight: Secondary | ICD-10-CM | POA: Diagnosis not present

## 2022-05-28 DIAGNOSIS — R52 Pain, unspecified: Secondary | ICD-10-CM

## 2022-05-28 NOTE — Progress Notes (Signed)
ANNUAL MEDICARE WELLNESS VISIT AND FOLLOW UP  Assessment:   [name] was seen today for follow-up and medicare wellness.  Diagnoses and all orders for this visit:   Encounter for Medicare Annual Wellness Due Yearly Does mammogram yearly will call to schedule  Essential hypertension Discussed DASH (Dietary Approaches to Stop Hypertension) DASH diet is lower in sodium than a typical American diet. Cut back on foods that are high in saturated fat, cholesterol, and trans fats. Eat more whole-grain foods, fish, poultry, and nuts Remain active and exercise as tolerated daily.  Monitor BP at home-Call if greater than 130/80.  Check CMP/CBC   Hyperlipidemia associated with type 2 diabetes mellitus (HCC) Discussed lifestyle modifications. Recommended diet heavy in fruits and veggies, omega 3's. Decrease consumption of animal meats, cheeses, and dairy products. Remain active and exercise as tolerated. Continue to monitor. Check lipids/TSH   Type 2 diabetes mellitus with stage 1 chronic kidney disease, without long-term current use of insulin [facility]) Education: Reviewed 'ABCs' of diabetes management  Discussed goals to be met and/or maintained include A1C (<7) Blood pressure (<130/80) Cholesterol (LDL <70) Continue Eye Exam yearly  Continue Dental Exam Q6 mo Discussed dietary recommendations Discussed Physical Activity recommendations Foot exam UTD Check A1C  Vitamin D deficiency Continue supplement for goal of 60-100 Monitor Vitamin D levels  Medication management All medications discussed and reviewed in full. All questions and concerns regarding medications addressed.    Gastroesophageal reflux disease, unspecified whether esophagitis present No suspected reflux complications (Barret/stricture). Lifestyle modification:  wt loss, avoid meals 2-3h before bedtime. Consider eliminating food triggers:  chocolate, caffeine, EtOH, acid/spicy food.  Overweight Discussed  appropriate BMI Diet modification. Physical activity. Encouraged/praised to build confidence.  Neuropathy due to secondary diabetes (HCC) Continue medications Inspect feet daily Control BG A1c goal <7%  Uncomplicated Asthma  Continue use of Albuterol inhaler as needed and monitor symptoms  Seasonal allergies Continue OTC antihistamine Avoid triggers Continue to monitor  Pain of right heel/pain aggravated by walking Discussed possible right heel spur Suggested brace X-ray pending Continue to monitor  Screening for breast cancer Mammogram ordered  Orders Placed This Encounter  Procedures   DG Os Calcis Right    Standing Status:   Future    Standing Expiration Date:   05/28/2023    Order Specific Question:   Reason for Exam (SYMPTOM  OR DIAGNOSIS REQUIRED)    Answer:   right heel pain when walking    Order Specific Question:   Preferred imaging location?    Answer:   [facility]   MM Digital Screening    Standing Status:   Future    Standing Expiration Date:   06/06/2023    Order Specific Question:   Reason for Exam (SYMPTOM  OR DIAGNOSIS REQUIRED)    Answer:   Screening mammogram    Order Specific Question:   Preferred imaging location?    Answer:   [facility]   CBC with Differential/Platelet   COMPLETE METABOLIC PANEL WITH GFR   Lipid panel   Hemoglobin A1c   VITAMIN D 25 Hydroxy (Vit-D Deficiency, Fractures)   Notify office for further evaluation and treatment, questions or concerns if any reported s/s fail to improve.   The patient was advised to call back or seek an in-person evaluation if any symptoms worsen or if the condition fails to improve as anticipated.   Further disposition pending results of labs. Discussed med's effects and SE's.    I discussed the assessment and treatment plan  with the patient. The patient was provided an opportunity to ask questions and all were answered. The patient agreed with the plan and demonstrated an  understanding of the instructions.  Discussed med's effects and SE's. Screening labs and tests as requested with regular follow-up as recommended.  I provided 35 minutes of face-to-face time during this encounter including counseling, chart review, and critical decision making was preformed.  Today's Plan of Care is based on a patient-centered health care approach known as shared decision making - the decisions, tests and treatments allow for patient preferences and values to be balanced with clinical evidence.    Future Appointments  Date Time Provider Department Center  09/03/2022 10:30 AM Lucky Cowboy, MD [facility] None  02/22/2023 11:00 AM Lucky Cowboy, MD [facility] None  05/30/2023 11:00 AM Adela Glimpse, NP [facility] None     Plan:   During the course of the visit the patient was educated and counseled about appropriate screening and preventive services including:   Pneumococcal vaccine  Prevnar 13 Influenza vaccine Td vaccine Screening electrocardiogram Bone densitometry screening Colorectal cancer screening Diabetes screening Glaucoma screening Nutrition counseling  Advanced directives: requested   Subjective:  [name] is a 67 y.o. female who presents for Medicare Annual Wellness Visit and 3 month follow up.   Overall she reports feeling well today.    She does note right heel pain that is worse when walking.  No known injury, fall, trauma.    BMI is Body mass index is 25.54 kg/m., she has been working on diet and exercise. Weight remains stable.  Wt Readings from Last 3 Encounters:  05/28/22 122 lb 3.2 oz (55.4 kg)  03/08/22 122 lb 9.6 oz (55.6 kg)  02/18/22 120 lb 12.8 oz (54.8 kg)    Her blood pressure has been controlled at home, today their BP is BP: (!) 140/80 BP Readings from Last 3 Encounters:  05/28/22 (!) 140/80  04/07/22 135/77  03/08/22 132/60   She does workout. She denies chest pain, shortness of breath, dizziness.    She is on cholesterol medication and denies myalgias. Her cholesterol is at goal. The cholesterol last visit was:   Lab Results  Component Value Date   CHOL 95 05/28/2022   HDL 49 (L) 05/28/2022   LDLCALC 33 05/28/2022   TRIG 44 05/28/2022   CHOLHDL 1.9 05/28/2022    She has been working on diet and exercise for type 2 diabetes . Has started to get more parasthesias in hand and feet. Last A1C in the office was:  Lab Results  Component Value Date   HGBA1C 7.9 (H) 05/28/2022   Pt has noticed her asthma has had more flares, inhalers relieves shortness of breath. She has been having more congestion and believes it is related to allergies  She is currently on Protonix daily for GERD and continues to use Famotidine for breakthrough symptoms.  Last GFR:   Lab Results  Component Value Date   EGFR 105 05/28/2022     Patient is on Vitamin D supplement.   Lab Results  Component Value Date   VD25OH 86 05/28/2022      Medication Review: Current Outpatient Medications on File Prior to Visit  Medication Sig Dispense Refill   albuterol (VENTOLIN HFA) 108 (90 Base) MCG/ACT inhaler Use  1 to 2 inhalations  15 minutes  apart every 4 hours if needed to rescue asthma 48 g 3   aspirin EC 81 MG tablet Take 81 mg by mouth daily.  B Complex-C (SUPER B COMPLEX PO) Take 1 tablet by mouth daily. Takes every other day     Cholecalciferol (VITAMIN D PO) Take 2 capsules by mouth daily. Take 5000 units by mouth twice a day     cyclobenzaprine (FLEXERIL) 10 MG tablet TAKE 1/2 TO 1 TABLET AT BEDTIME FOR MUSCLE SPASM 60 tablet 1   Diclofenac Sodium 1.5 % SOLN Place a small amount of gel on the bilateral hands for hand arthritis 150 mL 0   empagliflozin (JARDIANCE) 25 MG TABS tablet Take 1 tab prior to breakfast daily for diabetes. 90 tablet 3   ferrous sulfate 325 (65 FE) MG tablet Take by mouth.     gabapentin (NEURONTIN) 600 MG tablet TAKE 1/2 TO 1 TABLET 2 TO 3 TIMES DAILY AS NEEDED FOR NEUROPATHY  PAIN 270 tablet 3   lisinopril-hydrochlorothiazide (ZESTORETIC) 10-12.5 MG tablet TAKE 1 TABLET DAILY FOR BLOOD PRESSURE AND DIABETIC KIDNEY PROTECTION 90 tablet 3   meloxicam (MOBIC) 15 MG tablet TAKE 1/2 TO 1 TABLET DAILY WITH FOOD FOR PAIN AND INFLAMMATION 90 tablet 3   metFORMIN (GLUCOPHAGE-XR) 500 MG 24 hr tablet TAKE 2 TABLETS TWICE DAILY WITH MEALS 360 tablet 3   metoprolol succinate (TOPROL-XL) 25 MG 24 hr tablet TAKE 1 TABLET EVERY DAY FOR BLOOD PRESSURE 90 tablet 3   mometasone (NASONEX) 50 MCG/ACT nasal spray Place 2 sprays into the nose daily. (Patient taking differently: Place 2 sprays into the nose as needed.) 51 g 3   Multiple Vitamin (MULITIVITAMIN WITH MINERALS) TABS Take 1 tablet by mouth daily.     pantoprazole (PROTONIX) 40 MG tablet TAKE 1 TABLET BY MOUTH ONCE DAILY TO PREVENT INDIGESTION AND HEARTBURN 90 tablet 0   phentermine (ADIPEX-P) 37.5 MG tablet Take  1/2 to 1 tablet  every Morning  for Dieting & Weight Loss 90 tablet 1   potassium chloride SA (KLOR-CON M) 20 MEQ tablet Take 1 tablet 3 x /day with Meals for Potassium                                                                /                                           TAKE                                      BY                                       MOUTH 270 tablet 3   rOPINIRole (REQUIP) 0.5 MG tablet Take 1 tablet 3 x/ day for Restless Legs 270 tablet 3   rosuvastatin (CRESTOR) 20 MG tablet TAKE 1 TABLET EVERY DAY FOR CHOLESTEROL 90 tablet 3   No current facility-administered medications on file prior to visit.    Allergies  Allergen Reactions   Naprosyn [Naproxen] Other (See Comments)    GI upset    Current Problems (verified) Patient Active Problem  List   Diagnosis Date Noted   BMI 23.0-23.9, adult 04/21/2020   Degeneration of lumbar or lumbosacral intervertebral disc 10/10/2019   Type 2 diabetes with stage 1 chronic kidney disease GFR>90 04/22/2015   Esotropia of right eye 04/22/2015   Palpitations  04/11/2015   Abnormal electrocardiogram 04/11/2015   Sleep disorder, shift-work 10/13/2014   Left carotid bruit 02/14/2014   Medication management 02/14/2014   Essential hypertension 07/27/2013   Hyperlipidemia associated with type 2 diabetes mellitus 02/01/2013   Vitamin D deficiency 02/01/2013   BICUSPID AORTIC VALVE 07/03/2008   Cerebrovascular disease 07/02/2008   Asthma 07/02/2008   GERD 07/02/2008   Osteoarthritis 07/02/2008    Screening Tests Immunization History  Administered Date(s) Administered   Covid-19, Mrna,Vaccine(Spikevax)36yrs and older 12/02/2021   DT (Pediatric) 10/11/2014   DTaP 03/18/2004   Influenza Inj Mdck Quad With Preservative 11/17/2016   Influenza Split 11/15/2013, 11/30/2014   Influenza Whole 11/02/2012   Influenza, High Dose Seasonal PF 11/18/2021   Influenza,inj,Quad PF,6+ Mos 10/21/2017, 09/30/2018   Influenza-Unspecified 10/31/2015, 10/21/2017, 11/27/2019   Moderna SARS-COV2 Booster Vaccination 05/26/2020   Moderna Sars-Covid-2 Vaccination 04/15/2019, 05/19/2019   PFIZER(Purple Top)SARS-COV-2 Vaccination 01/01/2020   PNEUMOCOCCAL CONJUGATE-20 11/18/2021   PPD Test 07/30/2013, 10/11/2014, 11/06/2015, 11/17/2016, 12/12/2017, 12/27/2018, 01/08/2020   Pneumococcal Conjugate-13 11/15/2013, 11/17/2016   Pneumococcal Polysaccharide-23 05/16/1996   Respiratory Syncytial Virus Vaccine,Recomb Aduvanted(Arexvy) 12/02/2021   Zoster Recombinat (Shingrix) 01/02/2018, 04/05/2018, 04/19/2018    Preventative care: Last colonoscopy: 2016 Dr. Kinnie Scales due 2026 Last mammogram: 11/2020 negative , order today Last pap smear/pelvic exam: Due with next physical   DEXA: 04/2022 -0.4 Normal   Prior vaccinations: TD or Tdap: 2016  Influenza: 11/2021 Pneumococcal: 11/2021 Prevnar13: 2018 Shingles/Zostavax:11/19, 2/20, 3/20  Names of Other Physician/Practitioners you currently use: 1. [facility] here for primary care 2. Dr.  Dimas Millin, eye doctor, last visit 2023 3. Dr. Maurice March, dentist, last visit 2023 Patient Care Team: Lucky Cowboy, MD as PCP - General (Internal Medicine)  SURGICAL HISTORY She  has a past surgical history that includes repair of trigger finger and Abdominal hysterectomy. FAMILY HISTORY Her family history includes Breast cancer in her maternal aunt; Coronary artery disease in her mother; Diabetes in her father; Heart disease in her mother; Hypertension in her father and mother. SOCIAL HISTORY She  reports that she has never smoked. She has never used smokeless tobacco. She reports that she does not currently use alcohol. She reports that she does not use drugs.   MEDICARE WELLNESS OBJECTIVES: Physical activity: Current Exercise Habits: Home exercise routine Cardiac risk factors:   Depression/mood screen:      05/28/2022    9:44 AM  Depression screen PHQ 2/9  Decreased Interest 0  Down, Depressed, Hopeless 0  PHQ - 2 Score 0    ADLs:     05/28/2022    9:39 AM 02/17/2022   11:30 PM  In your present state of health, do you have any difficulty performing the following activities:  Hearing? 0 0  Vision? 0 0  Difficulty concentrating or making decisions?  0  Walking or climbing stairs? 0 0  Dressing or bathing? 0 0  Doing errands, shopping? 0 0  Preparing Food and eating ? N   Using the Toilet? N   In the past six months, have you accidently leaked urine? N   Do you have problems with loss of bowel control? N   Managing your Medications? N   Managing your Finances? N  Housekeeping or managing your Housekeeping? N      Cognitive Testing  Alert? Yes  Normal Appearance?Yes  Oriented to person? Yes  Place? Yes   Time? Yes  Recall of three objects?  Yes  Can perform simple calculations? Yes  Displays appropriate judgment?Yes  Can read the correct time from a watch face?Yes  EOL planning:    Review of Systems  Constitutional:  Negative for chills, fever and weight loss.   HENT:  Negative for congestion, hearing loss, sinus pain and sore throat.   Eyes:  Negative for blurred vision and double vision.  Respiratory:  Negative for cough and shortness of breath.   Cardiovascular:  Negative for chest pain, palpitations, orthopnea and leg swelling.  Gastrointestinal:  Positive for heartburn. Negative for abdominal pain, constipation, diarrhea, nausea and vomiting.  Musculoskeletal:  Negative for falls, joint pain and myalgias.  Skin:  Negative for rash.  Neurological:  Negative for dizziness, tingling, tremors, loss of consciousness and headaches.       Parasthesia of hands and feet  Psychiatric/Behavioral:  Negative for depression, memory loss and suicidal ideas.      Objective:     Today's Vitals   05/28/22 0910  BP: (!) 140/80  Pulse: 94  Temp: 97.9 F (36.6 C)  SpO2: 98%  Weight: 122 lb 3.2 oz (55.4 kg)  Height:  (1.473 m)   Body mass index is 25.54 kg/m.  General appearance: alert, no distress, WD/WN, female HEENT: normocephalic, sclerae anicteric, Right eye gaze permanently deviatedTMs pearly, nares patent, no discharge or erythema, pharynx normal Oral cavity: MMM, no lesions Neck: supple, no lymphadenopathy, no thyromegaly, no masses Heart: RRR, normal S1, S2, no murmurs Lungs: CTA bilaterally, no wheezes, rhonchi, or rales Abdomen: +bs, soft, non tender, non distended, no masses, no hepatomegaly, no splenomegaly Musculoskeletal: nontender, no swelling, no obvious deformity Extremities: no edema, no cyanosis, no clubbing Pulses: 2+ symmetric, upper and lower extremities, normal cap refill Neurological: alert, oriented x 3, CN2-12 intact, strength normal upper extremities and lower extremities, sensation normal throughout, DTRs 2+ throughout, no cerebellar signs, gait normal Psychiatric: normal affect, behavior normal, pleasant     Medicare Attestation I have personally reviewed: The patient's medical and social history Their use  of alcohol, tobacco or illicit drugs Their current medications and supplements, The patient's functional ability including ADLs,fall risks, home safety risks, cognitive, and hearing and visual impairment Diet and physical activities Evidence for depression or mood disorders  The patient's weight, height, BMI, and visual acuity have been recorded in the chart.  I have made referrals, counseling, and provided education to the patient based on review of the above and I have provided the patient with a written personalized care plan for preventive services.     Adela Glimpse, NP   06/06/2022

## 2022-05-29 LAB — LIPID PANEL
Cholesterol: 95 mg/dL (ref <200)
HDL: 49 mg/dL — ABNORMAL LOW (ref >=50)
LDL Cholesterol (Calc): 33 mg/dL (calc)
Non-HDL Cholesterol (Calc): 46 mg/dL (calc) (ref <130)
Total CHOL/HDL Ratio: 1.9 (calc) (ref <5.0)
Triglycerides: 44 mg/dL (ref <150)

## 2022-05-29 LAB — COMPLETE METABOLIC PANEL WITH GFR
AG Ratio: 1.5 (calc) (ref 1.0–2.5)
ALT: 18 U/L (ref 6–29)
AST: 13 U/L (ref 10–35)
Albumin: 4 g/dL (ref 3.6–5.1)
Alkaline phosphatase (APISO): 74 U/L (ref 37–153)
BUN/Creatinine Ratio: 33 (calc) — ABNORMAL HIGH (ref 6–22)
BUN: 15 mg/dL (ref 7–25)
CO2: 28 mmol/L (ref 20–32)
Calcium: 9.2 mg/dL (ref 8.6–10.4)
Chloride: 104 mmol/L (ref 98–110)
Creat: 0.46 mg/dL — ABNORMAL LOW (ref 0.50–1.05)
Globulin: 2.7 g/dL (calc) (ref 1.9–3.7)
Glucose, Bld: 112 mg/dL — ABNORMAL HIGH (ref 65–99)
Potassium: 4.2 mmol/L (ref 3.5–5.3)
Sodium: 141 mmol/L (ref 135–146)
Total Bilirubin: 0.2 mg/dL (ref 0.2–1.2)
Total Protein: 6.7 g/dL (ref 6.1–8.1)
eGFR: 105 mL/min/{1.73_m2} (ref >=60)

## 2022-05-29 LAB — CBC WITH DIFFERENTIAL/PLATELET
Absolute Monocytes: 518 cells/uL (ref 200–950)
Basophils Absolute: 50 cells/uL (ref 0–200)
Basophils Relative: 0.7 %
Eosinophils Absolute: 108 cells/uL (ref 15–500)
Eosinophils Relative: 1.5 %
HCT: 41.2 % (ref 35.0–45.0)
Hemoglobin: 13.4 g/dL (ref 11.7–15.5)
Lymphs Abs: 2146 cells/uL (ref 850–3900)
MCH: 31.5 pg (ref 27.0–33.0)
MCHC: 32.5 g/dL (ref 32.0–36.0)
MCV: 96.9 fL (ref 80.0–100.0)
MPV: 11.8 fL (ref 7.5–12.5)
Monocytes Relative: 7.2 %
Neutro Abs: 4378 cells/uL (ref 1500–7800)
Neutrophils Relative %: 60.8 %
Platelets: 272 10*3/uL (ref 140–400)
RBC: 4.25 10*6/uL (ref 3.80–5.10)
RDW: 12.5 % (ref 11.0–15.0)
Total Lymphocyte: 29.8 %
WBC: 7.2 10*3/uL (ref 3.8–10.8)

## 2022-05-29 LAB — HEMOGLOBIN A1C
Hgb A1c MFr Bld: 7.9 % of total Hgb — ABNORMAL HIGH (ref <5.7)
Mean Plasma Glucose: 180 mg/dL
eAG (mmol/L): 10 mmol/L

## 2022-05-29 LAB — VITAMIN D 25 HYDROXY (VIT D DEFICIENCY, FRACTURES): Vit D, 25-Hydroxy: 86 ng/mL (ref 30–100)

## 2022-06-01 ENCOUNTER — Other Ambulatory Visit: Payer: Self-pay

## 2022-06-06 NOTE — Patient Instructions (Signed)

## 2022-07-01 ENCOUNTER — Ambulatory Visit
Admission: RE | Admit: 2022-07-01 | Discharge: 2022-07-01 | Disposition: A | Discharge status: Home / Self Care | Payer: Medicare HMO | Source: Ambulatory Visit | Attending: Nurse Practitioner | Admitting: Nurse Practitioner

## 2022-07-01 DIAGNOSIS — Z1231 Encounter for screening mammogram for malignant neoplasm of breast: Secondary | ICD-10-CM | POA: Diagnosis not present

## 2022-07-02 ENCOUNTER — Other Ambulatory Visit: Payer: Self-pay | Admitting: Nurse Practitioner

## 2022-07-02 ENCOUNTER — Ambulatory Visit: Payer: Medicare HMO

## 2022-07-02 DIAGNOSIS — M545 Low back pain, unspecified: Secondary | ICD-10-CM

## 2022-08-02 DIAGNOSIS — H0279 Other degenerative disorders of eyelid and periocular area: Secondary | ICD-10-CM | POA: Diagnosis not present

## 2022-08-02 DIAGNOSIS — Z09 Encounter for follow-up examination after completed treatment for conditions other than malignant neoplasm: Secondary | ICD-10-CM | POA: Diagnosis not present

## 2022-08-06 ENCOUNTER — Other Ambulatory Visit: Payer: Self-pay | Admitting: Nurse Practitioner

## 2022-08-24 ENCOUNTER — Ambulatory Visit: Payer: Medicare HMO | Admitting: Nurse Practitioner

## 2022-08-26 ENCOUNTER — Other Ambulatory Visit: Payer: Self-pay | Admitting: Internal Medicine

## 2022-08-26 ENCOUNTER — Other Ambulatory Visit: Payer: Self-pay | Admitting: Nurse Practitioner

## 2022-08-26 DIAGNOSIS — G8929 Other chronic pain: Secondary | ICD-10-CM

## 2022-08-26 DIAGNOSIS — M79605 Pain in left leg: Secondary | ICD-10-CM

## 2022-09-02 ENCOUNTER — Encounter: Payer: Self-pay | Admitting: Internal Medicine

## 2022-09-02 DIAGNOSIS — I10 Essential (primary) hypertension: Secondary | ICD-10-CM | POA: Diagnosis not present

## 2022-09-02 DIAGNOSIS — E1122 Type 2 diabetes mellitus with diabetic chronic kidney disease: Secondary | ICD-10-CM | POA: Diagnosis not present

## 2022-09-02 DIAGNOSIS — N181 Chronic kidney disease, stage 1: Secondary | ICD-10-CM | POA: Diagnosis not present

## 2022-09-02 DIAGNOSIS — E1169 Type 2 diabetes mellitus with other specified complication: Secondary | ICD-10-CM | POA: Diagnosis not present

## 2022-09-02 DIAGNOSIS — E559 Vitamin D deficiency, unspecified: Secondary | ICD-10-CM | POA: Diagnosis not present

## 2022-09-02 DIAGNOSIS — Z79899 Other long term (current) drug therapy: Secondary | ICD-10-CM | POA: Diagnosis not present

## 2022-09-02 DIAGNOSIS — E785 Hyperlipidemia, unspecified: Secondary | ICD-10-CM | POA: Diagnosis not present

## 2022-09-02 NOTE — Patient Instructions (Signed)

## 2022-09-02 NOTE — Progress Notes (Unsigned)
Future Appointments  Date Time Provider Department  09/03/2022                     6 mo ov 10:30 AM Lucky Cowboy, MD [facility]  02/22/2023                       cpe 11:00 AM Lucky Cowboy, MD [facility]  05/30/2023                     wellness 11:00 AM Adela Glimpse, NP [facility]    History of Present Illness:       This very nice 67 y.o. MBF  presents for 6  month follow up with HTN, HLD, T2_DM  and Vitamin D Deficiency.     Patient has hx/o GERD controlled with Diet & Famotidine.        Patient is treated for HTN  (1996)   & BP has been controlled at home. Today's BP is at goal - 110/62. Patient has had no complaints of any cardiac type chest pain, palpitations, dyspnea / orthopnea / PND, dizziness, claudication, or dependent edema.        Hyperlipidemia is controlled with diet & meds. Patient denies myalgias or other med SE's. Last Lipids were at goal :   Lab Results  Component Value Date   CHOL 102 09/02/2022   HDL 49 (L) 09/02/2022   LDLCALC 41 09/02/2022   TRIG 50 09/02/2022   CHOLHDL 2.1 09/02/2022     Also, the patient has history of T2_NIDDM (2003) w/ CKD1  and has had no symptoms of reactive hypoglycemia, diabetic polys, paresthesias or visual blurring.  Last A1c was not at goal :  Lab Results  Component Value Date   HGBA1C 7.8 (H) 09/02/2022                                                       Further, the patient also has history of Vitamin D Deficiency and supplements vitamin D . Last vitamin D was at goal :   Lab Results  Component Value Date   VD25OH 83 09/02/2022     Current Outpatient Medications on File Prior to Visit  Medication Sig   albuterol (VENTOLIN HFA) 108 (90 Base) MCG/ACT inhaler Use  1 to 2 inhalations  15 minutes  apart every 4 hours if needed to rescue asthma   aspirin EC 81 MG tablet Take 81 mg by mouth daily.   B Complex-C (SUPER B COMPLEX PO) Take 1 tablet by mouth daily. Takes every other day   Cholecalciferol  (VITAMIN D PO) Take 2 capsules by mouth daily. Take 5000 units by mouth twice a day   cyclobenzaprine (FLEXERIL) 10 MG tablet TAKE 1/2 TO 1 TABLET AT BEDTIME FOR MUSCLE SPASM   Diclofenac Sodium 1.5 % SOLN Place a small amount of gel on the bilateral hands for hand arthritis   empagliflozin (JARDIANCE) 25 MG TABS tablet Take 1 tab prior to breakfast daily for diabetes.   ferrous sulfate 325 (65 FE) MG tablet Take by mouth.   gabapentin (NEURONTIN) 600 MG tablet TAKE 1/2 TO 1 TABLET 2 TO 3 TIMES DAILY AS NEEDED FOR NEUROPATHY PAIN   lisinopril-hydrochlorothiazide (ZESTORETIC) 10-12.5 MG tablet TAKE 1 TABLET DAILY FOR BLOOD PRESSURE  AND DIABETIC KIDNEY PROTECTION   meloxicam (MOBIC) 15 MG tablet TAKE 1/2 TO 1 TABLET DAILY WITH FOOD FOR PAIN AND INFLAMMATION   metFORMIN (GLUCOPHAGE-XR) 500 MG 24 hr tablet TAKE 2 TABLETS TWICE DAILY WITH MEALS   metoprolol succinate (TOPROL-XL) 25 MG 24 hr tablet TAKE 1 TABLET EVERY DAY FOR BLOOD PRESSURE   mometasone (NASONEX) 50 MCG/ACT nasal spray Place 2 sprays into the nose daily. (Patient taking differently: Place 2 sprays into the nose as needed.)   Multiple Vitamin (MULITIVITAMIN WITH MINERALS) TABS Take 1 tablet by mouth daily.   pantoprazole (PROTONIX) 40 MG tablet TAKE 1 TABLET BY MOUTH ONCE DAILY TO PREVENT INDIGESTION AND HEARTBURN   phentermine (ADIPEX-P) 37.5 MG tablet Take  1/2 to 1 tablet  every Morning  for Dieting & Weight Loss   potassium chloride SA (KLOR-CON M) 20 MEQ tablet Take 1 tablet 3 x /day with Meals for Potassium                                                                /                                           TAKE                                      BY                                       MOUTH   rosuvastatin (CRESTOR) 20 MG tablet TAKE 1 TABLET EVERY DAY FOR CHOLESTEROL   No current facility-administered medications on file prior to visit.     Allergies  Allergen Reactions   Naprosyn [Naproxen] Other (See Comments)    GI  upset     PMHx:   Past Medical History:  Diagnosis Date   Allergic rhinitis    ASTHMA    BICUSPID AORTIC VALVE    CEREBROVASCULAR DISEASE    DEGENERATIVE JOINT DISEASE    Diabetes mellitus    DJD (degenerative joint disease)    GERD    HSV-1 (herpes simplex virus 1) infection    HYPERLIPIDEMIA    Hypertension    Palpitation    Vitamin D deficiency       Immunization History  Administered Date(s) Administered   Covid-19, Mrna,Vaccine(Spikevax)76yrs and older 12/02/2021   DT (Pediatric) 10/11/2014   DTaP 03/18/2004   Influenza Inj Mdck Quad With Preservative 11/17/2016   Influenza Split 11/15/2013, 11/30/2014   Influenza Whole 11/02/2012   Influenza, High Dose Seasonal PF 11/18/2021   Influenza,inj,Quad PF,6+ Mos 10/21/2017, 09/30/2018   Influenza-Unspecified 10/31/2015, 10/21/2017, 11/27/2019   Moderna SARS-COV2 Booster Vaccination 05/26/2020   Moderna Sars-Covid-2 Vaccination 04/15/2019, 05/19/2019   PFIZER(Purple Top)SARS-COV-2 Vaccination 01/01/2020   PNEUMOCOCCAL CONJUGATE-20 11/18/2021   PPD Test 07/30/2013, 10/11/2014, 11/06/2015, 11/17/2016, 12/12/2017, 12/27/2018, 01/08/2020   Pneumococcal Conjugate-13 11/15/2013, 11/17/2016   Pneumococcal Polysaccharide-23 05/16/1996   Respiratory Syncytial Virus Vaccine,Recomb Aduvanted(Arexvy) 12/02/2021   Zoster Recombinant(Shingrix) 01/02/2018, 04/05/2018, 04/19/2018  Past Surgical History:  Procedure Laterality Date   ABDOMINAL HYSTERECTOMY     repair of trigger finger      FHx:    Reviewed / unchanged   SHx:    Reviewed / unchanged    Systems Review:  Constitutional: Denies fever, chills, wt changes, headaches, insomnia, fatigue, night sweats, change in appetite. Eyes: Denies redness, blurred vision, diplopia, discharge, itchy, watery eyes.  ENT: Denies discharge, congestion, post nasal drip, epistaxis, sore throat, earache, hearing loss, dental pain, tinnitus, vertigo, sinus pain, snoring.  CV: Denies  chest pain, palpitations, irregular heartbeat, syncope, dyspnea, diaphoresis, orthopnea, PND, claudication or edema. Respiratory: denies cough, dyspnea, DOE, pleurisy, hoarseness, laryngitis, wheezing.  Gastrointestinal: Denies dysphagia, odynophagia, heartburn, reflux, water brash, abdominal pain or cramps, nausea, vomiting, bloating, diarrhea, constipation, hematemesis, melena, hematochezia  or hemorrhoids. Genitourinary: Denies dysuria, frequency, urgency, nocturia, hesitancy, discharge, hematuria or flank pain. Musculoskeletal: Denies arthralgias, myalgias, stiffness, jt. swelling, pain, limping or strain/sprain.  Skin: Denies pruritus, rash, hives, warts, acne, eczema or change in skin lesion(s). Neuro: No weakness, tremor, incoordination, spasms, paresthesia or pain. Psychiatric: Denies confusion, memory loss or sensory loss. Endo: Denies change in weight, skin or hair change.  Heme/Lymph: No excessive bleeding, bruising or enlarged lymph nodes.   Physical Exam  BP 110/62   Pulse 84   Temp 97.9 F (36.6 C)   Resp 14   Ht 4\' 10"  (1.473 m)   Wt 122 lb 12.8 oz (55.7 kg)   SpO2 98%   BMI 25.67 kg/m   Appears  well nourished, well groomed  and in no distress.  Eyes: PERRLA, EOMs, conjunctiva no swelling or erythema. Sinuses: No frontal/maxillary tenderness ENT/Mouth: EAC's clear, TM's nl w/o erythema, bulging. Nares clear w/o erythema, swelling, exudates. Oropharynx clear without erythema or exudates. Oral hygiene is good. Tongue normal, non obstructing. Hearing intact.  Neck: Supple. Thyroid not palpable. Car 2+/2+ without bruits, nodes or JVD. Chest: Respirations nl with BS clear & equal w/o rales, rhonchi, wheezing or stridor.  Cor: Heart sounds normal w/ regular rate and rhythm without sig. murmurs, gallops, clicks or rubs. Peripheral pulses normal and equal  without edema.  Abdomen: Soft & bowel sounds normal. Non-tender w/o guarding, rebound, hernias, masses or organomegaly.   Lymphatics: Unremarkable.  Musculoskeletal: Full ROM all peripheral extremities, joint stability, 5/5 strength and normal gait.  Skin: Warm, dry without exposed rashes, lesions or ecchymosis apparent.  Neuro: Cranial nerves intact, reflexes equal bilaterally. Sensory-motor testing grossly intact. Tendon reflexes grossly intact.  Pysch: Alert & oriented x 3.  Insight and judgement nl & appropriate. No ideations.   Assessment and Plan:   1. Essential hypertension  - Continue medication, monitor blood pressure at home.  - Continue DASH diet.  Reminder to go to the ER if any CP,  SOB, nausea, dizziness, severe HA, changes vision/speech.     - CBC with Differential/Platelet - COMPLETE METABOLIC PANEL WITH GFR - Magnesium - TSH      2. Hyperlipidemia associated with type 2 diabetes mellitus (HCC)  - Continue diet/meds, exercise,& lifestyle modifications.  - Continue monitor periodic cholesterol/liver & renal functions      - Lipid panel - TSH   3. Type 2 diabetes mellitus with stage 1 chronic kidney  disease, without long-term current use of insulin (HCC)  - Continue diet, exercise  - Lifestyle modifications.  - Monitor appropriate labs    - Hemoglobin A1C w/out eAG - Insulin, random   4. Vitamin D deficiency  - Continue supplementation    - VITAMIN D 25 Hydroxy    5. Medication management  - CBC with Differential/Platelet - COMPLETE METABOLIC PANEL WITH GFR - Magnesium - Lipid panel - TSH - Hemoglobin A1C w/out eAG - Insulin, random - VITAMIN D 25 Hydroxy          Discussed  regular exercise, BP monitoring, weight control to achieve/maintain BMI less than 25 and discussed med and SE's. Recommended labs to assess /monitor clinical status .  I discussed the assessment and treatment plan with the patient. The patient was provided an opportunity to ask questions and all were answered. The patient agreed with the plan  and demonstrated an understanding of the instructions.  I provided over 30 minutes of exam, counseling, chart review and  complex critical decision making.        The patient was advised to call back or seek an in-person evaluation if the symptoms worsen or if the condition fails to improve as anticipated.   Marinus Maw, MD

## 2022-09-03 ENCOUNTER — Ambulatory Visit (INDEPENDENT_AMBULATORY_CARE_PROVIDER_SITE_OTHER): Payer: Medicare HMO | Admitting: Internal Medicine

## 2022-09-03 VITALS — BP 110/62 | HR 84 | Temp 97.9°F | Resp 14 | Ht <= 58 in | Wt 122.8 lb

## 2022-09-03 DIAGNOSIS — E785 Hyperlipidemia, unspecified: Secondary | ICD-10-CM

## 2022-09-03 DIAGNOSIS — E1169 Type 2 diabetes mellitus with other specified complication: Secondary | ICD-10-CM

## 2022-09-03 DIAGNOSIS — M79604 Pain in right leg: Secondary | ICD-10-CM

## 2022-09-03 DIAGNOSIS — I10 Essential (primary) hypertension: Secondary | ICD-10-CM

## 2022-09-03 DIAGNOSIS — N181 Chronic kidney disease, stage 1: Secondary | ICD-10-CM | POA: Diagnosis not present

## 2022-09-03 DIAGNOSIS — E1122 Type 2 diabetes mellitus with diabetic chronic kidney disease: Secondary | ICD-10-CM | POA: Diagnosis not present

## 2022-09-03 DIAGNOSIS — E559 Vitamin D deficiency, unspecified: Secondary | ICD-10-CM | POA: Diagnosis not present

## 2022-09-03 DIAGNOSIS — M79605 Pain in left leg: Secondary | ICD-10-CM | POA: Diagnosis not present

## 2022-09-03 DIAGNOSIS — Z79899 Other long term (current) drug therapy: Secondary | ICD-10-CM

## 2022-09-03 MED ORDER — ROPINIROLE HCL 1 MG PO TABS: ORAL_TABLET | ORAL | 1 refills | Status: DC

## 2022-09-04 ENCOUNTER — Encounter: Payer: Self-pay | Admitting: Internal Medicine

## 2022-09-04 LAB — COMPLETE METABOLIC PANEL WITH GFR
AST: 15 U/L (ref 10–35)
Alkaline phosphatase (APISO): 62 U/L (ref 37–153)
BUN: 19 mg/dL (ref 7–25)
CO2: 27 mmol/L (ref 20–32)
Chloride: 100 mmol/L (ref 98–110)
Globulin: 2.7 g/dL (calc) (ref 1.9–3.7)
Glucose, Bld: 98 mg/dL (ref 65–99)
Sodium: 140 mmol/L (ref 135–146)
Total Bilirubin: 0.2 mg/dL (ref 0.2–1.2)
Total Protein: 7 g/dL (ref 6.1–8.1)

## 2022-09-04 LAB — LIPID PANEL
Cholesterol: 102 mg/dL (ref <200)
HDL: 49 mg/dL — ABNORMAL LOW (ref >=50)
LDL Cholesterol (Calc): 41 mg/dL (calc)
Triglycerides: 50 mg/dL (ref <150)

## 2022-09-04 LAB — CBC WITH DIFFERENTIAL/PLATELET
Absolute Monocytes: 621 cells/uL (ref 200–950)
Eosinophils Absolute: 99 cells/uL (ref 15–500)
Hemoglobin: 13.4 g/dL (ref 11.7–15.5)
Lymphs Abs: 2088 cells/uL (ref 850–3900)
MCHC: 31.9 g/dL — ABNORMAL LOW (ref 32.0–36.0)
MCV: 97.7 fL (ref 80.0–100.0)
Monocytes Relative: 6.9 %

## 2022-09-04 NOTE — Progress Notes (Signed)
^<^<^<^<^<^<^<^<^<^<^<^<^<^<^<^<^<^<^<^<^<^<^<^<^<^<^<^<^<^<^<^<^<^<^<^<^ ^>^>^>^>^>^>^>^>^>^>^>>^>^>^>^>^>^>^>^>^>^>^>^>^>^>^>^>^>^>^>^>^>^>^>^>^>  -Test results slightly outside the reference range are not unusual. If there is anything important, I will review this with you,  otherwise it is considered normal test values.  If you have further questions,  please do not hesitate to contact me at the office or via My Chart.   ^<^<^<^<^<^<^<^<^<^<^<^<^<^<^<^<^<^<^<^<^<^<^<^<^<^<^<^<^<^<^<^<^<^<^<^<^ ^>^>^>^>^>^>^>^>^>^>^>^>^>^>^>^>^>^>^>^>^>^>^>^>^>^>^>^>^>^>^>^>^>^>^>^>^  -  Chol = 102  -  Excellent   - Very low risk for Heart Attack  / Stroke ^>^>^>^>^>^>^>^>^>^>^>^>^>^>^>^>^>^>^>^>^>^>^>^>^>^>^>^>^>^>^>^>^>^>^>^>^ ^>^>^>^>^>^>^>^>^>^>^>^>^>^>^>^>^>^>^>^>^>^>^>^>^>^>^>^>^>^>^>^>^>^>^>^>^  -  A1c = 7.8%  - Still WAY TOO HIGH  !    Being diabetic has a  300% increased risk for heart attack,                                                    stroke, cancer, and alzheimer- type vascular dementia.   It is very important that you work harder with diet by                                     avoiding all foods that are white except chicken, fish & calliflower.  - Avoid white rice  (brown & wild rice is OK),   - Avoid white potatoes  (sweet potatoes in moderation is OK),   White bread or wheat bread or anything made out of   white flour like bagels, donuts, rolls, buns, biscuits, cakes,  - pastries, cookies, pizza crust, and pasta (made from white flour & egg whites)   - vegetarian pasta or spinach or wheat pasta is OK.  - Multigrain breads like Arnold's, Pepperidge Farm or                                                           multigrain sandwich thins or high fiber breads like   [place] bread or "Dave's Killer" breads that are 4 to 5 grams fiber per slice !  are best.    Diet, exercise and weight loss can reverse and cure diabetes in the early stages.    - Diet, exercise  and weight loss is very important in the                            control and prevention of complications of diabetes which                                                                          affects every system in your body, ie.   -Brain - dementia/stroke,  - eyes - glaucoma/blindness,  - heart - heart attack/heart failure,  - kidneys - dialysis,  - stomach - gastric paralysis,  - intestines - malabsorption,  - nerves - severe painful neuritis,  - circulation - gangrene & loss of a leg(s)  - and finally  . . . . . . . . . . . . . . . . . [name]    -  cancer and Alzheimers.  ^<^<^<^<^<^<^<^<^<^<^<^<^<^<^<^<^<^<^<^<^<^<^<^<^<^<^<^<^<^<^<^<^<^<^<^<^ ^>^>^>^>^>^>^>^>^>^>^>^>^>^>^>^>^>^>^>^>^>^>^>^>^>^>^>^>^>^>^>^>^>^>^>^>^  -   Magnesium  = 1.8  is  very  low           -               goal is betw 2.0 - 2.5,   - So..............[name]  Recommend that you take                                                            Magnesium 500 mg tablet  3 x / day with Meals !  - also important to eat lots of  leafy green vegetables   - spinach - Kale - collards - greens - okra - asparagus                                                                 - broccoli - quinoa - squash - almonds   - black, red, white beans  -  peas - green beans  ^<^<^<^<^<^<^<^<^<^<^<^<^<^<^<^<^<^<^<^<^<^<^<^<^<^<^<^<^<^<^<^<^<^<^<^<^ ^>^>^>^>^>^>^>^>^>^>^>^>^>^>^>^>^>^>^>^>^>^>^>^>^>^>^>^>^>^>^>^>^>^>^>^>^  -  Vitamin D = 83  - Great   - Please keep dosage same   ^<^<^<^<^<^<^<^<^<^<^<^<^<^<^<^<^<^<^<^<^<^<^<^<^<^<^<^<^<^<^<^<^<^<^<^<^ ^>^>^>^>^>^>^>^>^>^>^>^>^>^>^>^>^>^>^>^>^>^>^>^>^>^>^>^>^>^>^>^>^>^>^>^>^  -  All Else - CBC - Kidneys - Electrolytes - Liver  & Thyroid    - all  Normal / OK  ^<^<^<^<^<^<^<^<^<^<^<^<^<^<^<^<^<^<^<^<^<^<^<^<^<^<^<^<^<^<^<^<^<^<^<^<^ ^>^>^>^>^>^>^>^>^>^>^>^>^>^>^>^>^>^>^>^>^>^>^>^>^>^>^>^>^>^>^>^>^>^>^>^>^

## 2022-09-06 LAB — MAGNESIUM: Magnesium: 1.8 mg/dL (ref 1.5–2.5)

## 2022-09-06 LAB — CBC WITH DIFFERENTIAL/PLATELET
Basophils Absolute: 36 cells/uL (ref 0–200)
Basophils Relative: 0.4 %
Eosinophils Relative: 1.1 %
HCT: 42 % (ref 35.0–45.0)
MCH: 31.2 pg (ref 27.0–33.0)
MPV: 11.3 fL (ref 7.5–12.5)
Neutro Abs: 6156 cells/uL (ref 1500–7800)
Neutrophils Relative %: 68.4 %
Platelets: 256 10*3/uL (ref 140–400)
RBC: 4.3 10*6/uL (ref 3.80–5.10)
RDW: 12.7 % (ref 11.0–15.0)
Total Lymphocyte: 23.2 %
WBC: 9 10*3/uL (ref 3.8–10.8)

## 2022-09-06 LAB — COMPLETE METABOLIC PANEL WITH GFR
AG Ratio: 1.6 (calc) (ref 1.0–2.5)
ALT: 16 U/L (ref 6–29)
Albumin: 4.3 g/dL (ref 3.6–5.1)
Calcium: 9.4 mg/dL (ref 8.6–10.4)
Creat: 0.6 mg/dL (ref 0.50–1.05)
Potassium: 4.7 mmol/L (ref 3.5–5.3)
eGFR: 99 mL/min/{1.73_m2} (ref >=60)

## 2022-09-06 LAB — HEMOGLOBIN A1C W/OUT EAG: Hgb A1c MFr Bld: 7.8 % of total Hgb — ABNORMAL HIGH (ref <5.7)

## 2022-09-06 LAB — LIPID PANEL
Non-HDL Cholesterol (Calc): 53 mg/dL (calc) (ref <130)
Total CHOL/HDL Ratio: 2.1 (calc) (ref <5.0)

## 2022-09-06 LAB — INSULIN, RANDOM: Insulin: 7.5 u[IU]/mL

## 2022-09-06 LAB — VITAMIN D 25 HYDROXY (VIT D DEFICIENCY, FRACTURES): Vit D, 25-Hydroxy: 83 ng/mL (ref 30–100)

## 2022-09-06 LAB — TSH: TSH: 1.06 mIU/L (ref 0.40–4.50)

## 2022-09-16 ENCOUNTER — Other Ambulatory Visit: Payer: Self-pay | Admitting: Nurse Practitioner

## 2022-09-16 DIAGNOSIS — I10 Essential (primary) hypertension: Secondary | ICD-10-CM

## 2022-09-16 DIAGNOSIS — E1169 Type 2 diabetes mellitus with other specified complication: Secondary | ICD-10-CM

## 2022-09-16 DIAGNOSIS — E785 Hyperlipidemia, unspecified: Secondary | ICD-10-CM

## 2022-09-16 DIAGNOSIS — E119 Type 2 diabetes mellitus without complications: Secondary | ICD-10-CM

## 2022-09-27 NOTE — Progress Notes (Unsigned)
Future Appointments  Date Time Provider Department  09/28/2022 10:00 AM Lucky Cowboy, MD [facility]  12/08/2022                     9 mo ov  4:00 PM Adela Glimpse, NP [facility]  02/22/2023                         cpe 11:00 AM Lucky Cowboy, MD [facility]  05/30/2023                      wellness 11:00 AM Adela Glimpse, NP [facility]    History of Present Illness:       This very nice 66 y.o. MBF with HTN, HLD, T2_DM,  GERD and Vitamin D Deficiency presents with concerns          Current Outpatient Medications on File Prior to Visit  Medication Sig   albuterol  HFA  inhaler Use  1 to 2 inhalations   every 4 hours if needed   aspirin EC 81 MG tablet Take  daily.   SUPER B COMPLEX  Take 1 tablet  every other day   VITAMIN D  Take 2 capsules  daily. Take 5000 units twice a day   cyclobenzaprine 10 MG tablet TAKE 1/2 TO 1 TABLET AT BEDTIME    Diclofenac Sodium 1.5 % SOLN Place a small amount of gel on  bilateral hands for hand arthritis   empagliflozin (JARDIANCE) 25 MG TABS  Take 1 tab prior to breakfast   ferrous sulfate 325 (65 FE) MG tablet Take by mouth.   Gabapentin  600 MG tablet TAKE 1/2 TO 1 TABLET 2 TO 3 TIMES DAILY AS NEEDED    lisinopril-hct   10-12.5 MG tablet TAKE 1 TABLET DAILY    meloxicam  15 MG tablet TAKE 1/2 TO 1 TABLET DAILY    metFORMIN -XR) 500 MG  TAKE 2 TABLETS TWICE A DAY    metoprolol succinate XL 25 MG  TAKE 1 TABLET EVERY DAY    NASONEX nasal spray Place 2 sprays into the nose daily  as needed   Multiple Vitamin  Take 1 tablet daily.   pantoprazole 40 MG tablet TAKE 1 TABLET DAILY    phentermine 37.5 MG tablet Take  1/2 to 1 tablet  every Morning    potassium chloride  20 MEQ tablet Take 1 tablet 3 x /day   rOPINIRole  1 MG tablet Take 1/2-1 tab 3 x / day as needed for Restless Legs      rosuvastatin  20 MG tablet TAKE 1 TABLET EVERY DAY     Allergies  Allergen Reactions   Naprosyn [Naproxen] Other (See Comments)    GI upset      Problem list She has Cerebrovascular disease; Asthma; GERD; Osteoarthritis; BICUSPID AORTIC VALVE; Hyperlipidemia associated with type 2 diabetes mellitus (HCC); Vitamin D deficiency; Essential hypertension; Left carotid bruit; Medication management; Sleep disorder, shift-work; Palpitations; Abnormal electrocardiogram; Type 2 diabetes with stage 1 chronic kidney disease GFR>90 (HCC); Esotropia of right eye; Degeneration of lumbar or lumbosacral intervertebral disc; and BMI 23.0-23.9, adult on their problem list.   Observations/Objective:  There were no vitals taken for this visit.  HEENT - WNL. Neck - supple.  Chest - Clear equal BS. Cor - Nl HS. RRR w/o sig MGR. PP 1(+). No edema. MS- FROM w/o deformities.  Gait Nl. Neuro -  Nl w/o focal abnormalities.  Assessment and Plan:      Follow Up Instructions:        I discussed the assessment and treatment plan with the patient. The patient was provided an opportunity to ask questions and all were answered. The patient agreed with the plan and demonstrated an understanding of the instructions.       The patient was advised to call back or seek an in-person evaluation if the symptoms worsen or if the condition fails to improve as anticipated.    Marinus Maw, MD

## 2022-09-28 ENCOUNTER — Ambulatory Visit: Payer: Medicare HMO | Admitting: Internal Medicine

## 2022-09-28 ENCOUNTER — Encounter: Payer: Self-pay | Admitting: Internal Medicine

## 2022-09-28 VITALS — BP 120/76 | HR 89 | Temp 97.9°F | Resp 16 | Ht <= 58 in | Wt 126.0 lb

## 2022-09-28 DIAGNOSIS — E1122 Type 2 diabetes mellitus with diabetic chronic kidney disease: Secondary | ICD-10-CM

## 2022-09-28 DIAGNOSIS — N181 Chronic kidney disease, stage 1: Secondary | ICD-10-CM | POA: Diagnosis not present

## 2022-09-29 ENCOUNTER — Encounter: Payer: Self-pay | Admitting: Internal Medicine

## 2022-10-14 ENCOUNTER — Other Ambulatory Visit: Payer: Self-pay | Admitting: Internal Medicine

## 2022-10-14 DIAGNOSIS — E663 Overweight: Secondary | ICD-10-CM

## 2022-10-17 ENCOUNTER — Other Ambulatory Visit: Payer: Self-pay | Admitting: Nurse Practitioner

## 2022-10-17 DIAGNOSIS — G8929 Other chronic pain: Secondary | ICD-10-CM

## 2022-11-03 ENCOUNTER — Other Ambulatory Visit: Payer: Self-pay | Admitting: Nurse Practitioner

## 2022-11-07 ENCOUNTER — Other Ambulatory Visit: Payer: Self-pay | Admitting: Nurse Practitioner

## 2022-11-07 DIAGNOSIS — M5432 Sciatica, left side: Secondary | ICD-10-CM

## 2022-11-25 ENCOUNTER — Ambulatory Visit (HOSPITAL_COMMUNITY)
Admission: EM | Admit: 2022-11-25 | Discharge: 2022-11-25 | Disposition: A | Discharge status: Home / Self Care | Payer: Medicare HMO | Attending: Family Medicine | Admitting: Family Medicine

## 2022-11-25 ENCOUNTER — Ambulatory Visit (HOSPITAL_COMMUNITY): Payer: Medicare HMO

## 2022-11-25 DIAGNOSIS — M79672 Pain in left foot: Secondary | ICD-10-CM | POA: Insufficient documentation

## 2022-11-25 DIAGNOSIS — R1012 Left upper quadrant pain: Secondary | ICD-10-CM | POA: Diagnosis not present

## 2022-11-25 DIAGNOSIS — R1010 Upper abdominal pain, unspecified: Secondary | ICD-10-CM | POA: Diagnosis not present

## 2022-11-25 LAB — POCT URINALYSIS DIP (MANUAL ENTRY)
Bilirubin, UA: NEGATIVE
Blood, UA: NEGATIVE
Glucose, UA: >=1000 mg/dL — AB
Leukocytes, UA: NEGATIVE
Nitrite, UA: NEGATIVE
Protein Ur, POC: NEGATIVE mg/dL
Spec Grav, UA: 1.02 (ref 1.010–1.025)
Urobilinogen, UA: 0.2 U/dL — AB
pH, UA: 6.5 (ref 5.0–8.0)

## 2022-11-25 LAB — CBC WITH DIFFERENTIAL/PLATELET
Abs Immature Granulocytes: 0.03 10*3/uL (ref 0.00–0.07)
Basophils Absolute: 0.1 10*3/uL (ref 0.0–0.1)
Basophils Relative: 1 %
Eosinophils Absolute: 0.1 10*3/uL (ref 0.0–0.5)
Eosinophils Relative: 1 %
HCT: 41.3 % (ref 36.0–46.0)
Hemoglobin: 13.1 g/dL (ref 12.0–15.0)
Immature Granulocytes: 0 %
Lymphocytes Relative: 24 %
Lymphs Abs: 2.1 10*3/uL (ref 0.7–4.0)
MCH: 30.8 pg (ref 26.0–34.0)
MCHC: 31.7 g/dL (ref 30.0–36.0)
MCV: 97.2 fL (ref 80.0–100.0)
Monocytes Absolute: 0.7 10*3/uL (ref 0.1–1.0)
Monocytes Relative: 7 %
Neutro Abs: 6 10*3/uL (ref 1.7–7.7)
Neutrophils Relative %: 67 %
Platelets: 289 10*3/uL (ref 150–400)
RBC: 4.25 MIL/uL (ref 3.87–5.11)
RDW: 13.5 % (ref 11.5–15.5)
WBC: 8.9 10*3/uL (ref 4.0–10.5)
nRBC: 0 % (ref 0.0–0.2)

## 2022-11-25 LAB — COMPREHENSIVE METABOLIC PANEL
ALT: 20 U/L (ref 0–44)
AST: 20 U/L (ref 15–41)
Albumin: 3.6 g/dL (ref 3.5–5.0)
Alkaline Phosphatase: 79 U/L (ref 38–126)
Anion gap: 11 (ref 5–15)
BUN: 18 mg/dL (ref 8–23)
CO2: 28 mmol/L (ref 22–32)
Calcium: 9 mg/dL (ref 8.9–10.3)
Chloride: 98 mmol/L (ref 98–111)
Creatinine, Ser: 0.67 mg/dL (ref 0.44–1.00)
GFR, Estimated: >60 mL/min (ref >60)
Glucose, Bld: 318 mg/dL — ABNORMAL HIGH (ref 70–99)
Potassium: 4.3 mmol/L (ref 3.5–5.1)
Sodium: 137 mmol/L (ref 135–145)
Total Bilirubin: 0.3 mg/dL (ref 0.3–1.2)
Total Protein: 7 g/dL (ref 6.5–8.1)

## 2022-11-25 LAB — LIPASE, BLOOD: Lipase: 27 U/L (ref 11–51)

## 2022-11-25 NOTE — Discharge Instructions (Signed)

## 2022-11-25 NOTE — ED Triage Notes (Signed)
Pt c/o upper abdominal pain that started last night. She describes pain as sharp.

## 2022-12-07 NOTE — Progress Notes (Unsigned)
Future Appointments  Date Time Provider Department  12/08/2022                    9 mo ov 10:30 AM Lucky Cowboy, MD [facility]  02/22/2023                   cpe 11:00 AM Lucky Cowboy, MD [facility]  05/30/2023                  3 mo OV / wellness 11:00 AM Adela Glimpse, NP [facility]    History of Present Illness:       This very nice 67 y.o. MBF  with  HTN, HLD, T2_DM  and Vitamin D Deficiency  presents for 9  month follow up.   Patient has hx/o GERD controlled with Diet & Famotidine.         Patient is treated for HTN  (1996)  & BP has been controlled at home. Today's BP is at goal -                 . Patient has had no complaints of any cardiac type chest pain, palpitations, dyspnea / orthopnea / PND, dizziness, claudication, or dependent edema.        Hyperlipidemia is controlled with diet & meds. Patient denies myalgias or other med SE's. Last Lipids were at goal :   Lab Results  Component Value Date   CHOL 102 09/02/2022   HDL 49 (L) 09/02/2022   LDLCALC 41 09/02/2022   TRIG 50 09/02/2022   CHOLHDL 2.1 09/02/2022     Also, the patient has history of T2_NIDDM (2003) w/ CKD1  and has had no symptoms of reactive hypoglycemia, diabetic polys, paresthesias or visual blurring.  Last A1c was not at goal :  Lab Results  Component Value Date   HGBA1C 7.8 (H) 09/02/2022                                                       Further, the patient also has history of Vitamin D Deficiency and supplements vitamin D . Last vitamin D was at goal :   Lab Results  Component Value Date   VD25OH 83 09/02/2022     Current Outpatient Medications on File Prior to Visit  Medication Sig   albuterol HFA  inhaler Use  1 to 2 inhalations  every 4 hours if needed    aspirin EC 81 MG tablet Take daily.   B Complex-C  Take 1 tablet every other day   VITAMIN D  5000 units Take twice a day   cyclobenzaprine  10 MG tablet TAKE 1/2 TO 1 TABLET AT BEDTIME    empagliflozin 25 MG  TABS tablet Take 1 tab prior to breakfast daily    ferrous sulfate 325 (65 FE) MG tablet Take by mouth.   gabapentin  600 MG tablet TAKE 1/2-1 Ttablet 2-3 x /day as needed   lisinopril-hctz 10-12.5 MG tablet TAKE 1 TABLET DAILY    meloxicam  15 MG tablet TAKE 1/2 TO 1 TABLET DAILY    metFORMIN -XR 500 MG TAKE 2 TABLETS TWICE A DAY - MEALS   metoprolol succinate -XL 25 MG  TAKE 1 TABLET EVERY DAY    NASONEX  nasal spray Place 2 sprays into the  nose daily as needed   Multiple Vitamin  Take 1 tablet by mouth daily.   pantoprazole  40 MG tablet TAKE 1 TABLET DAILY    potassium chloride 20 MEQ tablet Take 1 tablet 3 x /day with Meals   rOPINIRole  1 MG tablet Take 1/2 to 1 tablet 3 x / day as needed f   rosuvastatin  20 MG tablet TAKE 1 TABLET EVERY DAY     Allergies  Allergen Reactions   Naprosyn [Naproxen] Other (See Comments)    GI upset     PMHx:   Past Medical History:  Diagnosis Date   Allergic rhinitis    ASTHMA    BICUSPID AORTIC VALVE    CEREBROVASCULAR DISEASE    DEGENERATIVE JOINT DISEASE    Diabetes mellitus    DJD (degenerative joint disease)    GERD    HSV-1 (herpes simplex virus 1) infection    HYPERLIPIDEMIA    Hypertension    Palpitation    Vitamin D deficiency      Immunization History  Administered Date(s) Administered   DT  10/11/2014   DTaP 03/18/2004   Influenza Inj Mdck Quad 11/17/2016   Influenza Split 11/15/2013, 11/30/2014   Influenza Whole 11/02/2012   Influenza, High Dose  11/18/2021, 11/17/2022   Influenza,inj,Quad PF,6+ Mos 10/21/2017, 09/30/2018   Influenza 10/31/2015, 10/21/2017, 11/27/2019   Moderna Covid-19  12/02/2021, 11/17/2022   Moderna SARS-COV2 Booster Vacc 05/26/2020   Moderna Sars-Covid-2 Vacc 04/15/2019, 05/19/2019   PFIZER SARS-COV-2 Vacc 01/01/2020   PNEUMOCOCCAL -20 11/18/2021   PPD Test 12/12/2017, 12/27/2018, 01/08/2020   Pneumococcal -13 11/15/2013, 11/17/2016   Pneumococcal -23 05/16/1996   RSVVaccine,  (Arexvy)  12/02/2021   Zoster Recombinant(Shingrix) 01/02/2018, 04/05/2018, 04/19/2018     Past Surgical History:  Procedure Laterality Date   ABDOMINAL HYSTERECTOMY     repair of trigger finger      FHx:    Reviewed / unchanged   SHx:    Reviewed / unchanged    Systems Review:  Constitutional: Denies fever, chills, wt changes, headaches, insomnia, fatigue, night sweats, change in appetite. Eyes: Denies redness, blurred vision, diplopia, discharge, itchy, watery eyes.  ENT: Denies discharge, congestion, post nasal drip, epistaxis, sore throat, earache, hearing loss, dental pain, tinnitus, vertigo, sinus pain, snoring.  CV: Denies chest pain, palpitations, irregular heartbeat, syncope, dyspnea, diaphoresis, orthopnea, PND, claudication or edema. Respiratory: denies cough, dyspnea, DOE, pleurisy, hoarseness, laryngitis, wheezing.  Gastrointestinal: Denies dysphagia, odynophagia, heartburn, reflux, water brash, abdominal pain or cramps, nausea, vomiting, bloating, diarrhea, constipation, hematemesis, melena, hematochezia  or hemorrhoids. Genitourinary: Denies dysuria, frequency, urgency, nocturia, hesitancy, discharge, hematuria or flank pain. Musculoskeletal: Denies arthralgias, myalgias, stiffness, jt. swelling, pain, limping or strain/sprain.  Skin: Denies pruritus, rash, hives, warts, acne, eczema or change in skin lesion(s). Neuro: No weakness, tremor, incoordination, spasms, paresthesia or pain. Psychiatric: Denies confusion, memory loss or sensory loss. Endo: Denies change in weight, skin or hair change.  Heme/Lymph: No excessive bleeding, bruising or enlarged lymph nodes.   Physical Exam  There were no vitals taken for this visit.  Appears  well nourished, well groomed  and in no distress.  Eyes: PERRLA, EOMs, conjunctiva no swelling or erythema. Sinuses: No frontal/maxillary tenderness ENT/Mouth: EAC's clear, TM's nl w/o erythema, bulging. Nares clear w/o erythema, swelling,  exudates. Oropharynx clear without erythema or exudates. Oral hygiene is good. Tongue normal, non obstructing. Hearing intact.  Neck: Supple. Thyroid not palpable. Car 2+/2+ without bruits, nodes or JVD. Chest: Respirations nl with  BS clear & equal w/o rales, rhonchi, wheezing or stridor.  Cor: Heart sounds normal w/ regular rate and rhythm without sig. murmurs, gallops, clicks or rubs. Peripheral pulses normal and equal  without edema.  Abdomen: Soft & bowel sounds normal. Non-tender w/o guarding, rebound, hernias, masses or organomegaly.  Lymphatics: Unremarkable.  Musculoskeletal: Full ROM all peripheral extremities, joint stability, 5/5 strength and normal gait.  Skin: Warm, dry without exposed rashes, lesions or ecchymosis apparent.  Neuro: Cranial nerves intact, reflexes equal bilaterally. Sensory-motor testing grossly intact. Tendon reflexes grossly intact.  Pysch: Alert & oriented x 3.  Insight and judgement nl & appropriate. No ideations.   Assessment and Plan:   1. Essential hypertension  - Continue medication, monitor blood pressure at home.  - Continue DASH diet.  Reminder to go to the ER if any CP,  SOB, nausea, dizziness, severe HA, changes vision/speech.     - CBC with Differential/Platelet - COMPLETE METABOLIC PANEL WITH GFR - Magnesium - TSH      2. Hyperlipidemia associated with type 2 diabetes mellitus (HCC)  - Continue diet/meds, exercise,& lifestyle modifications.  - Continue monitor periodic cholesterol/liver & renal functions      - Lipid panel - TSH   3. Type 2 diabetes mellitus with stage 1 chronic kidney                                              disease, without long-term current use of insulin (HCC)  - Continue diet, exercise  - Lifestyle modifications.  - Monitor appropriate labs    - Hemoglobin A1C w/out eAG - Insulin, random   4. Vitamin D deficiency  - Continue supplementation    - VITAMIN D 25 Hydroxy    5. Medication  management  - CBC with Differential/Platelet - COMPLETE METABOLIC PANEL WITH GFR - Magnesium - Lipid panel - TSH - Hemoglobin A1C w/out eAG - Insulin, random - VITAMIN D 25 Hydroxy          Discussed  regular exercise, BP monitoring, weight control to achieve/maintain BMI less than 25 and discussed med and SE's. Recommended labs to assess /monitor clinical status .  I discussed the assessment and treatment plan with the patient. The patient was provided an opportunity to ask questions and all were answered. The patient agreed with the plan and demonstrated an understanding of the instructions.  I provided over 30 minutes of exam, counseling, chart review and  complex critical decision making.        The patient was advised to call back or seek an in-person evaluation if the symptoms worsen or if the condition fails to improve as anticipated.   Marinus Maw, MD

## 2022-12-08 ENCOUNTER — Encounter: Payer: Self-pay | Admitting: Internal Medicine

## 2022-12-08 ENCOUNTER — Ambulatory Visit (INDEPENDENT_AMBULATORY_CARE_PROVIDER_SITE_OTHER): Payer: Medicare HMO | Admitting: Internal Medicine

## 2022-12-08 ENCOUNTER — Ambulatory Visit: Payer: Medicare HMO | Admitting: Nurse Practitioner

## 2022-12-08 VITALS — BP 124/78 | HR 93 | Temp 98.0°F | Resp 16 | Ht <= 58 in | Wt 126.0 lb

## 2022-12-08 DIAGNOSIS — E785 Hyperlipidemia, unspecified: Secondary | ICD-10-CM

## 2022-12-08 DIAGNOSIS — E559 Vitamin D deficiency, unspecified: Secondary | ICD-10-CM | POA: Diagnosis not present

## 2022-12-08 DIAGNOSIS — G4733 Obstructive sleep apnea (adult) (pediatric): Secondary | ICD-10-CM

## 2022-12-08 DIAGNOSIS — E1122 Type 2 diabetes mellitus with diabetic chronic kidney disease: Secondary | ICD-10-CM

## 2022-12-08 DIAGNOSIS — E1169 Type 2 diabetes mellitus with other specified complication: Secondary | ICD-10-CM | POA: Diagnosis not present

## 2022-12-08 DIAGNOSIS — I10 Essential (primary) hypertension: Secondary | ICD-10-CM | POA: Diagnosis not present

## 2022-12-08 DIAGNOSIS — Z79899 Other long term (current) drug therapy: Secondary | ICD-10-CM

## 2022-12-08 DIAGNOSIS — N181 Chronic kidney disease, stage 1: Secondary | ICD-10-CM

## 2022-12-08 NOTE — Patient Instructions (Signed)

## 2022-12-09 ENCOUNTER — Other Ambulatory Visit: Payer: Self-pay | Admitting: Internal Medicine

## 2022-12-09 DIAGNOSIS — E1165 Type 2 diabetes mellitus with hyperglycemia: Secondary | ICD-10-CM

## 2022-12-09 DIAGNOSIS — N181 Chronic kidney disease, stage 1: Secondary | ICD-10-CM

## 2022-12-09 LAB — MAGNESIUM: Magnesium: 1.6 mg/dL (ref 1.5–2.5)

## 2022-12-09 LAB — COMPLETE METABOLIC PANEL WITH GFR
AG Ratio: 1.6 (calc) (ref 1.0–2.5)
ALT: 15 U/L (ref 6–29)
AST: 14 U/L (ref 10–35)
Albumin: 4.3 g/dL (ref 3.6–5.1)
Alkaline phosphatase (APISO): 71 U/L (ref 37–153)
BUN: 17 mg/dL (ref 7–25)
CO2: 32 mmol/L (ref 20–32)
Calcium: 9.5 mg/dL (ref 8.6–10.4)
Chloride: 101 mmol/L (ref 98–110)
Creat: 0.54 mg/dL (ref 0.50–1.05)
Globulin: 2.7 g/dL (ref 1.9–3.7)
Glucose, Bld: 192 mg/dL — ABNORMAL HIGH (ref 65–99)
Potassium: 4.6 mmol/L (ref 3.5–5.3)
Sodium: 140 mmol/L (ref 135–146)
Total Bilirubin: 0.2 mg/dL (ref 0.2–1.2)
Total Protein: 7 g/dL (ref 6.1–8.1)
eGFR: 101 mL/min/{1.73_m2} (ref >=60)

## 2022-12-09 LAB — CBC WITH DIFFERENTIAL/PLATELET
Absolute Lymphocytes: 1701 {cells}/uL (ref 850–3900)
Absolute Monocytes: 546 {cells}/uL (ref 200–950)
Basophils Absolute: 42 {cells}/uL (ref 0–200)
Basophils Relative: 0.6 %
Eosinophils Absolute: 133 {cells}/uL (ref 15–500)
Eosinophils Relative: 1.9 %
HCT: 40.5 % (ref 35.0–45.0)
Hemoglobin: 12.6 g/dL (ref 11.7–15.5)
MCH: 30.5 pg (ref 27.0–33.0)
MCHC: 31.1 g/dL — ABNORMAL LOW (ref 32.0–36.0)
MCV: 98.1 fL (ref 80.0–100.0)
MPV: 11.7 fL (ref 7.5–12.5)
Monocytes Relative: 7.8 %
Neutro Abs: 4578 {cells}/uL (ref 1500–7800)
Neutrophils Relative %: 65.4 %
Platelets: 294 10*3/uL (ref 140–400)
RBC: 4.13 10*6/uL (ref 3.80–5.10)
RDW: 12.4 % (ref 11.0–15.0)
Total Lymphocyte: 24.3 %
WBC: 7 10*3/uL (ref 3.8–10.8)

## 2022-12-09 LAB — TEST AUTHORIZATION: TEST CODE:: 17306

## 2022-12-09 LAB — HEMOGLOBIN A1C
Hgb A1c MFr Bld: 8.9 %{Hb} — ABNORMAL HIGH (ref <5.7)
Mean Plasma Glucose: 209 mg/dL
eAG (mmol/L): 11.6 mmol/L

## 2022-12-09 LAB — TSH: TSH: 1.01 m[IU]/L (ref 0.40–4.50)

## 2022-12-09 LAB — LIPID PANEL
Cholesterol: 110 mg/dL (ref <200)
HDL: 48 mg/dL — ABNORMAL LOW (ref >=50)
LDL Cholesterol (Calc): 49 mg/dL
Non-HDL Cholesterol (Calc): 62 mg/dL (ref <130)
Total CHOL/HDL Ratio: 2.3 (calc) (ref <5.0)
Triglycerides: 46 mg/dL (ref <150)

## 2022-12-09 LAB — INSULIN, RANDOM: Insulin: 13.8 u[IU]/mL

## 2022-12-09 MED ORDER — TIRZEPATIDE 2.5 MG/0.5ML ~~LOC~~ SOAJ: SUBCUTANEOUS | 0 refills | Status: DC

## 2022-12-09 NOTE — Progress Notes (Signed)
<>*<>*<>*<>*<>*<>*<>*<>*<>*<>*<>*<>*<>*<>*<>*<>*<>*<>*<>*<>*<>*<>*<>*<>*<> <>*<>*<>*<>*<>*<>*<>*<>*<>*<>*<>*<>*<>*<>*<>*<>*<>*<>*<>*<>*<>*<>*<>*<>*<>  -Test results slightly outside the reference range are not unusual. If there is anything important, I will review this with you,  otherwise it is considered normal test values.  If you have further questions,  please do not hesitate to contact me at the office or via My Chart.   <>*<>*<>*<>*<>*<>*<>*<>*<>*<>*<>*<>*<>*<>*<>*<>*<>*<>*<>*<>*<>*<>*<>*<>*<> <>*<>*<>*<>*<>*<>*<>*<>*<>*<>*<>*<>*<>*<>*<>*<>*<>*<>*<>*<>*<>*<>*<>*<>*<>  -  Blood sugar = 192 mg% is too high                                            (Goal is less than 100 mg% )  &  - A1c ( 12 week average Blood sugar )       =      8.9% is way too high  !                                                                                                ( Goal is below 5.7% ! )   - So STOP Jardiance ( & continue Metformin )  &  Sending in a new Rx for a shot once a week either Mounjaro, Trulicity  or Ozempic   Whichever we can get [employer] to approve and                                        the  shot will help lose weight and help lower blood sugars.   Diet is still very Important.  Being diabetic has a  300% increased risk for heart attack,                                                 stroke, cancer, and alzheimer- type vascular dementia.   It is very important that you work harder with diet by                                 avoiding all foods that are white except chicken, fish & calliflower.  - Avoid white rice  (brown & wild rice is OK),   - Avoid white potatoes  (sweet potatoes in moderation is OK),   White bread or wheat bread or anything made out of                                   white flour like bagels, donuts, rolls, buns, biscuits, cakes,  - pastries, cookies, pizza crust, and pasta (made from white flour & egg whites)   - vegetarian pasta or spinach or  wheat pasta is OK.  - Multigrain breads like Arnold's, Pepperidge Farm or  multigrain sandwich thins or high fiber breads like   Eureka bread or "Dave's Killer" breads that are                                                                4 to 5 grams fiber per slice !  are best.    Diet, exercise and weight loss can reverse and cure diabetes in the early stages.    - Diet, exercise and weight loss is very important in the                                       control and prevention of complications of diabetes which   affects every system in your body, ie.   - Brain - dementia/stroke,  - eyes - glaucoma/blindness,  - heart - heart attack/heart failure,  - kidneys - dialysis,  - stomach - gastric paralysis,  - intestines - malabsorption,  - nerves - severe painful neuritis,  - circulation - gangrene & loss of a leg(s)  - and finally  . . . . . . . . . . . . . . . . . .    - cancer and Alzheimers.  <>*<>*<>*<>*<>*<>*<>*<>*<>*<>*<>*<>*<>*<>*<>*<>*<>*<>*<>*<>*<>*<>*<>*<>*<> <>*<>*<>*<>*<>*<>*<>*<>*<>*<>*<>*<>*<>*<>*<>*<>*<>*<>*<>*<>*<>*<>*<>*<>*<>

## 2022-12-15 NOTE — ED Provider Notes (Signed)
[facility]   [id number] 11/25/22 Arrival Time: 1238  ASSESSMENT & PLAN:  1. Left upper quadrant abdominal pain   2. Left foot pain    Benign abdominal exam. No indications for urgent abdominal/pelvic imaging at this time. Discussed.  I have personally viewed and independently interpreted the imaging studies ordered this visit. AAS: No acute changes. No free air.  Foot pain could be plantar fasciitis. Discussed. Prefers OTC ibuprofen.    Discharge Instructions      You have been seen today for abdominal pain. Your evaluation was not suggestive of any emergent condition requiring medical intervention at this time. However, some abdominal problems make take more time to appear. Therefore, it is very important for you to pay attention to any new symptoms or worsening of your current condition.  Please return here or to the Emergency Department immediately should you begin to feel worse in any way or have any of the following symptoms: increasing or different abdominal pain, persistent vomiting, inability to drink fluids, fevers, or shaking chills.   You have had labs (blood tests) sent today. We will call you with any significant abnormalities or if there is need to begin or change treatment or pursue further follow up.  You may also review your test results online through MyChart. If you do not have a MyChart account, instructions to sign up should be on your discharge paperwork.     Follow-up Information     Schedule an appointment as soon as possible for a visit  with Lucky Cowboy, MD.   Specialty: Internal Medicine Why: For follow up. Contact information: [address] [place] [address] [phone]         [facility] Emergency Department at [facility].   Specialty: Emergency Medicine Why: If your abdominal pain worsens in any way. Contact information: [address] [place]  [address] [phone]        Schedule an appointment as soon as possible for a visit  with [facility]).   Why: To evaluate your foot pain. Contact information: [address] [place],  [place]  [address]  [phone]                Reviewed expectations re: course of current medical issues. Questions answered. Outlined signs and symptoms indicating need for more acute intervention. Patient verbalized understanding. After Visit Summary given.   SUBJECTIVE: History from: patient. [name] is a 67 y.o. female who presents with complaint of upper abdominal pain that started last night. She describes pain as sharp. Denies n/v/d. Denies fever. Pain less at this time. Ambulatory. No urinary symptoms.  No LMP recorded. Patient has had a hysterectomy.  Past Surgical History:  Procedure Laterality Date   ABDOMINAL HYSTERECTOMY     repair of trigger finger       OBJECTIVE:  Vitals:   11/25/22 1352  BP: (!) 151/77  Pulse: 87  Resp: 16  Temp: 97.6 F (36.4 C)  TempSrc: Oral  SpO2: 96%    General appearance: alert, oriented, no acute distress HEENT: [place]; AT; oropharynx moist Lungs: unlabored respirations Abdomen: soft; without distention; mild  and poorly localized tenderness to palpation over LUQ ; normal bowel sounds; without masses or organomegaly; without guarding or rebound tenderness Back: without reported CVA tenderness; FROM at waist Extremities: without LE edema; symmetrical; without gross deformities Skin: warm and dry Neurologic: normal gait Psychological: alert and cooperative; normal mood and affect  Labs: Results for orders placed or performed during the hospital encounter of 11/25/22  CBC with Differential/Platelet  Result Value Ref Range   WBC 8.9 4.0 - 10.5 K/uL   RBC 4.25 3.87 - 5.11 MIL/uL   Hemoglobin 13.1 12.0 - 15.0 g/dL   HCT 40.9 81.1 - 91.4 %   MCV 97.2 80.0 - 100.0 fL   MCH 30.8 26.0 - 34.0 pg    MCHC 31.7 30.0 - 36.0 g/dL   RDW 78.2 95.6 - 21.3 %   Platelets 289 150 - 400 K/uL   nRBC 0.0 0.0 - 0.2 %   Neutrophils Relative % 67 %   Neutro Abs 6.0 1.7 - 7.7 K/uL   Lymphocytes Relative 24 %   Lymphs Abs 2.1 0.7 - 4.0 K/uL   Monocytes Relative 7 %   Monocytes Absolute 0.7 0.1 - 1.0 K/uL   Eosinophils Relative 1 %   Eosinophils Absolute 0.1 0.0 - 0.5 K/uL   Basophils Relative 1 %   Basophils Absolute 0.1 0.0 - 0.1 K/uL   Immature Granulocytes 0 %   Abs Immature Granulocytes 0.03 0.00 - 0.07 K/uL  Lipase, blood  Result Value Ref Range   Lipase 27 11 - 51 U/L  Comprehensive metabolic panel  Result Value Ref Range   Sodium 137 135 - 145 mmol/L   Potassium 4.3 3.5 - 5.1 mmol/L   Chloride 98 98 - 111 mmol/L   CO2 28 22 - 32 mmol/L   Glucose, Bld 318 (H) 70 - 99 mg/dL   BUN 18 8 - 23 mg/dL   Creatinine, Ser 0.86 0.44 - 1.00 mg/dL   Calcium 9.0 8.9 - 57.8 mg/dL   Total Protein 7.0 6.5 - 8.1 g/dL   Albumin 3.6 3.5 - 5.0 g/dL   AST 20 15 - 41 U/L   ALT 20 0 - 44 U/L   Alkaline Phosphatase 79 38 - 126 U/L   Total Bilirubin 0.3 0.3 - 1.2 mg/dL   GFR, Estimated >46 >96 mL/min   Anion gap 11 5 - 15  POC urinalysis dipstick  Result Value Ref Range   Color, UA yellow yellow   Clarity, UA clear clear   Glucose, UA >=1,000 (A) negative mg/dL   Bilirubin, UA negative negative   Ketones, POC UA small (15) (A) negative mg/dL   Spec Grav, UA 2.952 8.413 - 1.025   Blood, UA negative negative   pH, UA 6.5 5.0 - 8.0   Protein Ur, POC negative negative mg/dL   Urobilinogen, UA 0.2 (A) 0.2 or 1.0 E.U./dL   Nitrite, UA Negative Negative   Leukocytes, UA Negative Negative   Labs Reviewed  COMPREHENSIVE METABOLIC PANEL - Abnormal; Notable for the following components:      Result Value   Glucose, Bld 318 (*)    All other components within normal limits  POCT URINALYSIS DIP (MANUAL ENTRY) - Abnormal; Notable for the following components:   Glucose, UA >=1,000 (*)    Ketones, POC  UA small (15) (*)    Urobilinogen, UA 0.2 (*)    All other components within normal limits  CBC WITH DIFFERENTIAL/PLATELET  LIPASE, BLOOD    Imaging: DG Abd Acute W/Chest  Result Date: 11/25/2022 CLINICAL DATA:  Upper abdominal pain EXAM: DG ABDOMEN ACUTE WITH 1 VIEW CHEST COMPARISON:  04/07/2022, 05/14/2011 FINDINGS: There is no evidence of dilated bowel loops or free intraperitoneal air. Moderate-large volume stool throughout the colon. No radiopaque calculi or other significant radiographic abnormality is seen. Heart  size and mediastinal contours are within normal limits. Both lungs are clear. IMPRESSION: 1. Nonobstructive bowel gas pattern. 2. Moderate-large volume stool throughout the colon. 3. No acute cardiopulmonary disease. Electronically Signed   By: Duanne Guess D.O.   On: 11/25/2022 16:47     Allergies  Allergen Reactions   Naprosyn [Naproxen] Other (See Comments)    GI upset                                               Past Medical History:  Diagnosis Date   Allergic rhinitis    ASTHMA    BICUSPID AORTIC VALVE    CEREBROVASCULAR DISEASE    DEGENERATIVE JOINT DISEASE    Diabetes mellitus    DJD (degenerative joint disease)    GERD    HSV-1 (herpes simplex virus 1) infection    HYPERLIPIDEMIA    Hypertension    Palpitation    Vitamin D deficiency     Social History   Socioeconomic History   Marital status: Married    Spouse name: Not on file   Number of children: Not on file   Years of education: Not on file   Highest education level: Not on file  Occupational History   Not on file  Tobacco Use   Smoking status: Never   Smokeless tobacco: Never  Vaping Use   Vaping status: Never Used  Substance and Sexual Activity   Alcohol use: Not Currently   Drug use: No   Sexual activity: Not on file  Other Topics Concern   Not on file  Social History Narrative   Not on file   Social Determinants of Health   Financial Resource Strain: Not on file   Food Insecurity: Not on file  Transportation Needs: Not on file  Physical Activity: Not on file  Stress: Not on file  Social Connections: Not on file  Intimate Partner Violence: Not on file    Family History  Problem Relation Age of Onset   Coronary artery disease Mother    Heart disease Mother    Hypertension Mother    Diabetes Father    Hypertension Father    Breast cancer Maternal Renato Gails, MD 12/15/22 1347

## 2022-12-29 ENCOUNTER — Ambulatory Visit (INDEPENDENT_AMBULATORY_CARE_PROVIDER_SITE_OTHER): Payer: Medicare HMO

## 2022-12-29 ENCOUNTER — Encounter: Payer: Self-pay | Admitting: Podiatry

## 2022-12-29 ENCOUNTER — Ambulatory Visit: Payer: Medicare HMO | Admitting: Podiatry

## 2022-12-29 VITALS — Ht <= 58 in | Wt 126.0 lb

## 2022-12-29 DIAGNOSIS — M79671 Pain in right foot: Secondary | ICD-10-CM

## 2022-12-29 DIAGNOSIS — M79672 Pain in left foot: Secondary | ICD-10-CM

## 2022-12-29 DIAGNOSIS — M7752 Other enthesopathy of left foot: Secondary | ICD-10-CM

## 2022-12-29 DIAGNOSIS — M7751 Other enthesopathy of right foot: Secondary | ICD-10-CM | POA: Diagnosis not present

## 2022-12-29 NOTE — Progress Notes (Signed)
Subjective:  Patient ID: [name], female    DOB: 19-Sep-1955,  MRN: [id number]  Chief Complaint  Patient presents with   Foot Pain    Pt is here due to bilateral foot pain, pt states pain has been there for over a year, top of both feet hurt and under her toes feels numb.    67 y.o. female presents with the above complaint.  Patient presents with bilateral MTP capsulitis x 2.  Patient states painful to touch is progressive gotten worse worse with ambulation worse with pressure she would like to discuss steroid injection.  She has not seen MRIs but is seeing me.  Pain scale is 5 out of 10 dull aching nature hurts with ambulation.   Review of Systems: Negative except as noted in the HPI. Denies N/V/F/Ch.  Past Medical History:  Diagnosis Date   Allergic rhinitis    ASTHMA    BICUSPID AORTIC VALVE    CEREBROVASCULAR DISEASE    DEGENERATIVE JOINT DISEASE    Diabetes mellitus    DJD (degenerative joint disease)    GERD    HSV-1 (herpes simplex virus 1) infection    HYPERLIPIDEMIA    Hypertension    Palpitation    Vitamin D deficiency     Current Outpatient Medications:    aspirin EC 81 MG tablet, Take 81 mg by mouth daily., Disp: , Rfl:    B Complex-C (SUPER B COMPLEX PO), Take 1 tablet by mouth daily. Takes every other day, Disp: , Rfl:    Cholecalciferol (VITAMIN D PO), Take 2 capsules by mouth daily. Take 5000 units by mouth twice a day, Disp: , Rfl:    cyclobenzaprine (FLEXERIL) 10 MG tablet, TAKE 1/2 TO 1 TABLET AT BEDTIME FOR MUSCLE SPASM, Disp: 60 tablet, Rfl: 5   empagliflozin (JARDIANCE) 25 MG TABS tablet, Take 1 tab prior to breakfast daily for diabetes., Disp: 90 tablet, Rfl: 3   ferrous sulfate 325 (65 FE) MG tablet, Take by mouth., Disp: , Rfl:    gabapentin (NEURONTIN) 600 MG tablet, TAKE 1/2 TO 1 TABLET 2 TO 3 TIMES DAILY AS NEEDED FOR NEUROPATHY PAIN, Disp: 270 tablet, Rfl: 3   lisinopril-hydrochlorothiazide (ZESTORETIC) 10-12.5 MG tablet, TAKE 1 TABLET  DAILY FOR BLOOD PRESSURE AND DIABETIC KIDNEY PROTECTION, Disp: 90 tablet, Rfl: 3   meloxicam (MOBIC) 15 MG tablet, TAKE 1/2 TO 1 TABLET DAILY WITH FOOD FOR PAIN AND INFLAMMATION, Disp: 90 tablet, Rfl: 3   metFORMIN (GLUCOPHAGE-XR) 500 MG 24 hr tablet, TAKE 2 TABLETS TWICE A DAY WITH MEALS, Disp: 360 tablet, Rfl: 3   metoprolol succinate (TOPROL-XL) 25 MG 24 hr tablet, TAKE 1 TABLET EVERY DAY FOR BLOOD PRESSURE, Disp: 90 tablet, Rfl: 3   Multiple Vitamin (MULITIVITAMIN WITH MINERALS) TABS, Take 1 tablet by mouth daily., Disp: , Rfl:    pantoprazole (PROTONIX) 40 MG tablet, TAKE 1 TABLET BY MOUTH ONCE DAILY TO PREVENT INDIGESTION AND HEARTBURN (Patient taking differently: TAKE 1 TABLET BY MOUTH ONCE DAILY TO  PREVENT  INDIGESTION  AND  HEARTBURN), Disp: 90 tablet, Rfl: 0   potassium chloride SA (KLOR-CON M) 20 MEQ tablet, Take 1 tablet 3 x /day with Meals for Potassium                                                                /  TAKE                                      BY                                       MOUTH, Disp: 270 tablet, Rfl: 3   rOPINIRole (REQUIP) 1 MG tablet, Take 1/2 to 1 tablet 3 x / day as needed for Restless Legs                                                                      /                                                                   TAKE                                         BY                                                 MOUTH, Disp: 270 tablet, Rfl: 1   rosuvastatin (CRESTOR) 20 MG tablet, TAKE 1 TABLET EVERY DAY FOR CHOLESTEROL, Disp: 90 tablet, Rfl: 3   tirzepatide (MOUNJARO) 2.5 MG/0.5ML Pen, Inject 1 pen (2.5 mg)   into  Skin e  very 7 days   for Diabetes     ( e11.9 ), Disp: 2 mL, Rfl: 0   albuterol (VENTOLIN HFA) 108 (90 Base) MCG/ACT inhaler, Use  1 to 2 inhalations  15 minutes  apart every 4 hours if needed to rescue asthma, Disp: 48 g, Rfl: 3   mometasone (NASONEX) 50 MCG/ACT nasal spray, Place 2 sprays into  the nose daily. (Patient taking differently: Place 2 sprays into the nose as needed.), Disp: 51 g, Rfl: 3  Social History   Tobacco Use  Smoking Status Never  Smokeless Tobacco Never    Allergies  Allergen Reactions   Naprosyn [Naproxen] Other (See Comments)    GI upset   Objective:  There were no vitals filed for this visit. Body mass index is 26.33 kg/m. Constitutional Well developed. Well nourished.  Vascular Dorsalis pedis pulses palpable bilaterally. Posterior tibial pulses palpable bilaterally. Capillary refill normal to all digits.  No cyanosis or clubbing noted. Pedal hair growth normal.  Neurologic Normal speech. Oriented to person, place, and time. Epicritic sensation to light touch grossly present bilaterally.  Dermatologic Nails well groomed and normal in appearance. No open wounds. No skin lesions.  Orthopedic: Pain on palpation bilateral first metatarsophalangeal joint.  Hallux limitus noted decreased radicular pain noted.  No crepitus noted.  Pain  on palpation to the capsule of the first metatarsophalangeal joint mild bunion deformity noted   Radiographs: 3 views of skeletally mature the bilateral foot: Mild osteoarthritis of the first metatarsophalangeal joint notedUneven joint space narrowing noted midfoot arthritis noted posterior and plantar heel spurring noted Assessment:   1. Capsulitis of metatarsophalangeal (MTP) joint of right foot   2. Capsulitis of metatarsophalangeal (MTP) joint of left foot    Plan:  Patient was evaluated and treated and all questions answered.  Bilateral first metatarsophalangeal joint capsulitis -All questions and concerns were discussed with the patient extensive detail -Given the amount of pain that she is having she will benefit from steroid injection of decrease inflammatory component surgical pain.  Patient (steroid injection -A steroid injection was performed at bilateral first MTP using 1% plain Lidocaine and 10 mg of  Kenalog. This was well tolerated.   No follow-ups on file.

## 2023-01-05 ENCOUNTER — Ambulatory Visit (INDEPENDENT_AMBULATORY_CARE_PROVIDER_SITE_OTHER): Payer: Medicare HMO | Admitting: Neurology

## 2023-01-05 ENCOUNTER — Encounter: Payer: Self-pay | Admitting: Neurology

## 2023-01-05 VITALS — BP 143/66 | HR 93 | Ht <= 58 in | Wt 125.0 lb

## 2023-01-05 DIAGNOSIS — R0681 Apnea, not elsewhere classified: Secondary | ICD-10-CM | POA: Diagnosis not present

## 2023-01-05 DIAGNOSIS — E663 Overweight: Secondary | ICD-10-CM

## 2023-01-05 DIAGNOSIS — Z820 Family history of epilepsy and other diseases of the nervous system: Secondary | ICD-10-CM | POA: Diagnosis not present

## 2023-01-05 DIAGNOSIS — Z9189 Other specified personal risk factors, not elsewhere classified: Secondary | ICD-10-CM | POA: Diagnosis not present

## 2023-01-05 DIAGNOSIS — G4719 Other hypersomnia: Secondary | ICD-10-CM

## 2023-01-05 NOTE — Patient Instructions (Signed)

## 2023-01-05 NOTE — Progress Notes (Signed)
Subjective:    Patient ID: [name] is a 67 y.o. female.  HPI    Huston Foley, MD, PhD [facility] [address], [address] [address] [place], [place] [address]  Dear Dr. Oneta Rack,    I saw your patient, [name], upon your kind request in my sleep clinic today for initial consultation of her sleep disorder, in particular, concern for obstructive sleep apnea. The patient is unaccompanied today. As you know, the patient is a very pleasant 67 year-old female with an underlying medical history of allergies, asthma, bicuspid aortic valve, diabetes, degenerative joint disease, hypertension, hyperlipidemia, vitamin D deficiency, palpitations and overweight state, who reports witnessed apneas and daytime somnolence.  She is not sure if she snores.  Her husband does not complain.  Her mom has sleep apnea and she had a cousin with sleep apnea.  Epworth sleepiness score is 20 out of 24, fatigue severity score is 25 out of 63.  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  She generally snoring and excessive daytime somnolence as well as witnessed apneas per husband's report.  Her Epworth sleepiness score is.  I reviewed your office note from 12/08/2022.  She has a TV in her bedroom but does not have to have it on at night or during the day.  They do not have any pets in the house.  She works third shift, generally from 6 PM to 6 AM or 6 PM to 4 AM.  She works from Sunday night through Friday morning.  She may go to bed around 6 AM if she gets home early or a little later when she gets home after 6.  She may sleep till 12 or 1 PM.  She denies nocturia.  She does not have recurrent nocturnal or morning headaches.  She drinks caffeine in the form of diet soda, usually 1/day.  She does not drink any alcohol.  She is a non-smoker.  She lives with her husband and son.  Her husband typically drives her to work and from work because she is sleepy.  Take Flexeril before  falling asleep and half of the gabapentin.  Her Past Medical History Is Significant For: Past Medical History:  Diagnosis Date   Allergic rhinitis    ASTHMA    BICUSPID AORTIC VALVE    CEREBROVASCULAR DISEASE    DEGENERATIVE JOINT DISEASE    Diabetes mellitus    DJD (degenerative joint disease)    GERD    HSV-1 (herpes simplex virus 1) infection    HYPERLIPIDEMIA    Hypertension    Palpitation    Vitamin D deficiency     Her Past Surgical History Is Significant For: Past Surgical History:  Procedure Laterality Date   ABDOMINAL HYSTERECTOMY     repair of trigger finger      Her Family History Is Significant For: Family History  Problem Relation Age of Onset   Coronary artery disease Mother    Heart disease Mother    Hypertension Mother    Diabetes Father    Hypertension Father    Breast cancer Maternal Aunt    Sleep apnea Neg Hx     Her Social History Is Significant For: Social History   Socioeconomic History   Marital status: Married    Spouse name: Not on file   Number of children: Not on file   Years of education: Not on file   Highest education level: Not on file  Occupational History  Not on file  Tobacco Use   Smoking status: Never   Smokeless tobacco: Never  Vaping Use   Vaping status: Never Used  Substance and Sexual Activity   Alcohol use: Not Currently   Drug use: No   Sexual activity: Not on file  Other Topics Concern   Not on file  Social History Narrative   Not on file   Social Determinants of Health   Financial Resource Strain: Not on file  Food Insecurity: Not on file  Transportation Needs: Not on file  Physical Activity: Not on file  Stress: Not on file  Social Connections: Not on file    Her Allergies Are:  Allergies  Allergen Reactions   Naprosyn [Naproxen] Other (See Comments)    GI upset  :   Her Current Medications Are:  Outpatient Encounter Medications as of 01/05/2023  Medication Sig   aspirin EC 81 MG tablet  Take 81 mg by mouth daily.   B Complex-C (SUPER B COMPLEX PO) Take 1 tablet by mouth daily. Takes every other day   Cholecalciferol (VITAMIN D PO) Take 2 capsules by mouth daily. Take 5000 units by mouth twice a day   cyclobenzaprine (FLEXERIL) 10 MG tablet TAKE 1/2 TO 1 TABLET AT BEDTIME FOR MUSCLE SPASM   empagliflozin (JARDIANCE) 25 MG TABS tablet Take 1 tab prior to breakfast daily for diabetes.   ferrous sulfate 325 (65 FE) MG tablet Take by mouth.   gabapentin (NEURONTIN) 600 MG tablet TAKE 1/2 TO 1 TABLET 2 TO 3 TIMES DAILY AS NEEDED FOR NEUROPATHY PAIN   lisinopril-hydrochlorothiazide (ZESTORETIC) 10-12.5 MG tablet TAKE 1 TABLET DAILY FOR BLOOD PRESSURE AND DIABETIC KIDNEY PROTECTION   meloxicam (MOBIC) 15 MG tablet TAKE 1/2 TO 1 TABLET DAILY WITH FOOD FOR PAIN AND INFLAMMATION   metFORMIN (GLUCOPHAGE-XR) 500 MG 24 hr tablet TAKE 2 TABLETS TWICE A DAY WITH MEALS   metoprolol succinate (TOPROL-XL) 25 MG 24 hr tablet TAKE 1 TABLET EVERY DAY FOR BLOOD PRESSURE   Multiple Vitamin (MULITIVITAMIN WITH MINERALS) TABS Take 1 tablet by mouth daily.   pantoprazole (PROTONIX) 40 MG tablet TAKE 1 TABLET BY MOUTH ONCE DAILY TO PREVENT INDIGESTION AND HEARTBURN (Patient taking differently: TAKE 1 TABLET BY MOUTH ONCE DAILY TO  PREVENT  INDIGESTION  AND  HEARTBURN)   potassium chloride SA (KLOR-CON M) 20 MEQ tablet Take 1 tablet 3 x /day with Meals for Potassium                                                                /                                           TAKE                                      BY                                       MOUTH  rOPINIRole (REQUIP) 1 MG tablet Take 1/2 to 1 tablet 3 x / day as needed for Restless Legs                                                                      /                                                                   TAKE                                         BY                                                 MOUTH   rosuvastatin (CRESTOR) 20  MG tablet TAKE 1 TABLET EVERY DAY FOR CHOLESTEROL   tirzepatide (MOUNJARO) 2.5 MG/0.5ML Pen Inject 1 pen (2.5 mg)   into  Skin e  very 7 days   for Diabetes     ( e11.9 )   albuterol (VENTOLIN HFA) 108 (90 Base) MCG/ACT inhaler Use  1 to 2 inhalations  15 minutes  apart every 4 hours if needed to rescue asthma   mometasone (NASONEX) 50 MCG/ACT nasal spray Place 2 sprays into the nose daily. (Patient taking differently: Place 2 sprays into the nose as needed.)   No facility-administered encounter medications on file as of 01/05/2023.  :   Review of Systems:  Out of a complete 14 point review of systems, all are reviewed and negative with the exception of these symptoms as listed below:  Review of Systems  Neurological:        Pt here for sleep consult  Pt fatigue,few headaches  Pt denies sleep study,cpap machine,snoring,hypertension   ESS:20 FSS:25    Objective:  Neurological Exam  Physical Exam Physical Examination:   Vitals:   01/05/23 1512  BP: (!) 143/66  Pulse: 93    General Examination: The patient is a very pleasant 67 y.o. female in no acute distress. She appears well-developed and well-nourished and well groomed.   HEENT: Normocephalic, atraumatic, pupils are equal, round and reactive to light, eyes are disconjugate, bilateral "lazy" eyes.  Extraocular tracking is otherwise good without limitation to gaze excursion or nystagmus noted. Hearing is grossly intact. Face is symmetric with normal facial animation. Speech is clear with no dysarthria noted. There is no hypophonia. There is no lip, neck/head, jaw or voice tremor. Neck is supple with full range of passive and active motion. There are no carotid bruits on auscultation. Oropharynx exam reveals: mild mouth dryness, adequate dental hygiene and moderate airway crowding, due to small airway entry.  Mallampati class III.  Moderate overbite noted.  Tongue protrudes centrally and palate elevates symmetrically.  Small tonsils.   Neck circumference 13 1/4  inches.  Chest: Clear to auscultation without wheezing, rhonchi or crackles noted.  Heart: S1+S2+0, regular and normal without murmurs, rubs or gallops noted.   Abdomen: Soft, non-tender and non-distended.  Extremities: There is no pitting edema in the distal lower extremities bilaterally.   Skin: Warm and dry without trophic changes noted.   Musculoskeletal: exam reveals no obvious joint deformities.   Neurologically:  Mental status: The patient is awake, alert and oriented in all 4 spheres. Her immediate and remote memory, attention, language skills and fund of knowledge are appropriate. There is no evidence of aphasia, agnosia, apraxia or anomia. Speech is clear with normal prosody and enunciation. Thought process is linear. Mood is normal and affect is normal.  Cranial nerves II - XII are as described above under HEENT exam.  Motor exam: Normal bulk, strength and tone is noted. There is no obvious action or resting tremor.  Fine motor skills and coordination: grossly intact.  Cerebellar testing: No dysmetria or intention tremor. There is no truncal or gait ataxia.  Sensory exam: intact to light touch in the upper and lower extremities.  Gait, station and balance: She stands easily. No veering to one side is noted. No leaning to one side is noted. Posture is age-appropriate and stance is narrow based. Gait shows normal stride length and normal pace. No problems turning are noted.   Assessment and Plan:  In summary, [name] is a very pleasant 67 y.o.-year old female with an underlying medical history of allergies, asthma, bicuspid aortic valve, diabetes, degenerative joint disease, hypertension, hyperlipidemia, vitamin D deficiency, palpitations and overweight state, whose history and physical exam are concerning for sleep disordered breathing, particularly obstructive sleep apnea (OSA).  While a laboratory attended sleep study is typically considered  "gold standard" for evaluation of sleep disordered breathing, we mutually agreed to proceed with a home sleep test at this time, particularly because of her shiftwork.   I had a long chat with the patient about my findings and the diagnosis of sleep apnea, particularly OSA, its prognosis and treatment options. We talked about medical/conservative treatments, surgical interventions and non-pharmacological approaches for symptom control. I explained, in particular, the risks and ramifications of untreated moderate to severe OSA, especially with respect to developing cardiovascular disease down the road, including congestive heart failure (CHF), difficult to treat hypertension, cardiac arrhythmias (particularly A-fib), neurovascular complications including TIA, stroke and dementia. Even type 2 diabetes has, in part, been linked to untreated OSA. Symptoms of untreated OSA may include (but may not be limited to) daytime sleepiness, nocturia (i.e. frequent nighttime urination), memory problems, mood irritability and suboptimally controlled or worsening mood disorder such as depression and/or anxiety, lack of energy, lack of motivation, physical discomfort, as well as recurrent headaches, especially morning or nocturnal headaches. We talked about the importance of maintaining a healthy lifestyle and striving for healthy weight. In addition, we talked about the importance of striving for and maintaining good sleep hygiene. I recommended a sleep study at this time. I outlined the differences between a laboratory attended sleep study which is considered more comprehensive and accurate over the option of a home sleep test (HST); the latter may lead to underestimation of sleep disordered breathing in some instances and does not help with diagnosing upper airway resistance syndrome and is not accurate enough to diagnose primary central sleep apnea typically. I outlined possible surgical and non-surgical treatment options of  OSA, including the use of a positive airway pressure (PAP) device (i.e. CPAP, AutoPAP/APAP  or BiPAP in certain circumstances), a custom-made dental device (aka oral appliance, which would require a referral to a specialist dentist or orthodontist typically, and is generally speaking not considered for patients with full dentures or edentulous state), upper airway surgical options, such as traditional UPPP (which is not considered a first-line treatment) or the Inspire device (hypoglossal nerve stimulator, which would involve a referral for consultation with an ENT surgeon, after careful selection, following inclusion criteria - also not first-line treatment). I explained the PAP treatment option to the patient in detail, as this is generally considered first-line treatment.  The patient indicated that she would be willing to try PAP therapy, if the need arises. I explained the importance of being compliant with PAP treatment, not only for insurance purposes but primarily to improve patient's symptoms symptoms, and for the patient's long term health benefit, including to reduce Her cardiovascular risks longer-term.    We will pick up our discussion about the next steps and treatment options after testing.  We will keep her posted as to the test results by phone call and/or MyChart messaging where possible.  We will plan to follow-up in sleep clinic accordingly as well.  I answered all her questions today and the patient was in agreement.   I encouraged her to call with any interim questions, concerns, problems or updates or email [place] through MyChart.  Generally speaking, sleep test authorizations may take up to 2 weeks, sometimes less, sometimes longer, the patient is encouraged to get in touch with [place] if they do not hear back from the sleep lab staff directly within the next 2 weeks.  Thank you very much for allowing me to participate in the care of this nice patient. If I can be of any further assistance to  you please do not hesitate to call me at [phone].  Sincerely,   Huston Foley, MD, PhD

## 2023-01-31 IMAGING — DX DG CHEST 1V PORT
1 series · 1 of 1 positions shown · non-contrast
Comparison: November 17, 2011 chest radiograph; chest CT March 07, 2020

CLINICAL DATA: Cough and chest pain

EXAM:
PORTABLE CHEST 1 VIEW

[chest ap]
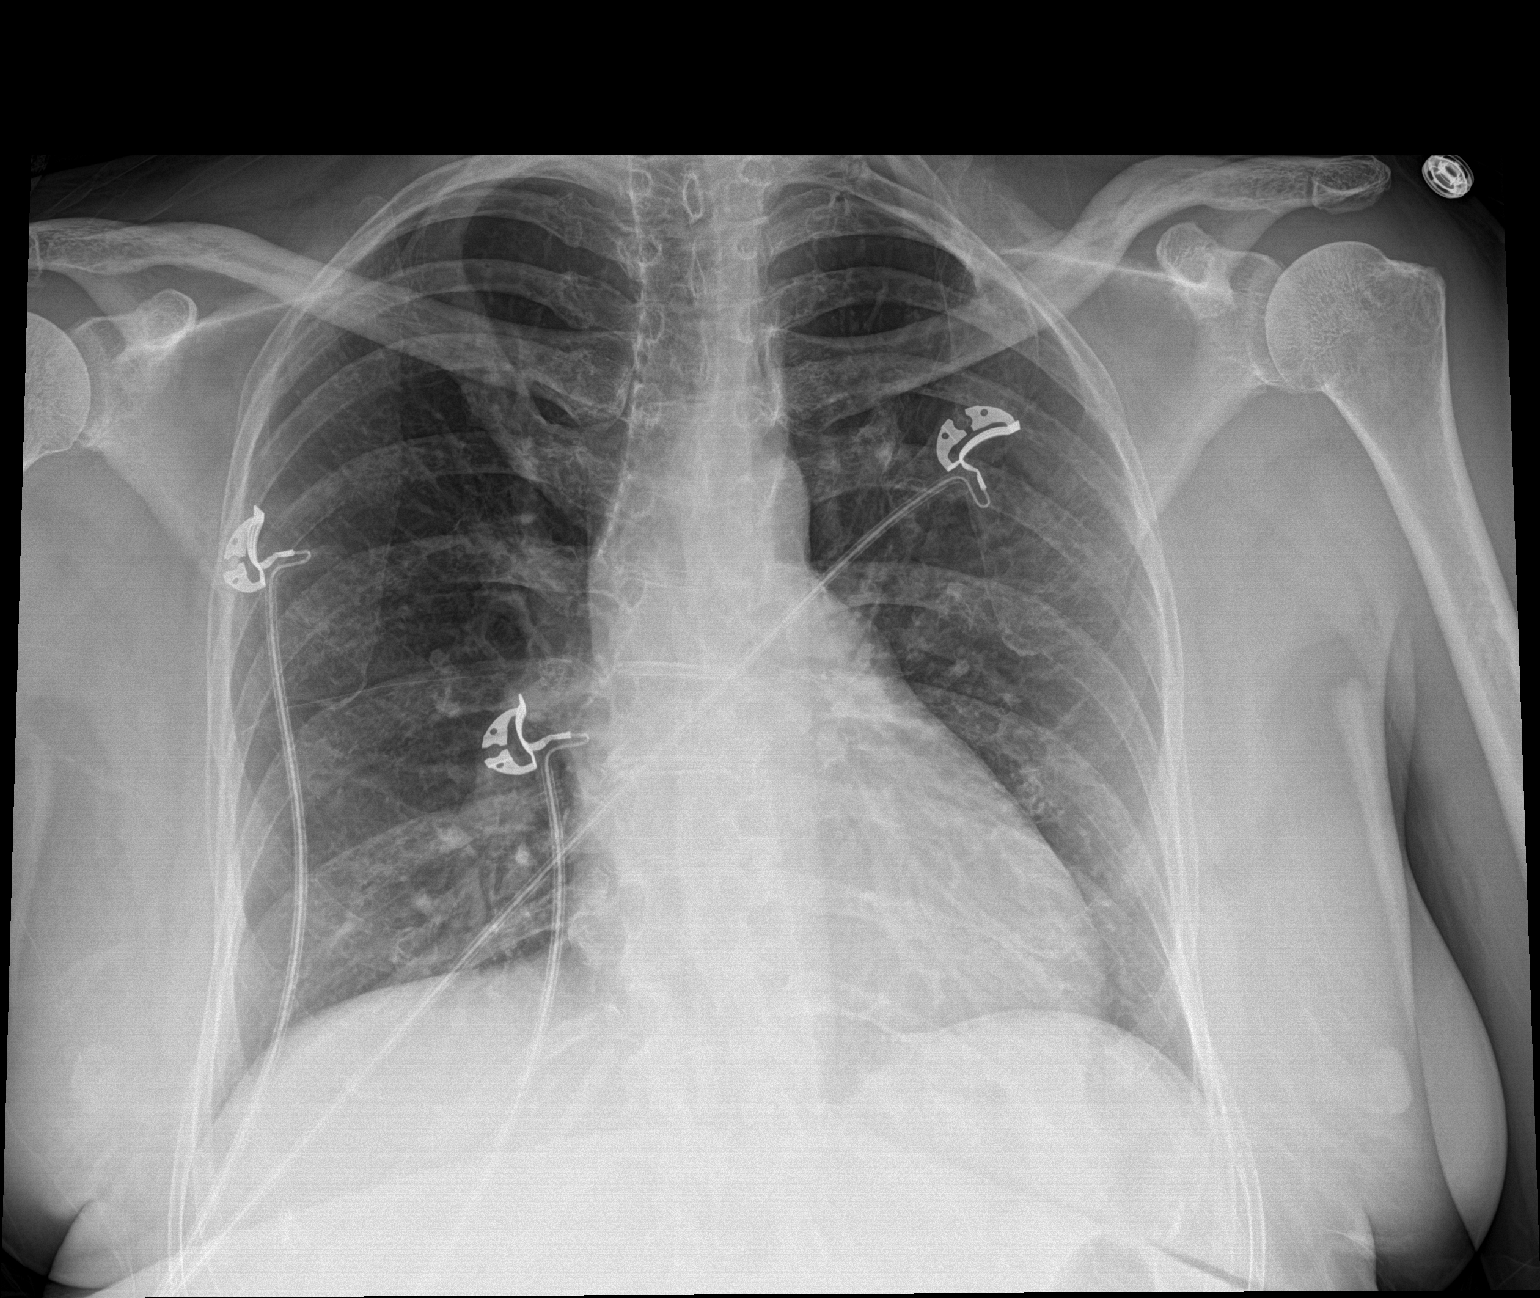

[1 of 1 positions shown; findings below may reference images not displayed]

FINDINGS: Lungs are clear. Heart size and pulmonary vascularity are normal. No
adenopathy. No pneumothorax. No bone lesions.
IMPRESSION: Lungs clear.  Cardiac silhouette within normal limits.

## 2023-02-18 ENCOUNTER — Telehealth: Payer: Self-pay | Admitting: Pharmacist

## 2023-02-18 NOTE — Progress Notes (Signed)
   02/18/2023  Patient ID: [name], female   DOB: 1955/07/19, 67 y.o.   MRN: [id number]  Attempted to contact patient for scheduled appointment for medication management. She is on the [employer] Diabetes report. Left HIPAA compliant message for patient to return my call at their convenience.    Cassius DOROTHA Brought, PharmD, BCACP [facility] Clinical Pharmacist [phone]

## 2023-02-22 ENCOUNTER — Other Ambulatory Visit: Payer: Self-pay | Admitting: Pharmacist

## 2023-02-22 ENCOUNTER — Encounter: Payer: Medicare Other | Admitting: Internal Medicine

## 2023-02-22 DIAGNOSIS — E1122 Type 2 diabetes mellitus with diabetic chronic kidney disease: Secondary | ICD-10-CM

## 2023-02-22 NOTE — Progress Notes (Signed)
 02/22/2023 Name: [name] MRN: [id number] DOB: 03-15-1955  Chief Complaint  Patient presents with   Medication Management    Diabetes    [name] is a 68 y.o. year old female who presented for a telephone visit.   They were referred to the pharmacist by a quality report for assistance in managing diabetes. ([employer] Metric Report)   Subjective:  Care Team: Primary Care Provider: Tonita Fallow, MD ; Next Scheduled Visit: 02/ Sleep Medicine-03/01/23  Medication Access/Adherence  Current Pharmacy:  [id number] [place], [place] - [address] AT [facility] OF [address] [place] [address] Phone: [phone] Fax: [phone]  [facility] - [place], [place] - [address] [place] [address] Phone: [phone] Fax: [phone]  [id number] - [place], [place] - [address] AT [facility] OF [name] [address] Phone: [phone] Fax: [phone]   Patient reports affordability concerns with their medications: Yes -Before she stopped Jardiance --she was taking her Mother's medication due to cost.  (Jardiance  was discontinued due to it causing dehydration) Patient reports access/transportation concerns to their pharmacy: No  Patient reports adherence concerns with their medications:  No      Diabetes:  Current medications:   Metformin  XR 500mg  2 tablets twice daily with meals Jardaince 25mg  1 tablet daily (patient reported she is not taking this anymore due to it causing dehydration) Mounjaro -Patient said she has never used this. [facility]--med is on hold/file  Current glucose readings: n/a- does not check blood sugars at home HgA1c 8.9%  Patient denies hypoglycemic s/sx including--dizziness, shakiness, sweating. Patient denies  hyperglycemic symptoms including-polyuria, polydipsia, polyphagia, nocturia, neuropathy, blurred vision.  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  Weight Management treatments previously prescribed:  Phentermine   Objective:  Lab Results  Component Value Date   HGBA1C 8.9 (H) 12/08/2022    Lab Results  Component Value Date   CREATININE 0.54 12/08/2022   BUN 17 12/08/2022   NA 140 12/08/2022   K 4.6 12/08/2022   CL 101 12/08/2022   CO2 32 12/08/2022    Lab Results  Component Value Date   CHOL 110 12/08/2022   HDL 48 (L) 12/08/2022   LDLCALC 49 12/08/2022   TRIG 46 12/08/2022   CHOLHDL 2.3 12/08/2022    Medications Reviewed Today     Reviewed by Jolee Cassius PARAS, RPH (Pharmacist) on 02/22/23 at 1330  Med List Status: <None>   Medication Order Taking? Sig Documenting Provider Last Dose Status Informant  albuterol  (VENTOLIN  HFA) 108 (90 Base) MCG/ACT inhaler [id number]  Use  1 to 2 inhalations  15 minutes  apart every 4 hours if needed to rescue asthma Tonita Fallow, MD  Expired 10/31/22 2359            Med Note (SOTO, CHRISTINA L   Tue Feb 16, 2022  1:23 PM) prn  aspirin  EC 81 MG tablet [id number] Yes Take 81 mg by mouth daily. [provider] Taking Active Multiple Informants  B Complex-C (SUPER B COMPLEX PO) [id number] Yes Take 1 tablet by mouth daily. Takes every other day [provider] Taking Active Multiple Informants  Cholecalciferol (VITAMIN D  PO) [id number] Yes Take 2 capsules by mouth daily. Take 5000 units by mouth twice a day [provider] Taking Active Self  cyclobenzaprine  (FLEXERIL ) 10 MG tablet [id number] Yes TAKE 1/2 TO 1 TABLET AT BEDTIME FOR MUSCLE SPASM Wilkinson, Dana  E, NP Taking Active   empagliflozin  (JARDIANCE ) 25 MG TABS tablet [id number] No Take 1 tab prior to breakfast daily for diabetes.  Patient not taking: Reported on 02/22/2023   Anna Knee, NP Not Taking Active   ferrous sulfate 325 (65 FE) MG tablet [id number] Yes Take by mouth. [provider] Taking Active   gabapentin  (NEURONTIN ) 600 MG tablet [id number] Yes TAKE 1/2 TO 1 TABLET 2 TO 3 TIMES DAILY AS NEEDED FOR NEUROPATHY PAIN Tonita Fallow, MD Taking Active   lisinopril -hydrochlorothiazide  (ZESTORETIC ) 10-12.5 MG tablet [id number] Yes TAKE 1 TABLET DAILY FOR BLOOD PRESSURE AND DIABETIC KIDNEY PROTECTION Wilkinson, Dana E, NP Taking Active   meloxicam  (MOBIC ) 15 MG tablet [id number] Yes TAKE 1/2 TO 1 TABLET DAILY WITH FOOD FOR PAIN AND INFLAMMATION Wilkinson, Dana E, NP Taking Active   metFORMIN  (GLUCOPHAGE -XR) 500 MG 24 hr tablet [id number] Yes TAKE 2 TABLETS TWICE A DAY WITH MEALS Wilkinson, Dana E, NP Taking Active   metoprolol  succinate (TOPROL -XL) 25 MG 24 hr tablet [id number] Yes TAKE 1 TABLET EVERY DAY FOR BLOOD PRESSURE Wilkinson, Dana E, NP Taking Active   mometasone  (NASONEX ) 50 MCG/ACT nasal spray [id number]  Place 2 sprays into the nose daily.  Patient taking differently: Place 2 sprays into the nose as needed.   Tonita Fallow, MD  Expired 05/28/22 2359   Multiple Vitamin (MULITIVITAMIN WITH MINERALS) TABS [id number] Yes Take 1 tablet by mouth daily. [provider] Taking Active Multiple Informants           Med Note CARLETTA, MELISSA   Fri Mar 24, 2018  8:50 AM)    pantoprazole  (PROTONIX ) 40 MG tablet [id number] Yes TAKE 1 TABLET BY MOUTH ONCE DAILY TO PREVENT INDIGESTION AND HEARTBURN  Patient taking differently: TAKE 1 TABLET BY MOUTH ONCE DAILY TO  PREVENT  INDIGESTION  AND  HEARTBURN   Wilkinson, Dana E, NP Taking Active   potassium chloride  SA (KLOR-CON  M) 20 MEQ tablet [id number] Yes Take 1 tablet 3 x /day with Meals for Potassium                                                                 /                                           TAKE                                      BY                                       MOUTH Tonita Fallow, MD Taking Active   rOPINIRole  (REQUIP ) 1 MG tablet [id number] Yes Take 1/2  to 1 tablet 3 x / day as needed for Restless Legs                                                                      /                                                                   TAKE                                         BY                                                 MOUTH Tonita Fallow, MD Taking Active   rosuvastatin  (CRESTOR ) 20 MG tablet [id number] Yes TAKE 1 TABLET EVERY DAY FOR CHOLESTEROL Wilkinson, Dana E, NP Taking Active   tirzepatide  (MOUNJARO ) 2.5 MG/0.5ML Pen [id number] No Inject 1 pen (2.5 mg)   into  Skin e  very 7 days   for Diabetes     ( e11.9 )  Patient not taking: Reported on 02/22/2023   Tonita Fallow, MD Not Taking Active               Diabetes: - Currently uncontrolled - Reviewed long term cardiovascular and renal outcomes of uncontrolled blood sugar - Reviewed current HgA1c - Reviewed dietary modifications including: decreasing the amount of potatoes, drinking only water, - Recommend to check glucose at least once a day.  Patient has an extreme aversion to sticking her finger and injections/pricks.  She may be a candidate for Continuous Glucose Monitoring.    Other:  Patient complained of right hand pain 7.5/10 and swelling.PCP office was called.  Patient will see Bascom Necessary, NP on Thursday 02/23/22 at 10 am.     Follow Up Plan:  Route note to PCP with recommendations to intensify the Patient's diabetes therapy.  Rybelsus  may be an option since Mounjaro  was on her med list but never taken.  Call patient back next week to see how she did with decreasing her potato consumption, drinking water as her primary beverage, and keeping a food journal.  Cassius DOROTHA Brought, PharmD, [facility] Clinical  Pharmacist [phone]

## 2023-02-23 NOTE — Progress Notes (Signed)
 Assessment and Plan:  [name] was seen today for an episodic visit.  Diagnoses and all order for this visit:  1. Left hand pain (Primary) Assess for gout flare - check uric acid level Continue Meloxicam  - will prescribe steroid taper given pain is 10/10 with edema. Possible right hand x-ray to assess for any underlying etiology if uric acid level normal.  - Uric acid - predniSONE  (DELTASONE ) 10 MG tablet; 1 tab 3 x day for 2 days, then 1 tab 2 x day for 2 days, then 1 tab 1 x day for 3 days  Dispense: 13 tablet; Refill: 0  2. Essential hypertension Controlled. Continue Lisinopril -hydrochlorothiazide . Discussed DASH (Dietary Approaches to Stop Hypertension) DASH diet is lower in sodium than a typical American diet. Cut back on foods that are high in saturated fat, cholesterol, and trans fats. Eat more whole-grain foods, fish, poultry, and nuts Remain active and exercise as tolerated daily.  Monitor BP at home-Call if greater than 130/80.  Check CMP/CBC  - CBC with Differential/Platelet - COMPLETE METABOLIC PANEL WITH GFR  3. Hyperlipidemia associated with type 2 diabetes mellitus (HCC) Stable - Continue Rosuvastatin  Discussed lifestyle modifications. Recommended diet heavy in fruits and veggies, omega 3's. Decrease consumption of animal meats, cheeses, and dairy products. Remain active and exercise as tolerated. Continue to monitor. Check lipids  - Lipid panel  4. Type 2 diabetes mellitus with stage 1 chronic kidney disease, without long-term current use of insulin  (HCC) Start Rybelsus  3 mg - Increase to 7 mg after 1 mo (03/27/2023). Continue Metformin  as directed. Discussed importance of monitoring BG while taking steroid - discussed risk for hyperglycemia.  Glucometer sent - patient does not check at home. Education: Reviewed 'ABCs' of diabetes management  Discussed goals to be met and/or maintained include A1C (<7) Blood pressure (<130/80) Cholesterol  (LDL <70) Continue Eye Exam yearly  Continue Dental Exam Q6 mo Discussed dietary recommendations Discussed Physical Activity recommendations Check A1C  - Hemoglobin A1c - Semaglutide  (RYBELSUS ) 3 MG TABS; Take 1 tablet (3 mg total) by mouth daily.  Dispense: 30 tablet; Refill: 0 - blood glucose meter kit and supplies KIT; Dispense based on patient and insurance preference. Use up to four times daily as directed.  Dispense: 1 each; Refill: 0  5. Poorly controlled diabetes mellitus (HCC) Discussed importance of medication compliance Continue 3 month follow ups for overall health evaluation and monitoring of A1c  6. Medication management All medications discussed and reviewed in full. All questions and concerns regarding medications addressed.    - CBC with Differential/Platelet - COMPLETE METABOLIC PANEL WITH GFR - Lipid panel - Hemoglobin A1c - Uric acid    Notify office for further evaluation and treatment, questions or concerns if s/s fail to improve. The risks and benefits of my recommendations, as well as other treatment options were discussed with the patient today. Questions were answered.  Further disposition pending results of labs. Discussed med's effects and SE's.    Over 30 minutes of exam, counseling, chart review, and critical decision making was performed.   Future Appointments  Date Time Provider Department Center  03/01/2023  3:00 PM [facility] None  03/24/2023 10:00 AM Tonita Fallow, MD [facility] None  06/23/2023 10:30 AM Laurice President, NP [facility] None    ------------------------------------------------------------------------------------------------------------------   HPI BP (!) 140/72   Pulse 87   Temp 97.8 F (36.6 C)   Ht 4' 10 (1.473 m)   Wt 128 lb (58.1 kg)   SpO2  98%   BMI 26.75 kg/m   68 y.o.female presents for evaluation of right hand pain.  Onset was 1 month ago without injury.  Relates to arthritis in thumb and  pointer finger.  No known injury.  Reports mother has hx of gout.  Localized edema.She has been taking Meloxicam  and Gabapentin  without effectiveness.   Her blood pressure has been controlled at home, today their BP is above goal BP: (!) 140/72 he takes Metoprolol  and Lisinopril -hydrochlorothiazide  and reports compliance. BP Readings from Last 3 Encounters:  02/24/23 (!) 140/72  01/05/23 (!) 143/66  12/08/22 124/78   She does not workout. She denies chest pain, shortness of breath, dizziness.   She is on cholesterol medication Rosuvastatin  and denies myalgias. Her cholesterol is at goal. The cholesterol last visit was:   Lab Results  Component Value Date   CHOL 111 02/24/2023   HDL 55 02/24/2023   LDLCALC 41 02/24/2023   TRIG 66 02/24/2023   CHOLHDL 2.0 02/24/2023    She has been working on diet and exercise for type 2 diabetes . Has started to get more parasthesias in hand and feet. She has not started GLP-1 injectable and reports Jardiance  causing her increased urination. Her A1c is not at goal.    Past Medical History:  Diagnosis Date   Allergic rhinitis    ASTHMA    BICUSPID AORTIC VALVE    CEREBROVASCULAR DISEASE    DEGENERATIVE JOINT DISEASE    Diabetes mellitus    DJD (degenerative joint disease)    GERD    HSV-1 (herpes simplex virus 1) infection    HYPERLIPIDEMIA    Hypertension    Palpitation    Vitamin D  deficiency      Allergies  Allergen Reactions   Naprosyn [Naproxen] Other (See Comments)    GI upset    Current Outpatient Medications on File Prior to Visit  Medication Sig   albuterol  (VENTOLIN  HFA) 108 (90 Base) MCG/ACT inhaler Use  1 to 2 inhalations  15 minutes  apart every 4 hours if needed to rescue asthma   aspirin  EC 81 MG tablet Take 81 mg by mouth daily.   B Complex-C (SUPER B COMPLEX PO) Take 1 tablet by mouth daily. Takes every other day   cetirizine (ZYRTEC) 10 MG chewable tablet Chew 10 mg by mouth every other day.   Cholecalciferol (VITAMIN  D PO) Take 2 capsules by mouth daily. Take 5000 units by mouth twice a day   cyclobenzaprine  (FLEXERIL ) 10 MG tablet TAKE 1/2 TO 1 TABLET AT BEDTIME FOR MUSCLE SPASM   docusate sodium (COLACE) 100 MG capsule Take 100 mg by mouth daily.   empagliflozin  (JARDIANCE ) 25 MG TABS tablet Take 1 tab prior to breakfast daily for diabetes.   ferrous sulfate 325 (65 FE) MG tablet Take by mouth.   gabapentin  (NEURONTIN ) 600 MG tablet TAKE 1/2 TO 1 TABLET 2 TO 3 TIMES DAILY AS NEEDED FOR NEUROPATHY PAIN   lisinopril -hydrochlorothiazide  (ZESTORETIC ) 10-12.5 MG tablet TAKE 1 TABLET DAILY FOR BLOOD PRESSURE AND DIABETIC KIDNEY PROTECTION   meloxicam  (MOBIC ) 15 MG tablet TAKE 1/2 TO 1 TABLET DAILY WITH FOOD FOR PAIN AND INFLAMMATION   METAMUCIL FIBER PO Take 1 tablet by mouth daily.   metFORMIN  (GLUCOPHAGE -XR) 500 MG 24 hr tablet TAKE 2 TABLETS TWICE A DAY WITH MEALS   metoprolol  succinate (TOPROL -XL) 25 MG 24 hr tablet TAKE 1 TABLET EVERY DAY FOR BLOOD PRESSURE   mometasone  (NASONEX ) 50 MCG/ACT nasal spray Place 2 sprays into  the nose daily. (Patient taking differently: Place 2 sprays into the nose as needed.)   Multiple Vitamin (MULITIVITAMIN WITH MINERALS) TABS Take 1 tablet by mouth daily.   pantoprazole  (PROTONIX ) 40 MG tablet TAKE 1 TABLET BY MOUTH ONCE DAILY TO PREVENT INDIGESTION AND HEARTBURN (Patient taking differently: TAKE 1 TABLET BY MOUTH ONCE DAILY TO  PREVENT  INDIGESTION  AND  HEARTBURN)   potassium chloride  SA (KLOR-CON  M) 20 MEQ tablet Take 1 tablet 3 x /day with Meals for Potassium                                                                /                                           TAKE                                      BY                                       MOUTH   rOPINIRole  (REQUIP ) 1 MG tablet Take 1/2 to 1 tablet 3 x / day as needed for Restless Legs                                                                      /                                                                    TAKE                                         BY                                                 MOUTH   rosuvastatin  (CRESTOR ) 20 MG tablet TAKE 1 TABLET EVERY DAY FOR CHOLESTEROL   Turmeric (QC TUMERIC COMPLEX PO) Take 1 capsule by mouth daily.   Zinc 30 MG CAPS Take 1 capsule by mouth daily.   No current facility-administered medications on file prior to visit.    ROS: all negative except what is noted in the HPI.   Physical Exam:  BP (!) 140/72   Pulse 87   Temp 97.8 F (36.6  C)   Ht 4' 10 (1.473 m)   Wt 128 lb (58.1 kg)   SpO2 98%   BMI 26.75 kg/m   General Appearance: NAD.  Awake, conversant and cooperative. Eyes: PERRLA, EOMs intact.  Sclera white.  Conjunctiva without erythema. Sinuses: No frontal/maxillary tenderness.  No nasal discharge. Nares patent.  ENT/Mouth: Ext aud canals clear.  Bilateral TMs w/DOL and without erythema or bulging. Hearing intact.  Posterior pharynx without swelling or exudate.  Tonsils without swelling or erythema.  Neck: Supple.  No masses, nodules or thyromegaly. Respiratory: Effort is regular with non-labored breathing. Breath sounds are equal bilaterally without rales, rhonchi, wheezing or stridor.  Cardio: RRR with no MRGs. Brisk peripheral pulses without edema.  Abdomen: Active BS in all four quadrants.  Soft and non-tender without guarding, rebound tenderness, hernias or masses. Lymphatics: Non tender without lymphadenopathy.  Musculoskeletal: Full ROM, 5/5 strength, normal ambulation.  No clubbing or cyanosis. Skin: Appropriate color for ethnicity. Warm without rashes, lesions, ecchymosis, ulcers.  Neuro: CN II-XII grossly normal. Normal muscle tone without cerebellar symptoms and intact sensation.   Psych: AO X 3,  appropriate mood and affect, insight and judgment.     BASCOM NECESSARY, NP 11:18 AM [facility]

## 2023-02-24 ENCOUNTER — Ambulatory Visit (INDEPENDENT_AMBULATORY_CARE_PROVIDER_SITE_OTHER): Payer: Medicare Other | Admitting: Nurse Practitioner

## 2023-02-24 VITALS — BP 140/72 | HR 87 | Temp 97.8°F | Ht <= 58 in | Wt 128.0 lb

## 2023-02-24 DIAGNOSIS — N181 Chronic kidney disease, stage 1: Secondary | ICD-10-CM

## 2023-02-24 DIAGNOSIS — E785 Hyperlipidemia, unspecified: Secondary | ICD-10-CM

## 2023-02-24 DIAGNOSIS — E1122 Type 2 diabetes mellitus with diabetic chronic kidney disease: Secondary | ICD-10-CM

## 2023-02-24 DIAGNOSIS — E1169 Type 2 diabetes mellitus with other specified complication: Secondary | ICD-10-CM

## 2023-02-24 DIAGNOSIS — Z79899 Other long term (current) drug therapy: Secondary | ICD-10-CM

## 2023-02-24 DIAGNOSIS — M79642 Pain in left hand: Secondary | ICD-10-CM

## 2023-02-24 DIAGNOSIS — I10 Essential (primary) hypertension: Secondary | ICD-10-CM | POA: Diagnosis not present

## 2023-02-24 DIAGNOSIS — E1165 Type 2 diabetes mellitus with hyperglycemia: Secondary | ICD-10-CM

## 2023-02-24 MED ORDER — RYBELSUS 3 MG PO TABS: 3.0000 mg | ORAL_TABLET | Freq: Every day | ORAL | 0 refills | Status: DC

## 2023-02-24 MED ORDER — BLOOD GLUCOSE MONITOR KIT
PACK | 0 refills | Status: AC
Start: 2023-02-24 — End: ?

## 2023-02-24 MED ORDER — PREDNISONE 10 MG PO TABS
ORAL_TABLET | ORAL | 0 refills | Status: DC
Start: 2023-02-24 — End: 2023-07-05

## 2023-02-25 LAB — CBC WITH DIFFERENTIAL/PLATELET
Absolute Lymphocytes: 1710 {cells}/uL (ref 850–3900)
Absolute Monocytes: 435 {cells}/uL (ref 200–950)
Basophils Absolute: 38 {cells}/uL (ref 0–200)
Basophils Relative: 0.5 %
Eosinophils Absolute: 68 {cells}/uL (ref 15–500)
Eosinophils Relative: 0.9 %
HCT: 39.3 % (ref 35.0–45.0)
Hemoglobin: 12.9 g/dL (ref 11.7–15.5)
MCH: 31.7 pg (ref 27.0–33.0)
MCHC: 32.8 g/dL (ref 32.0–36.0)
MCV: 96.6 fL (ref 80.0–100.0)
MPV: 11.7 fL (ref 7.5–12.5)
Monocytes Relative: 5.8 %
Neutro Abs: 5250 {cells}/uL (ref 1500–7800)
Neutrophils Relative %: 70 %
Platelets: 255 10*3/uL (ref 140–400)
RBC: 4.07 10*6/uL (ref 3.80–5.10)
RDW: 11.8 % (ref 11.0–15.0)
Total Lymphocyte: 22.8 %
WBC: 7.5 10*3/uL (ref 3.8–10.8)

## 2023-02-25 LAB — COMPLETE METABOLIC PANEL WITH GFR
AG Ratio: 1.5 (calc) (ref 1.0–2.5)
ALT: 20 U/L (ref 6–29)
AST: 17 U/L (ref 10–35)
Albumin: 4.1 g/dL (ref 3.6–5.1)
Alkaline phosphatase (APISO): 79 U/L (ref 37–153)
BUN: 20 mg/dL (ref 7–25)
CO2: 29 mmol/L (ref 20–32)
Calcium: 10 mg/dL (ref 8.6–10.4)
Chloride: 100 mmol/L (ref 98–110)
Creat: 0.55 mg/dL (ref 0.50–1.05)
Globulin: 2.7 g/dL (ref 1.9–3.7)
Glucose, Bld: 167 mg/dL — ABNORMAL HIGH (ref 65–99)
Potassium: 4.9 mmol/L (ref 3.5–5.3)
Sodium: 139 mmol/L (ref 135–146)
Total Bilirubin: 0.2 mg/dL (ref 0.2–1.2)
Total Protein: 6.8 g/dL (ref 6.1–8.1)
eGFR: 100 mL/min/{1.73_m2} (ref >=60)

## 2023-02-25 LAB — HEMOGLOBIN A1C
Hgb A1c MFr Bld: 10.4 %{Hb} — ABNORMAL HIGH (ref <5.7)
Mean Plasma Glucose: 252 mg/dL
eAG (mmol/L): 13.9 mmol/L

## 2023-02-25 LAB — LIPID PANEL
Cholesterol: 111 mg/dL (ref <200)
HDL: 55 mg/dL (ref >=50)
LDL Cholesterol (Calc): 41 mg/dL
Non-HDL Cholesterol (Calc): 56 mg/dL (ref <130)
Total CHOL/HDL Ratio: 2 (calc) (ref <5.0)
Triglycerides: 66 mg/dL (ref <150)

## 2023-02-25 LAB — URIC ACID: Uric Acid, Serum: 3.3 mg/dL (ref 2.5–7.0)

## 2023-02-27 ENCOUNTER — Other Ambulatory Visit: Payer: Self-pay | Admitting: Internal Medicine

## 2023-02-27 DIAGNOSIS — M79604 Pain in right leg: Secondary | ICD-10-CM

## 2023-02-27 NOTE — Patient Instructions (Signed)

## 2023-02-28 ENCOUNTER — Telehealth: Payer: Self-pay

## 2023-02-28 DIAGNOSIS — E1122 Type 2 diabetes mellitus with diabetic chronic kidney disease: Secondary | ICD-10-CM

## 2023-02-28 NOTE — Telephone Encounter (Signed)
 PA for Rybelsus completed over the phone. Faxed office notes and recent A1C results.

## 2023-03-01 ENCOUNTER — Ambulatory Visit: Payer: Medicare Other | Admitting: Neurology

## 2023-03-01 ENCOUNTER — Other Ambulatory Visit: Payer: Self-pay | Admitting: Pharmacist

## 2023-03-01 ENCOUNTER — Encounter: Payer: Self-pay | Admitting: Pharmacist

## 2023-03-01 DIAGNOSIS — G4733 Obstructive sleep apnea (adult) (pediatric): Secondary | ICD-10-CM | POA: Diagnosis not present

## 2023-03-01 DIAGNOSIS — E663 Overweight: Secondary | ICD-10-CM

## 2023-03-01 DIAGNOSIS — Z9189 Other specified personal risk factors, not elsewhere classified: Secondary | ICD-10-CM

## 2023-03-01 DIAGNOSIS — R0681 Apnea, not elsewhere classified: Secondary | ICD-10-CM

## 2023-03-01 DIAGNOSIS — Z820 Family history of epilepsy and other diseases of the nervous system: Secondary | ICD-10-CM

## 2023-03-01 DIAGNOSIS — E1122 Type 2 diabetes mellitus with diabetic chronic kidney disease: Secondary | ICD-10-CM

## 2023-03-01 DIAGNOSIS — G4719 Other hypersomnia: Secondary | ICD-10-CM

## 2023-03-01 MED ORDER — RYBELSUS 3 MG PO TABS: 3.0000 mg | ORAL_TABLET | Freq: Every day | ORAL | 0 refills | Status: DC

## 2023-03-01 NOTE — Addendum Note (Signed)
 Addended by: Dionicio Stall on: 03/01/2023 08:48 AM   Modules accepted: Orders

## 2023-03-01 NOTE — Progress Notes (Signed)
   03/01/2023 Name: [name] MRN: [id number] DOB: 1955-09-10  No chief complaint on file.   [name] is a 68 y.o. year old female who presented for a telephone visit.   They were referred to the pharmacist by a quality report  ([employer]ric [employer]) for assistance in managing diabetes.  The purpose of today's call is to follow up on our call from last week.   Care Team: Primary Care Provider: Tonita Fallow, MD ; Next Scheduled Visit: 02/06/205   Medication Access/Adherence  Current Pharmacy:  [id number] [place], [place] - [address] [place] [address] Phone: [phone] Fax: [phone]  [facility] - [place], [place] - [address] [place] [address] Phone: [phone] Fax: [phone]  [id number] - [place],  - [address] AT [facility] OF [address] [place] [address] Phone: [phone] Fax: [phone]   Patient reports affordability concerns with their medications: No  Patient reports access/transportation concerns to their pharmacy: No  Patient reports adherence concerns with their medications:  No     Diabetes:  Current medications:  Medications tried in the past:   Current glucose readings: patient is not testing her blood sugar.  She said she would use her husband's meter.  Current meal patterns:  - Breakfast: pork chop biscuit for breakfast.   - Lunch [employer] Patient did not keep a food diary as requested.  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  It required a PA.  The PA went through but the copay is $302  Attempted to call the Patient back to discuss but she did not answer the phone and a message could not be left.  Objective:  Lab Results  Component Value Date   HGBA1C 10.4  (H) 02/24/2023    Lab Results  Component Value Date   CREATININE 0.55 02/24/2023   BUN 20 02/24/2023   NA 139 02/24/2023   K 4.9 02/24/2023   CL 100 02/24/2023   CO2 29 02/24/2023    Lab Results  Component Value Date   CHOL 111 02/24/2023   HDL 55 02/24/2023   LDLCALC 41 02/24/2023   TRIG 66 02/24/2023   CHOLHDL 2.0 02/24/2023    Medications Reviewed Today   Medications were not reviewed in this encounter       Assessment/Plan:   Diabetes: - Currently uncontrolled-Rybelsus  PA was approved but the copay is $302 due to a deductible.  - Reviewed long term cardiovascular and renal outcomes of uncontrolled blood sugar - Reviewed goal A1c, goal fasting, and goal 2 hour post prandial glucose - Reviewed dietary modifications including decreasing portion sizes. - Reviewed lifestyle modifications including: getting enough rest--patient reported sleeping 3-4 hours per day then using 5-hour energy  drinks to make it through the night at work.  Unclear if patient meets financial criteria for Rybelsus  patient assistance program through [employer]. Will collaborate with provider, CPhT, and patient to pursue assistance.     Follow Up Plan: Follow up with the patient on Medication Assistance.      Cassius DOROTHA Brought, PharmD, BCACP [facility] Clinical Pharmacist [phone]

## 2023-03-01 NOTE — Progress Notes (Signed)
   03/01/2023  Patient ID: [name], female   DOB: Sep 22, 1955, 68 y.o.   MRN: [id number]

## 2023-03-02 ENCOUNTER — Telehealth: Payer: Self-pay | Admitting: Pharmacist

## 2023-03-02 DIAGNOSIS — E1122 Type 2 diabetes mellitus with diabetic chronic kidney disease: Secondary | ICD-10-CM

## 2023-03-02 NOTE — Procedures (Signed)
   [facility]  HOME SLEEP TEST (Watch PAT) REPORT  STUDY DATE: 03/01/23  DOB: 10/05/1955  MRN: [id number]  ORDERING CLINICIAN: True Mar, MD, PhD   REFERRING CLINICIAN: Tonita Fallow, MD   CLINICAL INFORMATION/HISTORY: 68 year-old female with an underlying medical history of allergies, asthma, bicuspid aortic valve, diabetes, degenerative joint disease, hypertension, hyperlipidemia, vitamin D  deficiency, palpitations and overweight state, who reports witnessed apneas and daytime somnolence.   Epworth sleepiness score: 20/24.  BMI: 26.1 kg/m  FINDINGS:   Sleep Summary:   Total Recording Time (hours, min): 7 hours, 40 min  Total Sleep Time (hours, min):  6 hours, 55 min  Percent REM (%):    25.6%   Respiratory Indices:   Calculated pAHI (per hour):  12.9/hour         REM pAHI:    37.9/hour       NREM pAHI: 4.3/hour  Central pAHI: 0/hour  Oxygen Saturation Statistics:    Oxygen Saturation (%) Mean: 95%   Minimum oxygen saturation (%):                 81%   O2 Saturation Range (%): 81 - 99%    O2 Saturation (minutes) <=88%: 1.2 min  Pulse Rate Statistics:   Pulse Mean (bpm):    89/min    Pulse Range (75 - 109/min)   IMPRESSION: OSA (obstructive sleep apnea), mild   RECOMMENDATION:  This home sleep test demonstrates overall mild obstructive sleep apnea with a total AHI of 12.9/hour and O2 nadir of 81%.  Overall mild snoring was detected, at times moderate, rarely in the severe range.  Given the patient's medical history and sleep related complaints, therapy with a positive airway pressure device can be considered.  Treatment can be achieved in the form of autoPAP trial/titration at home for now. A full night, in-lab PAP titration study may aid in improving proper treatment settings and with mask fit, if needed, down the road. Alternative treatments may include weight loss (where appropriate) along with avoidance of the supine sleep  position (if possible), or an oral appliance in appropriate candidates.   Please note that untreated obstructive sleep apnea may carry additional perioperative morbidity. Patients with significant obstructive sleep apnea should receive perioperative PAP therapy and the surgeons and particularly the anesthesiologist should be informed of the diagnosis and the severity of the sleep disordered breathing. The patient should be cautioned not to drive, work at heights, or operate dangerous or heavy equipment when tired or sleepy. Review and reiteration of good sleep hygiene measures should be pursued with any patient. Other causes of the patient's symptoms, including circadian rhythm disturbances, an underlying mood disorder, medication effect and/or an underlying medical problem cannot be ruled out based on this test. Clinical correlation is recommended.  The patient and her referring provider will be notified of the test results. The patient will be seen in follow up in sleep clinic at [facility], as necessary.  I certify that I have reviewed the raw data recording prior to the issuance of this report in accordance with the standards of the American Academy of Sleep Medicine (AASM).  INTERPRETING PHYSICIAN:   True Mar, MD, PhD Medical Director, [facility]) Diplomat, ABPN (Neurology and Sleep)   [facility] [address] [place], [place] [address] [phone]

## 2023-03-02 NOTE — Addendum Note (Signed)
 Addended by: Ural Acree on: 03/02/2023 06:35 PM   Modules accepted: Orders

## 2023-03-02 NOTE — Progress Notes (Signed)
 See procedure note.

## 2023-03-02 NOTE — Progress Notes (Signed)
 03/02/2023  Patient ID: [name], female   DOB: Oct 21, 1955, 68 y.o.   MRN: [id number]   03/02/2023  [name] [birth year] [id number]  Reason for referral: Medication Assistance  Referral medication(s): Rybelsus  Current insurance:[employer]   [name] is a 68 year old female with multiple medical conditions. We have been working specifically on her uncontrolled type 2 diabetes.  She was recently started on Rybelsus .  The copay was $302.  Although the patient reported paying for the Rybelsus  this time, she does not feel like she can do this monthly.  The cost is due to her having a deductible. However, since a PA was required to get the Rybelsus , it is not clear what her actual copay will be.   After initial financial assessment, She may qualify to receive Rybelsus  through [employer]'s Patient Assistance Program.   Objective: Allergies  Allergen Reactions   Naprosyn [Naproxen] Other (See Comments)    GI upset    Medications Reviewed Today     Reviewed by Geronimo Krabbe, [facility] (Pharmacist) on 03/02/23 at 1529  Med List Status: <None>   Medication Order Taking? Sig Documenting Provider Last Dose Status Informant  albuterol  (VENTOLIN  HFA) 108 (90 Base) MCG/ACT inhaler [id number]  Use  1 to 2 inhalations  15 minutes  apart every 4 hours if needed to rescue asthma Anna Genet, MD  Expired 02/24/23 2359            Med Note (SOTO, CHRISTINA L   Tue Feb 16, 2022  1:23 PM) prn  aspirin  EC 81 MG tablet [id number] Yes Take 81 mg by mouth daily. [provider] Taking Active Multiple Informants  B Complex-C (SUPER B COMPLEX PO) [id number] Yes Take 1 tablet by mouth daily. Takes every other day [provider] Taking Active Multiple Informants  blood glucose meter kit and supplies KIT [id number] Yes Dispense based on patient and insurance preference. Use up to four times daily as directed. Langley Pippin, NP Taking Active   cetirizine (ZYRTEC) 10 MG chewable  tablet [id number] Yes Chew 10 mg by mouth every other day. [provider] Taking Active   Cholecalciferol (VITAMIN D  PO) [id number] Yes Take 2 capsules by mouth daily. Take 5000 units by mouth twice a day [provider] Taking Active Self  cyclobenzaprine  (FLEXERIL ) 10 MG tablet [id number] Yes TAKE 1/2 TO 1 TABLET AT BEDTIME FOR MUSCLE SPASM Wilkinson, Dana E, NP Taking Active   docusate sodium (COLACE) 100 MG capsule [id number] Yes Take 100 mg by mouth daily. [provider] Taking Active   empagliflozin  (JARDIANCE ) 25 MG TABS tablet [id number] No Take 1 tab prior to breakfast daily for diabetes.  Patient not taking: Reported on 03/02/2023   Parker Bureau, NP Not Taking Active   ferrous sulfate 325 (65 FE) MG tablet [id number] Yes Take by mouth. [provider] Taking Active   gabapentin  (NEURONTIN ) 600 MG tablet [id number] Yes TAKE 1/2 TO 1 TABLET 2 TO 3 TIMES DAILY AS NEEDED FOR NEUROPATHY PAIN Anna Genet, MD Taking Active   lisinopril -hydrochlorothiazide  (ZESTORETIC ) 10-12.5 MG tablet [id number] Yes TAKE 1 TABLET DAILY FOR BLOOD PRESSURE AND DIABETIC KIDNEY PROTECTION Wilkinson, Dana E, NP Taking Active   meloxicam  (MOBIC ) 15 MG tablet [id number] Yes TAKE 1/2 TO 1 TABLET DAILY WITH FOOD FOR PAIN AND INFLAMMATION Wilkinson, Dana E, NP Taking Active   METAMUCIL FIBER PO [id number] Yes Take 1 tablet by mouth daily. [provider] Taking Active   metFORMIN  (GLUCOPHAGE -XR) 500 MG 24  hr tablet [id number] Yes TAKE 2 TABLETS TWICE A DAY WITH MEALS Wilkinson, Dana E, NP Taking Active   metoprolol  succinate (TOPROL -XL) 25 MG 24 hr tablet [id number] Yes TAKE 1 TABLET EVERY DAY FOR BLOOD PRESSURE Wilkinson, Dana E, NP Taking Active   mometasone  (NASONEX ) 50 MCG/ACT nasal spray [id number]  Place 2 sprays into the nose daily.  Patient taking differently: Place 2 sprays into the nose as needed.   Anna Genet, MD  Expired 02/24/23 2359   Multiple Vitamin  (MULITIVITAMIN WITH MINERALS) TABS [id number] Yes Take 1 tablet by mouth daily. [provider] Taking Active Multiple Informants           Med Note Orvil [name], Anna   Fri Mar 24, 2018  8:50 AM)    pantoprazole  (PROTONIX ) 40 MG tablet [id number] Yes TAKE 1 TABLET BY MOUTH ONCE DAILY TO PREVENT INDIGESTION AND HEARTBURN  Patient taking differently: TAKE 1 TABLET BY MOUTH ONCE DAILY TO  PREVENT  INDIGESTION  AND  HEARTBURN   Wilkinson, Dana E, NP Taking Active   potassium chloride  SA (KLOR-CON  M) 20 MEQ tablet [id number] Yes Take 1 tablet 3 x /day with Meals for Potassium                                                                /                                           TAKE                                      BY                                       MOUTH Anna Genet, MD Taking Active   predniSONE  (DELTASONE ) 10 MG tablet [id number] Yes 1 tab 3 x day for 2 days, then 1 tab 2 x day for 2 days, then 1 tab 1 x day for 3 days Langley Pippin, NP Taking Active   rOPINIRole  (REQUIP ) 1 MG tablet [id number] Yes Take  1 tablet 3 x / day as Needed for Restless Legs        /       TAKE      BY      Anna Leff, MD Taking Active   rosuvastatin  (CRESTOR ) 20 MG tablet [id number] Yes TAKE 1 TABLET EVERY DAY FOR CHOLESTEROL Wilkinson, Dana E, NP Taking Active   Semaglutide  (RYBELSUS ) 3 MG TABS [id number] Yes Take 1 tablet (3 mg total) by mouth daily. Langley Pippin, NP Taking Active   Turmeric (QC TUMERIC COMPLEX PO) [id number] Yes Take 1 capsule by mouth daily. [provider] Taking Active   Zinc 30 MG CAPS [id number] Yes Take 1 capsule by mouth daily. [provider] Taking Active             Assessment:    Medication Review Findings:  Patient reported checking her blood sugar this morning and it was 253  mg/dl We discussed continuing to decrease her carbohydrate intake. She picked up Rybelsus  but the copay will be cost prohibitive. From initial review, Patient  appears to qualify to receive Rybelsus  through [employer]'s program. She refused MyChart.   Plan: I will route patient assistance letter to pharmacy technician who will coordinate patient assistance program application process for medications listed above.  Pharmacy technician will assist with obtaining all required documents from both patient and provider(s) and submit application(s) once completed.    Geronimo Krabbe, PharmD, BCACP [facility] Clinical Pharmacist [phone]

## 2023-03-03 ENCOUNTER — Other Ambulatory Visit: Payer: Self-pay | Admitting: Nurse Practitioner

## 2023-03-08 ENCOUNTER — Telehealth: Payer: Self-pay | Admitting: *Deleted

## 2023-03-08 DIAGNOSIS — E119 Type 2 diabetes mellitus without complications: Secondary | ICD-10-CM | POA: Diagnosis not present

## 2023-03-08 DIAGNOSIS — H1789 Other corneal scars and opacities: Secondary | ICD-10-CM | POA: Diagnosis not present

## 2023-03-08 LAB — HM DIABETES EYE EXAM

## 2023-03-08 NOTE — Telephone Encounter (Signed)
Informed pt of their sleep study results. The study showed mild OSA . Per Dr. Teofilo Pod recommendations, the pt was advised to start autopap therapy at home. She has verbalized understanding and agreement to proceed with autopap. Her questions were answered. We discussed the insurance compliance requirements which includes using the machine at least 4 hours at night and also being seen by our office between 30 and 90 days after setup. The pt scheduled initial f/u for 05/30/23 at 9:00 AM. Discussed DME, will refer to [facility]. Pt verbalized appreciation for the call.    Result sent to referring provider. Order sent to [facility]

## 2023-03-08 NOTE — Telephone Encounter (Signed)
-----   Message from Huston Foley sent at 03/02/2023  6:35 PM EST ----- Patient referred by PCP, seen by me on 01/05/2023, patient had HST on 03/01/2023.    Please call and notify the patient that the recent home sleep test showed obstructive sleep apnea. OSA is overall mild, but worth treating to see if she feels better after treatment. To that end I recommend treatment for this in the form of autoPAP, which means, that we don't have to bring her in for a sleep study with CPAP, but will let her try an autoPAP machine at home, through a DME company (of her choice, or as per insurance requirement). The DME representative will educate her on how to use the machine, how to put the mask on, etc. I have placed an order in the chart. Please send referral, talk to patient, send report to referring MD. We will need a FU in sleep clinic in about 2.-3 months post-PAP set up (which is usually an insurance-mandated appointment to monitor compliance), please arrange that with me or one of our NPs. Please also go over the need for compliance with treatment (including the insurance-imposed minimum compliance percentage). Thanks,   Huston Foley, MD, PhD [facility])

## 2023-03-08 NOTE — Telephone Encounter (Signed)
New, Maryella Shivers, Otilio Jefferson, RN; Alain Honey; Jeris Penta, Luis Llorons Torres; 1 other Received, thank you!

## 2023-03-14 ENCOUNTER — Telehealth: Payer: Self-pay | Admitting: Pharmacy Technician

## 2023-03-14 DIAGNOSIS — Z5986 Financial insecurity: Secondary | ICD-10-CM

## 2023-03-14 NOTE — Progress Notes (Addendum)
Pharmacy Medication Assistance Program Note    03/14/2023  Patient ID: [name], female   DOB: [birth year], 68 y.o.   MRN: [id number]     03/14/2023  Outreach Medication One  Initial Outreach Date (Medication One) 03/10/2023  Manufacturer Medication One [employer] [facility] Rybelsus  Dose of Rybelsus 7mg   Type of Radiographer, therapeutic Assistance  Date Application Sent to Patient 03/16/2023  Application Items Requested Application;Proof of Income;Other  Date Application Sent to Prescriber 03/16/2023  Name of Prescriber Adela Glimpse   [facility] 03/30/2023 Unsuccessful outreach to patient in regard to Rybelsus application with [employer]. Unfortunately patient did not answer the phone. Voicemail was NOT able to be left as patient's voicemail is not set up. Will also have to confirmed patient's new PCP.  [name], CPhT [place]  Office: [phone] Fax: [phone] Email: [email or web address]

## 2023-03-24 ENCOUNTER — Encounter: Payer: Medicare Other | Admitting: Internal Medicine

## 2023-03-29 ENCOUNTER — Other Ambulatory Visit: Payer: Self-pay

## 2023-03-29 ENCOUNTER — Encounter: Payer: Self-pay | Admitting: *Deleted

## 2023-03-29 DIAGNOSIS — E1122 Type 2 diabetes mellitus with diabetic chronic kidney disease: Secondary | ICD-10-CM

## 2023-03-29 MED ORDER — RYBELSUS 3 MG PO TABS: 3.0000 mg | ORAL_TABLET | Freq: Every day | ORAL | 0 refills | Status: DC

## 2023-04-01 ENCOUNTER — Telehealth: Payer: Self-pay

## 2023-04-01 NOTE — Telephone Encounter (Signed)
Pls advise see msg and advise?

## 2023-04-01 NOTE — Telephone Encounter (Signed)
Copied from CRM [id number]. Topic: Appointments - Transfer of Care >> Apr 01, 2023 11:26 AM Antwanette L wrote: Pt is requesting to transfer FROM: Dr. Lucky Cowboy Pt is requesting to transfer TO: Kurtis Bushman Reason for requested transfer: Provider passed away It is the responsibility of the team the patient would like to transfer to [facility] FNP) to reach out to the patient if for any reason this transfer is not acceptable.

## 2023-04-04 DIAGNOSIS — G4733 Obstructive sleep apnea (adult) (pediatric): Secondary | ICD-10-CM | POA: Diagnosis not present

## 2023-04-25 ENCOUNTER — Other Ambulatory Visit: Payer: Self-pay | Admitting: Family

## 2023-04-25 DIAGNOSIS — E1122 Type 2 diabetes mellitus with diabetic chronic kidney disease: Secondary | ICD-10-CM

## 2023-04-28 ENCOUNTER — Other Ambulatory Visit: Payer: Self-pay

## 2023-04-28 DIAGNOSIS — E1122 Type 2 diabetes mellitus with diabetic chronic kidney disease: Secondary | ICD-10-CM

## 2023-04-28 MED ORDER — RYBELSUS 3 MG PO TABS: 3.0000 mg | ORAL_TABLET | Freq: Every day | ORAL | 0 refills | Status: DC

## 2023-05-19 ENCOUNTER — Ambulatory Visit (INDEPENDENT_AMBULATORY_CARE_PROVIDER_SITE_OTHER): Payer: Medicare Other | Admitting: Family Medicine

## 2023-05-19 ENCOUNTER — Encounter: Payer: Self-pay | Admitting: Family Medicine

## 2023-05-19 ENCOUNTER — Ambulatory Visit: Payer: Medicare Other | Admitting: Family Medicine

## 2023-05-19 VITALS — BP 122/68 | HR 83 | Temp 98.3°F | Ht <= 58 in | Wt 120.6 lb

## 2023-05-19 DIAGNOSIS — Z0001 Encounter for general adult medical examination with abnormal findings: Secondary | ICD-10-CM | POA: Diagnosis not present

## 2023-05-19 DIAGNOSIS — I10 Essential (primary) hypertension: Secondary | ICD-10-CM

## 2023-05-19 DIAGNOSIS — Z79899 Other long term (current) drug therapy: Secondary | ICD-10-CM

## 2023-05-19 DIAGNOSIS — E1169 Type 2 diabetes mellitus with other specified complication: Secondary | ICD-10-CM

## 2023-05-19 DIAGNOSIS — N181 Chronic kidney disease, stage 1: Secondary | ICD-10-CM

## 2023-05-19 DIAGNOSIS — E1122 Type 2 diabetes mellitus with diabetic chronic kidney disease: Secondary | ICD-10-CM

## 2023-05-19 DIAGNOSIS — M25441 Effusion, right hand: Secondary | ICD-10-CM | POA: Insufficient documentation

## 2023-05-19 DIAGNOSIS — E785 Hyperlipidemia, unspecified: Secondary | ICD-10-CM

## 2023-05-19 DIAGNOSIS — E559 Vitamin D deficiency, unspecified: Secondary | ICD-10-CM

## 2023-05-19 DIAGNOSIS — E663 Overweight: Secondary | ICD-10-CM

## 2023-05-19 DIAGNOSIS — K219 Gastro-esophageal reflux disease without esophagitis: Secondary | ICD-10-CM | POA: Diagnosis not present

## 2023-05-19 DIAGNOSIS — Z0000 Encounter for general adult medical examination without abnormal findings: Secondary | ICD-10-CM | POA: Insufficient documentation

## 2023-05-19 DIAGNOSIS — Z7984 Long term (current) use of oral hypoglycemic drugs: Secondary | ICD-10-CM

## 2023-05-19 NOTE — Assessment & Plan Note (Signed)
 Last A1c 10.4%. Started on Rybelsus 3mg  daily. Fasting labs with A1c next week. A1c and uACR ordered. Foot exam today. Vaccines UTD. Retinal eye exam UTD. Recommend heart healthy diet such as Mediterranean diet with whole grains, fruits, vegetable, fish, lean meats, nuts, and olive oil. Limit salt. Encouraged moderate walking, 3-5 times/week for 30-50 minutes each session. Aim for at least 150 minutes.week. Goal should be pace of 3 miles/hours, or walking 1.5 miles in 30 minutes. Seek medical care for urinary frequency, extreme thirst, vision changes, lightheadedness, dizziness.  Follow up in 1 week for A1c check

## 2023-05-19 NOTE — Patient Instructions (Signed)
 It was great to meet you today and I'm excited to have you join the [facility] practice. I hope you had a positive experience today! If you feel so inclined, please feel free to recommend our practice to friends and family. Kurtis Bushman, FNP-C

## 2023-05-19 NOTE — Assessment & Plan Note (Signed)
 Uric acid, crp, ana, rf ordered. Continue Meloxicam.

## 2023-05-19 NOTE — Assessment & Plan Note (Signed)
 Chronic well controlled. Continue Lisinopril hydrochlorothiazide 10-12.5mg  daily and Metop Succinate 25mg  daily. Recommend heart healthy diet such as Mediterranean diet with whole grains, fruits, vegetable, fish, lean meats, nuts, and olive oil. Limit salt. Encouraged moderate walking, 3-5 times/week for 30-50 minutes each session. Aim for at least 150 minutes.week. Goal should be pace of 3 miles/hours, or walking 1.5 miles in 30 minutes. Avoid tobacco products. Avoid excess alcohol. Take medications as prescribed and bring medications and blood pressure log with cuff to each office visit. Seek medical care for chest pain, palpitations, shortness of breath with exertion, dizziness/lightheadedness, vision changes, recurrent headaches, or swelling of extremities. Follow up in 6 months

## 2023-05-19 NOTE — Assessment & Plan Note (Signed)

## 2023-05-19 NOTE — Progress Notes (Signed)
 New Patient Office Visit  Subjective    Patient ID: [name], female    DOB: [birth year]  Age: 68 y.o. MRN: [id number]  CC:  Chief Complaint  Patient presents with   Establish Care    HPI [name] presents to establish care. Oriented to practice routines and expectations. Has been seeing a PCP regularly, last CPE 02/18/2022. PMH includes HTN, HLD, DM2, vitamin D deficiency, and GERD. Current concerns include right first PIP swelling and pain. This is chronic for several months. Denies warmth or redness. Has tried nothing.  HYPERTENSION / HYPERLIPIDEMIA Satisfied with current treatment? yes Duration of hypertension: chronic BP monitoring frequency: not checking BP range:  BP medication side effects: no Past BP meds: lisinopril-HCTZ,. metoprolol Duration of hyperlipidemia: chronic Cholesterol medication side effects: no Cholesterol supplements: none Past cholesterol medications: rosuvastatin (crestor) Medication compliance: excellent compliance Aspirin: yes Recent stressors: no Recurrent headaches: yes Visual changes: no Palpitations: no Dyspnea: no Chest pain: no Lower extremity edema: no Dizzy/lightheaded: no  DIABETES Hypoglycemic episodes:no Polydipsia/polyuria: no Visual disturbance: no Chest pain: no Paresthesias: no Glucose Monitoring: no  Accucheck frequency: Not Checking  Fasting glucose:  Post prandial:  Evening:  Before meals: Taking Insulin?: no  Long acting insulin:  Short acting insulin: Blood Pressure Monitoring: not checking Retinal Examination: Up to Date Foot Exam:  today Diabetic Education: Completed Pneumovax: Up to Date Influenza: Up to Date Aspirin: yes    Health Maintenance  Topic Date Due   Diabetic kidney evaluation - Urine ACR  02/19/2023   COVID-19 Vaccine (6 - Mixed Product risk 2024-25 season) 05/18/2023   Medicare Annual Wellness (AWV)  05/28/2023   HEMOGLOBIN A1C  08/24/2023   INFLUENZA VACCINE   09/16/2023   Diabetic kidney evaluation - eGFR measurement  02/24/2024   OPHTHALMOLOGY EXAM  03/07/2024   FOOT EXAM  05/18/2024   MAMMOGRAM  06/30/2024   DTaP/Tdap/Td (3 - Tdap) 10/10/2024   Colonoscopy  01/27/2025   Pneumonia Vaccine 59+ Years old  Completed   DEXA SCAN  Completed   Hepatitis C Screening  Completed   Zoster Vaccines- Shingrix  Completed   HPV VACCINES  Aged Out      Outpatient Encounter Medications as of 05/19/2023  Medication Sig   aspirin EC 81 MG tablet Take 81 mg by mouth daily.   B Complex-C (SUPER B COMPLEX PO) Take 1 tablet by mouth daily. Takes every other day   blood glucose meter kit and supplies KIT Dispense based on patient and insurance preference. Use up to four times daily as directed.   cetirizine (ZYRTEC) 10 MG chewable tablet Chew 10 mg by mouth every other day.   Cholecalciferol (VITAMIN D PO) Take 2 capsules by mouth daily. Take 5000 units by mouth twice a day   cyclobenzaprine (FLEXERIL) 10 MG tablet TAKE 1/2 TO 1 TABLET AT BEDTIME FOR MUSCLE SPASM   docusate sodium (COLACE) 100 MG capsule Take 100 mg by mouth daily.   ferrous sulfate 325 (65 FE) MG tablet Take by mouth.   gabapentin (NEURONTIN) 600 MG tablet TAKE 1/2 TO 1 TABLET 2 TO 3 TIMES DAILY AS NEEDED FOR NEUROPATHY PAIN   lisinopril-hydrochlorothiazide (ZESTORETIC) 10-12.5 MG tablet TAKE 1 TABLET DAILY FOR BLOOD PRESSURE AND DIABETIC KIDNEY PROTECTION   meloxicam (MOBIC) 15 MG tablet TAKE 1/2 TO 1 TABLET DAILY WITH FOOD FOR PAIN AND INFLAMMATION   METAMUCIL FIBER PO Take 1 tablet by mouth daily.   metFORMIN (GLUCOPHAGE-XR) 500 MG 24 hr tablet TAKE  2 TABLETS TWICE A DAY WITH MEALS   metoprolol succinate (TOPROL-XL) 25 MG 24 hr tablet TAKE 1 TABLET EVERY DAY FOR BLOOD PRESSURE   Multiple Vitamin (MULITIVITAMIN WITH MINERALS) TABS Take 1 tablet by mouth daily.   pantoprazole (PROTONIX) 40 MG tablet TAKE 1 TABLET BY MOUTH ONCE DAILY TO PREVENT INDIGESTION AND HEARTBURN   potassium chloride SA  (KLOR-CON M) 20 MEQ tablet Take 1 tablet 3 x /day with Meals for Potassium                                                                /                                           TAKE                                      BY                                       MOUTH   predniSONE (DELTASONE) 10 MG tablet 1 tab 3 x day for 2 days, then 1 tab 2 x day for 2 days, then 1 tab 1 x day for 3 days   rOPINIRole (REQUIP) 1 MG tablet Take  1 tablet 3 x / day as Needed for Restless Legs        /       TAKE      BY      MOUTH   rosuvastatin (CRESTOR) 20 MG tablet TAKE 1 TABLET EVERY DAY FOR CHOLESTEROL   Semaglutide (RYBELSUS) 3 MG TABS Take 1 tablet (3 mg total) by mouth daily.   Turmeric (QC TUMERIC COMPLEX PO) Take 1 capsule by mouth daily.   Zinc 30 MG CAPS Take 1 capsule by mouth daily.   empagliflozin (JARDIANCE) 25 MG TABS tablet Take 1 tab prior to breakfast daily for diabetes. (Patient not taking: Reported on 05/19/2023)   mometasone (NASONEX) 50 MCG/ACT nasal spray Place 2 sprays into the nose daily. (Patient taking differently: Place 2 sprays into the nose as needed.)   [DISCONTINUED] albuterol (VENTOLIN HFA) 108 (90 Base) MCG/ACT inhaler Use  1 to 2 inhalations  15 minutes  apart every 4 hours if needed to rescue asthma   No facility-administered encounter medications on file as of 05/19/2023.    Past Medical History:  Diagnosis Date   Allergic rhinitis    ASTHMA    BICUSPID AORTIC VALVE    CEREBROVASCULAR DISEASE    DEGENERATIVE JOINT DISEASE    Diabetes mellitus    DJD (degenerative joint disease)    GERD    HSV-1 (herpes simplex virus 1) infection    HYPERLIPIDEMIA    Hypertension    Palpitation    Vitamin D deficiency     Past Surgical History:  Procedure Laterality Date   ABDOMINAL HYSTERECTOMY     repair of trigger finger      Family History  Problem Relation Age of  Onset   Coronary artery disease Mother    Heart disease Mother    Hypertension Mother    Diabetes Father     Hypertension Father    Breast cancer Maternal Aunt    Sleep apnea Neg Hx     Social History   Socioeconomic History   Marital status: Married    Spouse name: Not on file   Number of children: Not on file   Years of education: Not on file   Highest education level: Not on file  Occupational History   Not on file  Tobacco Use   Smoking status: Never   Smokeless tobacco: Never  Vaping Use   Vaping status: Never Used  Substance and Sexual Activity   Alcohol use: Not Currently   Drug use: No   Sexual activity: Not on file  Other Topics Concern   Not on file  Social History Narrative   Not on file   Social Drivers of Health   Financial Resource Strain: Not on file  Food Insecurity: Not on file  Transportation Needs: Not on file  Physical Activity: Not on file  Stress: Not on file  Social Connections: Not on file  Intimate Partner Violence: Not on file    Review of Systems  Constitutional: Negative.   HENT: Negative.    Eyes: Negative.   Respiratory: Negative.    Cardiovascular: Negative.   Gastrointestinal: Negative.   Genitourinary: Negative.   Musculoskeletal:  Positive for joint pain (right hand).  Skin: Negative.   Neurological: Negative.   Endo/Heme/Allergies: Negative.   Psychiatric/Behavioral: Negative.    All other systems reviewed and are negative.       Objective    BP 122/68   Pulse 83   Temp 98.3 F (36.8 C)   Ht 4\' 10"  (1.473 m)   Wt 120 lb 9.6 oz (54.7 kg)   SpO2 96%   BMI 25.21 kg/m   Physical Exam Vitals and nursing note reviewed.  Constitutional:      Appearance: Normal appearance. She is overweight.  HENT:     Head: Normocephalic and atraumatic.     Right Ear: Tympanic membrane, ear canal and external ear normal.     Left Ear: Tympanic membrane, ear canal and external ear normal.     Nose: Nose normal.     Mouth/Throat:     Mouth: Mucous membranes are moist.     Pharynx: Oropharynx is clear.  Eyes:     Extraocular  Movements: Extraocular movements intact.     Conjunctiva/sclera: Conjunctivae normal.     Pupils: Pupils are equal, round, and reactive to light.     Comments: Right exotropia  Cardiovascular:     Rate and Rhythm: Normal rate and regular rhythm.     Pulses: Normal pulses.     Heart sounds: Normal heart sounds.  Pulmonary:     Effort: Pulmonary effort is normal.     Breath sounds: Normal breath sounds.  Abdominal:     General: Bowel sounds are normal.     Palpations: Abdomen is soft.  Musculoskeletal:        General: Normal range of motion.     Right hand: Swelling present.     Cervical back: Normal range of motion and neck supple.     Comments: PIP joint swelling  Skin:    General: Skin is warm and dry.     Capillary Refill: Capillary refill takes less than 2 seconds.  Neurological:     General:  No focal deficit present.     Mental Status: She is alert and oriented to person, place, and time. Mental status is at baseline.  Psychiatric:        Mood and Affect: Mood normal.        Behavior: Behavior normal.        Thought Content: Thought content normal.        Judgment: Judgment normal.    Diabetic Foot Exam - Simple   Simple Foot Form Visual Inspection No deformities, no ulcerations, no other skin breakdown bilaterally: Yes Sensation Testing Intact to touch and monofilament testing bilaterally: Yes Pulse Check Posterior Tibialis and Dorsalis pulse intact bilaterally: Yes Comments      Last CBC Lab Results  Component Value Date   WBC 7.5 02/24/2023   HGB 12.9 02/24/2023   HCT 39.3 02/24/2023   MCV 96.6 02/24/2023   MCH 31.7 02/24/2023   RDW 11.8 02/24/2023   PLT 255 02/24/2023   Last metabolic panel Lab Results  Component Value Date   GLUCOSE 167 (H) 02/24/2023   NA 139 02/24/2023   K 4.9 02/24/2023   CL 100 02/24/2023   CO2 29 02/24/2023   BUN 20 02/24/2023   CREATININE 0.55 02/24/2023   EGFR 100 02/24/2023   CALCIUM 10.0 02/24/2023   PROT 6.8  02/24/2023   ALBUMIN 3.6 11/25/2022   BILITOT 0.2 02/24/2023   ALKPHOS 79 11/25/2022   AST 17 02/24/2023   ALT 20 02/24/2023   ANIONGAP 11 11/25/2022   Last lipids Lab Results  Component Value Date   CHOL 111 02/24/2023   HDL 55 02/24/2023   LDLCALC 41 02/24/2023   TRIG 66 02/24/2023   CHOLHDL 2.0 02/24/2023   Last hemoglobin A1c Lab Results  Component Value Date   HGBA1C 10.4 (H) 02/24/2023   Last thyroid functions Lab Results  Component Value Date   TSH 1.01 12/08/2022   Last vitamin D Lab Results  Component Value Date   VD25OH 83 09/02/2022   Last vitamin B12 and Folate Lab Results  Component Value Date   VITAMINB12 721 01/08/2020        Assessment & Plan:   Problem List Items Addressed This Visit     GERD   Chronic well controlled on Protonix 40mg  daily. Anti-reflux measures such as raising the head of the bed, avoiding tight clothing or belts, avoiding eating late at night and not lying down shortly after mealtime and achieving weight loss are discussed. Avoid ASA, NSAID's, caffeine, peppermints, alcohol and tobacco. Alert me if there are persistent symptoms, dysphagia, weight loss or GI bleeding.       Relevant Orders   Magnesium   Hyperlipidemia associated with type 2 diabetes mellitus (HCC)   Continue Rosuvastatin 20mg  daily. Fasting labs next week. Your labs showed elevated cholesterol. I recommend consuming a heart healthy diet such as Mediterranean diet or DASH diet with whole grains, fruits, vegetable, fish, lean meats, nuts, and olive oil. Limit sweets and processed foods. I also encourage moderate intensity exercise 150 minutes weekly. This is 3-5 times weekly for 30-50 minutes each session. Goal should be pace of 3 miles/hours, or walking 1.5 miles in 30 minutes. The ASCVD Risk score (Arnett DK, et al., 2019) failed to calculate for the following reasons:   The valid total cholesterol range is 130 to 320 mg/dL       Relevant Orders   Lipid  panel   Vitamin D deficiency   Goal 60-100  Relevant Orders   VITAMIN D 25 Hydroxy (Vit-D Deficiency, Fractures)   Essential hypertension   Chronic well controlled. Continue Lisinopril hydrochlorothiazide 10-12.5mg  daily and Metop Succinate 25mg  daily. Recommend heart healthy diet such as Mediterranean diet with whole grains, fruits, vegetable, fish, lean meats, nuts, and olive oil. Limit salt. Encouraged moderate walking, 3-5 times/week for 30-50 minutes each session. Aim for at least 150 minutes.week. Goal should be pace of 3 miles/hours, or walking 1.5 miles in 30 minutes. Avoid tobacco products. Avoid excess alcohol. Take medications as prescribed and bring medications and blood pressure log with cuff to each office visit. Seek medical care for chest pain, palpitations, shortness of breath with exertion, dizziness/lightheadedness, vision changes, recurrent headaches, or swelling of extremities. Follow up in 6 months      Relevant Orders   CBC with Differential/Platelet   Comprehensive metabolic panel with GFR   Lipid panel   Hemoglobin A1c   Medication management   Relevant Orders   Magnesium   Type 2 diabetes with stage 1 chronic kidney disease GFR>90 (HCC)   Last A1c 10.4%. Started on Rybelsus 3mg  daily. Fasting labs with A1c next week. A1c and uACR ordered. Foot exam today. Vaccines UTD. Retinal eye exam UTD. Recommend heart healthy diet such as Mediterranean diet with whole grains, fruits, vegetable, fish, lean meats, nuts, and olive oil. Limit salt. Encouraged moderate walking, 3-5 times/week for 30-50 minutes each session. Aim for at least 150 minutes.week. Goal should be pace of 3 miles/hours, or walking 1.5 miles in 30 minutes. Seek medical care for urinary frequency, extreme thirst, vision changes, lightheadedness, dizziness.  Follow up in 1 week for A1c check      Relevant Orders   CBC with Differential/Platelet   Comprehensive metabolic panel with GFR   Lipid panel    Microalbumin / creatinine urine ratio   Vitamin B12   Physical exam, annual - Primary   Today your medical history was reviewed and routine physical exam with labs was performed. Recommend 150 minutes of moderate intensity exercise weekly and consuming a well-balanced diet. Advised to stop smoking if a smoker, avoid smoking if a non-smoker, limit alcohol consumption to 1 drink per day for women and 2 drinks per day for men, and avoid illicit drug use. Counseled on safe sex practices and offered STI testing today. Counseled on the importance of sunscreen use. Counseled in mental health awareness and when to seek medical care. Vaccine maintenance discussed. Appropriate health maintenance items reviewed. Return to office in 1 year for annual physical exam.       Overweight (BMI 25.0-29.9)   Counseled on importance of weight management for overall health. Encouraged low calorie, heart healthy diet and moderate intensity exercise 150 minutes weekly. This is 3-5 times weekly for 30-50 minutes each session. Goal should be pace of 3 miles/hours, or walking 1.5 miles in 30 minutes and include strength training.       Relevant Orders   CBC with Differential/Platelet   Comprehensive metabolic panel with GFR   Lipid panel   Swelling of joint of right hand   Uric acid, crp, ana, rf ordered. Continue Meloxicam.      Relevant Orders   Sedimentation rate   Rheumatoid factor   ANA Screen,IFA,Reflex Titer/Pattern,Reflex Mplx 11 Ab Cascade with IdentRA   Uric acid    Return in about 3 months (around 08/18/2023) for chronic follow-up with labs 1 week prior.   Park Meo, FNP

## 2023-05-19 NOTE — Assessment & Plan Note (Signed)
 Continue Rosuvastatin 20mg  daily. Fasting labs next week. Your labs showed elevated cholesterol. I recommend consuming a heart healthy diet such as Mediterranean diet or DASH diet with whole grains, fruits, vegetable, fish, lean meats, nuts, and olive oil. Limit sweets and processed foods. I also encourage moderate intensity exercise 150 minutes weekly. This is 3-5 times weekly for 30-50 minutes each session. Goal should be pace of 3 miles/hours, or walking 1.5 miles in 30 minutes. The ASCVD Risk score (Arnett DK, et al., 2019) failed to calculate for the following reasons:   The valid total cholesterol range is 130 to 320 mg/dL

## 2023-05-19 NOTE — Assessment & Plan Note (Signed)
 Goal 60-100

## 2023-05-19 NOTE — Assessment & Plan Note (Signed)
 Counseled on importance of weight management for overall health. Encouraged low calorie, heart healthy diet and moderate intensity exercise 150 minutes weekly. This is 3-5 times weekly for 30-50 minutes each session. Goal should be pace of 3 miles/hours, or walking 1.5 miles in 30 minutes and include strength training.

## 2023-05-19 NOTE — Assessment & Plan Note (Signed)
 Chronic well controlled on Protonix 40mg  daily. Anti-reflux measures such as raising the head of the bed, avoiding tight clothing or belts, avoiding eating late at night and not lying down shortly after mealtime and achieving weight loss are discussed. Avoid ASA, NSAID's, caffeine, peppermints, alcohol and tobacco. Alert me if there are persistent symptoms, dysphagia, weight loss or GI bleeding.

## 2023-05-23 ENCOUNTER — Other Ambulatory Visit

## 2023-05-23 ENCOUNTER — Telehealth: Payer: Self-pay | Admitting: Family Medicine

## 2023-05-23 NOTE — Telephone Encounter (Signed)
 Patient called to verify appointment. Reminded patient to bring CPAP machine and power cord to appointment

## 2023-05-26 NOTE — Progress Notes (Signed)
 PATIENT: [name] DOB: 12-17-1955  REASON FOR VISIT: follow up HISTORY FROM: patient  Chief Complaint  Patient presents with   Follow-up    Pt in 2 alone Pt hasn't worn the machine but 1 night Pt states an error message popped up about water reservoirPt didn't call DME       HISTORY OF PRESENT ILLNESS:  05/30/23 ALL:  [name] is a 68 y.o. female here today for follow up for OSA on CPAP.  She was seen in consult with Dr Omar Bibber 12/2022 for concerns of witnessed apneas and daytime sleepiness. HST showed mild obstructive sleep apnea with a total AHI of 12.9/hour and O2 nadir of 81%. AutoPAP advised.   Since, she reports using therapy for 1 night. She reports receiving an error regarding water reservoir. She has not reached out to [facility] regarding error message. She reports working nights and plans to switch to days soon. She feels she will be able to be more consistent with usage at that time.    HISTORY: (copied from Dr Dail Drought previous note)  Dear Dr. Cassondra Cliff,    I saw your patient, [name], upon your kind request in my sleep clinic today for initial consultation of her sleep disorder, in particular, concern for obstructive sleep apnea. The patient is unaccompanied today. As you know, the patient is a very pleasant 68 year-old female with an underlying medical history of allergies, asthma, bicuspid aortic valve, diabetes, degenerative joint disease, hypertension, hyperlipidemia, vitamin D deficiency, palpitations and overweight state, who reports witnessed apneas and daytime somnolence.  She is not sure if she snores.  Her husband does not complain.  Her mom has sleep apnea and she had a cousin with sleep apnea.  Epworth sleepiness score is 20 out of 24, fatigue severity score is 25 out of 63.  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  She generally snoring and excessive daytime somnolence as well as witnessed apneas per husband's report.  Her  Epworth sleepiness score is.  I reviewed your office note from 12/08/2022.  She has a TV in her bedroom but does not have to have it on at night or during the day.  They do not have any pets in the house.  She works third shift, generally from 6 PM to 6 AM or 6 PM to 4 AM.  She works from Sunday night through Friday morning.  She may go to bed around 6 AM if she gets home early or a little later when she gets home after 6.  She may sleep till 12 or 1 PM.  She denies nocturia.  She does not have recurrent nocturnal or morning headaches.  She drinks caffeine in the form of diet soda, usually 1/day.  She does not drink any alcohol.  She is a non-smoker.  She lives with her husband and son.  Her husband typically drives her to work and from work because she is sleepy.  Take Flexeril before falling asleep and half of the gabapentin.   REVIEW OF SYSTEMS: Out of a complete 14 system review of symptoms, the patient complains only of the following symptoms, none and all other reviewed systems are negative.  ESS: 9/24  ALLERGIES: Allergies  Allergen Reactions   Naprosyn [Naproxen] Other (See Comments)    GI upset    HOME MEDICATIONS: Outpatient Medications Prior to Visit  Medication Sig Dispense Refill   aspirin EC 81 MG tablet Take 81 mg by mouth  daily.     B Complex-C (SUPER B COMPLEX PO) Take 1 tablet by mouth daily. Takes every other day     blood glucose meter kit and supplies KIT Dispense based on patient and insurance preference. Use up to four times daily as directed. 1 each 0   cetirizine (ZYRTEC) 10 MG chewable tablet Chew 10 mg by mouth every other day.     Cholecalciferol (VITAMIN D PO) Take 2 capsules by mouth daily. Take 5000 units by mouth twice a day     cyclobenzaprine (FLEXERIL) 10 MG tablet TAKE 1/2 TO 1 TABLET AT BEDTIME FOR MUSCLE SPASM 60 tablet 5   docusate sodium (COLACE) 100 MG capsule Take 100 mg by mouth daily.     empagliflozin (JARDIANCE) 25 MG TABS tablet Take 1 tab prior  to breakfast daily for diabetes. 90 tablet 3   ferrous sulfate 325 (65 FE) MG tablet Take by mouth.     gabapentin (NEURONTIN) 600 MG tablet TAKE 1/2 TO 1 TABLET 2 TO 3 TIMES DAILY AS NEEDED FOR NEUROPATHY PAIN 270 tablet 3   lisinopril-hydrochlorothiazide (ZESTORETIC) 10-12.5 MG tablet TAKE 1 TABLET DAILY FOR BLOOD PRESSURE AND DIABETIC KIDNEY PROTECTION 90 tablet 3   meloxicam (MOBIC) 15 MG tablet TAKE 1/2 TO 1 TABLET DAILY WITH FOOD FOR PAIN AND INFLAMMATION 90 tablet 3   METAMUCIL FIBER PO Take 1 tablet by mouth daily.     metFORMIN (GLUCOPHAGE-XR) 500 MG 24 hr tablet TAKE 2 TABLETS TWICE A DAY WITH MEALS 360 tablet 3   metoprolol succinate (TOPROL-XL) 25 MG 24 hr tablet TAKE 1 TABLET EVERY DAY FOR BLOOD PRESSURE 90 tablet 3   Multiple Vitamin (MULITIVITAMIN WITH MINERALS) TABS Take 1 tablet by mouth daily.     pantoprazole (PROTONIX) 40 MG tablet TAKE 1 TABLET BY MOUTH ONCE DAILY TO PREVENT INDIGESTION AND HEARTBURN 90 tablet 3   potassium chloride SA (KLOR-CON M) 20 MEQ tablet Take 1 tablet 3 x /day with Meals for Potassium                                                                /                                           TAKE                                      BY                                       MOUTH 270 tablet 3   predniSONE (DELTASONE) 10 MG tablet 1 tab 3 x day for 2 days, then 1 tab 2 x day for 2 days, then 1 tab 1 x day for 3 days 13 tablet 0   rOPINIRole (REQUIP) 1 MG tablet Take  1 tablet 3 x / day as Needed for Restless Legs        /       TAKE  BY      MOUTH 270 tablet 3   rosuvastatin (CRESTOR) 20 MG tablet TAKE 1 TABLET EVERY DAY FOR CHOLESTEROL 90 tablet 3   Semaglutide (RYBELSUS) 3 MG TABS Take 1 tablet (3 mg total) by mouth daily. 30 tablet 0   Turmeric (QC TUMERIC COMPLEX PO) Take 1 capsule by mouth daily.     Zinc 30 MG CAPS Take 1 capsule by mouth daily.     mometasone (NASONEX) 50 MCG/ACT nasal spray Place 2 sprays into the nose daily. (Patient taking  differently: Place 2 sprays into the nose as needed.) 51 g 3   No facility-administered medications prior to visit.    PAST MEDICAL HISTORY: Past Medical History:  Diagnosis Date   Allergic rhinitis    ASTHMA    BICUSPID AORTIC VALVE    CEREBROVASCULAR DISEASE    DEGENERATIVE JOINT DISEASE    Diabetes mellitus    DJD (degenerative joint disease)    GERD    HSV-1 (herpes simplex virus 1) infection    HYPERLIPIDEMIA    Hypertension    Palpitation    Vitamin D deficiency     PAST SURGICAL HISTORY: Past Surgical History:  Procedure Laterality Date   ABDOMINAL HYSTERECTOMY     repair of trigger finger      FAMILY HISTORY: Family History  Problem Relation Age of Onset   Coronary artery disease Mother    Heart disease Mother    Hypertension Mother    Sleep apnea Mother    Diabetes Father    Hypertension Father    Breast cancer Maternal Aunt     SOCIAL HISTORY: Social History   Socioeconomic History   Marital status: Married    Spouse name: Not on file   Number of children: Not on file   Years of education: Not on file   Highest education level: Not on file  Occupational History   Not on file  Tobacco Use   Smoking status: Never   Smokeless tobacco: Never  Vaping Use   Vaping status: Never Used  Substance and Sexual Activity   Alcohol use: Not Currently   Drug use: No   Sexual activity: Not on file  Other Topics Concern   Not on file  Social History Narrative   Pt lives with husband    Pt works    Social Drivers of Corporate investment banker Strain: Not on Ship broker Insecurity: Not on file  Transportation Needs: Not on file  Physical Activity: Not on file  Stress: Not on file  Social Connections: Not on file  Intimate Partner Violence: Not on file     PHYSICAL EXAM  Vitals:   05/30/23 0818  BP: 138/68  Pulse: 86  Weight: 127 lb (57.6 kg)  Height: 4\' 9"  (1.448 m)   Body mass index is 27.48 kg/m.  Generalized: Well developed, in no  acute distress  Cardiology: normal rate and rhythm, no murmur noted Respiratory: clear to auscultation bilaterally  Neurological examination  Mentation: Alert oriented to time, place, history taking. Follows all commands speech and language fluent Cranial nerve II-XII: Pupils were equal round reactive to light. Extraocular movements were full, visual field were full, right eye deviates to right.  Motor: The motor testing reveals 5 over 5 strength of all 4 extremities. Good symmetric motor tone is noted throughout.  Gait and station: Gait is normal.    DIAGNOSTIC DATA (LABS, IMAGING, TESTING) - I reviewed patient records, labs, notes, testing and imaging  myself where available.      No data to display           Lab Results  Component Value Date   WBC 7.5 02/24/2023   HGB 12.9 02/24/2023   HCT 39.3 02/24/2023   MCV 96.6 02/24/2023   PLT 255 02/24/2023      Component Value Date/Time   NA 139 02/24/2023 1112   NA 139 02/22/2020 1108   K 4.9 02/24/2023 1112   CL 100 02/24/2023 1112   CO2 29 02/24/2023 1112   GLUCOSE 167 (H) 02/24/2023 1112   BUN 20 02/24/2023 1112   BUN 13 02/22/2020 1108   CREATININE 0.55 02/24/2023 1112   CALCIUM 10.0 02/24/2023 1112   PROT 6.8 02/24/2023 1112   ALBUMIN 3.6 11/25/2022 1450   AST 17 02/24/2023 1112   ALT 20 02/24/2023 1112   ALKPHOS 79 11/25/2022 1450   BILITOT 0.2 02/24/2023 1112   GFRNONAA >60 11/25/2022 1450   GFRNONAA 104 07/29/2020 1011   GFRAA 120 07/29/2020 1011   Lab Results  Component Value Date   CHOL 111 02/24/2023   HDL 55 02/24/2023   LDLCALC 41 02/24/2023   TRIG 66 02/24/2023   CHOLHDL 2.0 02/24/2023   Lab Results  Component Value Date   HGBA1C 10.4 (H) 02/24/2023   Lab Results  Component Value Date   VITAMINB12 721 01/08/2020   Lab Results  Component Value Date   TSH 1.01 12/08/2022     ASSESSMENT AND PLAN 69 y.o. year old female  has a past medical history of Allergic rhinitis, ASTHMA, BICUSPID  AORTIC VALVE, CEREBROVASCULAR DISEASE, DEGENERATIVE JOINT DISEASE, Diabetes mellitus, DJD (degenerative joint disease), GERD, HSV-1 (herpes simplex virus 1) infection, HYPERLIPIDEMIA, Hypertension, Palpitation, and Vitamin D deficiency. here with     ICD-10-CM   1. OSA on CPAP  G47.33        Selenia Mihok [name] has not started therapy due to an error message on CPAP machine. She was given contact information for [facility] and advised to reach out asap to have machine checked. Risks of untreated sleep apnea review and education materials provided. Healthy lifestyle habits encouraged. She will follow up in 4 months, sooner if needed. She verbalizes understanding and agreement with this plan.    No orders of the defined types were placed in this encounter.    No orders of the defined types were placed in this encounter.     Terrilyn Fick, FNP-C 05/30/2023, 8:46 AM [facility] [address] [place], [place] [address] [phone]

## 2023-05-26 NOTE — Patient Instructions (Addendum)
 Please contact [facility] to discuss any concerns with your machine. Please restart CPAP and continue regularly. While your insurance requires that you use CPAP at least 4 hours each night on 70% of the nights, I recommend, that you not skip any nights and use it throughout the night if you can. Getting used to CPAP and staying with the treatment long term does take time and patience and discipline. Untreated obstructive sleep apnea when it is moderate to severe can have an adverse impact on cardiovascular health and raise her risk for heart disease, arrhythmias, hypertension, congestive heart failure, stroke and diabetes. Untreated obstructive sleep apnea causes sleep disruption, nonrestorative sleep, and sleep deprivation. This can have an impact on your day to day functioning and cause daytime sleepiness and impairment of cognitive function, memory loss, mood disturbance, and problems focussing. Using CPAP regularly can improve these symptoms.  DME: [facility] Phone: [phone], press option 1  Follow up in 4 months

## 2023-05-27 ENCOUNTER — Other Ambulatory Visit

## 2023-05-30 ENCOUNTER — Ambulatory Visit: Payer: Medicare Other | Admitting: Nurse Practitioner

## 2023-05-30 ENCOUNTER — Encounter: Payer: Self-pay | Admitting: Family Medicine

## 2023-05-30 ENCOUNTER — Ambulatory Visit: Payer: Medicare Other | Admitting: Family Medicine

## 2023-05-30 VITALS — BP 138/68 | HR 86 | Ht <= 58 in | Wt 127.0 lb

## 2023-05-30 DIAGNOSIS — G4733 Obstructive sleep apnea (adult) (pediatric): Secondary | ICD-10-CM

## 2023-06-23 ENCOUNTER — Other Ambulatory Visit: Payer: Self-pay | Admitting: Family Medicine

## 2023-06-23 ENCOUNTER — Ambulatory Visit: Payer: Medicare Other | Admitting: Nurse Practitioner

## 2023-06-23 DIAGNOSIS — E1340 Other specified diabetes mellitus with diabetic neuropathy, unspecified: Secondary | ICD-10-CM

## 2023-06-23 DIAGNOSIS — M79604 Pain in right leg: Secondary | ICD-10-CM

## 2023-06-23 MED ORDER — ROPINIROLE HCL 1 MG PO TABS
ORAL_TABLET | ORAL | 3 refills | Status: DC
Start: 2023-06-23 — End: 2023-07-05

## 2023-06-23 MED ORDER — PANTOPRAZOLE SODIUM 40 MG PO TBEC: DELAYED_RELEASE_TABLET | ORAL | 3 refills | Status: DC

## 2023-06-23 MED ORDER — GABAPENTIN 600 MG PO TABS: ORAL_TABLET | ORAL | 3 refills | Status: DC

## 2023-06-29 ENCOUNTER — Telehealth: Payer: Self-pay

## 2023-06-29 NOTE — Telephone Encounter (Signed)
 Copied from CRM [id number]. Topic: Appointments - Scheduling Inquiry for Clinic >> Jun 29, 2023  3:11 PM Bearl Botts E wrote: Reason for CRM: Pt has not been using her CPAP machine because the head gear did not work, she is receiving new supplies in the mail.

## 2023-06-29 NOTE — Telephone Encounter (Signed)
 Pt called to report that she wants her prescriptions transferred to [facility].

## 2023-06-30 ENCOUNTER — Other Ambulatory Visit: Payer: Self-pay | Admitting: Family Medicine

## 2023-06-30 DIAGNOSIS — M79604 Pain in right leg: Secondary | ICD-10-CM

## 2023-06-30 DIAGNOSIS — E1340 Other specified diabetes mellitus with diabetic neuropathy, unspecified: Secondary | ICD-10-CM

## 2023-06-30 NOTE — Telephone Encounter (Signed)
 Copied from CRM [id number]. Topic: Clinical - Medication Refill >> Jun 30, 2023 11:07 AM Georgeann Kindred wrote: Medication:  gabapentin  (NEURONTIN ) 600 MG tablet   potassium chloride  SA (KLOR-CON  M) 20 MEQ tablet  rOPINIRole  (REQUIP ) 1 MG tablet   pantoprazole  (PROTONIX ) 40 MG tablet  Has the patient contacted their pharmacy? Yes (Agent: If no, request that the patient contact the pharmacy for the refill. If patient does not wish to contact the pharmacy document the reason why and proceed with request.) (Agent: If yes, when and what did the pharmacy advise?)  This is the patient's preferred pharmacy:  [facility] - [place], [place] - [address] [place] [address] Phone: [phone] Fax: [phone]  Is this the correct pharmacy for this prescription? Yes If no, delete pharmacy and type the correct one.   Has the prescription been filled recently? No  Is the patient out of the medication? No  Has the patient been seen for an appointment in the last year OR does the patient have an upcoming appointment? Yes  Can we respond through MyChart? No  Agent: Please be advised that Rx refills may take up to 3 business days. We ask that you follow-up with your pharmacy.

## 2023-07-04 NOTE — Telephone Encounter (Signed)
 Duplicate request, refilled 06/23/23 for 90 and 3 RF.  Requested Prescriptions  Pending Prescriptions Disp Refills   gabapentin  (NEURONTIN ) 600 MG tablet 270 tablet 3    Sig: TAKE 1/2 TO 1 TABLET 2 TO 3 TIMES DAILY AS NEEDED FOR NEUROPATHY PAIN     Neurology: Anticonvulsants - gabapentin  Failed - 07/04/2023  8:17 AM      Failed - Valid encounter within last 12 months    Recent Outpatient Visits           1 month ago Physical exam, annual   [facility] Jenelle Mis, FNP              Passed - Cr in normal range and within 360 days    Creat  Date Value Ref Range Status  02/24/2023 0.55 0.50 - 1.05 mg/dL Final   Creatinine, Urine  Date Value Ref Range Status  02/18/2022 58 20 - 275 mg/dL Final         Passed - Completed PHQ-2 or PHQ-9 in the last 360 days       pantoprazole  (PROTONIX ) 40 MG tablet 90 tablet 3    Sig: TAKE 1 TABLET BY MOUTH ONCE DAILY TO  PREVENT  INDIGESTION  AND  HEARTBURN     Gastroenterology: Proton Pump Inhibitors Failed - 07/04/2023  8:17 AM      Failed - Valid encounter within last 12 months    Recent Outpatient Visits           1 month ago Physical exam, annual   [place] [facility] Jenelle Mis, FNP               potassium chloride  SA (KLOR-CON  M) 20 MEQ tablet 270 tablet 3    Sig: Take 1 tablet 3 x /day with Meals for Potassium                                                                /                                           TAKE                                      BY                                       MOUTH     Endocrinology:  Minerals - Potassium Supplementation Failed - 07/04/2023  8:17 AM      Failed - Valid encounter within last 12 months    Recent Outpatient Visits           1 month ago Physical exam, annual   [facility] Jenelle Mis, FNP              Passed - K in normal range and within 360 days    Potassium  Date Value  Ref Range Status  02/24/2023 4.9 3.5 - 5.3 mmol/L Final         Passed - Cr in normal range and within 360 days    Creat  Date Value Ref Range Status  02/24/2023 0.55 0.50 - 1.05 mg/dL Final   Creatinine, Urine  Date Value Ref Range Status  02/18/2022 58 20 - 275 mg/dL Final          rOPINIRole  (REQUIP ) 1 MG tablet 270 tablet 3    Sig: Take  1 tablet 3 x / day as Needed for Restless Legs        /       TAKE      BY      MOUTH     Neurology:  Parkinsonian Agents Failed - 07/04/2023  8:17 AM      Failed - Valid encounter within last 12 months    Recent Outpatient Visits           1 month ago Physical exam, annual   [place] [facility] Jenelle Mis, FNP              Passed - Last BP in normal range    BP Readings from Last 1 Encounters:  05/30/23 138/68         Passed - Last Heart Rate in normal range    Pulse Readings from Last 1 Encounters:  05/30/23 86

## 2023-07-05 ENCOUNTER — Telehealth: Payer: Self-pay

## 2023-07-05 ENCOUNTER — Telehealth: Payer: Self-pay | Admitting: Family Medicine

## 2023-07-05 ENCOUNTER — Other Ambulatory Visit: Payer: Self-pay

## 2023-07-05 DIAGNOSIS — E1340 Other specified diabetes mellitus with diabetic neuropathy, unspecified: Secondary | ICD-10-CM

## 2023-07-05 DIAGNOSIS — I10 Essential (primary) hypertension: Secondary | ICD-10-CM

## 2023-07-05 DIAGNOSIS — M79604 Pain in right leg: Secondary | ICD-10-CM

## 2023-07-05 DIAGNOSIS — M25441 Effusion, right hand: Secondary | ICD-10-CM

## 2023-07-05 DIAGNOSIS — K219 Gastro-esophageal reflux disease without esophagitis: Secondary | ICD-10-CM

## 2023-07-05 MED ORDER — GABAPENTIN 600 MG PO TABS: ORAL_TABLET | ORAL | 3 refills | Status: DC

## 2023-07-05 MED ORDER — ROPINIROLE HCL 1 MG PO TABS: ORAL_TABLET | ORAL | 3 refills | Status: DC

## 2023-07-05 MED ORDER — POTASSIUM CHLORIDE CRYS ER 20 MEQ PO TBCR: EXTENDED_RELEASE_TABLET | ORAL | 3 refills | Status: DC

## 2023-07-05 MED ORDER — PANTOPRAZOLE SODIUM 40 MG PO TBEC: DELAYED_RELEASE_TABLET | ORAL | 3 refills | Status: DC

## 2023-07-05 NOTE — Telephone Encounter (Signed)
 Copied from CRM [id number]. Topic: Clinical - Prescription Issue >> Jul 05, 2023  9:31 AM Kevelyn M wrote: Reason for CRM: Patient stated that Semaglutide  (RYBELSUS ) 3 MG TABS  is dosage is supposed to change but is not sure to how much.  Potassium chloride  SA (KLOR-CON  M) 20 MEQ tablet just needs a refilled and is completely out of this prescription at [facility] [address], [place], [place] [address] Phone: [phone]

## 2023-07-05 NOTE — Telephone Encounter (Signed)
 Copied from CRM [id number]. Topic: Clinical - Prescription Issue >> Jul 05, 2023  8:49 AM [name] wrote: Reason for CRM: AJ from [facility] called to have medication: Potassium Chloride  Crys ER 20 mEq Oral 3 times daily, rOPINIRole  HCl Take one-half to one tablet by mouth three times a day,  Pantoprazole  Sodium 40 mg Oral 2 times daily and Gabapentin  600 mg Oral 3 times daily PRN transferred over from [facility]. Info for [facility] is [facility] - [place], [place] - [address]  Phone: [phone] Fax: [phone]

## 2023-07-08 ENCOUNTER — Other Ambulatory Visit

## 2023-07-08 DIAGNOSIS — I10 Essential (primary) hypertension: Secondary | ICD-10-CM

## 2023-07-09 LAB — COMPREHENSIVE METABOLIC PANEL WITH GFR
AG Ratio: 1.7 (calc) (ref 1.0–2.5)
ALT: 17 U/L (ref 6–29)
AST: 12 U/L (ref 10–35)
Albumin: 4.1 g/dL (ref 3.6–5.1)
Alkaline phosphatase (APISO): 81 U/L (ref 37–153)
BUN/Creatinine Ratio: 31 (calc) — ABNORMAL HIGH (ref 6–22)
BUN: 15 mg/dL (ref 7–25)
CO2: 32 mmol/L (ref 20–32)
Calcium: 9.3 mg/dL (ref 8.6–10.4)
Chloride: 99 mmol/L (ref 98–110)
Creat: 0.48 mg/dL — ABNORMAL LOW (ref 0.50–1.05)
Globulin: 2.4 g/dL (ref 1.9–3.7)
Glucose, Bld: 138 mg/dL — ABNORMAL HIGH (ref 65–99)
Potassium: 4.5 mmol/L (ref 3.5–5.3)
Sodium: 137 mmol/L (ref 135–146)
Total Bilirubin: 0.2 mg/dL (ref 0.2–1.2)
Total Protein: 6.5 g/dL (ref 6.1–8.1)
eGFR: 104 mL/min/{1.73_m2} (ref >=60)

## 2023-07-09 LAB — HEMOGLOBIN A1C
Hgb A1c MFr Bld: 8.6 % — ABNORMAL HIGH (ref <5.7)
Mean Plasma Glucose: 200 mg/dL
eAG (mmol/L): 11.1 mmol/L

## 2023-07-09 LAB — MICROALBUMIN / CREATININE URINE RATIO
Creatinine, Urine: 68 mg/dL (ref 20–275)
Microalb, Ur: <0.2 mg/dL

## 2023-07-09 LAB — CBC WITH DIFFERENTIAL/PLATELET
Absolute Lymphocytes: 1825 {cells}/uL (ref 850–3900)
Absolute Monocytes: 616 {cells}/uL (ref 200–950)
Basophils Absolute: 40 {cells}/uL (ref 0–200)
Basophils Relative: 0.5 %
Eosinophils Absolute: 87 {cells}/uL (ref 15–500)
Eosinophils Relative: 1.1 %
HCT: 38.6 % (ref 35.0–45.0)
Hemoglobin: 12.2 g/dL (ref 11.7–15.5)
MCH: 31 pg (ref 27.0–33.0)
MCHC: 31.6 g/dL — ABNORMAL LOW (ref 32.0–36.0)
MCV: 98.2 fL (ref 80.0–100.0)
MPV: 11.5 fL (ref 7.5–12.5)
Monocytes Relative: 7.8 %
Neutro Abs: 5333 {cells}/uL (ref 1500–7800)
Neutrophils Relative %: 67.5 %
Platelets: 248 10*3/uL (ref 140–400)
RBC: 3.93 10*6/uL (ref 3.80–5.10)
RDW: 12 % (ref 11.0–15.0)
Total Lymphocyte: 23.1 %
WBC: 7.9 10*3/uL (ref 3.8–10.8)

## 2023-07-09 LAB — LIPID PANEL
Cholesterol: 86 mg/dL (ref <200)
HDL: 47 mg/dL — ABNORMAL LOW (ref >=50)
LDL Cholesterol (Calc): 26 mg/dL
Non-HDL Cholesterol (Calc): 39 mg/dL (ref <130)
Total CHOL/HDL Ratio: 1.8 (calc) (ref <5.0)
Triglycerides: 47 mg/dL (ref <150)

## 2023-07-09 LAB — VITAMIN D 25 HYDROXY (VIT D DEFICIENCY, FRACTURES): Vit D, 25-Hydroxy: 83 ng/mL (ref 30–100)

## 2023-07-09 LAB — VITAMIN B12: Vitamin B-12: 265 pg/mL (ref 200–1100)

## 2023-07-09 LAB — SEDIMENTATION RATE: Sed Rate: 2 mm/h (ref 0–30)

## 2023-07-09 LAB — RHEUMATOID FACTOR: Rheumatoid fact SerPl-aCnc: <10 [IU]/mL (ref <14)

## 2023-07-09 LAB — MAGNESIUM: Magnesium: 1.7 mg/dL (ref 1.5–2.5)

## 2023-07-09 LAB — URIC ACID: Uric Acid, Serum: 3.6 mg/dL (ref 2.5–7.0)

## 2023-07-12 ENCOUNTER — Ambulatory Visit: Payer: Self-pay | Admitting: Family Medicine

## 2023-07-12 MED ORDER — RYBELSUS 7 MG PO TABS
7.0000 mg | ORAL_TABLET | Freq: Every day | ORAL | 1 refills | Status: AC
Start: 2023-07-12 — End: ?

## 2023-07-13 NOTE — Progress Notes (Signed)
 Pt. Informed by phone.  Advised of providers treatment plan, and pharmacy that medication was sent to .

## 2023-08-02 ENCOUNTER — Other Ambulatory Visit: Payer: Self-pay

## 2023-08-02 ENCOUNTER — Telehealth: Payer: Self-pay

## 2023-08-02 DIAGNOSIS — E1122 Type 2 diabetes mellitus with diabetic chronic kidney disease: Secondary | ICD-10-CM

## 2023-08-02 DIAGNOSIS — E1169 Type 2 diabetes mellitus with other specified complication: Secondary | ICD-10-CM

## 2023-08-02 MED ORDER — RYBELSUS 3 MG PO TABS: 3.0000 mg | ORAL_TABLET | Freq: Every day | ORAL | 1 refills | Status: DC

## 2023-08-02 NOTE — Telephone Encounter (Signed)
 Copied from CRM [id number]. Topic: Clinical - Prescription Issue >> Aug 02, 2023 12:56 PM Essie A wrote: Reason for CRM: Patient has a prescription for Semaglutide  (RYBELSUS ) 7 MG TABS.  She used to have 3 mg but now has seven.  The 7 mg made her constipated, it messed up her acid reflux and took away her energy.  She took it for over 20 days and decided not to take it as of 07/29/23.  She is back to her old self again.  She wants to know if she should take 1/2 of the pill.  Please advise and call her back at [phone].

## 2023-08-09 ENCOUNTER — Other Ambulatory Visit: Payer: Self-pay | Admitting: Nurse Practitioner

## 2023-08-09 DIAGNOSIS — Z1231 Encounter for screening mammogram for malignant neoplasm of breast: Secondary | ICD-10-CM

## 2023-08-15 ENCOUNTER — Telehealth: Payer: Self-pay

## 2023-08-15 ENCOUNTER — Other Ambulatory Visit

## 2023-08-15 NOTE — Telephone Encounter (Signed)
 Sent in on 6/17.

## 2023-08-15 NOTE — Telephone Encounter (Signed)
 Prescription Request  08/15/2023  LOV: 05/19/23 UPCOMING F/U 08/29/23  What is the name of the medication or equipment? Semaglutide  (RYBELSUS ) 3 MG TABS [[id number]]  Have you contacted your pharmacy to request a refill? Yes   Which pharmacy would you like this sent to?  [facility] - [place], [place] - [address], [place] [address] Phone: [phone]  Fax: [phone] DEA #: --    Patient notified that their request is being sent to the clinical staff for review and that they should receive a response within 2 business days.   Please advise at [facility] [phone]

## 2023-08-16 ENCOUNTER — Ambulatory Visit
Admission: RE | Admit: 2023-08-16 | Discharge: 2023-08-16 | Disposition: A | Discharge status: Home / Self Care | Source: Ambulatory Visit | Attending: Nurse Practitioner | Admitting: Nurse Practitioner

## 2023-08-16 DIAGNOSIS — Z1231 Encounter for screening mammogram for malignant neoplasm of breast: Secondary | ICD-10-CM

## 2023-08-22 ENCOUNTER — Ambulatory Visit: Admitting: Family Medicine

## 2023-08-26 ENCOUNTER — Other Ambulatory Visit

## 2023-08-29 ENCOUNTER — Ambulatory Visit: Admitting: Family Medicine

## 2023-08-29 ENCOUNTER — Encounter: Payer: Self-pay | Admitting: Family Medicine

## 2023-08-29 VITALS — BP 125/78 | HR 83 | Temp 97.9°F | Ht <= 58 in | Wt 129.4 lb

## 2023-08-29 DIAGNOSIS — M25441 Effusion, right hand: Secondary | ICD-10-CM

## 2023-08-29 DIAGNOSIS — Z7984 Long term (current) use of oral hypoglycemic drugs: Secondary | ICD-10-CM

## 2023-08-29 DIAGNOSIS — Z0001 Encounter for general adult medical examination with abnormal findings: Secondary | ICD-10-CM | POA: Diagnosis not present

## 2023-08-29 DIAGNOSIS — Z0000 Encounter for general adult medical examination without abnormal findings: Secondary | ICD-10-CM

## 2023-08-29 DIAGNOSIS — E1122 Type 2 diabetes mellitus with diabetic chronic kidney disease: Secondary | ICD-10-CM

## 2023-08-29 DIAGNOSIS — N181 Chronic kidney disease, stage 1: Secondary | ICD-10-CM | POA: Diagnosis not present

## 2023-08-29 MED ORDER — DULOXETINE HCL 30 MG PO CPEP
30.0000 mg | ORAL_CAPSULE | Freq: Every day | ORAL | 3 refills | Status: DC
Start: 2023-08-29 — End: 2023-11-08

## 2023-08-29 NOTE — Patient Instructions (Signed)
  [name] , Thank you for taking time to come for your Medicare Wellness Visit. I appreciate your ongoing commitment to your health goals. Please review the following plan we discussed and let me know if I can assist you in the future.   These are the goals we discussed:  Goals      HEMOGLOBIN A1C < 7.0     LDL CALC < 70     Set My Weight Loss Goal     Follow Up Date 01/16/2024  // - set weight loss goal    Why is this important?   Losing only 5 to 15 percent of your weight makes a big difference in your health.    Notes: wants to loose 10-15 lbs         This is a list of the screening recommended for you and due dates:  Health Maintenance  Topic Date Due   COVID-19 Vaccine (6 - Mixed Product risk 2024-25 season) 09/14/2023*   Flu Shot  09/16/2023   Hemoglobin A1C  01/08/2024   Eye exam for diabetics  03/07/2024   Complete foot exam   05/18/2024   Yearly kidney function blood test for diabetes  07/07/2024   Yearly kidney health urinalysis for diabetes  07/07/2024   Medicare Annual Wellness Visit  08/28/2024   DTaP/Tdap/Td vaccine (3 - Tdap) 10/10/2024   Colon Cancer Screening  01/27/2025   Mammogram  08/15/2025   Pneumococcal Vaccine for age over 67  Completed   DEXA scan (bone density measurement)  Completed   Hepatitis C Screening  Completed   Zoster (Shingles) Vaccine  Completed   Hepatitis B Vaccine  Aged Out   HPV Vaccine  Aged Out   Meningitis B Vaccine  Aged Out  *Topic was postponed. The date shown is not the original due date.

## 2023-08-29 NOTE — Progress Notes (Signed)
 Subjective:  HPI: [name] is a 68 y.o. female presenting on 08/29/2023 for Annual Exam (Severe joint swelling in hands and shoulders/Constipation/Feels like she is getting acid reflux from Jardiance  )   HPI Patient is in today for chronic condition management. Is not checking her BG at home. Last A1c was 8.6%. She had recently restarted Rybelsus  3mg  daily and is also taking Metformin  1000mg  BID. Denies polyuria, polyphagia, chest pain, paresthesias, vision changes.  Other concerns include right hand pain, no trauma or injury. Ongoing for several months, has tried voltaren  and Meloxicam . Is worse in AM or with activity. Denies warmth, redness, fever. Uric acid and RA labs normal. Has not had imaging. Is using a brace at night.   Review of Systems  All other systems reviewed and are negative.   Relevant past medical history reviewed and updated as indicated.   Past Medical History:  Diagnosis Date   Allergic rhinitis    ASTHMA    BICUSPID AORTIC VALVE    CEREBROVASCULAR DISEASE    DEGENERATIVE JOINT DISEASE    Diabetes mellitus    DJD (degenerative joint disease)    GERD    HSV-1 (herpes simplex virus 1) infection    HYPERLIPIDEMIA    Hypertension    Palpitation    Vitamin D  deficiency      Past Surgical History:  Procedure Laterality Date   ABDOMINAL HYSTERECTOMY     EYE SURGERY     repair of trigger finger      Allergies and medications reviewed and updated.   Current Outpatient Medications:    aspirin  EC 81 MG tablet, Take 81 mg by mouth daily., Disp: , Rfl:    B Complex-C (SUPER B COMPLEX PO), Take 1 tablet by mouth daily. Takes every other day, Disp: , Rfl:    cetirizine (ZYRTEC) 10 MG chewable tablet, Chew 10 mg by mouth every other day., Disp: , Rfl:    Cholecalciferol (VITAMIN D  PO), Take 2 capsules by mouth daily. Take 5000 units by mouth twice a day, Disp: , Rfl:    cyclobenzaprine  (FLEXERIL ) 10 MG tablet, TAKE 1/2 TO 1 TABLET AT BEDTIME FOR  MUSCLE SPASM, Disp: 60 tablet, Rfl: 5   docusate sodium (COLACE) 100 MG capsule, Take 100 mg by mouth daily., Disp: , Rfl:    DULoxetine  (CYMBALTA ) 30 MG capsule, Take 1 capsule (30 mg total) by mouth daily., Disp: 30 capsule, Rfl: 3   empagliflozin  (JARDIANCE ) 25 MG TABS tablet, Take 1 tab prior to breakfast daily for diabetes., Disp: 90 tablet, Rfl: 3   ferrous sulfate 325 (65 FE) MG tablet, Take by mouth., Disp: , Rfl:    gabapentin  (NEURONTIN ) 600 MG tablet, TAKE 1/2 TO 1 TABLET 2 TO 3 TIMES DAILY AS NEEDED FOR NEUROPATHY PAIN, Disp: 270 tablet, Rfl: 3   lisinopril -hydrochlorothiazide  (ZESTORETIC ) 10-12.5 MG tablet, TAKE 1 TABLET DAILY FOR BLOOD PRESSURE AND DIABETIC KIDNEY PROTECTION, Disp: 90 tablet, Rfl: 3   meloxicam  (MOBIC ) 15 MG tablet, TAKE 1/2 TO 1 TABLET DAILY WITH FOOD FOR PAIN AND INFLAMMATION, Disp: 90 tablet, Rfl: 3   METAMUCIL FIBER PO, Take 1 tablet by mouth daily., Disp: , Rfl:    metFORMIN  (GLUCOPHAGE -XR) 500 MG 24 hr tablet, TAKE 2 TABLETS TWICE A DAY WITH MEALS, Disp: 360 tablet, Rfl: 3   metoprolol  succinate (TOPROL -XL) 25 MG 24 hr tablet, TAKE 1 TABLET EVERY DAY FOR BLOOD PRESSURE, Disp: 90 tablet, Rfl: 3   mometasone  (NASONEX ) 50 MCG/ACT nasal spray, Place 2 sprays  into the nose daily. (Patient taking differently: Place 2 sprays into the nose as needed.), Disp: 51 g, Rfl: 3   Multiple Vitamin (MULITIVITAMIN WITH MINERALS) TABS, Take 1 tablet by mouth daily., Disp: , Rfl:    pantoprazole  (PROTONIX ) 40 MG tablet, TAKE 1 TABLET BY MOUTH ONCE DAILY TO  PREVENT  INDIGESTION  AND  HEARTBURN, Disp: 90 tablet, Rfl: 3   potassium chloride  SA (KLOR-CON  M) 20 MEQ tablet, Take 1 tablet 3 x /day with Meals for Potassium                                                                /                                           TAKE                                      BY                                       MOUTH, Disp: 270 tablet, Rfl: 3   rOPINIRole  (REQUIP ) 1 MG tablet, Take  1 tablet 3  x / day as Needed for Restless Legs        /       TAKE      BY      MOUTH, Disp: 270 tablet, Rfl: 3   rosuvastatin  (CRESTOR ) 20 MG tablet, TAKE 1 TABLET EVERY DAY FOR CHOLESTEROL, Disp: 90 tablet, Rfl: 3   Turmeric (QC TUMERIC COMPLEX PO), Take 1 capsule by mouth daily., Disp: , Rfl:    Zinc 30 MG CAPS, Take 1 capsule by mouth daily., Disp: , Rfl:    blood glucose meter kit and supplies KIT, Dispense based on patient and insurance preference. Use up to four times daily as directed. (Patient not taking: Reported on 08/29/2023), Disp: 1 each, Rfl: 0   Semaglutide  (RYBELSUS ) 3 MG TABS, Take 1 tablet (3 mg total) by mouth daily. (Patient not taking: Reported on 08/29/2023), Disp: 90 tablet, Rfl: 1  Allergies  Allergen Reactions   Naprosyn [Naproxen] Other (See Comments)    GI upset    Objective:   BP 125/78   Pulse 83   Temp 97.9 F (36.6 C)   Ht 4' 9 (1.448 m)   Wt 129 lb 6.4 oz (58.7 kg)   SpO2 99%   BMI 28.00 kg/m      08/29/2023    2:31 PM 05/30/2023    8:18 AM 05/19/2023    7:57 AM  Vitals with BMI  Height 4' 9 4' 9 4' 10  Weight 129 lbs 6 oz 127 lbs 120 lbs 10 oz  BMI 27.99 27.48 25.21  Systolic 125 138 877  Diastolic 78 68 68  Pulse 83 86 83     Physical Exam Vitals and nursing note reviewed.  Constitutional:      Appearance: Normal appearance. She is normal weight.  HENT:  Head: Normocephalic and atraumatic.  Cardiovascular:     Rate and Rhythm: Normal rate and regular rhythm.     Pulses: Normal pulses.     Heart sounds: Normal heart sounds.  Pulmonary:     Effort: Pulmonary effort is normal.     Breath sounds: Normal breath sounds.  Musculoskeletal:     Right hand: Swelling present.     Comments: PIP first finger swelling  Skin:    General: Skin is warm and dry.  Neurological:     General: No focal deficit present.     Mental Status: She is alert and oriented to person, place, and time. Mental status is at baseline.  Psychiatric:        Mood and  Affect: Mood normal.        Behavior: Behavior normal.        Thought Content: Thought content normal.        Judgment: Judgment normal.     Assessment & Plan:  Type 2 diabetes mellitus with stage 1 chronic kidney disease, without long-term current use of insulin  (HCC) Assessment & Plan: Recheck A1c in 1 month. Continue Rybelsus  and Metformin . Encouraged to monitor at home.   Orders: -     Hemoglobin A1c; Standing -     DG Hand Complete Right; Future  Swelling of joint of right hand Assessment & Plan: Uric acid, crp, ana, rf negative. Continue Meloxicam , start Duloxetine  30mg  daily. X-ray ordered.    Other orders -     DULoxetine  HCl; Take 1 capsule (30 mg total) by mouth daily.  Dispense: 30 capsule; Refill: 3     Follow up plan: Return in about 7 weeks (around 10/17/2023) for chronic follow-up with labs 1 week prior.  Jeoffrey GORMAN Barrio, FNP

## 2023-08-29 NOTE — Assessment & Plan Note (Signed)
 Uric acid, crp, ana, rf negative. Continue Meloxicam , start Duloxetine  30mg  daily. X-ray ordered.

## 2023-08-29 NOTE — Assessment & Plan Note (Signed)
 Recheck A1c in 1 month. Continue Rybelsus  and Metformin . Encouraged to monitor at home.

## 2023-08-29 NOTE — Progress Notes (Signed)
 Subjective:   [name] is a 68 y.o. female who presents for Medicare Annual (Subsequent) preventive examination.  Visit Complete: In person  Patient Medicare AWV questionnaire was completed by the patient on 08/29/2023; I have confirmed that all information answered by patient is correct and no changes since this date.  Cardiac Risk Factors include: hypertension;diabetes mellitus     Objective:    Today's Vitals   08/29/23 1431 08/29/23 1451 08/29/23 1500  BP: 125/78    Pulse: 83    Temp: 97.9 F (36.6 C)    SpO2: 99%    Weight: 129 lb 6.4 oz (58.7 kg)    Height: 4' 9 (1.448 m)    PainSc: 6  7  7    PainLoc: Shoulder     Body mass index is 28 kg/m.     08/29/2023    3:05 PM 11/18/2021    9:47 AM 11/03/2020   10:00 AM 07/08/2020    7:27 PM 06/27/2020    2:41 PM 11/16/2019    7:47 AM 03/29/2016    4:07 AM  Advanced Directives  Does Patient Have a Medical Advance Directive? No No No No No No No   Would patient like information on creating a medical advance directive? Yes (Inpatient - patient requests chaplain consult to create a medical advance directive) No - Patient declined Yes (MAU/Ambulatory/Procedural Areas - Information given) No - Patient declined  Yes (MAU/Ambulatory/Procedural Areas - Information given) No - Patient declined      Data saved with a previous flowsheet row definition    Current Medications (verified) Outpatient Encounter Medications as of 08/29/2023  Medication Sig   aspirin  EC 81 MG tablet Take 81 mg by mouth daily.   B Complex-C (SUPER B COMPLEX PO) Take 1 tablet by mouth daily. Takes every other day   cetirizine (ZYRTEC) 10 MG chewable tablet Chew 10 mg by mouth every other day.   Cholecalciferol (VITAMIN D  PO) Take 2 capsules by mouth daily. Take 5000 units by mouth twice a day   cyclobenzaprine  (FLEXERIL ) 10 MG tablet TAKE 1/2 TO 1 TABLET AT BEDTIME FOR MUSCLE SPASM   docusate sodium (COLACE) 100 MG capsule Take 100 mg by mouth  daily.   DULoxetine  (CYMBALTA ) 30 MG capsule Take 1 capsule (30 mg total) by mouth daily.   empagliflozin  (JARDIANCE ) 25 MG TABS tablet Take 1 tab prior to breakfast daily for diabetes.   ferrous sulfate 325 (65 FE) MG tablet Take by mouth.   gabapentin  (NEURONTIN ) 600 MG tablet TAKE 1/2 TO 1 TABLET 2 TO 3 TIMES DAILY AS NEEDED FOR NEUROPATHY PAIN   lisinopril -hydrochlorothiazide  (ZESTORETIC ) 10-12.5 MG tablet TAKE 1 TABLET DAILY FOR BLOOD PRESSURE AND DIABETIC KIDNEY PROTECTION   meloxicam  (MOBIC ) 15 MG tablet TAKE 1/2 TO 1 TABLET DAILY WITH FOOD FOR PAIN AND INFLAMMATION   METAMUCIL FIBER PO Take 1 tablet by mouth daily.   metFORMIN  (GLUCOPHAGE -XR) 500 MG 24 hr tablet TAKE 2 TABLETS TWICE A DAY WITH MEALS   metoprolol  succinate (TOPROL -XL) 25 MG 24 hr tablet TAKE 1 TABLET EVERY DAY FOR BLOOD PRESSURE   mometasone  (NASONEX ) 50 MCG/ACT nasal spray Place 2 sprays into the nose daily. (Patient taking differently: Place 2 sprays into the nose as needed.)   Multiple Vitamin (MULITIVITAMIN WITH MINERALS) TABS Take 1 tablet by mouth daily.   pantoprazole  (PROTONIX ) 40 MG tablet TAKE 1 TABLET BY MOUTH ONCE DAILY TO  PREVENT  INDIGESTION  AND  HEARTBURN   potassium chloride  SA (KLOR-CON   M) 20 MEQ tablet Take 1 tablet 3 x /day with Meals for Potassium                                                                /                                           TAKE                                      BY                                       MOUTH   rOPINIRole  (REQUIP ) 1 MG tablet Take  1 tablet 3 x / day as Needed for Restless Legs        /       TAKE      BY      MOUTH   rosuvastatin  (CRESTOR ) 20 MG tablet TAKE 1 TABLET EVERY DAY FOR CHOLESTEROL   Turmeric (QC TUMERIC COMPLEX PO) Take 1 capsule by mouth daily.   Zinc 30 MG CAPS Take 1 capsule by mouth daily.   blood glucose meter kit and supplies KIT Dispense based on patient and insurance preference. Use up to four times daily as directed. (Patient not  taking: Reported on 08/29/2023)   Semaglutide  (RYBELSUS ) 3 MG TABS Take 1 tablet (3 mg total) by mouth daily. (Patient not taking: Reported on 08/29/2023)   No facility-administered encounter medications on file as of 08/29/2023.    Allergies (verified) Naprosyn [naproxen]   History: Past Medical History:  Diagnosis Date   Allergic rhinitis    ASTHMA    BICUSPID AORTIC VALVE    CEREBROVASCULAR DISEASE    DEGENERATIVE JOINT DISEASE    Diabetes mellitus    DJD (degenerative joint disease)    GERD    HSV-1 (herpes simplex virus 1) infection    HYPERLIPIDEMIA    Hypertension    Palpitation    Vitamin D  deficiency    Past Surgical History:  Procedure Laterality Date   ABDOMINAL HYSTERECTOMY     EYE SURGERY     repair of trigger finger     Family History  Problem Relation Age of Onset   Coronary artery disease Mother    Heart disease Mother    Hypertension Mother    Sleep apnea Mother    Diabetes Father    Hypertension Father    Diabetes Son    Breast cancer Maternal Aunt    Social History   Socioeconomic History   Marital status: Married    Spouse name: Not on file   Number of children: Not on file   Years of education: Not on file   Highest education level: Not on file  Occupational History   Not on file  Tobacco Use   Smoking status: Never   Smokeless tobacco: Never  Vaping Use   Vaping status: Never Used  Substance and Sexual Activity  Alcohol use: Not Currently   Drug use: No   Sexual activity: Not Currently  Other Topics Concern   Not on file  Social History Narrative   Pt lives with husband    Pt works    Social Drivers of Corporate investment banker Strain: Not on Ship broker Insecurity: Not on file  Transportation Needs: Not on file  Physical Activity: Not on file  Stress: Not on file  Social Connections: Not on file    Tobacco Counseling Counseling given: Not Answered   Clinical Intake:  Pre-visit preparation completed: Yes  Pain  : 0-10 Pain Score: 7  Pain Type: Neuropathic pain Pain Location: Foot Pain Orientation: Right, Left Pain Radiating Towards: shoulders to hands , feet to legs Pain Descriptors / Indicators: Aching, Burning, Jabbing, Sharp, Shooting Pain Onset: More than a month ago Pain Frequency: Intermittent Pain Relieving Factors: rest, elevation , medication Effect of Pain on Daily Activities: standing for long periods or excessive work.  Pain Relieving Factors: rest, elevation , medication  Nutritional Risks: None Diabetes: Yes CBG done?: No Did pt. bring in CBG monitor from home?: No  How often do you need to have someone help you when you read instructions, pamphlets, or other written materials from your doctor or pharmacy?: 1 - Never What is the last grade level you completed in school?: 12  Interpreter Needed?: No  Information entered by :: Sheena   Activities of Daily Living    08/29/2023    2:58 PM  In your present state of health, do you have any difficulty performing the following activities:  Hearing? 0  Vision? 1  Comment reading  Difficulty concentrating or making decisions? 0  Walking or climbing stairs? 0  Dressing or bathing? 0  Doing errands, shopping? 1  Comment Husband drives her 90 % of the time  Preparing Food and eating ? N  Using the Toilet? N  In the past six months, have you accidently leaked urine? N  Do you have problems with loss of bowel control? N  Managing your Medications? N  Managing your Finances? N  Housekeeping or managing your Housekeeping? N    Patient Care Team: Kayla Jeoffrey RAMAN, FNP as PCP - General (Family Medicine)  Indicate any recent Medical Services you may have received from other than [facility] providers in the past year (date may be approximate).     Assessment:   This is a routine wellness examination for [name].  Hearing/Vision screen No results found.   Goals Addressed             This Visit's Progress    Set My Weight  Loss Goal       Follow Up Date 01/16/2024  // - set weight loss goal    Why is this important?   Losing only 5 to 15 percent of your weight makes a big difference in your health.    Notes: wants to loose 10-15 lbs        Depression Screen    08/29/2023    3:12 PM 05/19/2023    8:00 AM 12/08/2022   11:09 PM 09/04/2022   10:21 PM 05/28/2022    9:44 AM 02/17/2022   11:30 PM 11/18/2021    9:47 AM  PHQ 2/9 Scores  PHQ - 2 Score 0 0 0 0 0 0 0  PHQ- 9 Score 0          Fall Risk    08/29/2023    3:12  PM 08/29/2023    3:02 PM 05/19/2023    8:00 AM 09/04/2022   10:21 PM 05/28/2022    9:44 AM  Fall Risk   Falls in the past year? 0 0 0 0 0  Number falls in past yr: 0 0 0    Injury with Fall? 0 0 0    Risk for fall due to : No Fall Risks No Fall Risks No Fall Risks    Follow up Falls evaluation completed Falls evaluation completed Falls prevention discussed;Falls evaluation completed      MEDICARE RISK AT HOME: Medicare Risk at Home Any stairs in or around the home?: No If so, are there any without handrails?: No Home free of loose throw rugs in walkways, pet beds, electrical cords, etc?: Yes Adequate lighting in your home to reduce risk of falls?: Yes Life alert?: No Use of a cane, walker or w/c?: No Grab bars in the bathroom?: Yes Shower chair or bench in shower?: Yes Elevated toilet seat or a handicapped toilet?: Yes  TIMED UP AND GO:  Was the test performed?  Yes  Length of time to ambulate 10 feet: 10 sec Gait steady and fast without use of assistive device    Cognitive Function:        08/29/2023    3:03 PM  6CIT Screen  What Year? 0 points  What month? 0 points  What time? 0 points  Count back from 20 0 points  Months in reverse 0 points  Repeat phrase 0 points  Total Score 0 points    Immunizations Immunization History  Administered Date(s) Administered   DT (Pediatric) 10/11/2014   DTaP 03/18/2004   Influenza Inj Mdck Quad With Preservative 11/17/2016    Influenza Split 11/15/2013, 11/30/2014   Influenza Whole 11/02/2012   Influenza, High Dose Seasonal PF 11/18/2021, 11/17/2022   Influenza,inj,Quad PF,6+ Mos 10/21/2017, 09/30/2018   Influenza-Unspecified 10/31/2015, 10/21/2017, 11/27/2019   Moderna Covid-19 Fall Seasonal Vaccine 64yrs & older 12/02/2021, 11/17/2022   Moderna SARS-COV2 Booster Vaccination 05/26/2020   Moderna Sars-Covid-2 Vaccination 04/15/2019, 05/19/2019   PFIZER(Purple Top)SARS-COV-2 Vaccination 01/01/2020   PNEUMOCOCCAL CONJUGATE-20 11/18/2021   PPD Test 07/30/2013, 10/11/2014, 11/06/2015, 11/17/2016, 12/12/2017, 12/27/2018, 01/08/2020   Pneumococcal Conjugate-13 11/15/2013, 11/17/2016   Pneumococcal Polysaccharide-23 05/16/1996   Respiratory Syncytial Virus Vaccine,Recomb Aduvanted(Arexvy) 12/02/2021   Zoster Recombinant(Shingrix) 01/02/2018, 04/05/2018, 04/19/2018    TDAP status: Up to date  Flu Vaccine status: Up to date  Pneumococcal vaccine status: Due, Education has been provided regarding the importance of this vaccine. Advised may receive this vaccine at local pharmacy or Health Dept. Aware to provide a copy of the vaccination record if obtained from local pharmacy or Health Dept. Verbalized acceptance and understanding.  Covid-19 vaccine status: Completed vaccines  Qualifies for Shingles Vaccine? No   Zostavax completed Yes   Shingrix Completed?: Yes  Screening Tests Health Maintenance  Topic Date Due   COVID-19 Vaccine (6 - Mixed Product risk 2024-25 season) 09/14/2023 (Originally 05/18/2023)   INFLUENZA VACCINE  09/16/2023   HEMOGLOBIN A1C  01/08/2024   OPHTHALMOLOGY EXAM  03/07/2024   FOOT EXAM  05/18/2024   Diabetic kidney evaluation - eGFR measurement  07/07/2024   Diabetic kidney evaluation - Urine ACR  07/07/2024   Medicare Annual Wellness (AWV)  08/28/2024   DTaP/Tdap/Td (3 - Tdap) 10/10/2024   Colonoscopy  01/27/2025   MAMMOGRAM  08/15/2025   Pneumococcal Vaccine: 50+ Years  Completed    DEXA SCAN  Completed   Hepatitis C Screening  Completed   Zoster Vaccines- Shingrix  Completed   Hepatitis B Vaccines  Aged Out   HPV VACCINES  Aged Out   Meningococcal B Vaccine  Aged Out    Health Maintenance  There are no preventive care reminders to display for this patient.   Colorectal cancer screening: Type of screening: Colonoscopy. Completed 01/28/2015. Repeat every 10 years  Mammogram status: Completed 08/16/2023. Repeat every year  Bone Density status: Completed 05/07/2022. Results reflect: Bone density results: NORMAL. Repeat every 2 years.  Lung Cancer Screening: (Low Dose CT Chest recommended if Age 62-80 years, 20 pack-year currently smoking OR have quit w/in 15years.) does not qualify.   Lung Cancer Screening Referral: n/a  Additional Screening:  Hepatitis C Screening: does not qualify; Completed 06/12/215  Vision Screening: Recommended annual ophthalmology exams for early detection of glaucoma and other disorders of the eye. Is the patient up to date with their annual eye exam?  No  Who is the provider or what is the name of the office in which the patient attends annual eye exams? 03/08/2023 If pt is not established with a provider, would they like to be referred to a provider to establish care? No .   Dental Screening: Recommended annual dental exams for proper oral hygiene  Diabetic Foot Exam: Diabetic Foot Exam: Completed 05/19/2023  Community Resource Referral / Chronic Care Management: CRR required this visit?  No   CCM required this visit?  No     Plan:     I have personally reviewed and noted the following in the patient's chart:   Medical and social history Use of alcohol, tobacco or illicit drugs  Current medications and supplements including opioid prescriptions. Patient is not currently taking opioid prescriptions. Functional ability and status Nutritional status Physical activity Advanced directives List of other  physicians Hospitalizations, surgeries, and ER visits in previous 12 months Vitals Screenings to include cognitive, depression, and falls Referrals and appointments  In addition, I have reviewed and discussed with patient certain preventive protocols, quality metrics, and best practice recommendations. A written personalized care plan for preventive services as well as general preventive health recommendations were provided to patient.     Jeoffrey GORMAN Barrio, FNP   08/29/2023   After Visit Summary: (In Person-Printed) AVS printed and given to the patient  Nurse Notes:

## 2023-08-30 ENCOUNTER — Telehealth: Payer: Self-pay | Admitting: Family Medicine

## 2023-08-30 ENCOUNTER — Other Ambulatory Visit: Payer: Self-pay

## 2023-08-30 ENCOUNTER — Other Ambulatory Visit: Payer: Self-pay | Admitting: Family Medicine

## 2023-08-30 DIAGNOSIS — E1122 Type 2 diabetes mellitus with diabetic chronic kidney disease: Secondary | ICD-10-CM

## 2023-08-30 DIAGNOSIS — E1169 Type 2 diabetes mellitus with other specified complication: Secondary | ICD-10-CM

## 2023-08-30 DIAGNOSIS — E119 Type 2 diabetes mellitus without complications: Secondary | ICD-10-CM

## 2023-08-30 MED ORDER — METFORMIN HCL ER 500 MG PO TB24: ORAL_TABLET | ORAL | 3 refills | Status: DC

## 2023-08-30 NOTE — Telephone Encounter (Signed)
 Prescription Request  08/30/2023  LOV: 05/19/2023  What is the name of the medication or equipment?   metFORMIN  (GLUCOPHAGE -XR) 500 MG 24 hr tablet   Have you contacted your pharmacy to request a refill? Yes   Which pharmacy would you like this sent to?  [email or web address] - [place], [place] - [address] [place] [address] Phone: [phone] Fax: [phone]    Patient notified that their request is being sent to the clinical staff for review and that they should receive a response within 2 business days.   Please advise pharmacist.

## 2023-09-02 ENCOUNTER — Ambulatory Visit (HOSPITAL_COMMUNITY)
Admission: RE | Admit: 2023-09-02 | Discharge: 2023-09-02 | Disposition: A | Discharge status: Home / Self Care | Source: Ambulatory Visit | Attending: Family Medicine | Admitting: Family Medicine

## 2023-09-02 DIAGNOSIS — E1122 Type 2 diabetes mellitus with diabetic chronic kidney disease: Secondary | ICD-10-CM | POA: Diagnosis present

## 2023-09-02 DIAGNOSIS — N181 Chronic kidney disease, stage 1: Secondary | ICD-10-CM | POA: Insufficient documentation

## 2023-09-05 ENCOUNTER — Other Ambulatory Visit: Payer: Self-pay | Admitting: Family Medicine

## 2023-09-05 DIAGNOSIS — I10 Essential (primary) hypertension: Secondary | ICD-10-CM

## 2023-09-05 DIAGNOSIS — E1169 Type 2 diabetes mellitus with other specified complication: Secondary | ICD-10-CM

## 2023-09-05 NOTE — Telephone Encounter (Unsigned)
 Copied from CRM [id number]. Topic: Clinical - Medication Refill >> Sep 05, 2023 11:43 AM Marissa P wrote: Medication: metoprolol  succinate (TOPROL -XL) 25 MG 24 hr tablet and  rosuvastatin  (CRESTOR ) 20 MG tablet  Has the patient contacted their pharmacy? Yes (Agent: If no, request that the patient contact the pharmacy for the refill. If patient does not wish to contact the pharmacy document the reason why and proceed with request.) (Agent: If yes, when and what did the pharmacy advise?)  This is the patient's preferred pharmacy:  [email or web address] - [place], [place] - [address] [place] [address] Phone: [phone] Fax: [phone]  Is this the correct pharmacy for this prescription? Yes If no, delete pharmacy and type the correct one.   Has the prescription been filled recently? Yes  Is the patient out of the medication? No  Has the patient been seen for an appointment in the last year OR does the patient have an upcoming appointment? Yes  Can we respond through MyChart? No  Agent: Please be advised that Rx refills may take up to 3 business days. We ask that you follow-up with your pharmacy.

## 2023-09-07 MED ORDER — METOPROLOL SUCCINATE ER 25 MG PO TB24: ORAL_TABLET | ORAL | 3 refills | Status: DC

## 2023-09-07 MED ORDER — ROSUVASTATIN CALCIUM 20 MG PO TABS: ORAL_TABLET | ORAL | 3 refills | Status: DC

## 2023-09-07 NOTE — Telephone Encounter (Signed)
 Requested medication (s) are due for refill today: yes  Requested medication (s) are on the active medication list: yes  Last refill:  metoprolol : 09/16/22 #90 3 RF     rosuvastatin : 09/16/22 #90 3 RF  Future visit scheduled: yes  Notes to clinic:  last prescriber not with practice (practice has closed down)   Requested Prescriptions  Pending Prescriptions Disp Refills   metoprolol  succinate (TOPROL -XL) 25 MG 24 hr tablet 90 tablet 3    Sig: TAKE 1 TABLET EVERY DAY FOR BLOOD PRESSURE     Cardiovascular:  Beta Blockers Passed - 09/07/2023  1:23 PM      Passed - Last BP in normal range    BP Readings from Last 1 Encounters:  08/29/23 125/78         Passed - Last Heart Rate in normal range    Pulse Readings from Last 1 Encounters:  08/29/23 83         Passed - Valid encounter within last 6 months    Recent Outpatient Visits           3 months ago Physical exam, annual   [facility] Kayla Jeoffrey RAMAN, FNP               rosuvastatin  (CRESTOR ) 20 MG tablet 90 tablet 3    Sig: TAKE 1 TABLET EVERY DAY FOR CHOLESTEROL     Cardiovascular:  Antilipid - Statins 2 Failed - 09/07/2023  1:23 PM      Failed - Cr in normal range and within 360 days    Creat  Date Value Ref Range Status  07/08/2023 0.48 (L) 0.50 - 1.05 mg/dL Final   Creatinine, Urine  Date Value Ref Range Status  07/08/2023 68 20 - 275 mg/dL Final         Failed - Lipid Panel in normal range within the last 12 months    Cholesterol  Date Value Ref Range Status  07/08/2023 86 <200 mg/dL Final   LDL Cholesterol (Calc)  Date Value Ref Range Status  07/08/2023 26 mg/dL (calc) Final    Comment:    Reference range: <100 . Desirable range <100 mg/dL for primary prevention;   <70 mg/dL for patients with CHD or diabetic patients  with > or = 2 CHD risk factors. SABRA LDL-C is now calculated using the Martin-Hopkins  calculation, which is a validated novel method providing  better  accuracy than the Friedewald equation in the  estimation of LDL-C.  Gladis APPLETHWAITE et al. SANDREA. 7986;689(80): 2061-2068  (http://education.[employer].com/faq/FAQ164)    HDL  Date Value Ref Range Status  07/08/2023 47 (L) > OR = 50 mg/dL Final   Triglycerides  Date Value Ref Range Status  07/08/2023 47 <150 mg/dL Final         Passed - Patient is not pregnant      Passed - Valid encounter within last 12 months    Recent Outpatient Visits           3 months ago Physical exam, annual   [facility] Kayla Jeoffrey RAMAN, FNP

## 2023-09-09 ENCOUNTER — Other Ambulatory Visit: Payer: Self-pay | Admitting: Nurse Practitioner

## 2023-09-09 DIAGNOSIS — M5432 Sciatica, left side: Secondary | ICD-10-CM

## 2023-09-13 ENCOUNTER — Ambulatory Visit: Payer: Self-pay | Admitting: Family Medicine

## 2023-09-13 NOTE — Progress Notes (Signed)
 Seeing PCP tomorrow for appt.

## 2023-09-13 NOTE — Progress Notes (Signed)
 Need to see patient to evaluate her finger, there were some concerning findings on the recent x-ray however I have not seen her for this since April. I tried calling but no answer. Thank you

## 2023-09-14 ENCOUNTER — Ambulatory Visit (INDEPENDENT_AMBULATORY_CARE_PROVIDER_SITE_OTHER): Admitting: Family Medicine

## 2023-09-14 ENCOUNTER — Encounter: Payer: Self-pay | Admitting: Family Medicine

## 2023-09-14 VITALS — BP 124/68 | HR 86 | Temp 97.7°F | Ht <= 58 in | Wt 125.8 lb

## 2023-09-14 DIAGNOSIS — E1122 Type 2 diabetes mellitus with diabetic chronic kidney disease: Secondary | ICD-10-CM

## 2023-09-14 DIAGNOSIS — M25441 Effusion, right hand: Secondary | ICD-10-CM | POA: Diagnosis not present

## 2023-09-14 DIAGNOSIS — N181 Chronic kidney disease, stage 1: Secondary | ICD-10-CM

## 2023-09-14 NOTE — Assessment & Plan Note (Signed)
 Consistent with arthritis and presence of moderate degenerative changes on x-ray. Continue Voltaren  PRN and compression. Return to office if symptoms persist or worsen.

## 2023-09-14 NOTE — Progress Notes (Signed)
 Subjective:  HPI: [name] is a 68 y.o. female presenting on 09/14/2023 for Medical Management of Chronic Issues (Follow up after hand xray)   HPI Patient is in today for follow up for right second finger swelling at the MCP joint. She does have a PMH of traumatic injury to the distal phalange of that finger many years ago, this is not red, warm, or swollen above baseline. Her concerns are regarding MCP joint pain and swelling. 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 Workup negative for RA, gout, infection however x-ray did show some moderate degenerative changes. Symptoms are improved with Duloxetine  and PRN voltaren  as well as compression.  X-ray findings 09/02/2023: There is soft tissue swelling of the distal second finger. There is fragmentation and chronic appearing fracture or erosion involving the mid aspect of the second distal phalanx. Joint spaces are maintained. There are mild degenerative changes of the interphalangeal joints diffusely. There are moderate degenerative changes at the first carpometacarpal joint. There are 2 rounded radiopaque densities measuring 3 mm within the soft tissues of the anterior wrist at the level of the distal radius, indeterminate.   IMPRESSION: 1. Soft tissue swelling of the distal second finger. 2. Fragmentation and chronic appearing fracture or erosion involving the mid aspect of the second distal phalanx. Correlate clinically for trauma and/or infection. 3. There are 2 rounded radiopaque densities measuring 3 mm within the soft tissues of the anterior wrist at the level of the distal radius, indeterminate. Correlate clinically.  Review of Systems  All other systems reviewed and are negative.   Relevant past medical history reviewed and updated as indicated.   Past Medical History:  Diagnosis Date   Allergic rhinitis    ASTHMA    BICUSPID AORTIC VALVE    CEREBROVASCULAR DISEASE    DEGENERATIVE JOINT DISEASE     Diabetes mellitus    DJD (degenerative joint disease)    GERD    HSV-1 (herpes simplex virus 1) infection    HYPERLIPIDEMIA    Hypertension    Palpitation    Vitamin D  deficiency      Past Surgical History:  Procedure Laterality Date   ABDOMINAL HYSTERECTOMY     EYE SURGERY     repair of trigger finger      Allergies and medications reviewed and updated.   Current Outpatient Medications:    aspirin  EC 81 MG tablet, Take 81 mg by mouth daily., Disp: , Rfl:    B Complex-C (SUPER B COMPLEX PO), Take 1 tablet by mouth daily. Takes every other day, Disp: , Rfl:    cetirizine (ZYRTEC) 10 MG chewable tablet, Chew 10 mg by mouth every other day., Disp: , Rfl:    Cholecalciferol (VITAMIN D  PO), Take 2 capsules by mouth daily. Take 5000 units by mouth twice a day, Disp: , Rfl:    cyclobenzaprine  (FLEXERIL ) 10 MG tablet, TAKE 1/2 TO 1 TABLET AT BEDTIME FOR MUSCLE SPASM, Disp: 60 tablet, Rfl: 5   DULoxetine  (CYMBALTA ) 30 MG capsule, Take 1 capsule (30 mg total) by mouth daily., Disp: 30 capsule, Rfl: 3   ferrous sulfate 325 (65 FE) MG tablet, Take by mouth., Disp: , Rfl:    gabapentin  (NEURONTIN ) 600 MG tablet, TAKE 1/2 TO 1 TABLET 2 TO 3 TIMES DAILY AS NEEDED FOR NEUROPATHY PAIN, Disp: 270 tablet, Rfl: 3   lisinopril -hydrochlorothiazide  (ZESTORETIC ) 10-12.5 MG tablet, TAKE 1 TABLET DAILY FOR BLOOD PRESSURE AND DIABETIC KIDNEY PROTECTION, Disp: 90 tablet, Rfl: 3  meloxicam  (MOBIC ) 15 MG tablet, TAKE 1/2 TO 1 TABLET DAILY WITH FOOD FOR PAIN AND INFLAMMATION, Disp: 90 tablet, Rfl: 3   METAMUCIL FIBER PO, Take 1 tablet by mouth daily., Disp: , Rfl:    metFORMIN  (GLUCOPHAGE -XR) 500 MG 24 hr tablet, TAKE 2 TABLETS TWICE A DAY WITH MEALS, Disp: 360 tablet, Rfl: 3   metoprolol  succinate (TOPROL -XL) 25 MG 24 hr tablet, TAKE 1 TABLET EVERY DAY FOR BLOOD PRESSURE, Disp: 90 tablet, Rfl: 3   mometasone  (NASONEX ) 50 MCG/ACT nasal spray, Place 2 sprays into the nose daily. (Patient taking differently: Place  2 sprays into the nose as needed.), Disp: 51 g, Rfl: 3   Multiple Vitamin (MULITIVITAMIN WITH MINERALS) TABS, Take 1 tablet by mouth daily., Disp: , Rfl:    pantoprazole  (PROTONIX ) 40 MG tablet, TAKE 1 TABLET BY MOUTH ONCE DAILY TO  PREVENT  INDIGESTION  AND  HEARTBURN, Disp: 90 tablet, Rfl: 3   potassium chloride  SA (KLOR-CON  M) 20 MEQ tablet, Take 1 tablet 3 x /day with Meals for Potassium                                                                /                                           TAKE                                      BY                                       MOUTH, Disp: 270 tablet, Rfl: 3   rOPINIRole  (REQUIP ) 1 MG tablet, Take  1 tablet 3 x / day as Needed for Restless Legs        /       TAKE      BY      MOUTH, Disp: 270 tablet, Rfl: 3   rosuvastatin  (CRESTOR ) 20 MG tablet, TAKE 1 TABLET EVERY DAY FOR CHOLESTEROL, Disp: 90 tablet, Rfl: 3   RYBELSUS  3 MG TABS, Take 1 tablet by mouth every day, Disp: 90 tablet, Rfl: 11   Turmeric (QC TUMERIC COMPLEX PO), Take 1 capsule by mouth daily., Disp: , Rfl:    Zinc 30 MG CAPS, Take 1 capsule by mouth daily., Disp: , Rfl:    blood glucose meter kit and supplies KIT, Dispense based on patient and insurance preference. Use up to four times daily as directed. (Patient not taking: Reported on 09/14/2023), Disp: 1 each, Rfl: 0   docusate sodium (COLACE) 100 MG capsule, Take 100 mg by mouth daily. (Patient not taking: Reported on 09/14/2023), Disp: , Rfl:    empagliflozin  (JARDIANCE ) 25 MG TABS tablet, Take 1 tab prior to breakfast daily for diabetes. (Patient not taking: Reported on 09/14/2023), Disp: 90 tablet, Rfl: 3  Allergies  Allergen Reactions   Naprosyn [Naproxen] Other (See Comments)    GI upset    Objective:  BP 124/68   Pulse 86   Temp 97.7 F (36.5 C)   Ht 4' 9 (1.448 m)   Wt 125 lb 12.8 oz (57.1 kg)   SpO2 97%   BMI 27.22 kg/m      09/14/2023   10:09 AM 08/29/2023    2:31 PM 05/30/2023    8:18 AM  Vitals with BMI   Height 4' 9 4' 9 4' 9  Weight 125 lbs 13 oz 129 lbs 6 oz 127 lbs  BMI 27.22 27.99 27.48  Systolic 124 125 861  Diastolic 68 78 68  Pulse 86 83 86     Physical Exam Vitals and nursing note reviewed.  Constitutional:      Appearance: Normal appearance. She is normal weight.  HENT:     Head: Normocephalic and atraumatic.  Musculoskeletal:     Right hand: Swelling, deformity (distal phalange of 2nd finger) and tenderness present.     Left hand: Normal.       Arms:  Skin:    General: Skin is warm and dry.  Neurological:     General: No focal deficit present.     Mental Status: She is alert and oriented to person, place, and time. Mental status is at baseline.  Psychiatric:        Mood and Affect: Mood normal.        Behavior: Behavior normal.        Thought Content: Thought content normal.        Judgment: Judgment normal.     Assessment & Plan:  Swelling of joint of right hand Assessment & Plan: Consistent with arthritis and presence of moderate degenerative changes on x-ray. Continue Voltaren  PRN and compression. Return to office if symptoms persist or worsen.    Type 2 diabetes mellitus with stage 1 chronic kidney disease, without long-term current use of insulin  (HCC) Assessment & Plan: Last A1c 8.6%. Started on Rybelsus  3mg  daily just prior, unable to tolerate dose increase. Recommend heart healthy diet such as Mediterranean diet with whole grains, fruits, vegetable, fish, lean meats, nuts, and olive oil. Limit salt. Encouraged moderate walking, 3-5 times/week for 30-50 minutes each session. Aim for at least 150 minutes.week. Goal should be pace of 3 miles/hours, or walking 1.5 miles in 30 minutes. Seek medical care for urinary frequency, extreme thirst, vision changes, lightheadedness, dizziness.  Follow up in 4 weeks for A1c check      Follow up plan: Return in about 5 weeks (around 10/20/2023) for chronic follow-up with labs 1 week prior.  Jeoffrey GORMAN Barrio,  FNP

## 2023-09-14 NOTE — Assessment & Plan Note (Signed)
 Last A1c 8.6%. Started on Rybelsus  3mg  daily just prior, unable to tolerate dose increase. Recommend heart healthy diet such as Mediterranean diet with whole grains, fruits, vegetable, fish, lean meats, nuts, and olive oil. Limit salt. Encouraged moderate walking, 3-5 times/week for 30-50 minutes each session. Aim for at least 150 minutes.week. Goal should be pace of 3 miles/hours, or walking 1.5 miles in 30 minutes. Seek medical care for urinary frequency, extreme thirst, vision changes, lightheadedness, dizziness.  Follow up in 4 weeks for A1c check

## 2023-09-19 ENCOUNTER — Other Ambulatory Visit: Payer: Self-pay | Admitting: Nurse Practitioner

## 2023-09-19 DIAGNOSIS — M545 Low back pain, unspecified: Secondary | ICD-10-CM

## 2023-09-21 ENCOUNTER — Other Ambulatory Visit: Payer: Self-pay | Admitting: Family Medicine

## 2023-09-29 ENCOUNTER — Telehealth: Payer: Self-pay | Admitting: Family Medicine

## 2023-09-29 NOTE — Telephone Encounter (Signed)
 Prescription Request  09/29/2023  LOV: 09/14/2023  What is the name of the medication or equipment?   cyclobenzaprine  (FLEXERIL ) 10 MG tablet   Have you contacted your pharmacy to request a refill? Yes   Which pharmacy would you like this sent to?  [facility] - [place], [place] - [address] [place] [address] Phone: [phone] Fax: [phone]    Patient notified that their request is being sent to the clinical staff for review and that they should receive a response within 2 business days.   Please advise pharmacist.

## 2023-10-03 ENCOUNTER — Other Ambulatory Visit: Payer: Self-pay | Admitting: Family Medicine

## 2023-10-03 DIAGNOSIS — M545 Low back pain, unspecified: Secondary | ICD-10-CM

## 2023-10-03 NOTE — Telephone Encounter (Signed)
 Copied from CRM #[id number]. Topic: Clinical - Medication Refill >> Oct 03, 2023 12:34 PM Delon T wrote: Medication: cyclobenzaprine  (FLEXERIL ) 10 MG tablet  Has the patient contacted their pharmacy? Yes (Agent: If no, request that the patient contact the pharmacy for the refill. If patient does not wish to contact the pharmacy document the reason why and proceed with request.) (Agent: If yes, when and what did the pharmacy advise?)  This is the patient's preferred pharmacy:  [facility] - [place], [place] - [address] [place] [address] Phone: [phone] Fax: [phone]  Is this the correct pharmacy for this prescription? Yes If no, delete pharmacy and type the correct one.   Has the prescription been filled recently? Yes  Is the patient out of the medication? Yes  Has the patient been seen for an appointment in the last year OR does the patient have an upcoming appointment? Yes  Can we respond through MyChart? Yes  Agent: Please be advised that Rx refills may take up to 3 business days. We ask that you follow-up with your pharmacy.

## 2023-10-04 ENCOUNTER — Other Ambulatory Visit: Payer: Self-pay | Admitting: Family Medicine

## 2023-10-05 NOTE — Telephone Encounter (Signed)
 Requested medication (s) are due for refill today: yes  Requested medication (s) are on the active medication list: yes  Last refill:  10/19/22 #60 5 RF  Future visit scheduled: yes  Notes to clinic:  last prescribed by Lonell Liverpool NP at [facility]   Requested Prescriptions  Pending Prescriptions Disp Refills   cyclobenzaprine  (FLEXERIL ) 10 MG tablet 60 tablet 5    Sig: Take 1/2 to 1 tablet at Bedtime for Muscle Spasm     Not Delegated - Analgesics:  Muscle Relaxants Failed - 10/05/2023 11:50 AM      Failed - This refill cannot be delegated      Passed - Valid encounter within last 6 months    Recent Outpatient Visits           3 weeks ago Swelling of joint of right hand   [place] [facility] Kayla Jeoffrey RAMAN, FNP   4 months ago Physical exam, annual   [place] [facility] Kayla Jeoffrey RAMAN, [place]

## 2023-10-07 ENCOUNTER — Ambulatory Visit (INDEPENDENT_AMBULATORY_CARE_PROVIDER_SITE_OTHER)

## 2023-10-07 ENCOUNTER — Encounter (HOSPITAL_COMMUNITY): Payer: Self-pay

## 2023-10-07 ENCOUNTER — Ambulatory Visit (HOSPITAL_COMMUNITY)
Admission: EM | Admit: 2023-10-07 | Discharge: 2023-10-07 | Disposition: A | Discharge status: Home / Self Care | Attending: Physician Assistant | Admitting: Physician Assistant

## 2023-10-07 DIAGNOSIS — R051 Acute cough: Secondary | ICD-10-CM

## 2023-10-07 DIAGNOSIS — J209 Acute bronchitis, unspecified: Secondary | ICD-10-CM

## 2023-10-07 DIAGNOSIS — R059 Cough, unspecified: Secondary | ICD-10-CM | POA: Diagnosis not present

## 2023-10-07 MED ORDER — BENZONATATE 100 MG PO CAPS: 100.0000 mg | ORAL_CAPSULE | Freq: Three times a day (TID) | ORAL | 0 refills | Status: DC

## 2023-10-07 MED ORDER — AZITHROMYCIN 250 MG PO TABS
250.0000 mg | ORAL_TABLET | Freq: Every day | ORAL | 0 refills | Status: AC
Start: 2023-10-07 — End: 2023-10-12

## 2023-10-07 NOTE — Discharge Instructions (Addendum)
 Chest xray is normal.  Take antibiotic as prescribed. Take Tessalon  as needed for cough. Continue with Mucinex. Drink plenty of fluids. For constipation recommend increasing fiber intake or take a fiber supplement and follow-up with primary care physician if this continues.

## 2023-10-07 NOTE — ED Triage Notes (Signed)
 Patient reports that she has had a non productive cough x 2 weeks.  Patient also c/o having thin flaky stools and lots of gas x 6 months and worsening.  Patient states she has been taking Nyquil and Mucinex.

## 2023-10-07 NOTE — ED Provider Notes (Signed)
 [facility]    CSN: [id number] Arrival date & time: 10/07/23  1542      History   Chief Complaint Chief Complaint  Patient presents with   Cough   stool issue    HPI [name] is a 68 y.o. female.   Patient complains of 2 weeks of a nonproductive cough.  She reports cough is worse at night and keeps her up.  She denies fever, chills, nausea, vomiting.  She has been taking Mucinex and NyQuil with some relief.  Denies wheezing.  Does have a history of asthma.  She also complains of bowel problems, reports very thin bowel movements.  Denies abdominal pain.    Past Medical History:  Diagnosis Date   Allergic rhinitis    ASTHMA    BICUSPID AORTIC VALVE    CEREBROVASCULAR DISEASE    DEGENERATIVE JOINT DISEASE    Diabetes mellitus    DJD (degenerative joint disease)    GERD    HSV-1 (herpes simplex virus 1) infection    HYPERLIPIDEMIA    Hypertension    Palpitation    Vitamin D  deficiency     Patient Active Problem List   Diagnosis Date Noted   Physical exam, annual 05/19/2023   Overweight (BMI 25.0-29.9) 05/19/2023   Swelling of joint of right hand 05/19/2023   Restless legs 08/04/2021   Hypertensive disorder 08/04/2021   Degeneration of lumbar or lumbosacral intervertebral disc 10/10/2019   Type 2 diabetes with stage 1 chronic kidney disease GFR>90 (HCC) 04/22/2015   Esotropia of right eye 04/22/2015   Sleep disorder, shift-work 10/13/2014   Left carotid bruit 02/14/2014   Essential hypertension 07/27/2013   Hyperlipidemia associated with type 2 diabetes mellitus (HCC) 02/01/2013   Vitamin D  deficiency 02/01/2013   BICUSPID AORTIC VALVE 07/03/2008   Cerebrovascular disease 07/02/2008   Asthma 07/02/2008   GERD 07/02/2008   Osteoarthritis 07/02/2008    Past Surgical History:  Procedure Laterality Date   ABDOMINAL HYSTERECTOMY     EYE SURGERY     repair of trigger finger      OB History   No obstetric history on file.       Home Medications    Prior to Admission medications   Medication Sig Start Date End Date Taking? Authorizing Provider  azithromycin  (ZITHROMAX  Z-PAK) 250 MG tablet Take 1 tablet (250 mg total) by mouth daily for 5 days. Take two tablets by mouth on the first day and then one tablet by mouth on remaining days 10/07/23 10/12/23 Yes Ward, Harlene PEDLAR, PA-C  benzonatate  (TESSALON ) 100 MG capsule Take 1 capsule (100 mg total) by mouth every 8 (eight) hours. 10/07/23  Yes Ward, Harlene PEDLAR, PA-C  aspirin  EC 81 MG tablet Take 81 mg by mouth daily.    [provider]  B Complex-C (SUPER B COMPLEX PO) Take 1 tablet by mouth daily. Takes every other day    [provider]  blood glucose meter kit and supplies KIT Dispense based on patient and insurance preference. Use up to four times daily as directed. Patient not taking: Reported on 09/14/2023 02/24/23   Cranford, Tonya, NP  cetirizine (ZYRTEC) 10 MG chewable tablet Chew 10 mg by mouth every other day.    [provider]  Cholecalciferol (VITAMIN D  PO) Take 2 capsules by mouth daily. Take 5000 units by mouth twice a day    [provider]  cyclobenzaprine  (FLEXERIL ) 10 MG tablet TAKE 1/2 TO 1 TABLET AT BEDTIME FOR MUSCLE  SPASM 10/19/22   Wilkinson, Dana E, NP  docusate sodium (COLACE) 100 MG capsule Take 100 mg by mouth daily. Patient not taking: Reported on 09/14/2023    [provider]  DULoxetine  (CYMBALTA ) 30 MG capsule Take 1 capsule (30 mg total) by mouth daily. 08/29/23   Kayla Jeoffrey RAMAN, FNP  empagliflozin  (JARDIANCE ) 25 MG TABS tablet Take 1 tab prior to breakfast daily for diabetes. Patient not taking: Reported on 09/14/2023 06/12/21   Jeanine Knee, NP  ferrous sulfate 325 (65 FE) MG tablet Take by mouth.    [provider]  gabapentin  (NEURONTIN ) 600 MG tablet TAKE 1/2 TO 1 TABLET 2 TO 3 TIMES DAILY AS NEEDED FOR NEUROPATHY PAIN 07/05/23   Kayla Jeoffrey RAMAN, FNP  lisinopril -hydrochlorothiazide   (ZESTORETIC ) 10-12.5 MG tablet TAKE 1 TABLET DAILY FOR BLOOD PRESSURE AND DIABETIC KIDNEY PROTECTION 11/08/22   Wilkinson, Dana E, NP  meloxicam  (MOBIC ) 15 MG tablet TAKE 1/2 TO 1 TABLET DAILY WITH FOOD FOR PAIN AND INFLAMMATION 11/08/22   Wilkinson, Dana E, NP  METAMUCIL FIBER PO Take 1 tablet by mouth daily.    [provider]  metFORMIN  (GLUCOPHAGE -XR) 500 MG 24 hr tablet TAKE 2 TABLETS TWICE A DAY WITH MEALS 08/30/23   Duanne [place] DASEN, MD  metoprolol  succinate (TOPROL -XL) 25 MG 24 hr tablet TAKE 1 TABLET EVERY DAY FOR BLOOD PRESSURE 09/07/23   Duanne [place] DASEN, MD  mometasone  (NASONEX ) 50 MCG/ACT nasal spray Place 2 sprays into the nose daily. Patient taking differently: Place 2 sprays into the nose as needed. 06/30/18 09/14/23  Tonita Fallow, MD  Multiple Vitamin (MULITIVITAMIN WITH MINERALS) TABS Take 1 tablet by mouth daily.    [provider]  pantoprazole  (PROTONIX ) 40 MG tablet TAKE 1 TABLET BY MOUTH ONCE DAILY TO  PREVENT  INDIGESTION  AND  HEARTBURN 07/05/23   Kayla Jeoffrey RAMAN, FNP  potassium chloride  SA (KLOR-CON  M) 20 MEQ tablet Take 1 tablet 3 x /day with Meals for Potassium                                                                /                                           TAKE                                      BY                                       MOUTH 07/05/23   Howard, Amber S, FNP  rOPINIRole  (REQUIP ) 1 MG tablet Take  1 tablet 3 x / day as Needed for Restless Legs        /       TAKE      BY      MOUTH 07/05/23   Kayla Jeoffrey RAMAN, FNP  rosuvastatin  (CRESTOR ) 20 MG tablet TAKE 1 TABLET EVERY DAY FOR CHOLESTEROL 09/07/23   Pickard,  [place] DASEN, MD  RYBELSUS  3 MG TABS Take 1 tablet by mouth every day 08/31/23   Howard, Amber S, FNP  Turmeric (QC TUMERIC COMPLEX PO) Take 1 capsule by mouth daily.    [provider]  Zinc 30 MG CAPS Take 1 capsule by mouth daily.    [provider]    Family History Family History  Problem Relation Age of  Onset   Coronary artery disease Mother    Heart disease Mother    Hypertension Mother    Sleep apnea Mother    Diabetes Father    Hypertension Father    Diabetes Son    Breast cancer Maternal Aunt     Social History Social History   Tobacco Use   Smoking status: Never   Smokeless tobacco: Never  Vaping Use   Vaping status: Never Used  Substance Use Topics   Alcohol use: Not Currently   Drug use: No     Allergies   Naprosyn [naproxen]   Review of Systems Review of Systems  Constitutional:  Negative for chills and fever.  HENT:  Negative for ear pain and sore throat.   Eyes:  Negative for pain and visual disturbance.  Respiratory:  Positive for cough. Negative for shortness of breath.   Cardiovascular:  Negative for chest pain and palpitations.  Gastrointestinal:  Positive for constipation. Negative for abdominal pain and vomiting.  Genitourinary:  Negative for dysuria and hematuria.  Musculoskeletal:  Negative for arthralgias and back pain.  Skin:  Negative for color change and rash.  Neurological:  Negative for seizures and syncope.  All other systems reviewed and are negative.    Physical Exam Triage Vital Signs ED Triage Vitals [10/07/23 1625]  Encounter Vitals Group     BP 131/73     Girls Systolic BP Percentile      Girls Diastolic BP Percentile      Boys Systolic BP Percentile      Boys Diastolic BP Percentile      Pulse Rate 76     Resp 14     Temp 98.2 F (36.8 C)     Temp Source Oral     SpO2 92 %     Weight      Height      Head Circumference      Peak Flow      Pain Score 0     Pain Loc      Pain Education      Exclude from Growth Chart    No data found.  Updated Vital Signs BP 131/73 (BP Location: Left Arm)   Pulse 76   Temp 98.2 F (36.8 C) (Oral)   Resp 14   SpO2 92%   Visual Acuity Right Eye Distance:   Left Eye Distance:   Bilateral Distance:    Right Eye Near:   Left Eye Near:    Bilateral Near:     Physical  Exam Vitals and nursing note reviewed.  Constitutional:      General: She is not in acute distress.    Appearance: She is well-developed.  HENT:     Head: Normocephalic and atraumatic.  Eyes:     Conjunctiva/sclera: Conjunctivae normal.  Cardiovascular:     Rate and Rhythm: Normal rate and regular rhythm.     Heart sounds: No murmur heard. Pulmonary:     Effort: Pulmonary effort is normal. No respiratory distress.     Breath sounds: Normal breath sounds.  Abdominal:  Palpations: Abdomen is soft.     Tenderness: There is no abdominal tenderness.  Musculoskeletal:        General: No swelling.     Cervical back: Neck supple.  Skin:    General: Skin is warm and dry.     Capillary Refill: Capillary refill takes less than 2 seconds.  Neurological:     Mental Status: She is alert.  Psychiatric:        Mood and Affect: Mood normal.      UC Treatments / Results  Labs (all labs ordered are listed, but only abnormal results are displayed) Labs Reviewed - No data to display  EKG   Radiology DG Chest 2 View Result Date: 10/07/2023 CLINICAL DATA:  Nonproductive cough for 2 weeks. EXAM: CHEST - 2 VIEW COMPARISON:  11/25/2022 FINDINGS: The heart size and mediastinal contours are within normal limits. Both lungs are clear. IMPRESSION: No active cardiopulmonary disease. Electronically Signed   By: [name] DELENA Kil M.D.   On: 10/07/2023 17:28    Procedures Procedures (including critical care time)  Medications Ordered in UC Medications - No data to display  Initial Impression / Assessment and Plan / UC Course  I have reviewed the triage vital signs and the nursing notes.  Pertinent labs & imaging results that were available during my care of the patient were reviewed by me and considered in my medical decision making (see chart for details).     Chest x-ray normal, will treat for acute bronchitis.  For constipation recommend increasing fiber intake and follow-up with primary  care if no improvement.  Patient well-appearing in no acute distress, vitals within normal limits. Final Clinical Impressions(s) / UC Diagnoses   Final diagnoses:  Acute cough  Acute bronchitis, unspecified organism     Discharge Instructions      Chest xray is normal.  Take antibiotic as prescribed. Take Tessalon  as needed for cough. Continue with Mucinex. Drink plenty of fluids. For constipation recommend increasing fiber intake or take a fiber supplement and follow-up with primary care physician if this continues.   ED Prescriptions     Medication Sig Dispense Auth. Provider   azithromycin  (ZITHROMAX  Z-PAK) 250 MG tablet Take 1 tablet (250 mg total) by mouth daily for 5 days. Take two tablets by mouth on the first day and then one tablet by mouth on remaining days 6 tablet Ward, Harlene PEDLAR, PA-C   benzonatate  (TESSALON ) 100 MG capsule Take 1 capsule (100 mg total) by mouth every 8 (eight) hours. 21 capsule Ward, Orlean Holtrop Z, PA-C      PDMP not reviewed this encounter.   Ward, Harlene PEDLAR, PA-C 10/07/23 [phone]

## 2023-10-11 ENCOUNTER — Telehealth: Admitting: Family Medicine

## 2023-10-11 ENCOUNTER — Ambulatory Visit (INDEPENDENT_AMBULATORY_CARE_PROVIDER_SITE_OTHER): Admitting: Family Medicine

## 2023-10-11 ENCOUNTER — Encounter: Payer: Self-pay | Admitting: Family Medicine

## 2023-10-11 VITALS — BP 117/74 | HR 91 | Temp 98.0°F | Ht <= 58 in | Wt 126.6 lb

## 2023-10-11 DIAGNOSIS — N181 Chronic kidney disease, stage 1: Secondary | ICD-10-CM

## 2023-10-11 DIAGNOSIS — J069 Acute upper respiratory infection, unspecified: Secondary | ICD-10-CM | POA: Diagnosis not present

## 2023-10-11 DIAGNOSIS — E1122 Type 2 diabetes mellitus with diabetic chronic kidney disease: Secondary | ICD-10-CM | POA: Diagnosis not present

## 2023-10-11 MED ORDER — PIOGLITAZONE HCL 30 MG PO TABS: 30.0000 mg | ORAL_TABLET | Freq: Every day | ORAL | 1 refills | Status: DC

## 2023-10-11 MED ORDER — HYDROCODONE BIT-HOMATROP MBR 5-1.5 MG/5ML PO SOLN: 5.0000 mL | Freq: Three times a day (TID) | ORAL | 0 refills | Status: DC | PRN

## 2023-10-11 NOTE — Assessment & Plan Note (Signed)
 Last A1c 8.6%. unable to tolerate rybelsus , start actos  30mg  daily, continue metformin  2000mg  BID. Recommend heart healthy diet such as Mediterranean diet with whole grains, fruits, vegetable, fish, lean meats, nuts, and olive oil. Limit salt. Encouraged moderate walking, 3-5 times/week for 30-50 minutes each session. Aim for at least 150 minutes.week. Goal should be pace of 3 miles/hours, or walking 1.5 miles in 30 minutes. Seek medical care for urinary frequency, extreme thirst, vision changes, lightheadedness, dizziness.  Follow up in 3 months for A1c check. Referral to Endo for assistance with disease management

## 2023-10-11 NOTE — Assessment & Plan Note (Signed)
 Reassured patient that symptoms and exam findings are most consistent with a viral upper respiratory infection and explained lack of efficacy of antibiotics against viruses.  Discussed expected course and features suggestive of secondary bacterial infection.  Continue supportive care. Increase fluid intake with water or electrolyte solution like pedialyte. Encouraged acetaminophen  as needed for fever/pain. Encouraged salt water gargling, chloraseptic spray and throat lozenges. Encouraged OTC guaifenesin. Encouraged saline sinus flushes and/or neti with humidified air.  Start Hycodan PRN for cough, discussed risks of sedation and precautions when taking.

## 2023-10-11 NOTE — Progress Notes (Signed)
 Subjective:  HPI: [name] is a 68 y.o. female presenting on 10/11/2023 for Acute Visit (Chronic cough: was seen in UC this past Friday for cough they prescribed Benzonatate  100 mg, and Azithromycin  250 mb tablets 6- pac. She has also been taking nyquil , cough drops, mucinex nothing is helping. The cough is worse at night when she lays down. Cough has lasted for the last two weeks and has persisted and gotten worse. Her energy has decreased since cough has started. )   HPI Patient is in today for cough and bronchitis for 2 weeks. Was seen in UC last Friday and diagnosed with bronchitis, no viral testing was done, CXR was negative. Was prescribed Z-pack and benzonatate  without relief. Has also tried nyquil, cough drops, mucinex. It is worse at night. Associated with fatigue.  Denies fever, chills, nausea, vomiting, SOB, wheezing, pleurisy. Hx includes asthma. Used her Albuterol  once.  Her son also has a cough, negative home covid test.   Other concerns include uncontrolled diabetes, Last A1c was 8.6% and she is not tolerating Rybelsus  for 1.5-2 weeks. Has stopped taking this due to GI upset. Is not willing to try a weekly injectable or insulin . Not monitoring BG at home. Is also taking Metformin  2000mg  BID. Jardiance  was DC previously.  Review of Systems  All other systems reviewed and are negative.   Relevant past medical history reviewed and updated as indicated.   Past Medical History:  Diagnosis Date   Allergic rhinitis    ASTHMA    BICUSPID AORTIC VALVE    CEREBROVASCULAR DISEASE    DEGENERATIVE JOINT DISEASE    Diabetes mellitus    DJD (degenerative joint disease)    GERD    HSV-1 (herpes simplex virus 1) infection    HYPERLIPIDEMIA    Hypertension    Palpitation    Vitamin D  deficiency      Past Surgical History:  Procedure Laterality Date   ABDOMINAL HYSTERECTOMY     EYE SURGERY     repair of trigger finger      Allergies and medications reviewed and  updated.   Current Outpatient Medications:    aspirin  EC 81 MG tablet, Take 81 mg by mouth daily., Disp: , Rfl:    azithromycin  (ZITHROMAX  Z-PAK) 250 MG tablet, Take 1 tablet (250 mg total) by mouth daily for 5 days. Take two tablets by mouth on the first day and then one tablet by mouth on remaining days, Disp: 6 tablet, Rfl: 0   B Complex-C (SUPER B COMPLEX PO), Take 1 tablet by mouth daily. Takes every other day, Disp: , Rfl:    benzonatate  (TESSALON ) 100 MG capsule, Take 1 capsule (100 mg total) by mouth every 8 (eight) hours., Disp: 21 capsule, Rfl: 0   cetirizine (ZYRTEC) 10 MG chewable tablet, Chew 10 mg by mouth every other day., Disp: , Rfl:    Cholecalciferol (VITAMIN D  PO), Take 2 capsules by mouth daily. Take 5000 units by mouth twice a day, Disp: , Rfl:    cyclobenzaprine  (FLEXERIL ) 10 MG tablet, TAKE 1/2 TO 1 TABLET AT BEDTIME FOR MUSCLE SPASM (Patient taking differently: Take 10 mg by mouth 3 (three) times daily as needed for muscle spasms. Take 1/2 to 1 tablet at Bedtime for Muscle Spasm), Disp: 60 tablet, Rfl: 5   docusate sodium (COLACE) 100 MG capsule, Take 100 mg by mouth daily., Disp: , Rfl:    DULoxetine  (CYMBALTA ) 30 MG capsule, Take 1 capsule (30 mg total) by mouth  daily., Disp: 30 capsule, Rfl: 3   ferrous sulfate 325 (65 FE) MG tablet, Take by mouth., Disp: , Rfl:    gabapentin  (NEURONTIN ) 600 MG tablet, TAKE 1/2 TO 1 TABLET 2 TO 3 TIMES DAILY AS NEEDED FOR NEUROPATHY PAIN, Disp: 270 tablet, Rfl: 3   HYDROcodone  bit-homatropine (HYCODAN) 5-1.5 MG/5ML syrup, Take 5 mLs by mouth every 8 (eight) hours as needed for cough., Disp: 60 mL, Rfl: 0   lisinopril -hydrochlorothiazide  (ZESTORETIC ) 10-12.5 MG tablet, TAKE 1 TABLET DAILY FOR BLOOD PRESSURE AND DIABETIC KIDNEY PROTECTION, Disp: 90 tablet, Rfl: 3   meloxicam  (MOBIC ) 15 MG tablet, TAKE 1/2 TO 1 TABLET DAILY WITH FOOD FOR PAIN AND INFLAMMATION, Disp: 90 tablet, Rfl: 3   METAMUCIL FIBER PO, Take 1 tablet by mouth daily.,  Disp: , Rfl:    metFORMIN  (GLUCOPHAGE -XR) 500 MG 24 hr tablet, TAKE 2 TABLETS TWICE A DAY WITH MEALS, Disp: 360 tablet, Rfl: 3   metoprolol  succinate (TOPROL -XL) 25 MG 24 hr tablet, TAKE 1 TABLET EVERY DAY FOR BLOOD PRESSURE, Disp: 90 tablet, Rfl: 3   mometasone  (NASONEX ) 50 MCG/ACT nasal spray, Place 2 sprays into the nose daily. (Patient taking differently: Place 2 sprays into the nose as needed.), Disp: 51 g, Rfl: 3   Multiple Vitamin (MULITIVITAMIN WITH MINERALS) TABS, Take 1 tablet by mouth daily., Disp: , Rfl:    pantoprazole  (PROTONIX ) 40 MG tablet, TAKE 1 TABLET BY MOUTH ONCE DAILY TO  PREVENT  INDIGESTION  AND  HEARTBURN, Disp: 90 tablet, Rfl: 3   pioglitazone  (ACTOS ) 30 MG tablet, Take 1 tablet (30 mg total) by mouth daily., Disp: 90 tablet, Rfl: 1   potassium chloride  SA (KLOR-CON  M) 20 MEQ tablet, Take 1 tablet 3 x /day with Meals for Potassium                                                                /                                           TAKE                                      BY                                       MOUTH, Disp: 270 tablet, Rfl: 3   rOPINIRole  (REQUIP ) 1 MG tablet, Take  1 tablet 3 x / day as Needed for Restless Legs        /       TAKE      BY      MOUTH, Disp: 270 tablet, Rfl: 3   rosuvastatin  (CRESTOR ) 20 MG tablet, TAKE 1 TABLET EVERY DAY FOR CHOLESTEROL, Disp: 90 tablet, Rfl: 3   Turmeric (QC TUMERIC COMPLEX PO), Take 1 capsule by mouth daily., Disp: , Rfl:    Zinc 30 MG CAPS, Take 1 capsule by mouth daily., Disp: , Rfl:    blood glucose meter kit  and supplies KIT, Dispense based on patient and insurance preference. Use up to four times daily as directed. (Patient not taking: Reported on 10/11/2023), Disp: 1 each, Rfl: 0   empagliflozin  (JARDIANCE ) 25 MG TABS tablet, Take 1 tab prior to breakfast daily for diabetes. (Patient not taking: Reported on 10/11/2023), Disp: 90 tablet, Rfl: 3   RYBELSUS  3 MG TABS, Take 1 tablet by mouth every day (Patient not  taking: Reported on 10/11/2023), Disp: 90 tablet, Rfl: 11  Allergies  Allergen Reactions   Naprosyn [Naproxen] Other (See Comments)    GI upset    Objective:   BP 117/74   Pulse 91   Temp 98 F (36.7 C)   Ht 4' 9 (1.448 m)   Wt 126 lb 9.6 oz (57.4 kg)   SpO2 97%   BMI 27.40 kg/m      10/11/2023   10:10 AM 10/07/2023    4:25 PM 09/14/2023   10:09 AM  Vitals with BMI  Height 4' 9  4' 9  Weight 126 lbs 10 oz  125 lbs 13 oz  BMI 27.39  27.22  Systolic 117 131 875  Diastolic 74 73 68  Pulse 91 76 86     Physical Exam Vitals and nursing note reviewed.  Constitutional:      Appearance: Normal appearance. She is normal weight.  HENT:     Head: Normocephalic and atraumatic.     Right Ear: Tympanic membrane, ear canal and external ear normal.     Left Ear: Tympanic membrane, ear canal and external ear normal.     Nose: Nose normal.     Right Sinus: No maxillary sinus tenderness or frontal sinus tenderness.     Left Sinus: No maxillary sinus tenderness or frontal sinus tenderness.     Mouth/Throat:     Mouth: Mucous membranes are dry.     Pharynx: Oropharynx is clear.  Eyes:     Extraocular Movements: Extraocular movements intact.     Conjunctiva/sclera: Conjunctivae normal.     Pupils: Pupils are equal, round, and reactive to light.  Cardiovascular:     Rate and Rhythm: Normal rate and regular rhythm.     Pulses: Normal pulses.     Heart sounds: Normal heart sounds.  Pulmonary:     Effort: Pulmonary effort is normal.     Breath sounds: Normal breath sounds.  Musculoskeletal:     Cervical back: No tenderness.  Lymphadenopathy:     Cervical: No cervical adenopathy.  Skin:    General: Skin is warm and dry.  Neurological:     General: No focal deficit present.     Mental Status: She is alert and oriented to person, place, and time. Mental status is at baseline.  Psychiatric:        Mood and Affect: Mood normal.        Behavior: Behavior normal.        Thought  Content: Thought content normal.        Judgment: Judgment normal.     Assessment & Plan:  Type 2 diabetes mellitus with stage 1 chronic kidney disease, without long-term current use of insulin  (HCC) Assessment & Plan: Last A1c 8.6%. unable to tolerate rybelsus , start actos  30mg  daily, continue metformin  2000mg  BID. Recommend heart healthy diet such as Mediterranean diet with whole grains, fruits, vegetable, fish, lean meats, nuts, and olive oil. Limit salt. Encouraged moderate walking, 3-5 times/week for 30-50 minutes each session. Aim for at least 150 minutes.week. Goal should  be pace of 3 miles/hours, or walking 1.5 miles in 30 minutes. Seek medical care for urinary frequency, extreme thirst, vision changes, lightheadedness, dizziness.  Follow up in 3 months for A1c check. Referral to Endo for assistance with disease management  Orders: -     Ambulatory referral to Endocrinology -     Hemoglobin A1c  Viral URI with cough Assessment & Plan: Reassured patient that symptoms and exam findings are most consistent with a viral upper respiratory infection and explained lack of efficacy of antibiotics against viruses.  Discussed expected course and features suggestive of secondary bacterial infection.  Continue supportive care. Increase fluid intake with water or electrolyte solution like pedialyte. Encouraged acetaminophen  as needed for fever/pain. Encouraged salt water gargling, chloraseptic spray and throat lozenges. Encouraged OTC guaifenesin. Encouraged saline sinus flushes and/or neti with humidified air.  Start Hycodan PRN for cough, discussed risks of sedation and precautions when taking.    Other orders -     HYDROcodone  Bit-Homatrop MBr; Take 5 mLs by mouth every 8 (eight) hours as needed for cough.  Dispense: 60 mL; Refill: 0 -     Pioglitazone  HCl; Take 1 tablet (30 mg total) by mouth daily.  Dispense: 90 tablet; Refill: 1     Follow up plan: Return in about 3 months (around  01/11/2024) for chronic follow-up with labs 1 week prior.  Jeoffrey GORMAN Barrio, FNP

## 2023-10-12 ENCOUNTER — Other Ambulatory Visit

## 2023-10-12 LAB — HEMOGLOBIN A1C
Hgb A1c MFr Bld: 8.4 % — ABNORMAL HIGH (ref <5.7)
Mean Plasma Glucose: 194 mg/dL
eAG (mmol/L): 10.8 mmol/L

## 2023-10-14 ENCOUNTER — Telehealth: Payer: Self-pay | Admitting: Family Medicine

## 2023-10-14 NOTE — Telephone Encounter (Signed)
 Refill not appropriate at this time pt. Is not available for refill

## 2023-10-14 NOTE — Telephone Encounter (Signed)
 Prescription Request  10/14/2023  LOV: 10/11/2023  What is the name of the medication or equipment? DULoxetine  (CYMBALTA ) 30 MG capsule   Have you contacted your pharmacy to request a refill? Yes   Which pharmacy would you like this sent to?  [facility] - [place], [place] - [address] [place] [address] Phone: [phone] Fax: [phone]    Patient notified that their request is being sent to the clinical staff for review and that they should receive a response within 2 business days.   Please advise at [facility] [phone]

## 2023-10-18 ENCOUNTER — Other Ambulatory Visit: Payer: Self-pay | Admitting: Family Medicine

## 2023-10-18 ENCOUNTER — Telehealth: Payer: Self-pay | Admitting: Family Medicine

## 2023-10-18 ENCOUNTER — Other Ambulatory Visit: Payer: Self-pay

## 2023-10-18 DIAGNOSIS — I10 Essential (primary) hypertension: Secondary | ICD-10-CM

## 2023-10-18 DIAGNOSIS — N181 Chronic kidney disease, stage 1: Secondary | ICD-10-CM

## 2023-10-18 DIAGNOSIS — M5432 Sciatica, left side: Secondary | ICD-10-CM

## 2023-10-18 MED ORDER — LISINOPRIL-HYDROCHLOROTHIAZIDE 10-12.5 MG PO TABS: ORAL_TABLET | ORAL | 3 refills | Status: DC

## 2023-10-18 NOTE — Telephone Encounter (Signed)
 Prescription Request  10/18/2023  LOV: 10/11/2023  What is the name of the medication or equipment? meloxicam  (MOBIC ) 15 MG tablet   Have you contacted your pharmacy to request a refill? Yes   Which pharmacy would you like this sent to?  [facility] - [place], [place] - [address] [place] [address] Phone: [phone] Fax: [phone]    Patient notified that their request is being sent to the clinical staff for review and that they should receive a response within 2 business days.   Please advise at [facility] [phone]

## 2023-10-18 NOTE — Telephone Encounter (Signed)
 Requested Prescriptions  Pending Prescriptions Disp Refills   meloxicam  (MOBIC ) 15 MG tablet 90 tablet 3    Sig: TAKE 1/2 TO 1 TABLET DAILY WITH FOOD FOR PAIN AND INFLAMMATION     Analgesics:  COX2 Inhibitors Failed - 10/18/2023  5:15 PM      Failed - Manual Review: Labs are only required if the patient has taken medication for more than 8 weeks.      Failed - Cr in normal range and within 360 days    Creat  Date Value Ref Range Status  07/08/2023 0.48 (L) 0.50 - 1.05 mg/dL Final   Creatinine, Urine  Date Value Ref Range Status  07/08/2023 68 20 - 275 mg/dL Final         Passed - HGB in normal range and within 360 days    Hemoglobin  Date Value Ref Range Status  07/08/2023 12.2 11.7 - 15.5 g/dL Final   HGB  Date Value Ref Range Status  10/31/2009 12.5 11.6 - 15.9 g/dL Final         Passed - HCT in normal range and within 360 days    HCT  Date Value Ref Range Status  07/08/2023 38.6 35.0 - 45.0 % Final  10/31/2009 38.4 34.8 - 46.6 % Final         Passed - AST in normal range and within 360 days    AST  Date Value Ref Range Status  07/08/2023 12 10 - 35 U/L Final         Passed - ALT in normal range and within 360 days    ALT  Date Value Ref Range Status  07/08/2023 17 6 - 29 U/L Final         Passed - eGFR is 30 or above and within 360 days    GFR, Est African American  Date Value Ref Range Status  07/29/2020 120 > OR = 60 mL/min/1.53m2 Final   GFR, Est Non African American  Date Value Ref Range Status  07/29/2020 104 > OR = 60 mL/min/1.50m2 Final   GFR, Estimated  Date Value Ref Range Status  11/25/2022 >60 >60 mL/min Final    Comment:    (NOTE) Calculated using the CKD-EPI Creatinine Equation (2021)    GFR  Date Value Ref Range Status  05/05/2012 121.03 >60.00 mL/min Final   eGFR  Date Value Ref Range Status  07/08/2023 104 > OR = 60 mL/min/1.84m2 Final         Passed - Patient is not pregnant      Passed - Valid encounter within last 12  months    Recent Outpatient Visits           1 week ago Type 2 diabetes mellitus with stage 1 chronic kidney disease, without long-term current use of insulin  (HCC)   [place] [facility] Kayla Jeoffrey RAMAN, FNP   1 month ago Swelling of joint of right hand   [place] [facility] Kayla Jeoffrey RAMAN, FNP   5 months ago Physical exam, annual   [place] [facility] Kayla Jeoffrey RAMAN, FNP       Future Appointments             In 3 months Therisa, Benton PARAS, NP [facility]            Refused Prescriptions Disp Refills   lisinopril -hydrochlorothiazide  (ZESTORETIC ) 10-12.5 MG tablet 90 tablet 3    Sig:  TAKE 1 TABLET DAILY FOR BLOOD PRESSURE AND DIABETIC KIDNEY PROTECTION     Cardiovascular:  ACEI + Diuretic Combos Failed - 10/18/2023  5:15 PM      Failed - Cr in normal range and within 180 days    Creat  Date Value Ref Range Status  07/08/2023 0.48 (L) 0.50 - 1.05 mg/dL Final   Creatinine, Urine  Date Value Ref Range Status  07/08/2023 68 20 - 275 mg/dL Final         Passed - Na in normal range and within 180 days    Sodium  Date Value Ref Range Status  07/08/2023 137 135 - 146 mmol/L Final  02/22/2020 139 134 - 144 mmol/L Final         Passed - K in normal range and within 180 days    Potassium  Date Value Ref Range Status  07/08/2023 4.5 3.5 - 5.3 mmol/L Final         Passed - eGFR is 30 or above and within 180 days    GFR, Est African American  Date Value Ref Range Status  07/29/2020 120 > OR = 60 mL/min/1.81m2 Final   GFR, Est Non African American  Date Value Ref Range Status  07/29/2020 104 > OR = 60 mL/min/1.84m2 Final   GFR, Estimated  Date Value Ref Range Status  11/25/2022 >60 >60 mL/min Final    Comment:    (NOTE) Calculated using the CKD-EPI Creatinine Equation (2021)    GFR  Date Value Ref Range Status  05/05/2012 121.03 >60.00 mL/min Final   eGFR  Date  Value Ref Range Status  07/08/2023 104 > OR = 60 mL/min/1.89m2 Final         Passed - Patient is not pregnant      Passed - Last BP in normal range    BP Readings from Last 1 Encounters:  10/11/23 117/74         Passed - Valid encounter within last 6 months    Recent Outpatient Visits           1 week ago Type 2 diabetes mellitus with stage 1 chronic kidney disease, without long-term current use of insulin  (HCC)   [facility] Kayla Jeoffrey RAMAN, FNP   1 month ago Swelling of joint of right hand   [place] [facility] Kayla Jeoffrey RAMAN, FNP   5 months ago Physical exam, annual   [place] [facility] Kayla Jeoffrey RAMAN, FNP       Future Appointments             In 3 months Therisa Benton PARAS, NP [facility]

## 2023-10-18 NOTE — Telephone Encounter (Signed)
 Prescription Request  10/18/2023  LOV: 10/11/2023  What is the name of the medication or equipment? lisinopril -hydrochlorothiazide  (ZESTORETIC ) 10-12.5 MG tablet  Have you contacted your pharmacy to request a refill? Yes   Which pharmacy would you like this sent to?  [facility] - [place], [place] - [address] [place] [address] Phone: [phone] Fax: [phone]    Patient notified that their request is being sent to the clinical staff for review and that they should receive a response within 2 business days.   Please advise at [facility] [phone]

## 2023-10-18 NOTE — Telephone Encounter (Signed)
 Requested medications are due for refill today.  yes  Requested medications are on the active medications list.  yes  Last refill. 11/08/2022 #90 3 rf  Future visit scheduled.   yes  Notes to clinic.  Rx signed by Lonell Liverpool.    Requested Prescriptions  Pending Prescriptions Disp Refills   meloxicam  (MOBIC ) 15 MG tablet 90 tablet 3    Sig: TAKE 1/2 TO 1 TABLET DAILY WITH FOOD FOR PAIN AND INFLAMMATION     Analgesics:  COX2 Inhibitors Failed - 10/18/2023  5:16 PM      Failed - Manual Review: Labs are only required if the patient has taken medication for more than 8 weeks.      Failed - Cr in normal range and within 360 days    Creat  Date Value Ref Range Status  07/08/2023 0.48 (L) 0.50 - 1.05 mg/dL Final   Creatinine, Urine  Date Value Ref Range Status  07/08/2023 68 20 - 275 mg/dL Final         Passed - HGB in normal range and within 360 days    Hemoglobin  Date Value Ref Range Status  07/08/2023 12.2 11.7 - 15.5 g/dL Final   HGB  Date Value Ref Range Status  10/31/2009 12.5 11.6 - 15.9 g/dL Final         Passed - HCT in normal range and within 360 days    HCT  Date Value Ref Range Status  07/08/2023 38.6 35.0 - 45.0 % Final  10/31/2009 38.4 34.8 - 46.6 % Final         Passed - AST in normal range and within 360 days    AST  Date Value Ref Range Status  07/08/2023 12 10 - 35 U/L Final         Passed - ALT in normal range and within 360 days    ALT  Date Value Ref Range Status  07/08/2023 17 6 - 29 U/L Final         Passed - eGFR is 30 or above and within 360 days    GFR, Est African American  Date Value Ref Range Status  07/29/2020 120 > OR = 60 mL/min/1.26m2 Final   GFR, Est Non African American  Date Value Ref Range Status  07/29/2020 104 > OR = 60 mL/min/1.5m2 Final   GFR, Estimated  Date Value Ref Range Status  11/25/2022 >60 >60 mL/min Final    Comment:    (NOTE) Calculated using the CKD-EPI Creatinine Equation (2021)    GFR  Date  Value Ref Range Status  05/05/2012 121.03 >60.00 mL/min Final   eGFR  Date Value Ref Range Status  07/08/2023 104 > OR = 60 mL/min/1.75m2 Final         Passed - Patient is not pregnant      Passed - Valid encounter within last 12 months    Recent Outpatient Visits           1 week ago Type 2 diabetes mellitus with stage 1 chronic kidney disease, without long-term current use of insulin  (HCC)   [place] [facility] Kayla Jeoffrey RAMAN, FNP   1 month ago Swelling of joint of right hand   [place] [facility] Kayla Jeoffrey RAMAN, FNP   5 months ago Physical exam, annual   [place] [facility] Kayla Jeoffrey RAMAN, FNP       Future Appointments  In 3 months Therisa Benton PARAS, NP [facility]            Refused Prescriptions Disp Refills   lisinopril -hydrochlorothiazide  (ZESTORETIC ) 10-12.5 MG tablet 90 tablet 3    Sig: TAKE 1 TABLET DAILY FOR BLOOD PRESSURE AND DIABETIC KIDNEY PROTECTION     Cardiovascular:  ACEI + Diuretic Combos Failed - 10/18/2023  5:16 PM      Failed - Cr in normal range and within 180 days    Creat  Date Value Ref Range Status  07/08/2023 0.48 (L) 0.50 - 1.05 mg/dL Final   Creatinine, Urine  Date Value Ref Range Status  07/08/2023 68 20 - 275 mg/dL Final         Passed - Na in normal range and within 180 days    Sodium  Date Value Ref Range Status  07/08/2023 137 135 - 146 mmol/L Final  02/22/2020 139 134 - 144 mmol/L Final         Passed - K in normal range and within 180 days    Potassium  Date Value Ref Range Status  07/08/2023 4.5 3.5 - 5.3 mmol/L Final         Passed - eGFR is 30 or above and within 180 days    GFR, Est African American  Date Value Ref Range Status  07/29/2020 120 > OR = 60 mL/min/1.27m2 Final   GFR, Est Non African American  Date Value Ref Range Status  07/29/2020 104 > OR = 60 mL/min/1.48m2 Final   GFR, Estimated   Date Value Ref Range Status  11/25/2022 >60 >60 mL/min Final    Comment:    (NOTE) Calculated using the CKD-EPI Creatinine Equation (2021)    GFR  Date Value Ref Range Status  05/05/2012 121.03 >60.00 mL/min Final   eGFR  Date Value Ref Range Status  07/08/2023 104 > OR = 60 mL/min/1.31m2 Final         Passed - Patient is not pregnant      Passed - Last BP in normal range    BP Readings from Last 1 Encounters:  10/11/23 117/74         Passed - Valid encounter within last 6 months    Recent Outpatient Visits           1 week ago Type 2 diabetes mellitus with stage 1 chronic kidney disease, without long-term current use of insulin  (HCC)   [place] [facility] Kayla Jeoffrey RAMAN, FNP   1 month ago Swelling of joint of right hand   [place] [facility] Kayla Jeoffrey RAMAN, FNP   5 months ago Physical exam, annual    [facility] Kayla Jeoffrey RAMAN, FNP       Future Appointments             In 3 months Therisa Benton PARAS, NP [facility]

## 2023-10-19 ENCOUNTER — Other Ambulatory Visit: Payer: Self-pay | Admitting: Family Medicine

## 2023-10-19 ENCOUNTER — Ambulatory Visit: Admitting: Family Medicine

## 2023-10-19 DIAGNOSIS — M5432 Sciatica, left side: Secondary | ICD-10-CM

## 2023-10-19 DIAGNOSIS — E1122 Type 2 diabetes mellitus with diabetic chronic kidney disease: Secondary | ICD-10-CM

## 2023-10-19 DIAGNOSIS — I10 Essential (primary) hypertension: Secondary | ICD-10-CM

## 2023-10-19 NOTE — Telephone Encounter (Unsigned)
 Copied from CRM #[id number]. Topic: Clinical - Medication Refill >> Oct 19, 2023 12:21 PM Sophia H wrote: Medication: meloxicam  (MOBIC ) 15 MG tablet lisinopril -hydrochlorothiazide  (ZESTORETIC ) 10-12.5 MG tablet  Has the patient contacted their pharmacy? Yes, pharmacy calling on behalf of the patient for refill.   This is the patient's preferred pharmacy:  [facility] - [place], [place] - [address]  Is this the correct pharmacy for this prescription? Yes If no, delete pharmacy and type the correct one.   Has the prescription been filled recently? Yes  Is the patient out of the medication? Yes  Has the patient been seen for an appointment in the last year OR does the patient have an upcoming appointment? Yes, seen August 26  Can we respond through MyChart? Yes  Agent: Please be advised that Rx refills may take up to 3 business days. We ask that you follow-up with your pharmacy.

## 2023-10-20 MED ORDER — MELOXICAM 15 MG PO TABS
ORAL_TABLET | ORAL | 3 refills | Status: AC
Start: 2023-10-20 — End: ?

## 2023-10-20 MED ORDER — LISINOPRIL-HYDROCHLOROTHIAZIDE 10-12.5 MG PO TABS
ORAL_TABLET | ORAL | 3 refills | Status: DC
Start: 2023-10-20 — End: 2023-12-09

## 2023-10-20 NOTE — Telephone Encounter (Signed)
 Requested medications are due for refill today.  yes   Requested medications are on the active medications list.  yes   Last refill. 11/08/2022 #90 3 rf   Future visit scheduled.   yes   Notes to clinic.  Rx signed by Lonell Liverpool.      Requested Prescriptions  Pending Prescriptions Disp Refills   meloxicam  (MOBIC ) 15 MG tablet 90 tablet 3    Sig: TAKE 1/2 TO 1 TABLET DAILY WITH FOOD FOR PAIN AND INFLAMMATION     Analgesics:  COX2 Inhibitors Failed - 10/20/2023  4:22 PM      Failed - Manual Review: Labs are only required if the patient has taken medication for more than 8 weeks.      Failed - Cr in normal range and within 360 days    Creat  Date Value Ref Range Status  07/08/2023 0.48 (L) 0.50 - 1.05 mg/dL Final   Creatinine, Urine  Date Value Ref Range Status  07/08/2023 68 20 - 275 mg/dL Final         Passed - HGB in normal range and within 360 days    Hemoglobin  Date Value Ref Range Status  07/08/2023 12.2 11.7 - 15.5 g/dL Final   HGB  Date Value Ref Range Status  10/31/2009 12.5 11.6 - 15.9 g/dL Final         Passed - HCT in normal range and within 360 days    HCT  Date Value Ref Range Status  07/08/2023 38.6 35.0 - 45.0 % Final  10/31/2009 38.4 34.8 - 46.6 % Final         Passed - AST in normal range and within 360 days    AST  Date Value Ref Range Status  07/08/2023 12 10 - 35 U/L Final         Passed - ALT in normal range and within 360 days    ALT  Date Value Ref Range Status  07/08/2023 17 6 - 29 U/L Final         Passed - eGFR is 30 or above and within 360 days    GFR, Est African American  Date Value Ref Range Status  07/29/2020 120 > OR = 60 mL/min/1.69m2 Final   GFR, Est Non African American  Date Value Ref Range Status  07/29/2020 104 > OR = 60 mL/min/1.16m2 Final   GFR, Estimated  Date Value Ref Range Status  11/25/2022 >60 >60 mL/min Final    Comment:    (NOTE) Calculated using the CKD-EPI Creatinine Equation (2021)    GFR   Date Value Ref Range Status  05/05/2012 121.03 >60.00 mL/min Final   eGFR  Date Value Ref Range Status  07/08/2023 104 > OR = 60 mL/min/1.23m2 Final         Passed - Patient is not pregnant      Passed - Valid encounter within last 12 months    Recent Outpatient Visits           1 week ago Type 2 diabetes mellitus with stage 1 chronic kidney disease, without long-term current use of insulin  (HCC)   [place] [facility] Kayla Jeoffrey RAMAN, FNP   1 month ago Swelling of joint of right hand   [place] [facility] Kayla Jeoffrey RAMAN, FNP   5 months ago Physical exam, annual    [facility] Kayla Jeoffrey RAMAN, FNP       Future Appointments  In 3 months Therisa Benton PARAS, NP [facility]            Refused Prescriptions Disp Refills   lisinopril -hydrochlorothiazide  (ZESTORETIC ) 10-12.5 MG tablet 90 tablet 3    Sig: TAKE 1 TABLET DAILY FOR BLOOD PRESSURE AND DIABETIC KIDNEY PROTECTION     Cardiovascular:  ACEI + Diuretic Combos Failed - 10/20/2023  4:22 PM      Failed - Cr in normal range and within 180 days    Creat  Date Value Ref Range Status  07/08/2023 0.48 (L) 0.50 - 1.05 mg/dL Final   Creatinine, Urine  Date Value Ref Range Status  07/08/2023 68 20 - 275 mg/dL Final         Passed - Na in normal range and within 180 days    Sodium  Date Value Ref Range Status  07/08/2023 137 135 - 146 mmol/L Final  02/22/2020 139 134 - 144 mmol/L Final         Passed - K in normal range and within 180 days    Potassium  Date Value Ref Range Status  07/08/2023 4.5 3.5 - 5.3 mmol/L Final         Passed - eGFR is 30 or above and within 180 days    GFR, Est African American  Date Value Ref Range Status  07/29/2020 120 > OR = 60 mL/min/1.96m2 Final   GFR, Est Non African American  Date Value Ref Range Status  07/29/2020 104 > OR = 60 mL/min/1.17m2 Final   GFR,  Estimated  Date Value Ref Range Status  11/25/2022 >60 >60 mL/min Final    Comment:    (NOTE) Calculated using the CKD-EPI Creatinine Equation (2021)    GFR  Date Value Ref Range Status  05/05/2012 121.03 >60.00 mL/min Final   eGFR  Date Value Ref Range Status  07/08/2023 104 > OR = 60 mL/min/1.37m2 Final         Passed - Patient is not pregnant      Passed - Last BP in normal range    BP Readings from Last 1 Encounters:  10/11/23 117/74         Passed - Valid encounter within last 6 months    Recent Outpatient Visits           1 week ago Type 2 diabetes mellitus with stage 1 chronic kidney disease, without long-term current use of insulin  (HCC)   [place] [facility] Kayla Jeoffrey RAMAN, FNP   1 month ago Swelling of joint of right hand   [place] [facility] Kayla Jeoffrey RAMAN, FNP   5 months ago Physical exam, annual   [place] [facility] Kayla Jeoffrey RAMAN, FNP       Future Appointments             In 3 months Therisa Benton PARAS, NP [facility]

## 2023-10-20 NOTE — Telephone Encounter (Signed)
 Requested medication (s) are due for refill today:   Yes for both  Requested medication (s) are on the active medication list:   Yes for both  Future visit scheduled:   Yes 12/4   LOV 10/11/2023 with Amber    Last ordered: Mobic  11/08/2022 #90, 3 refills;   Zestoretic  10/18/2023 #90, 3  Unable to refill Mobic  because last prescribed by another provider.   Zestoretic  already sent to [facility]   Requested Prescriptions  Pending Prescriptions Disp Refills   meloxicam  (MOBIC ) 15 MG tablet 90 tablet 3    Sig: TAKE 1/2 TO 1 TABLET DAILY WITH FOOD FOR PAIN AND INFLAMMATION     Analgesics:  COX2 Inhibitors Failed - 10/20/2023 10:44 AM      Failed - Manual Review: Labs are only required if the patient has taken medication for more than 8 weeks.      Failed - Cr in normal range and within 360 days    Creat  Date Value Ref Range Status  07/08/2023 0.48 (L) 0.50 - 1.05 mg/dL Final   Creatinine, Urine  Date Value Ref Range Status  07/08/2023 68 20 - 275 mg/dL Final         Passed - HGB in normal range and within 360 days    Hemoglobin  Date Value Ref Range Status  07/08/2023 12.2 11.7 - 15.5 g/dL Final   HGB  Date Value Ref Range Status  10/31/2009 12.5 11.6 - 15.9 g/dL Final         Passed - HCT in normal range and within 360 days    HCT  Date Value Ref Range Status  07/08/2023 38.6 35.0 - 45.0 % Final  10/31/2009 38.4 34.8 - 46.6 % Final         Passed - AST in normal range and within 360 days    AST  Date Value Ref Range Status  07/08/2023 12 10 - 35 U/L Final         Passed - ALT in normal range and within 360 days    ALT  Date Value Ref Range Status  07/08/2023 17 6 - 29 U/L Final         Passed - eGFR is 30 or above and within 360 days    GFR, Est African American  Date Value Ref Range Status  07/29/2020 120 > OR = 60 mL/min/1.10m2 Final   GFR, Est Non African American  Date Value Ref Range Status  07/29/2020 104 > OR = 60 mL/min/1.89m2 Final   GFR,  Estimated  Date Value Ref Range Status  11/25/2022 >60 >60 mL/min Final    Comment:    (NOTE) Calculated using the CKD-EPI Creatinine Equation (2021)    GFR  Date Value Ref Range Status  05/05/2012 121.03 >60.00 mL/min Final   eGFR  Date Value Ref Range Status  07/08/2023 104 > OR = 60 mL/min/1.29m2 Final         Passed - Patient is not pregnant      Passed - Valid encounter within last 12 months    Recent Outpatient Visits           1 week ago Type 2 diabetes mellitus with stage 1 chronic kidney disease, without long-term current use of insulin  (HCC)   [facility] Kayla Jeoffrey RAMAN, FNP   1 month ago Swelling of joint of right hand   [place] [facility] Kayla Jeoffrey RAMAN, FNP   5  months ago Physical exam, annual   [place] [facility] Kayla Jeoffrey RAMAN, FNP       Future Appointments             In 3 months Therisa, Benton PARAS, NP [facility]             lisinopril -hydrochlorothiazide  (ZESTORETIC ) 10-12.5 MG tablet 90 tablet 3    Sig: TAKE 1 TABLET DAILY FOR BLOOD PRESSURE AND DIABETIC KIDNEY PROTECTION     Cardiovascular:  ACEI + Diuretic Combos Failed - 10/20/2023 10:44 AM      Failed - Cr in normal range and within 180 days    Creat  Date Value Ref Range Status  07/08/2023 0.48 (L) 0.50 - 1.05 mg/dL Final   Creatinine, Urine  Date Value Ref Range Status  07/08/2023 68 20 - 275 mg/dL Final         Passed - Na in normal range and within 180 days    Sodium  Date Value Ref Range Status  07/08/2023 137 135 - 146 mmol/L Final  02/22/2020 139 134 - 144 mmol/L Final         Passed - K in normal range and within 180 days    Potassium  Date Value Ref Range Status  07/08/2023 4.5 3.5 - 5.3 mmol/L Final         Passed - eGFR is 30 or above and within 180 days    GFR, Est African American  Date Value Ref Range Status  07/29/2020 120 > OR = 60 mL/min/1.64m2  Final   GFR, Est Non African American  Date Value Ref Range Status  07/29/2020 104 > OR = 60 mL/min/1.25m2 Final   GFR, Estimated  Date Value Ref Range Status  11/25/2022 >60 >60 mL/min Final    Comment:    (NOTE) Calculated using the CKD-EPI Creatinine Equation (2021)    GFR  Date Value Ref Range Status  05/05/2012 121.03 >60.00 mL/min Final   eGFR  Date Value Ref Range Status  07/08/2023 104 > OR = 60 mL/min/1.68m2 Final         Passed - Patient is not pregnant      Passed - Last BP in normal range    BP Readings from Last 1 Encounters:  10/11/23 117/74         Passed - Valid encounter within last 6 months    Recent Outpatient Visits           1 week ago Type 2 diabetes mellitus with stage 1 chronic kidney disease, without long-term current use of insulin  (HCC)   [place] [facility] Kayla Jeoffrey RAMAN, FNP   1 month ago Swelling of joint of right hand   [place] [facility] Kayla Jeoffrey RAMAN, FNP   5 months ago Physical exam, annual   [place] [facility] Kayla Jeoffrey RAMAN, FNP       Future Appointments             In 3 months Therisa Benton PARAS, NP [facility]

## 2023-10-29 ENCOUNTER — Other Ambulatory Visit: Payer: Self-pay

## 2023-10-29 ENCOUNTER — Emergency Department (HOSPITAL_COMMUNITY)

## 2023-10-29 ENCOUNTER — Emergency Department (HOSPITAL_COMMUNITY)
Admission: EM | Admit: 2023-10-29 | Discharge: 2023-10-29 | Disposition: A | Discharge status: Home / Self Care | Attending: Emergency Medicine | Admitting: Emergency Medicine

## 2023-10-29 DIAGNOSIS — Z7984 Long term (current) use of oral hypoglycemic drugs: Secondary | ICD-10-CM | POA: Insufficient documentation

## 2023-10-29 DIAGNOSIS — R059 Cough, unspecified: Secondary | ICD-10-CM | POA: Diagnosis not present

## 2023-10-29 DIAGNOSIS — Z79899 Other long term (current) drug therapy: Secondary | ICD-10-CM | POA: Insufficient documentation

## 2023-10-29 DIAGNOSIS — R052 Subacute cough: Secondary | ICD-10-CM | POA: Diagnosis not present

## 2023-10-29 DIAGNOSIS — I10 Essential (primary) hypertension: Secondary | ICD-10-CM | POA: Insufficient documentation

## 2023-10-29 DIAGNOSIS — E119 Type 2 diabetes mellitus without complications: Secondary | ICD-10-CM | POA: Diagnosis not present

## 2023-10-29 DIAGNOSIS — J45909 Unspecified asthma, uncomplicated: Secondary | ICD-10-CM | POA: Insufficient documentation

## 2023-10-29 DIAGNOSIS — R0602 Shortness of breath: Secondary | ICD-10-CM | POA: Insufficient documentation

## 2023-10-29 DIAGNOSIS — Z7982 Long term (current) use of aspirin: Secondary | ICD-10-CM | POA: Diagnosis not present

## 2023-10-29 LAB — COMPREHENSIVE METABOLIC PANEL WITH GFR
ALT: 17 U/L (ref 0–44)
AST: 18 U/L (ref 15–41)
Albumin: 3.6 g/dL (ref 3.5–5.0)
Alkaline Phosphatase: 59 U/L (ref 38–126)
Anion gap: 8 (ref 5–15)
BUN: 13 mg/dL (ref 8–23)
CO2: 26 mmol/L (ref 22–32)
Calcium: 8.8 mg/dL — ABNORMAL LOW (ref 8.9–10.3)
Chloride: 102 mmol/L (ref 98–111)
Creatinine, Ser: 0.44 mg/dL (ref 0.44–1.00)
GFR, Estimated: >60 mL/min (ref >60)
Glucose, Bld: 118 mg/dL — ABNORMAL HIGH (ref 70–99)
Potassium: 3.7 mmol/L (ref 3.5–5.1)
Sodium: 136 mmol/L (ref 135–145)
Total Bilirubin: 0.5 mg/dL (ref 0.0–1.2)
Total Protein: 6.6 g/dL (ref 6.5–8.1)

## 2023-10-29 LAB — CBC WITH DIFFERENTIAL/PLATELET
Abs Immature Granulocytes: 0.01 K/uL (ref 0.00–0.07)
Basophils Absolute: 0 K/uL (ref 0.0–0.1)
Basophils Relative: 1 %
Eosinophils Absolute: 0.1 K/uL (ref 0.0–0.5)
Eosinophils Relative: 2 %
HCT: 36.4 % (ref 36.0–46.0)
Hemoglobin: 11.4 g/dL — ABNORMAL LOW (ref 12.0–15.0)
Immature Granulocytes: 0 %
Lymphocytes Relative: 33 %
Lymphs Abs: 2.2 K/uL (ref 0.7–4.0)
MCH: 30.4 pg (ref 26.0–34.0)
MCHC: 31.3 g/dL (ref 30.0–36.0)
MCV: 97.1 fL (ref 80.0–100.0)
Monocytes Absolute: 0.6 K/uL (ref 0.1–1.0)
Monocytes Relative: 9 %
Neutro Abs: 3.7 K/uL (ref 1.7–7.7)
Neutrophils Relative %: 55 %
Platelets: 274 K/uL (ref 150–400)
RBC: 3.75 MIL/uL — ABNORMAL LOW (ref 3.87–5.11)
RDW: 13.6 % (ref 11.5–15.5)
WBC: 6.7 K/uL (ref 4.0–10.5)
nRBC: 0 % (ref 0.0–0.2)

## 2023-10-29 LAB — BRAIN NATRIURETIC PEPTIDE: B Natriuretic Peptide: 16 pg/mL (ref 0.0–100.0)

## 2023-10-29 LAB — TROPONIN I (HIGH SENSITIVITY): Troponin I (High Sensitivity): 3 ng/L (ref <18)

## 2023-10-29 LAB — D-DIMER, QUANTITATIVE: D-Dimer, Quant: <0.27 ug{FEU}/mL (ref 0.00–0.50)

## 2023-10-29 MED ORDER — IPRATROPIUM-ALBUTEROL 0.5-2.5 (3) MG/3ML IN SOLN
3.0000 mL | Freq: Once | RESPIRATORY_TRACT | Status: AC
  Administered 2023-10-29: 3 mL via RESPIRATORY_TRACT

## 2023-10-29 MED ORDER — PREDNISONE 10 MG (21) PO TBPK: ORAL_TABLET | Freq: Every day | ORAL | 0 refills | Status: DC

## 2023-10-29 NOTE — Discharge Instructions (Signed)
 We are putting you on a course of steroids to see if this helps with your cough and wheezing.  However know that this will increase your blood sugar during this time and you need to keep a close monitoring of this.  If you develop fever, coughing up blood, trouble breathing, or any other new/concerning symptoms then return to the ER.

## 2023-10-29 NOTE — ED Notes (Signed)
 CCMD called and notified

## 2023-10-29 NOTE — ED Triage Notes (Signed)
 Pt came in via POV d/t a cough that has lingered & worsened over time, non-productive, no bloody sputum, no fevers, denies CP does have SOB that is worse with exertion.

## 2023-10-29 NOTE — ED Notes (Signed)
Walked patient to the bathroom patient did well 

## 2023-10-29 NOTE — ED Provider Notes (Signed)
 [place] EMERGENC[facility] DEPARTMENT AT [facility] Provider Note   CSN: [id number] Arrival date & time: 10/29/23  1513     Patient presents with: Cough and Shortness of Breath   [name] is a 68 y.o. female.   HPI 68 year old female with a history of asthma, diabetes, hypertension, and other comorbidities presents with cough.  She has been having a cough for 3+ weeks.  It is mostly nonproductive.  No hematemesis, fever.  Intermittently has shortness of breath.  The cough is worse at night and she has tried multiple different things including Tessalon , Mucinex and other over-the-counter cough meds, Hycodan.  She denies any chest pain.  She has chronic right leg swelling that is unchanged.  Cough is mostly at night but today was more prominent in the morning which prompted her to come to the ER.  She has used her inhaler without any significant relief for the cough.  Prior to Admission medications   Medication Sig Start Date End Date Taking? Authorizing Provider  predniSONE  (STERAPRED UNI-PAK 21 TAB) 10 MG (21) TBPK tablet Take by mouth daily. Take 6 tabs by mouth daily  for 2 days, then 5 tabs for 2 days, then 4 tabs for 2 days, then 3 tabs for 2 days, 2 tabs for 2 days, then 1 tab by mouth daily for 2 days 10/29/23  Yes Freddi Hamilton, MD  aspirin  EC 81 MG tablet Take 81 mg by mouth daily.    [provider]  B Complex-C (SUPER B COMPLEX PO) Take 1 tablet by mouth daily. Takes every other day    [provider]  benzonatate  (TESSALON ) 100 MG capsule Take 1 capsule (100 mg total) by mouth every 8 (eight) hours. 10/07/23   Ward, Harlene PEDLAR, PA-C  blood glucose meter kit and supplies KIT Dispense based on patient and insurance preference. Use up to four times daily as directed. Patient not taking: Reported on 10/11/2023 02/24/23   Cranford, Tonya, NP  cetirizine (ZYRTEC) 10 MG chewable tablet Chew 10 mg by mouth every other day.    [provider]   Cholecalciferol (VITAMIN D  PO) Take 2 capsules by mouth daily. Take 5000 units by mouth twice a day    [provider]  cyclobenzaprine  (FLEXERIL ) 10 MG tablet TAKE 1/2 TO 1 TABLET AT BEDTIME FOR MUSCLE SPASM Patient taking differently: Take 10 mg by mouth 3 (three) times daily as needed for muscle spasms. Take 1/2 to 1 tablet at Bedtime for Muscle Spasm 10/19/22   Wilkinson, Dana E, NP  docusate sodium (COLACE) 100 MG capsule Take 100 mg by mouth daily.    [provider]  DULoxetine  (CYMBALTA ) 30 MG capsule Take 1 capsule (30 mg total) by mouth daily. 08/29/23   Kayla Jeoffrey RAMAN, FNP  empagliflozin  (JARDIANCE ) 25 MG TABS tablet Take 1 tab prior to breakfast daily for diabetes. Patient not taking: Reported on 10/11/2023 06/12/21   Jeanine Knee, NP  ferrous sulfate 325 (65 FE) MG tablet Take by mouth.    [provider]  gabapentin  (NEURONTIN ) 600 MG tablet TAKE 1/2 TO 1 TABLET 2 TO 3 TIMES DAILY AS NEEDED FOR NEUROPATHY PAIN 07/05/23   Kayla Jeoffrey RAMAN, FNP  HYDROcodone  bit-homatropine (HYCODAN) 5-1.5 MG/5ML syrup Take 5 mLs by mouth every 8 (eight) hours as needed for cough. 10/11/23   Kayla Jeoffrey RAMAN, FNP  lisinopril -hydrochlorothiazide  (ZESTORETIC ) 10-12.5 MG tablet TAKE 1 TABLET DAILY FOR BLOOD PRESSURE AND DIABETIC KIDNEY PROTECTION 10/20/23   Kayla Jeoffrey  S, FNP  meloxicam  (MOBIC ) 15 MG tablet TAKE 1/2 TO 1 TABLET DAILY WITH FOOD FOR PAIN AND INFLAMMATION 10/20/23   Kayla Jeoffrey RAMAN, FNP  METAMUCIL FIBER PO Take 1 tablet by mouth daily.    [provider]  metFORMIN  (GLUCOPHAGE -XR) 500 MG 24 hr tablet TAKE 2 TABLETS TWICE A DAY WITH MEALS 08/30/23   Duanne Butler DASEN, MD  metoprolol  succinate (TOPROL -XL) 25 MG 24 hr tablet TAKE 1 TABLET EVERY DAY FOR BLOOD PRESSURE 09/07/23   Duanne Butler DASEN, MD  mometasone  (NASONEX ) 50 MCG/ACT nasal spray Place 2 sprays into the nose daily. Patient taking differently: Place 2 sprays into the nose as needed. 06/30/18 10/11/23   Tonita Fallow, MD  Multiple Vitamin (MULITIVITAMIN WITH MINERALS) TABS Take 1 tablet by mouth daily.    [provider]  pantoprazole  (PROTONIX ) 40 MG tablet TAKE 1 TABLET BY MOUTH ONCE DAILY TO  PREVENT  INDIGESTION  AND  HEARTBURN 07/05/23   Kayla Jeoffrey RAMAN, FNP  pioglitazone  (ACTOS ) 30 MG tablet Take 1 tablet (30 mg total) by mouth daily. 10/11/23   Kayla Jeoffrey RAMAN, FNP  potassium chloride  SA (KLOR-CON  M) 20 MEQ tablet Take 1 tablet 3 x /day with Meals for Potassium                                                                /                                           TAKE                                      BY                                       MOUTH 07/05/23   Howard, Amber S, FNP  rOPINIRole  (REQUIP ) 1 MG tablet Take  1 tablet 3 x / day as Needed for Restless Legs        /       TAKE      BY      MOUTH 07/05/23   Kayla Jeoffrey RAMAN, FNP  rosuvastatin  (CRESTOR ) 20 MG tablet TAKE 1 TABLET EVERY DAY FOR CHOLESTEROL 09/07/23   Duanne Butler DASEN, MD  RYBELSUS  3 MG TABS Take 1 tablet by mouth every day Patient not taking: Reported on 10/11/2023 08/31/23   Kayla Jeoffrey RAMAN, FNP  Turmeric (QC TUMERIC COMPLEX PO) Take 1 capsule by mouth daily.    [provider]  Zinc 30 MG CAPS Take 1 capsule by mouth daily.    [provider]    Allergies: Naprosyn [naproxen]    Review of Systems  Constitutional:  Negative for fever.  Respiratory:  Positive for cough and shortness of breath.   Cardiovascular:  Positive for leg swelling. Negative for chest pain.  Gastrointestinal:  Negative for abdominal pain.    Updated Vital Signs BP (!) 145/55   Pulse 95   Temp 98.1 F (  36.7 C) (Oral)   Resp 14   Ht 4' 10 (1.473 m)   Wt 59 kg   SpO2 100%   BMI 27.17 kg/m   Physical Exam Vitals and nursing note reviewed.  Constitutional:      Appearance: She is well-developed.  HENT:     Head: Normocephalic and atraumatic.  Cardiovascular:     Rate and Rhythm: Regular rhythm.  Tachycardia present.     Heart sounds: Normal heart sounds.     Comments: HR~100 Pulmonary:     Effort: Pulmonary effort is normal. No tachypnea or accessory muscle usage.     Breath sounds: Normal breath sounds. No decreased breath sounds.  Abdominal:     Palpations: Abdomen is soft.     Tenderness: There is no abdominal tenderness.  Musculoskeletal:     Right lower leg: No edema.     Left lower leg: No edema.  Skin:    General: Skin is warm and dry.  Neurological:     Mental Status: She is alert.     (all labs ordered are listed, but only abnormal results are displayed) Labs Reviewed  COMPREHENSIVE METABOLIC PANEL WITH GFR - Abnormal; Notable for the following components:      Result Value   Glucose, Bld 118 (*)    Calcium  8.8 (*)    All other components within normal limits  CBC WITH DIFFERENTIAL/PLATELET - Abnormal; Notable for the following components:   RBC 3.75 (*)    Hemoglobin 11.4 (*)    All other components within normal limits  BRAIN NATRIURETIC PEPTIDE  D-DIMER, QUANTITATIVE  TROPONIN I (HIGH SENSITIVITY)    EKG: EKG Interpretation Date/Time:  Saturday October 29 2023 16:31:30 EDT Ventricular Rate:  90 PR Interval:  152 QRS Duration:  76 QT Interval:  353 QTC Calculation: 432 R Axis:   67  Text Interpretation: Sinus rhythm Probable anteroseptal infarct, recent Baseline wander in lead(s) V2 V6 HR faster, otherwise ECG similar to May 2022 Confirmed by Freddi Hamilton [phone]) on 10/29/2023 4:34:38 PM  Radiology: [facility] Chest 2 View Result Date: 10/29/2023 CLINICAL DATA:  Cough. EXAM: CHEST - 2 VIEW COMPARISON:  Chest radiograph dated 10/07/2023. FINDINGS: No focal consolidation, pleural effusion, or pneumothorax. Stable cardiac silhouette. No acute osseous pathology. IMPRESSION: No active cardiopulmonary disease. Electronically Signed   By: Vanetta Chou M.D.   On: 10/29/2023 17:32     Procedures   Medications Ordered in the ED  ipratropium-albuterol   (DUONEB) 0.5-2.5 (3) MG/3ML nebulizer solution 3 mL (3 mLs Nebulization Given 10/29/23 1612)                                    Medical Decision Making Amount and/or Complexity of Data Reviewed Labs: ordered.    Details: Normal troponin and D-dimer Radiology: ordered and independent interpretation performed.    Details: No pneumonia ECG/medicine tests: ordered and independent interpretation performed.    Details: No ischemia  Risk Prescription drug management.   Patient was a little tachycardic on arrival.  Otherwise well-appearing.  She was given a breathing treatment with some partial and transient relief.  She reports she has been hearing some wheezing during these coughing episodes for the past couple weeks.  Workup here is unremarkable including no signs of pneumonia.  I do not think COVID/flu testing is necessary given the length of time of symptoms.  Otherwise, troponin and D-dimer and ECG are unremarkable compared to  baseline.  Low suspicion for PE.  While she does have diabetes, seems to be reasonably controlled at this time and I think it is reasonable to put her on steroids given her asthma history and complaints.  She has already completed a course of azithromycin .  Will discharge home with return precautions.     Final diagnoses:  Subacute cough    ED Discharge Orders          Ordered    predniSONE  (STERAPRED UNI-PAK 21 TAB) 10 MG (21) TBPK tablet  Daily        10/29/23 1928               Freddi Hamilton, MD 10/29/23 2102

## 2023-11-02 ENCOUNTER — Ambulatory Visit (INDEPENDENT_AMBULATORY_CARE_PROVIDER_SITE_OTHER): Admitting: Family Medicine

## 2023-11-02 ENCOUNTER — Encounter: Payer: Self-pay | Admitting: Family Medicine

## 2023-11-02 VITALS — BP 122/81 | HR 94 | Temp 98.4°F | Ht <= 58 in | Wt 129.2 lb

## 2023-11-02 DIAGNOSIS — J45909 Unspecified asthma, uncomplicated: Secondary | ICD-10-CM

## 2023-11-02 DIAGNOSIS — R052 Subacute cough: Secondary | ICD-10-CM | POA: Insufficient documentation

## 2023-11-02 DIAGNOSIS — E1122 Type 2 diabetes mellitus with diabetic chronic kidney disease: Secondary | ICD-10-CM | POA: Diagnosis not present

## 2023-11-02 DIAGNOSIS — N181 Chronic kidney disease, stage 1: Secondary | ICD-10-CM | POA: Diagnosis not present

## 2023-11-02 MED ORDER — LANCET DEVICE MISC
1.0000 | Freq: Three times a day (TID) | 0 refills | Status: AC
Start: 2023-11-02 — End: 2023-12-02

## 2023-11-02 MED ORDER — BLOOD GLUCOSE TEST VI STRP
1.0000 | ORAL_STRIP | Freq: Three times a day (TID) | 0 refills | Status: AC
Start: 2023-11-02 — End: 2023-12-02

## 2023-11-02 MED ORDER — LANCETS MISC. MISC
1.0000 | Freq: Three times a day (TID) | 0 refills | Status: AC
Start: 2023-11-02 — End: 2023-12-02

## 2023-11-02 MED ORDER — ALBUTEROL SULFATE HFA 108 (90 BASE) MCG/ACT IN AERS: 2.0000 | INHALATION_SPRAY | Freq: Four times a day (QID) | RESPIRATORY_TRACT | 0 refills | Status: DC | PRN

## 2023-11-02 NOTE — Assessment & Plan Note (Signed)
 Provided with BG monitoring kit today in office and advised to monitor. Recently started on Actos . Next A1c in November

## 2023-11-02 NOTE — Progress Notes (Signed)
 Subjective:  HPI: [name] is a 68 y.o. female presenting on 11/02/2023 for Acute Visit (ED f/u for  cough)   HPI Patient is in today for follow up for ED visit. She reports today her cough is much improved. She is finishing her prednisone . Is using an Albuterol  inhaler PRN, last used last week. Denies SOB, wheezing, ever, chills, body aches. She is on Lisinopril  so will consider switching this if cough persists. She reports the cough starts in her throat and does have severe GERD. Reports today symptoms are well controlled on Protonix .  She is not checking her BG. Does not have a meter.   ED visit 10/29/2023 for reference only: 68 year old female with a history of asthma, diabetes, hypertension, and other comorbidities presents with cough.  She has been having a cough for 3+ weeks.  It is mostly nonproductive.  No hematemesis, fever.  Intermittently has shortness of breath.  The cough is worse at night and she has tried multiple different things including Tessalon , Mucinex and other over-the-counter cough meds, Hycodan.  She denies any chest pain.  She has chronic right leg swelling that is unchanged.  Cough is mostly at night but today was more prominent in the morning which prompted her to come to the ER.  She has used her inhaler without any significant relief for the cough.   Patient was a little tachycardic on arrival. Otherwise well-appearing. She was given a breathing treatment with some partial and transient relief. She reports she has been hearing some wheezing during these coughing episodes for the past couple weeks. Workup here is unremarkable including no signs of pneumonia. I do not think COVID/flu testing is necessary given the length of time of symptoms. Otherwise, troponin and D-dimer and ECG are unremarkable compared to baseline. Low suspicion for PE. While she does have diabetes, seems to be reasonably controlled at this time and I think it is reasonable to put her on  steroids given her asthma history and complaints. She has already completed a course of azithromycin . Will discharge home with return precautions.   Review of Systems  All other systems reviewed and are negative.   Relevant past medical history reviewed and updated as indicated.   Past Medical History:  Diagnosis Date   Allergic rhinitis    ASTHMA    BICUSPID AORTIC VALVE    CEREBROVASCULAR DISEASE    DEGENERATIVE JOINT DISEASE    Diabetes mellitus    DJD (degenerative joint disease)    GERD    HSV-1 (herpes simplex virus 1) infection    HYPERLIPIDEMIA    Hypertension    Palpitation    Vitamin D  deficiency      Past Surgical History:  Procedure Laterality Date   ABDOMINAL HYSTERECTOMY     EYE SURGERY     repair of trigger finger      Allergies and medications reviewed and updated.   Current Outpatient Medications:    albuterol  (VENTOLIN  HFA) 108 (90 Base) MCG/ACT inhaler, Inhale 2 puffs into the lungs every 6 (six) hours as needed for wheezing or shortness of breath., Disp: 8.5 g, Rfl: 0   Glucose Blood (BLOOD GLUCOSE TEST STRIPS) STRP, 1 each by In Vitro route in the morning, at noon, and at bedtime. May substitute to any manufacturer covered by patient's insurance., Disp: 100 strip, Rfl: 0   Lancet Device MISC, 1 each by Does not apply route in the morning, at noon, and at bedtime. May substitute to any manufacturer covered  by patient's insurance., Disp: 1 each, Rfl: 0   Lancets Misc. MISC, 1 each by Does not apply route in the morning, at noon, and at bedtime. May substitute to any manufacturer covered by patient's insurance., Disp: 100 each, Rfl: 0   predniSONE  (DELTASONE ) 10 MG tablet, Take 10 mg by mouth 2 (two) times daily with a meal., Disp: , Rfl:    aspirin  EC 81 MG tablet, Take 81 mg by mouth daily., Disp: , Rfl:    B Complex-C (SUPER B COMPLEX PO), Take 1 tablet by mouth daily. Takes every other day, Disp: , Rfl:    benzonatate  (TESSALON ) 100 MG capsule, Take 1  capsule (100 mg total) by mouth every 8 (eight) hours., Disp: 21 capsule, Rfl: 0   blood glucose meter kit and supplies KIT, Dispense based on patient and insurance preference. Use up to four times daily as directed. (Patient not taking: Reported on 11/02/2023), Disp: 1 each, Rfl: 0   cetirizine (ZYRTEC) 10 MG chewable tablet, Chew 10 mg by mouth every other day., Disp: , Rfl:    Cholecalciferol (VITAMIN D  PO), Take 2 capsules by mouth daily. Take 5000 units by mouth twice a day, Disp: , Rfl:    cyclobenzaprine  (FLEXERIL ) 10 MG tablet, TAKE 1/2 TO 1 TABLET AT BEDTIME FOR MUSCLE SPASM (Patient taking differently: Take 10 mg by mouth 3 (three) times daily as needed for muscle spasms. Take 1/2 to 1 tablet at Bedtime for Muscle Spasm), Disp: 60 tablet, Rfl: 5   docusate sodium (COLACE) 100 MG capsule, Take 100 mg by mouth daily., Disp: , Rfl:    DULoxetine  (CYMBALTA ) 30 MG capsule, Take 1 capsule (30 mg total) by mouth daily., Disp: 30 capsule, Rfl: 3   empagliflozin  (JARDIANCE ) 25 MG TABS tablet, Take 1 tab prior to breakfast daily for diabetes. (Patient not taking: Reported on 11/02/2023), Disp: 90 tablet, Rfl: 3   ferrous sulfate 325 (65 FE) MG tablet, Take by mouth., Disp: , Rfl:    gabapentin  (NEURONTIN ) 600 MG tablet, TAKE 1/2 TO 1 TABLET 2 TO 3 TIMES DAILY AS NEEDED FOR NEUROPATHY PAIN, Disp: 270 tablet, Rfl: 3   HYDROcodone  bit-homatropine (HYCODAN) 5-1.5 MG/5ML syrup, Take 5 mLs by mouth every 8 (eight) hours as needed for cough., Disp: 60 mL, Rfl: 0   lisinopril -hydrochlorothiazide  (ZESTORETIC ) 10-12.5 MG tablet, TAKE 1 TABLET DAILY FOR BLOOD PRESSURE AND DIABETIC KIDNEY PROTECTION, Disp: 90 tablet, Rfl: 3   meloxicam  (MOBIC ) 15 MG tablet, TAKE 1/2 TO 1 TABLET DAILY WITH FOOD FOR PAIN AND INFLAMMATION, Disp: 90 tablet, Rfl: 3   METAMUCIL FIBER PO, Take 1 tablet by mouth daily., Disp: , Rfl:    metFORMIN  (GLUCOPHAGE -XR) 500 MG 24 hr tablet, TAKE 2 TABLETS TWICE A DAY WITH MEALS, Disp: 360 tablet,  Rfl: 3   metoprolol  succinate (TOPROL -XL) 25 MG 24 hr tablet, TAKE 1 TABLET EVERY DAY FOR BLOOD PRESSURE, Disp: 90 tablet, Rfl: 3   mometasone  (NASONEX ) 50 MCG/ACT nasal spray, Place 2 sprays into the nose daily. (Patient taking differently: Place 2 sprays into the nose as needed.), Disp: 51 g, Rfl: 3   Multiple Vitamin (MULITIVITAMIN WITH MINERALS) TABS, Take 1 tablet by mouth daily., Disp: , Rfl:    pantoprazole  (PROTONIX ) 40 MG tablet, TAKE 1 TABLET BY MOUTH ONCE DAILY TO  PREVENT  INDIGESTION  AND  HEARTBURN, Disp: 90 tablet, Rfl: 3   pioglitazone  (ACTOS ) 30 MG tablet, Take 1 tablet (30 mg total) by mouth daily., Disp: 90 tablet, Rfl: 1  potassium chloride  SA (KLOR-CON  M) 20 MEQ tablet, Take 1 tablet 3 x /day with Meals for Potassium                                                                /                                           TAKE                                      BY                                       MOUTH, Disp: 270 tablet, Rfl: 3   predniSONE  (STERAPRED UNI-PAK 21 TAB) 10 MG (21) TBPK tablet, Take by mouth daily. Take 6 tabs by mouth daily  for 2 days, then 5 tabs for 2 days, then 4 tabs for 2 days, then 3 tabs for 2 days, 2 tabs for 2 days, then 1 tab by mouth daily for 2 days, Disp: 42 tablet, Rfl: 0   rOPINIRole  (REQUIP ) 1 MG tablet, Take  1 tablet 3 x / day as Needed for Restless Legs        /       TAKE      BY      MOUTH, Disp: 270 tablet, Rfl: 3   rosuvastatin  (CRESTOR ) 20 MG tablet, TAKE 1 TABLET EVERY DAY FOR CHOLESTEROL, Disp: 90 tablet, Rfl: 3   Turmeric (QC TUMERIC COMPLEX PO), Take 1 capsule by mouth daily., Disp: , Rfl:    Zinc 30 MG CAPS, Take 1 capsule by mouth daily., Disp: , Rfl:   Allergies  Allergen Reactions   Naprosyn [Naproxen] Other (See Comments)    GI upset    Objective:   BP 122/81   Pulse 94   Temp 98.4 F (36.9 C)   Ht 4' 10 (1.473 m)   Wt 129 lb 3.2 oz (58.6 kg)   SpO2 97%   BMI 27.00 kg/m      11/02/2023    3:28 PM 10/29/2023     7:15 PM 10/29/2023    3:45 PM  Vitals with BMI  Height 4' 10    Weight 129 lbs 3 oz    BMI 27.01    Systolic 122 145 826  Diastolic 81 55 80  Pulse 94 95 94     Physical Exam Vitals and nursing note reviewed.  Constitutional:      Appearance: Normal appearance. She is normal weight.  HENT:     Head: Normocephalic and atraumatic.  Cardiovascular:     Rate and Rhythm: Normal rate and regular rhythm.     Pulses: Normal pulses.     Heart sounds: Normal heart sounds.  Pulmonary:     Effort: Pulmonary effort is normal.     Breath sounds: Normal breath sounds.  Skin:    General: Skin is warm and dry.  Neurological:     General:  No focal deficit present.     Mental Status: She is alert and oriented to person, place, and time. Mental status is at baseline.  Psychiatric:        Mood and Affect: Mood normal.        Behavior: Behavior normal.        Thought Content: Thought content normal.        Judgment: Judgment normal.     Assessment & Plan:  Subacute cough Assessment & Plan: Improving on prednisone . Complete course and monitor blood glucose. Follow up if symptoms persist or worsen.    Type 2 diabetes mellitus with stage 1 chronic kidney disease, without long-term current use of insulin  [facility]) Assessment & Plan: Provided with BG monitoring kit today in office and advised to monitor. Recently started on Actos . Next A1c in November   Uncomplicated asthma, unspecified asthma severity, unspecified whether persistent Assessment & Plan: Refill albuterol  provided, no exacerbations, SOB or wheezing   Other orders -     Albuterol  Sulfate HFA; Inhale 2 puffs into the lungs every 6 (six) hours as needed for wheezing or shortness of breath.  Dispense: 8.5 g; Refill: 0 -     Blood Glucose Test; 1 each by In Vitro route in the morning, at noon, and at bedtime. May substitute to any manufacturer covered by patient's insurance.  Dispense: 100 strip; Refill: 0 -     Lancet Device; 1  each by Does not apply route in the morning, at noon, and at bedtime. May substitute to any manufacturer covered by patient's insurance.  Dispense: 1 each; Refill: 0 -     Lancets Misc.; 1 each by Does not apply route in the morning, at noon, and at bedtime. May substitute to any manufacturer covered by patient's insurance.  Dispense: 100 each; Refill: 0     Follow up plan: Return as scheduled.  Jeoffrey GORMAN Barrio, FNP

## 2023-11-02 NOTE — Assessment & Plan Note (Signed)
 Improving on prednisone . Complete course and monitor blood glucose. Follow up if symptoms persist or worsen.

## 2023-11-02 NOTE — Assessment & Plan Note (Signed)
 Refill albuterol  provided, no exacerbations, SOB or wheezing

## 2023-11-03 ENCOUNTER — Other Ambulatory Visit: Payer: Self-pay | Admitting: Family Medicine

## 2023-11-03 NOTE — Telephone Encounter (Unsigned)
 Copied from CRM [id number]. Topic: Clinical - Medication Refill >> Nov 03, 2023  2:18 PM Tiffini S wrote: Medication: Glucose Blood (BLOOD GLUCOSE TEST STRIPS) STRP albuterol  (VENTOLIN  HFA) 108 (90 Base) MCG/ACT inhaler  Lancets Misc. MISC  Has the patient contacted their pharmacy? Yes (Agent: If no, request that the patient contact the pharmacy for the refill. If patient does not wish to contact the pharmacy document the reason why and proceed with request.) (Agent: If yes, when and what did the pharmacy advise?)  This is the patient's preferred pharmacy:  [facility]  [address], [place], [place] [address] [phone]  Is this the correct pharmacy for this prescription? Yes If no, delete pharmacy and type the correct one.   Has the prescription been filled recently? Yes  Is the patient out of the medication? Yes  Has the patient been seen for an appointment in the last year OR does the patient have an upcoming appointment? Yes  Can we respond through MyChart? No, please call [phone]  Agent: Please be advised that Rx refills may take up to 3 business days. We ask that you follow-up with your pharmacy.

## 2023-11-04 ENCOUNTER — Other Ambulatory Visit: Payer: Self-pay | Admitting: Family Medicine

## 2023-11-04 NOTE — Telephone Encounter (Signed)
 Prescription Request  11/04/2023  LOV: 11/02/2023  What is the name of the medication or equipment?   DULoxetine  (CYMBALTA ) 30 MG capsule   Have you contacted your pharmacy to request a refill? Yes   Which pharmacy would you like this sent to?  [facility] - [place], [place] - [address] [place] [address] Phone: [phone] Fax: [phone]    Patient notified that their request is being sent to the clinical staff for review and that they should receive a response within 2 business days.   Please advise pharmacist.

## 2023-11-04 NOTE — Telephone Encounter (Signed)
 Duplicate request, LRF 11/02/23  Requested Prescriptions  Pending Prescriptions Disp Refills   albuterol  (VENTOLIN  HFA) 108 (90 Base) MCG/ACT inhaler 8.5 g 0    Sig: Inhale 2 puffs into the lungs every 6 (six) hours as needed for wheezing or shortness of breath.     Pulmonology:  Beta Agonists 2 Passed - 11/04/2023 12:51 PM      Passed - Last BP in normal range    BP Readings from Last 1 Encounters:  11/02/23 122/81         Passed - Last Heart Rate in normal range    Pulse Readings from Last 1 Encounters:  11/02/23 94         Passed - Valid encounter within last 12 months    Recent Outpatient Visits           2 days ago Subacute cough   [facility] Kayla Jeoffrey RAMAN, FNP   3 weeks ago Type 2 diabetes mellitus with stage 1 chronic kidney disease, without long-term current use of insulin  [facility])   [place] [facility] Kayla Jeoffrey RAMAN, FNP   1 month ago Swelling of joint of right hand   [place] [facility] Kayla Jeoffrey RAMAN, FNP   5 months ago Physical exam, annual   [facility] [facility] Kayla Jeoffrey RAMAN, FNP       Future Appointments             In 3 months Therisa, Benton PARAS, NP [facility]             Lancets Misc. MISC 100 each 0    Sig: 1 each by Does not apply route in the morning, at noon, and at bedtime. May substitute to any manufacturer covered by patient's insurance.     Endocrinology: Diabetes - Testing Supplies Passed - 11/04/2023 12:51 PM      Passed - Valid encounter within last 12 months    Recent Outpatient Visits           2 days ago Subacute cough   [place] [facility] Kayla Jeoffrey RAMAN, FNP   3 weeks ago Type 2 diabetes mellitus with stage 1 chronic kidney disease, without long-term current use of insulin  [facility])   [place] [facility] Kayla Jeoffrey RAMAN, FNP   1 month ago Swelling of joint of  right hand   [place] [facility] Kayla Jeoffrey RAMAN, FNP   5 months ago Physical exam, annual   [place] [facility] Kayla Jeoffrey RAMAN, FNP       Future Appointments             In 3 months Therisa Benton PARAS, NP [place] [facility] [facility]             Glucose Blood (BLOOD GLUCOSE TEST STRIPS) STRP 100 strip 0    Sig: 1 each by In Vitro route in the morning, at noon, and at bedtime. May substitute to any manufacturer covered by patient's insurance.     Endocrinology: Diabetes - Testing Supplies Passed - 11/04/2023 12:51 PM      Passed - Valid encounter within last 12 months    Recent Outpatient Visits           2 days ago Subacute cough   [place] [facility] Kayla Jeoffrey RAMAN, FNP   3 weeks  ago Type 2 diabetes mellitus with stage 1 chronic kidney disease, without long-term current use of insulin  [facility])   [place] [facility] Kayla Jeoffrey RAMAN, FNP   1 month ago Swelling of joint of right hand   [place] [facility] Kayla Jeoffrey RAMAN, FNP   5 months ago Physical exam, annual   [place] [facility] Kayla Jeoffrey RAMAN, FNP       Future Appointments             In 3 months Therisa, Benton PARAS, NP [facility]

## 2023-11-04 NOTE — Telephone Encounter (Signed)
 Requested Prescriptions  Refused Prescriptions Disp Refills   DULoxetine  (CYMBALTA ) 30 MG capsule 30 capsule 3    Sig: Take 1 capsule (30 mg total) by mouth daily.     Psychiatry: Antidepressants - SNRI - duloxetine  Passed - 11/04/2023  4:40 PM      Passed - Cr in normal range and within 360 days    Creat  Date Value Ref Range Status  07/08/2023 0.48 (L) 0.50 - 1.05 mg/dL Final   Creatinine, Ser  Date Value Ref Range Status  10/29/2023 0.44 0.44 - 1.00 mg/dL Final   Creatinine, Urine  Date Value Ref Range Status  07/08/2023 68 20 - 275 mg/dL Final         Passed - eGFR is 30 or above and within 360 days    GFR, Est African American  Date Value Ref Range Status  07/29/2020 120 > OR = 60 mL/min/1.41m2 Final   GFR, Est Non African American  Date Value Ref Range Status  07/29/2020 104 > OR = 60 mL/min/1.57m2 Final   GFR, Estimated  Date Value Ref Range Status  10/29/2023 >60 >60 mL/min Final    Comment:    (NOTE) Calculated using the CKD-EPI Creatinine Equation (2021)    GFR  Date Value Ref Range Status  05/05/2012 121.03 >60.00 mL/min Final   eGFR  Date Value Ref Range Status  07/08/2023 104 > OR = 60 mL/min/1.77m2 Final         Passed - Completed PHQ-2 or PHQ-9 in the last 360 days      Passed - Last BP in normal range    BP Readings from Last 1 Encounters:  11/02/23 122/81         Passed - Valid encounter within last 6 months    Recent Outpatient Visits           2 days ago Subacute cough   [place] [facility] Kayla Jeoffrey RAMAN, FNP   3 weeks ago Type 2 diabetes mellitus with stage 1 chronic kidney disease, without long-term current use of insulin  [facility])   [place] [facility] Kayla Jeoffrey RAMAN, FNP   1 month ago Swelling of joint of right hand   [place] [facility] Kayla Jeoffrey RAMAN, FNP   5 months ago Physical exam, annual    [facility] Kayla Jeoffrey RAMAN, FNP        Future Appointments             In 3 months Therisa, Benton PARAS, NP [facility]

## 2023-11-08 ENCOUNTER — Other Ambulatory Visit: Payer: Self-pay | Admitting: Family Medicine

## 2023-11-08 NOTE — Telephone Encounter (Unsigned)
 Copied from CRM #[id number]. Topic: Clinical - Medication Refill >> Nov 08, 2023  9:37 AM Donna BRAVO wrote: Medication: DULoxetine  (CYMBALTA ) 30 MG capsule   Has the patient contacted their pharmacy? Yes Pharmacy sent in request on  09/21/23 -  09/29/23 - 8/29 - 9/13 -  9/16/  - 9/18  -  9/20 -  9/22   This is the patient's preferred pharmacy:  [facility] [place], [place] - [address] [place] [address] Phone: [phone] Fax: [phone]  Is this the correct pharmacy for this prescription? Yes If no, delete pharmacy and type the correct one.   Has the prescription been filled recently? Yes  Is the patient out of the medication? No  Has the patient been seen for an appointment in the last year OR does the patient have an upcoming appointment? Yes  Can we respond through MyChart? No  Agent: Please be advised that Rx refills may take up to 3 business days. We ask that you follow-up with your pharmacy.

## 2023-11-08 NOTE — Telephone Encounter (Signed)
 This RN spoke to the pharmacy, states requires refill for Oct 25th meds. States that last refill was mailed to the patient on Sept 25th.

## 2023-11-09 MED ORDER — DULOXETINE HCL 30 MG PO CPEP: 30.0000 mg | ORAL_CAPSULE | Freq: Every day | ORAL | 0 refills | Status: DC

## 2023-11-09 NOTE — Telephone Encounter (Signed)
 Requested Prescriptions  Pending Prescriptions Disp Refills   DULoxetine  (CYMBALTA ) 30 MG capsule 90 capsule 0    Sig: Take 1 capsule (30 mg total) by mouth daily.     Psychiatry: Antidepressants - SNRI - duloxetine  Passed - 11/09/2023  9:47 AM      Passed - Cr in normal range and within 360 days    Creat  Date Value Ref Range Status  07/08/2023 0.48 (L) 0.50 - 1.05 mg/dL Final   Creatinine, Ser  Date Value Ref Range Status  10/29/2023 0.44 0.44 - 1.00 mg/dL Final   Creatinine, Urine  Date Value Ref Range Status  07/08/2023 68 20 - 275 mg/dL Final         Passed - eGFR is 30 or above and within 360 days    GFR, Est African American  Date Value Ref Range Status  07/29/2020 120 > OR = 60 mL/min/1.1m2 Final   GFR, Est Non African American  Date Value Ref Range Status  07/29/2020 104 > OR = 60 mL/min/1.75m2 Final   GFR, Estimated  Date Value Ref Range Status  10/29/2023 >60 >60 mL/min Final    Comment:    (NOTE) Calculated using the CKD-EPI Creatinine Equation (2021)    GFR  Date Value Ref Range Status  05/05/2012 121.03 >60.00 mL/min Final   eGFR  Date Value Ref Range Status  07/08/2023 104 > OR = 60 mL/min/1.69m2 Final         Passed - Completed PHQ-2 or PHQ-9 in the last 360 days      Passed - Last BP in normal range    BP Readings from Last 1 Encounters:  11/02/23 122/81         Passed - Valid encounter within last 6 months    Recent Outpatient Visits           1 week ago Subacute cough   [place] [facility] Kayla Jeoffrey RAMAN, FNP   4 weeks ago Type 2 diabetes mellitus with stage 1 chronic kidney disease, without long-term current use of insulin  [facility])   [place] [facility] Kayla Jeoffrey RAMAN, FNP   1 month ago Swelling of joint of right hand   [place] [facility] Kayla Jeoffrey RAMAN, FNP   5 months ago Physical exam, annual   [place] [facility] Kayla Jeoffrey RAMAN, FNP        Future Appointments             In 3 months Therisa, Benton PARAS, NP [facility]

## 2023-11-16 ENCOUNTER — Ambulatory Visit: Payer: Self-pay

## 2023-11-16 NOTE — Telephone Encounter (Signed)
 FYI Only or Action Required?: FYI only for provider.  Patient was last seen in primary care on 11/02/2023 by Kayla Jeoffrey RAMAN, FNP.  Called Nurse Triage reporting Cough.  Symptoms began several weeks ago.  Interventions attempted: Prescription medications: prednisone .  Symptoms are: gradually worsening.  Triage Disposition: See PCP When Office is Open (Within 3 Days)  Patient/caregiver understands and will follow disposition?: Yes    Copied from CRM [id number]. Topic: Clinical - Red Word Triage >> Nov 16, 2023  4:06 PM Jasmin G wrote: Red Word that prompted transfer to Nurse Triage: Pt states that she has been dealing with a cold for a while and has been to different ER's, was given prednisone  to treat but it has not helped, cough has gotten worse now non stop. Reason for Disposition  Cough has been present for > 3 weeks  Answer Assessment - Initial Assessment Questions Went to the ER and was given prednisone . Cough is worse. Now it's during the day and night.   1. ONSET: When did the cough begin?       About a month ago 2. SEVERITY: How bad is the cough today?      Standing up and moving up doesn't cough, laying down increases cough 3. SPUTUM: Describe the color of your sputum (e.g., none, dry cough; clear, white, yellow, green)     dry 4. HEMOPTYSIS: Are you coughing up any blood? If Yes, ask: How much? (e.g., flecks, streaks, tablespoons, etc.)     no 5. DIFFICULTY BREATHING: Are you having difficulty breathing? If Yes, ask: How bad is it? (e.g., mild, moderate, severe)      no 6. FEVER: Do you have a fever? If Yes, ask: What is your temperature, how was it measured, and when did it start?     Sweating, but hasn't checked temperature, sweating with movement 7. CARDIAC HISTORY: Do you have any history of heart disease? (e.g., heart attack, congestive heart failure)      no 8. LUNG HISTORY: Do you have any history of lung disease?  (e.g., pulmonary embolus,  asthma, emphysema)     asthma 9. PE RISK FACTORS: Do you have a history of blood clots? (or: recent major surgery, recent prolonged travel, bedridden)     No  10. OTHER SYMPTOMS: Do you have any other symptoms? (e.g., runny nose, wheezing, chest pain)       Coughing at night nose runs a little bit.  Protocols used: Cough - Acute Non-Productive-A-AH

## 2023-11-17 ENCOUNTER — Encounter: Payer: Self-pay | Admitting: Family Medicine

## 2023-11-17 ENCOUNTER — Ambulatory Visit (INDEPENDENT_AMBULATORY_CARE_PROVIDER_SITE_OTHER): Admitting: Family Medicine

## 2023-11-17 VITALS — BP 134/62 | HR 113 | Temp 97.6°F | Ht <= 58 in | Wt 124.4 lb

## 2023-11-17 DIAGNOSIS — K219 Gastro-esophageal reflux disease without esophagitis: Secondary | ICD-10-CM

## 2023-11-17 MED ORDER — FAMOTIDINE 20 MG PO TABS: 20.0000 mg | ORAL_TABLET | Freq: Every day | ORAL | 1 refills | Status: DC

## 2023-11-17 NOTE — Assessment & Plan Note (Signed)
 I believe pt is experiencing GERD flare up given her night time cough and globus sensation. 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 She did not cough once while in my office. No pulmonary or cardiac symptoms on ROS or exam. Will start Pepcid  20mg  daily and advised to avoid triggers. The pathophysiology of reflux is discussed.  Anti-reflux measures such as raising the head of the bed, avoiding tight clothing or belts, avoiding eating late at night and not lying down shortly after mealtime and achieving weight loss are discussed. Avoid ASA, NSAID's, caffeine, peppermints, alcohol and tobacco. She should alert me if there are persistent symptoms, dysphagia, weight loss or GI bleeding. Follow up if symptoms worse or do not improve in 1 week.

## 2023-11-17 NOTE — Progress Notes (Signed)
 Subjective:  HPI: [name] is a 68 y.o. female presenting on 11/17/2023 for Acute Visit (Persistent cough that is worsening causing lack of sleep /Stool color has been yellow for more than 2 weeks and she is concerned )   HPI Patient is in today for dry cough for 2 weeks. She is coughing more at night. Also endorses diaphoresis. Denies fever, chills, body aches, SOB, wheezing, congestion, swelling in extremities. He nose does run when she coughs a lot. She also feels what feels like sandpaper in her throat on the right side, like there is something there.  Has tried prednisone , has 1 day left, has seen some improvement. Has also tried Nyquil, benadryl, cough syrup, benzonatate . She was seen in the ED for same on 9/13. CXR was clear, negative d-dimer, BNP, EKG. Symptoms are unchanged. Has not had severe reflux lately while she has been on pantoprazole  2x daily. She does eat large meals late at night. She works 12-2 and eats then goes straight to bed. She does not eat spicy foods. Does drink caffeine.   Review of Systems  All other systems reviewed and are negative.   Relevant past medical history reviewed and updated as indicated.   Past Medical History:  Diagnosis Date   Allergic rhinitis    ASTHMA    BICUSPID AORTIC VALVE    CEREBROVASCULAR DISEASE    DEGENERATIVE JOINT DISEASE    Diabetes mellitus    DJD (degenerative joint disease)    GERD    HSV-1 (herpes simplex virus 1) infection    HYPERLIPIDEMIA    Hypertension    Palpitation    Vitamin D  deficiency      Past Surgical History:  Procedure Laterality Date   ABDOMINAL HYSTERECTOMY     EYE SURGERY     repair of trigger finger      Allergies and medications reviewed and updated.   Current Outpatient Medications:    albuterol  (VENTOLIN  HFA) 108 (90 Base) MCG/ACT inhaler, Inhale 2 puffs into the lungs every 6 (six) hours as needed for wheezing or shortness of breath., Disp: 8.5 g, Rfl: 0   aspirin  EC  81 MG tablet, Take 81 mg by mouth daily., Disp: , Rfl:    B Complex-C (SUPER B COMPLEX PO), Take 1 tablet by mouth daily. Takes every other day, Disp: , Rfl:    benzonatate  (TESSALON ) 100 MG capsule, Take 1 capsule (100 mg total) by mouth every 8 (eight) hours., Disp: 21 capsule, Rfl: 0   blood glucose meter kit and supplies KIT, Dispense based on patient and insurance preference. Use up to four times daily as directed., Disp: 1 each, Rfl: 0   cetirizine (ZYRTEC) 10 MG chewable tablet, Chew 10 mg by mouth every other day., Disp: , Rfl:    Cholecalciferol (VITAMIN D  PO), Take 2 capsules by mouth daily. Take 5000 units by mouth twice a day, Disp: , Rfl:    cyclobenzaprine  (FLEXERIL ) 10 MG tablet, TAKE 1/2 TO 1 TABLET AT BEDTIME FOR MUSCLE SPASM (Patient taking differently: Take 10 mg by mouth 3 (three) times daily as needed for muscle spasms. Take 1/2 to 1 tablet at Bedtime for Muscle Spasm), Disp: 60 tablet, Rfl: 5   docusate sodium (COLACE) 100 MG capsule, Take 100 mg by mouth daily., Disp: , Rfl:    DULoxetine  (CYMBALTA ) 30 MG capsule, Take 1 capsule (30 mg total) by mouth daily., Disp: 90 capsule, Rfl: 0   empagliflozin  (JARDIANCE ) 25 MG TABS tablet, Take 1 tab  prior to breakfast daily for diabetes., Disp: 90 tablet, Rfl: 3   famotidine  (PEPCID ) 20 MG tablet, Take 1 tablet (20 mg total) by mouth daily., Disp: 90 tablet, Rfl: 1   ferrous sulfate 325 (65 FE) MG tablet, Take by mouth., Disp: , Rfl:    gabapentin  (NEURONTIN ) 600 MG tablet, TAKE 1/2 TO 1 TABLET 2 TO 3 TIMES DAILY AS NEEDED FOR NEUROPATHY PAIN, Disp: 270 tablet, Rfl: 3   Glucose Blood (BLOOD GLUCOSE TEST STRIPS) STRP, 1 each by In Vitro route in the morning, at noon, and at bedtime. May substitute to any manufacturer covered by patient's insurance., Disp: 100 strip, Rfl: 0   HYDROcodone  bit-homatropine (HYCODAN) 5-1.5 MG/5ML syrup, Take 5 mLs by mouth every 8 (eight) hours as needed for cough., Disp: 60 mL, Rfl: 0   Lancet Device MISC, 1  each by Does not apply route in the morning, at noon, and at bedtime. May substitute to any manufacturer covered by patient's insurance., Disp: 1 each, Rfl: 0   Lancets Misc. MISC, 1 each by Does not apply route in the morning, at noon, and at bedtime. May substitute to any manufacturer covered by patient's insurance., Disp: 100 each, Rfl: 0   lisinopril -hydrochlorothiazide  (ZESTORETIC ) 10-12.5 MG tablet, TAKE 1 TABLET DAILY FOR BLOOD PRESSURE AND DIABETIC KIDNEY PROTECTION, Disp: 90 tablet, Rfl: 3   meloxicam  (MOBIC ) 15 MG tablet, TAKE 1/2 TO 1 TABLET DAILY WITH FOOD FOR PAIN AND INFLAMMATION, Disp: 90 tablet, Rfl: 3   METAMUCIL FIBER PO, Take 1 tablet by mouth daily., Disp: , Rfl:    metFORMIN  (GLUCOPHAGE -XR) 500 MG 24 hr tablet, TAKE 2 TABLETS TWICE A DAY WITH MEALS, Disp: 360 tablet, Rfl: 3   metoprolol  succinate (TOPROL -XL) 25 MG 24 hr tablet, TAKE 1 TABLET EVERY DAY FOR BLOOD PRESSURE, Disp: 90 tablet, Rfl: 3   mometasone  (NASONEX ) 50 MCG/ACT nasal spray, Place 2 sprays into the nose daily. (Patient taking differently: Place 2 sprays into the nose as needed.), Disp: 51 g, Rfl: 3   Multiple Vitamin (MULITIVITAMIN WITH MINERALS) TABS, Take 1 tablet by mouth daily., Disp: , Rfl:    pantoprazole  (PROTONIX ) 40 MG tablet, TAKE 1 TABLET BY MOUTH ONCE DAILY TO  PREVENT  INDIGESTION  AND  HEARTBURN, Disp: 90 tablet, Rfl: 3   pioglitazone  (ACTOS ) 30 MG tablet, Take 1 tablet (30 mg total) by mouth daily., Disp: 90 tablet, Rfl: 1   potassium chloride  SA (KLOR-CON  M) 20 MEQ tablet, Take 1 tablet 3 x /day with Meals for Potassium                                                                /                                           TAKE                                      BY  MOUTH, Disp: 270 tablet, Rfl: 3   predniSONE  (DELTASONE ) 10 MG tablet, Take 10 mg by mouth 2 (two) times daily with a meal., Disp: , Rfl:    predniSONE  (STERAPRED UNI-PAK 21 TAB) 10 MG (21) TBPK  tablet, Take by mouth daily. Take 6 tabs by mouth daily  for 2 days, then 5 tabs for 2 days, then 4 tabs for 2 days, then 3 tabs for 2 days, 2 tabs for 2 days, then 1 tab by mouth daily for 2 days, Disp: 42 tablet, Rfl: 0   rOPINIRole  (REQUIP ) 1 MG tablet, Take  1 tablet 3 x / day as Needed for Restless Legs        /       TAKE      BY      MOUTH, Disp: 270 tablet, Rfl: 3   rosuvastatin  (CRESTOR ) 20 MG tablet, TAKE 1 TABLET EVERY DAY FOR CHOLESTEROL, Disp: 90 tablet, Rfl: 3   Turmeric (QC TUMERIC COMPLEX PO), Take 1 capsule by mouth daily., Disp: , Rfl:    Zinc 30 MG CAPS, Take 1 capsule by mouth daily., Disp: , Rfl:   Allergies  Allergen Reactions   Naprosyn [Naproxen] Other (See Comments)    GI upset    Objective:   BP 134/62   Pulse (!) 113   Temp 97.6 F (36.4 C)   Ht 4' 10 (1.473 m)   Wt 124 lb 6.4 oz (56.4 kg)   SpO2 97%   BMI 26.00 kg/m      11/17/2023    3:12 PM 11/02/2023    3:28 PM 10/29/2023    7:15 PM  Vitals with BMI  Height 4' 10 4' 10   Weight 124 lbs 6 oz 129 lbs 3 oz   BMI 26.01 27.01   Systolic 134 122 854  Diastolic 62 81 55  Pulse 113 94 95     Physical Exam Vitals and nursing note reviewed.  Constitutional:      Appearance: Normal appearance. She is normal weight.  HENT:     Head: Normocephalic and atraumatic.     Right Ear: Tympanic membrane, ear canal and external ear normal.     Left Ear: Tympanic membrane, ear canal and external ear normal.     Nose: Nose normal.     Mouth/Throat:     Mouth: Mucous membranes are moist.     Pharynx: Oropharynx is clear.  Eyes:     Extraocular Movements: Extraocular movements intact.     Conjunctiva/sclera: Conjunctivae normal.     Pupils: Pupils are equal, round, and reactive to light.  Cardiovascular:     Rate and Rhythm: Normal rate and regular rhythm.     Pulses: Normal pulses.     Heart sounds: Normal heart sounds.  Pulmonary:     Effort: Pulmonary effort is normal.     Breath sounds: Normal  breath sounds.  Musculoskeletal:     Cervical back: No tenderness.  Lymphadenopathy:     Cervical: No cervical adenopathy.  Skin:    General: Skin is warm and dry.  Neurological:     General: No focal deficit present.     Mental Status: She is alert and oriented to person, place, and time. Mental status is at baseline.  Psychiatric:        Mood and Affect: Mood normal.        Behavior: Behavior normal.        Thought Content: Thought content normal.  Judgment: Judgment normal.     Assessment & Plan:  Gastroesophageal reflux disease without esophagitis Assessment & Plan: I believe pt is experiencing GERD flare up given her night time cough and globus sensation. 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 She did not cough once while in my office. No pulmonary or cardiac symptoms on ROS or exam. Will start Pepcid  20mg  daily and advised to avoid triggers. The pathophysiology of reflux is discussed.  Anti-reflux measures such as raising the head of the bed, avoiding tight clothing or belts, avoiding eating late at night and not lying down shortly after mealtime and achieving weight loss are discussed. Avoid ASA, NSAID's, caffeine, peppermints, alcohol and tobacco. She should alert me if there are persistent symptoms, dysphagia, weight loss or GI bleeding. Follow up if symptoms worse or do not improve in 1 week.    Other orders -     Famotidine ; Take 1 tablet (20 mg total) by mouth daily.  Dispense: 90 tablet; Refill: 1     Follow up plan: Return if symptoms worsen or fail to improve.  Jeoffrey GORMAN Barrio, FNP

## 2023-11-28 ENCOUNTER — Ambulatory Visit: Payer: Self-pay

## 2023-11-28 ENCOUNTER — Telehealth: Payer: Self-pay

## 2023-11-28 ENCOUNTER — Other Ambulatory Visit: Payer: Self-pay | Admitting: Family Medicine

## 2023-11-28 DIAGNOSIS — K219 Gastro-esophageal reflux disease without esophagitis: Secondary | ICD-10-CM

## 2023-11-28 NOTE — Telephone Encounter (Signed)
  FYI Only or Action Required?: Action required by provider: update on patient condition.  Patient was last seen in primary care on 11/17/2023 by Kayla Jeoffrey RAMAN, FNP.  Called Nurse Triage reporting Cough and Gastroesophageal Reflux.  Symptoms began several weeks ago.  Interventions attempted: Nothing.  Symptoms are: gradually worsening.  Triage Disposition: Information or Advice Only Call  Patient/caregiver understands and will follow disposition?: Yes Copied from CRM [id number]. Topic: Clinical - Red Word Triage >> Nov 28, 2023 10:31 AM Rosaria BRAVO wrote: Red Word that prompted transfer to Nurse Triage: worsening cough Reason for Disposition  Health information question, no triage required and triager able to answer question  Answer Assessment - Initial Assessment Questions 1. REASON FOR CALL: What is the main reason for your call? or How can I best help you?     Cough/reflux symptom   Patient has not started famotidine  prescribed 10/2 stating that it was not at the pharmacy. 2. SYMPTOMS : Do you have any symptoms?      Patient's reflux cough worsening and now coughing during the  day and night 3. OTHER QUESTIONS: Do you have any other questions?     N/a  Protocols used: Information Only Call - No Triage-A-AH

## 2023-11-28 NOTE — Telephone Encounter (Signed)
 Copied from CRM [id number]. Topic: Referral - Request for Referral >> Nov 28, 2023 10:29 AM Rosaria BRAVO wrote: Did the patient discuss referral with their provider in the last year? Yes (If No - schedule appointment) (If Yes - send message)  Appointment offered? No, recently seen for cough. Was advised to call back if symptoms worsened.   Type of order/referral and detailed reason for visit: ENT   Preference of office, provider, location: Highest recommended  If referral order, have you been seen by this specialty before? No (If Yes, this issue or another issue? When? Where?  Can we respond through MyChart? No

## 2023-11-30 NOTE — Telephone Encounter (Signed)
 First attempt. Pt not available. Will call back.

## 2023-12-01 NOTE — Telephone Encounter (Signed)
 Pt. Has been informed to look out for scheduling call from GI.

## 2023-12-07 ENCOUNTER — Ambulatory Visit: Payer: Self-pay

## 2023-12-07 ENCOUNTER — Other Ambulatory Visit: Payer: Self-pay | Admitting: Family Medicine

## 2023-12-07 NOTE — Telephone Encounter (Signed)
 FYI Only or Action Required?: FYI only for provider.  Patient was last seen in primary care on 11/17/2023 by Kayla Jeoffrey RAMAN, FNP.  Called Nurse Triage reporting Cough.  Symptoms began several months ago.  Interventions attempted: Prescription medications: pepcid .  Symptoms are: gradually worsening.  Triage Disposition: See PCP When Office is Open (Within 3 Days)  Patient/caregiver understands and will follow disposition?: Yes      Copied from CRM #[id number]. Topic: Clinical - Red Word Triage >> Dec 07, 2023  3:44 PM Donna BRAVO wrote: Red Word that prompted transfer to Nurse Triage:  patient calling Symptoms: Cough is getting worse When coughing something comes up, but it gets stuck in the back of throat Feels like tongue is swollen at the back of her throat  Stool is yellow for the past few months Reason for Disposition  Cough has been present for > 3 weeks  Answer Assessment - Initial Assessment Questions Feels like tongue feels swollen when the cough started When mouth is closed feel like something back there      1. ONSET: When did the swelling start? (e.g., minutes, hours, days)    Since the cough 2. LOCATION: What part of the tongue is swollen?     Feels like to the back 3. SEVERITY: How swollen is it?     unknown 4. CAUSE: What do you think is causing the tongue swelling? (e.g., history of angioedema, allergies)     unknown 5. RECURRENT SYMPTOM: Have you had tongue swelling before? If Yes, ask: When was the last time? What happened that time?     no 6. OTHER SYMPTOMS: Do you have any other symptoms? (e.g., breathing difficulty, facial swelling)     No difficulty breathing or swallowing, goes along with her cough.  Answer Assessment - Initial Assessment Questions PT states this has been on going. She has had a cough. At last appt pt was prescribed pepcid . She has been taking it. They stated if her symptoms didn't improve  in 1 week to be  re-evaluated.   1. ONSET: When did the cough begin?      On going but getting worse 2. SEVERITY: How bad is the cough today?      Slightly worse 3. SPUTUM: Describe the color of your sputum (e.g., none, dry cough; clear, white, yellow, green)     Unable to cough anything up 4. HEMOPTYSIS: Are you coughing up any blood? If Yes, ask: How much? (e.g., flecks, streaks, tablespoons, etc.)     no 5. DIFFICULTY BREATHING: Are you having difficulty breathing? If Yes, ask: How bad is it? (e.g., mild, moderate, severe)      no 6. FEVER: Do you have a fever? If Yes, ask: What is your temperature, how was it measured, and when did it start?     no  10. OTHER SYMPTOMS: Do you have any other symptoms? (e.g., runny nose, wheezing, chest pain)       Feels like tongue is swelling in the back. Feels like she needs to cough stuff up but can't. No chest pain, no shortness of breath, no difficulty swallowing.  Protocols used: Tongue Swelling-A-AH, Cough - Acute Non-Productive-A-AH

## 2023-12-08 ENCOUNTER — Telehealth: Payer: Self-pay | Admitting: Family Medicine

## 2023-12-08 NOTE — Telephone Encounter (Signed)
 Copied from CRM [id number]. Topic: Referral - Status >> Dec 07, 2023  3:39 PM Donna BRAVO wrote: Reason for CRM: patient asking about referral for ENT. Informed patient that the referral is for GI

## 2023-12-09 ENCOUNTER — Ambulatory Visit (INDEPENDENT_AMBULATORY_CARE_PROVIDER_SITE_OTHER): Admitting: Family Medicine

## 2023-12-09 VITALS — BP 132/64 | HR 97 | Temp 98.2°F | Ht <= 58 in | Wt 125.2 lb

## 2023-12-09 DIAGNOSIS — R053 Chronic cough: Secondary | ICD-10-CM

## 2023-12-09 MED ORDER — HYDROCHLOROTHIAZIDE 25 MG PO TABS: 25.0000 mg | ORAL_TABLET | Freq: Every day | ORAL | 3 refills | Status: DC

## 2023-12-09 NOTE — Progress Notes (Signed)
 Subjective:    Patient ID: [name], female    DOB: [birth year], 68 y.o.   MRN: [id number]  Cough   Patient states that she has had a nonproductive cough for almost 2 months.  She has had 2 different chest x-rays that showed no acute abnormalities.  She denies any fevers or chills.  She denies any wheezing.  She denies any shortness of breath.  She does have severe acid reflux which she has tried elevating the head of her bed and she is on 2 different medications for acid reflux.  She is not taking allergy medicine daily but she denies any rhinorrhea or head congestion or postnasal drip.  She describes it as a tickle like sensation in back of her throat over the last 2 months.  She denies any hemoptysis.  She denies any weight loss or pleurisy or night sweats Past Medical History:  Diagnosis Date   Allergic rhinitis    ASTHMA    BICUSPID AORTIC VALVE    CEREBROVASCULAR DISEASE    DEGENERATIVE JOINT DISEASE    Diabetes mellitus    DJD (degenerative joint disease)    GERD    HSV-1 (herpes simplex virus 1) infection    HYPERLIPIDEMIA    Hypertension    Palpitation    Vitamin D  deficiency    Past Surgical History:  Procedure Laterality Date   ABDOMINAL HYSTERECTOMY     EYE SURGERY     repair of trigger finger     Current Outpatient Medications on File Prior to Visit  Medication Sig Dispense Refill   albuterol  (VENTOLIN  HFA) 108 (90 Base) MCG/ACT inhaler Inhale 2 puffs into the lungs every 6 hours as needed for wheezing /short of breath 8.5 each 1   aspirin  EC 81 MG tablet Take 81 mg by mouth daily.     B Complex-C (SUPER B COMPLEX PO) Take 1 tablet by mouth daily. Takes every other day     benzonatate  (TESSALON ) 100 MG capsule Take 1 capsule (100 mg total) by mouth every 8 (eight) hours. 21 capsule 0   blood glucose meter kit and supplies KIT Dispense based on patient and insurance preference. Use up to four times daily as directed. 1 each 0   cetirizine (ZYRTEC) 10 MG  chewable tablet Chew 10 mg by mouth every other day.     Cholecalciferol (VITAMIN D  PO) Take 2 capsules by mouth daily. Take 5000 units by mouth twice a day     cyclobenzaprine  (FLEXERIL ) 10 MG tablet TAKE 1/2 TO 1 TABLET AT BEDTIME FOR MUSCLE SPASM 60 tablet 5   docusate sodium (COLACE) 100 MG capsule Take 100 mg by mouth daily.     DULoxetine  (CYMBALTA ) 30 MG capsule Take 1 capsule (30 mg total) by mouth daily. 90 capsule 0   empagliflozin  (JARDIANCE ) 25 MG TABS tablet Take 1 tab prior to breakfast daily for diabetes. 90 tablet 3   famotidine  (PEPCID ) 20 MG tablet Take 1 tablet (20 mg total) by mouth daily. 90 tablet 1   ferrous sulfate 325 (65 FE) MG tablet Take by mouth.     gabapentin  (NEURONTIN ) 600 MG tablet TAKE 1/2 TO 1 TABLET 2 TO 3 TIMES DAILY AS NEEDED FOR NEUROPATHY PAIN 270 tablet 3   HYDROcodone  bit-homatropine (HYCODAN) 5-1.5 MG/5ML syrup Take 5 mLs by mouth every 8 (eight) hours as needed for cough. 60 mL 0   meloxicam  (MOBIC ) 15 MG tablet TAKE 1/2 TO 1 TABLET DAILY WITH FOOD FOR PAIN AND  INFLAMMATION 90 tablet 3   METAMUCIL FIBER PO Take 1 tablet by mouth daily.     metFORMIN  (GLUCOPHAGE -XR) 500 MG 24 hr tablet TAKE 2 TABLETS TWICE A DAY WITH MEALS 360 tablet 3   metoprolol  succinate (TOPROL -XL) 25 MG 24 hr tablet TAKE 1 TABLET EVERY DAY FOR BLOOD PRESSURE 90 tablet 3   mometasone  (NASONEX ) 50 MCG/ACT nasal spray Place 2 sprays into the nose daily. 51 g 3   Multiple Vitamin (MULITIVITAMIN WITH MINERALS) TABS Take 1 tablet by mouth daily.     pantoprazole  (PROTONIX ) 40 MG tablet TAKE 1 TABLET BY MOUTH ONCE DAILY TO  PREVENT  INDIGESTION  AND  HEARTBURN 90 tablet 3   pioglitazone  (ACTOS ) 30 MG tablet Take 1 tablet (30 mg total) by mouth daily. 90 tablet 1   potassium chloride  SA (KLOR-CON  M) 20 MEQ tablet Take 1 tablet 3 x /day with Meals for Potassium                                                                /                                           TAKE                                       BY                                       MOUTH 270 tablet 3   predniSONE  (DELTASONE ) 10 MG tablet Take 10 mg by mouth 2 (two) times daily with a meal.     predniSONE  (STERAPRED UNI-PAK 21 TAB) 10 MG (21) TBPK tablet Take by mouth daily. Take 6 tabs by mouth daily  for 2 days, then 5 tabs for 2 days, then 4 tabs for 2 days, then 3 tabs for 2 days, 2 tabs for 2 days, then 1 tab by mouth daily for 2 days 42 tablet 0   rOPINIRole  (REQUIP ) 1 MG tablet Take  1 tablet 3 x / day as Needed for Restless Legs        /       TAKE      BY      MOUTH 270 tablet 3   rosuvastatin  (CRESTOR ) 20 MG tablet TAKE 1 TABLET EVERY DAY FOR CHOLESTEROL 90 tablet 3   Turmeric (QC TUMERIC COMPLEX PO) Take 1 capsule by mouth daily.     Zinc 30 MG CAPS Take 1 capsule by mouth daily.     No current facility-administered medications on file prior to visit.   Allergies  Allergen Reactions   Naprosyn [Naproxen] Other (See Comments)    GI upset   Social History   Socioeconomic History   Marital status: Married    Spouse name: Not on file   Number of children: Not on file   Years of education: Not on file   Highest education level: Not on file  Occupational History  Not on file  Tobacco Use   Smoking status: Never   Smokeless tobacco: Never  Vaping Use   Vaping status: Never Used  Substance and Sexual Activity   Alcohol use: Not Currently   Drug use: No   Sexual activity: Not Currently  Other Topics Concern   Not on file  Social History Narrative   Pt lives with husband    Pt works    Social Drivers of Corporate investment banker Strain: Not on Ship broker Insecurity: Not on file  Transportation Needs: Not on file  Physical Activity: Not on file  Stress: Not on file  Social Connections: Not on file  Intimate Partner Violence: Not on file      Review of Systems  Respiratory:  Positive for cough.   All other systems reviewed and are negative.      Objective:   Physical  Exam Constitutional:      Appearance: Normal appearance. She is normal weight.  HENT:     Right Ear: Tympanic membrane and ear canal normal.     Left Ear: Tympanic membrane and ear canal normal.     Nose: Nose normal. No congestion or rhinorrhea.     Mouth/Throat:     Pharynx: No posterior oropharyngeal erythema.  Cardiovascular:     Rate and Rhythm: Normal rate and regular rhythm.     Heart sounds: Normal heart sounds.  Pulmonary:     Effort: Pulmonary effort is normal. No respiratory distress.     Breath sounds: Normal breath sounds. No wheezing, rhonchi or rales.  Lymphadenopathy:     Cervical: No cervical adenopathy.  Neurological:     Mental Status: She is alert.           Assessment & Plan:  Chronic cough We discussed potential causes of upper airway cough syndrome including acid reflux, allergies, and asthma.  However I feel that the most likely cause of her cough is the fact she is on lisinopril .  I recommended she discontinue lisinopril  hydrochlorothiazide .  Replace simply with hydrochlorothiazide  25 mg a day.  Follow-up with her PCP in 2 weeks to see if cough is improving.  Consider replacing with losartan if cough is better

## 2023-12-14 NOTE — Patient Instructions (Incomplete)

## 2023-12-14 NOTE — Progress Notes (Deleted)
 PATIENT: [name] DOB: 04-05-1955  REASON FOR VISIT: follow up HISTORY FROM: patient  No chief complaint on file.    HISTORY OF PRESENT ILLNESS:  12/14/23 ALL:  [name] returns for follow up for OSA on CPAP. She was last seen 05/2023 and had not used CPAP recently due to an error message on her machine. She was advised to contact DME. Since,   05/30/2023 ALL:  [name] is a 68 y.o. female here today for follow up for OSA on CPAP.  She was seen in consult with Dr Buck 12/2022 for concerns of witnessed apneas and daytime sleepiness. HST showed mild obstructive sleep apnea with a total AHI of 12.9/hour and O2 nadir of 81%. AutoPAP advised.   Since, she reports using therapy for 1 night. She reports receiving an error regarding water reservoir. She has not reached out to [facility] regarding error message. She reports working nights and plans to switch to days soon. She feels she will be able to be more consistent with usage at that time.    HISTORY: (copied from Dr Obie previous note)  Dear Dr. Tonita,    I saw your patient, [name], upon your kind request in my sleep clinic today for initial consultation of her sleep disorder, in particular, concern for obstructive sleep apnea. The patient is unaccompanied today. As you know, the patient is a very pleasant 68 year-old female with an underlying medical history of allergies, asthma, bicuspid aortic valve, diabetes, degenerative joint disease, hypertension, hyperlipidemia, vitamin D  deficiency, palpitations and overweight state, who reports witnessed apneas and daytime somnolence.  She is not sure if she snores.  Her husband does not complain.  Her mom has sleep apnea and she had a cousin with sleep apnea.  Epworth sleepiness score is 20 out of 24, fatigue severity score is 25 out of 63.  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  She generally snoring and excessive daytime somnolence as well as  witnessed apneas per husband's report.  Her Epworth sleepiness score is.  I reviewed your office note from 12/08/2022.  She has a TV in her bedroom but does not have to have it on at night or during the day.  They do not have any pets in the house.  She works third shift, generally from 6 PM to 6 AM or 6 PM to 4 AM.  She works from Sunday night through Friday morning.  She may go to bed around 6 AM if she gets home early or a little later when she gets home after 6.  She may sleep till 12 or 1 PM.  She denies nocturia.  She does not have recurrent nocturnal or morning headaches.  She drinks caffeine in the form of diet soda, usually 1/day.  She does not drink any alcohol.  She is a non-smoker.  She lives with her husband and son.  Her husband typically drives her to work and from work because she is sleepy.  Take Flexeril  before falling asleep and half of the gabapentin .   REVIEW OF SYSTEMS: Out of a complete 14 system review of symptoms, the patient complains only of the following symptoms, none and all other reviewed systems are negative.  ESS: 9/24  ALLERGIES: Allergies  Allergen Reactions   Naprosyn [Naproxen] Other (See Comments)    GI upset    HOME MEDICATIONS: Outpatient Medications Prior to Visit  Medication Sig Dispense Refill   albuterol  (VENTOLIN  HFA) 108 (90  Base) MCG/ACT inhaler Inhale 2 puffs into the lungs every 6 hours as needed for wheezing /short of breath 8.5 each 1   aspirin  EC 81 MG tablet Take 81 mg by mouth daily.     B Complex-C (SUPER B COMPLEX PO) Take 1 tablet by mouth daily. Takes every other day     benzonatate  (TESSALON ) 100 MG capsule Take 1 capsule (100 mg total) by mouth every 8 (eight) hours. 21 capsule 0   blood glucose meter kit and supplies KIT Dispense based on patient and insurance preference. Use up to four times daily as directed. 1 each 0   cetirizine (ZYRTEC) 10 MG chewable tablet Chew 10 mg by mouth every other day.     Cholecalciferol (VITAMIN D  PO)  Take 2 capsules by mouth daily. Take 5000 units by mouth twice a day     cyclobenzaprine  (FLEXERIL ) 10 MG tablet TAKE 1/2 TO 1 TABLET AT BEDTIME FOR MUSCLE SPASM 60 tablet 5   docusate sodium (COLACE) 100 MG capsule Take 100 mg by mouth daily.     DULoxetine  (CYMBALTA ) 30 MG capsule Take 1 capsule (30 mg total) by mouth daily. 90 capsule 0   empagliflozin  (JARDIANCE ) 25 MG TABS tablet Take 1 tab prior to breakfast daily for diabetes. 90 tablet 3   famotidine  (PEPCID ) 20 MG tablet Take 1 tablet (20 mg total) by mouth daily. 90 tablet 1   ferrous sulfate 325 (65 FE) MG tablet Take by mouth.     gabapentin  (NEURONTIN ) 600 MG tablet TAKE 1/2 TO 1 TABLET 2 TO 3 TIMES DAILY AS NEEDED FOR NEUROPATHY PAIN 270 tablet 3   hydrochlorothiazide  (HYDRODIURIL ) 25 MG tablet Take 1 tablet (25 mg total) by mouth daily. 90 tablet 3   HYDROcodone  bit-homatropine (HYCODAN) 5-1.5 MG/5ML syrup Take 5 mLs by mouth every 8 (eight) hours as needed for cough. 60 mL 0   meloxicam  (MOBIC ) 15 MG tablet TAKE 1/2 TO 1 TABLET DAILY WITH FOOD FOR PAIN AND INFLAMMATION 90 tablet 3   METAMUCIL FIBER PO Take 1 tablet by mouth daily.     metFORMIN  (GLUCOPHAGE -XR) 500 MG 24 hr tablet TAKE 2 TABLETS TWICE A DAY WITH MEALS 360 tablet 3   metoprolol  succinate (TOPROL -XL) 25 MG 24 hr tablet TAKE 1 TABLET EVERY DAY FOR BLOOD PRESSURE 90 tablet 3   mometasone  (NASONEX ) 50 MCG/ACT nasal spray Place 2 sprays into the nose daily. 51 g 3   Multiple Vitamin (MULITIVITAMIN WITH MINERALS) TABS Take 1 tablet by mouth daily.     pantoprazole  (PROTONIX ) 40 MG tablet TAKE 1 TABLET BY MOUTH ONCE DAILY TO  PREVENT  INDIGESTION  AND  HEARTBURN 90 tablet 3   pioglitazone  (ACTOS ) 30 MG tablet Take 1 tablet (30 mg total) by mouth daily. 90 tablet 1   potassium chloride  SA (KLOR-CON  M) 20 MEQ tablet Take 1 tablet 3 x /day with Meals for Potassium                                                                /  TAKE                                       BY                                       MOUTH 270 tablet 3   predniSONE  (DELTASONE ) 10 MG tablet Take 10 mg by mouth 2 (two) times daily with a meal.     predniSONE  (STERAPRED UNI-PAK 21 TAB) 10 MG (21) TBPK tablet Take by mouth daily. Take 6 tabs by mouth daily  for 2 days, then 5 tabs for 2 days, then 4 tabs for 2 days, then 3 tabs for 2 days, 2 tabs for 2 days, then 1 tab by mouth daily for 2 days 42 tablet 0   rOPINIRole  (REQUIP ) 1 MG tablet Take  1 tablet 3 x / day as Needed for Restless Legs        /       TAKE      BY      MOUTH 270 tablet 3   rosuvastatin  (CRESTOR ) 20 MG tablet TAKE 1 TABLET EVERY DAY FOR CHOLESTEROL 90 tablet 3   Turmeric (QC TUMERIC COMPLEX PO) Take 1 capsule by mouth daily.     Zinc 30 MG CAPS Take 1 capsule by mouth daily.     No facility-administered medications prior to visit.    PAST MEDICAL HISTORY: Past Medical History:  Diagnosis Date   Allergic rhinitis    ASTHMA    BICUSPID AORTIC VALVE    CEREBROVASCULAR DISEASE    DEGENERATIVE JOINT DISEASE    Diabetes mellitus    DJD (degenerative joint disease)    GERD    HSV-1 (herpes simplex virus 1) infection    HYPERLIPIDEMIA    Hypertension    Palpitation    Vitamin D  deficiency     PAST SURGICAL HISTORY: Past Surgical History:  Procedure Laterality Date   ABDOMINAL HYSTERECTOMY     EYE SURGERY     repair of trigger finger      FAMILY HISTORY: Family History  Problem Relation Age of Onset   Coronary artery disease Mother    Heart disease Mother    Hypertension Mother    Sleep apnea Mother    Diabetes Father    Hypertension Father    Diabetes Son    Breast cancer Maternal Aunt     SOCIAL HISTORY: Social History   Socioeconomic History   Marital status: Married    Spouse name: Not on file   Number of children: Not on file   Years of education: Not on file   Highest education level: Not on file  Occupational History   Not on file  Tobacco Use    Smoking status: Never   Smokeless tobacco: Never  Vaping Use   Vaping status: Never Used  Substance and Sexual Activity   Alcohol use: Not Currently   Drug use: No   Sexual activity: Not Currently  Other Topics Concern   Not on file  Social History Narrative   Pt lives with husband    Pt works    Social Drivers of Corporate Investment Banker Strain: Not on file  Food Insecurity: Not on file  Transportation Needs: Not on file  Physical Activity: Not on file  Stress: Not on file  Social Connections: Not on file  Intimate Partner Violence: Not on file     PHYSICAL EXAM  There were no vitals filed for this visit.  There is no height or weight on file to calculate BMI.  Generalized: Well developed, in no acute distress  Cardiology: normal rate and rhythm, no murmur noted Respiratory: clear to auscultation bilaterally  Neurological examination  Mentation: Alert oriented to time, place, history taking. Follows all commands speech and language fluent Cranial nerve II-XII: Pupils were equal round reactive to light. Extraocular movements were full, visual field were full, right eye deviates to right.  Motor: The motor testing reveals 5 over 5 strength of all 4 extremities. Good symmetric motor tone is noted throughout.  Gait and station: Gait is normal.    DIAGNOSTIC DATA (LABS, IMAGING, TESTING) - I reviewed patient records, labs, notes, testing and imaging myself where available.      No data to display           Lab Results  Component Value Date   WBC 6.7 10/29/2023   HGB 11.4 (L) 10/29/2023   HCT 36.4 10/29/2023   MCV 97.1 10/29/2023   PLT 274 10/29/2023      Component Value Date/Time   NA 136 10/29/2023 1532   NA 139 02/22/2020 1108   K 3.7 10/29/2023 1532   CL 102 10/29/2023 1532   CO2 26 10/29/2023 1532   GLUCOSE 118 (H) 10/29/2023 1532   BUN 13 10/29/2023 1532   BUN 13 02/22/2020 1108   CREATININE 0.44 10/29/2023 1532   CREATININE 0.48 (L)  07/08/2023 0826   CALCIUM  8.8 (L) 10/29/2023 1532   PROT 6.6 10/29/2023 1532   ALBUMIN 3.6 10/29/2023 1532   AST 18 10/29/2023 1532   ALT 17 10/29/2023 1532   ALKPHOS 59 10/29/2023 1532   BILITOT 0.5 10/29/2023 1532   GFRNONAA >60 10/29/2023 1532   GFRNONAA 104 07/29/2020 1011   GFRAA 120 07/29/2020 1011   Lab Results  Component Value Date   CHOL 86 07/08/2023   HDL 47 (L) 07/08/2023   LDLCALC 26 07/08/2023   TRIG 47 07/08/2023   CHOLHDL 1.8 07/08/2023   Lab Results  Component Value Date   HGBA1C 8.4 (H) 10/11/2023   Lab Results  Component Value Date   VITAMINB12 265 07/08/2023   Lab Results  Component Value Date   TSH 1.01 12/08/2022     ASSESSMENT AND PLAN 68 y.o. year old female  has a past medical history of Allergic rhinitis, ASTHMA, BICUSPID AORTIC VALVE, CEREBROVASCULAR DISEASE, DEGENERATIVE JOINT DISEASE, Diabetes mellitus, DJD (degenerative joint disease), GERD, HSV-1 (herpes simplex virus 1) infection, HYPERLIPIDEMIA, Hypertension, Palpitation, and Vitamin D  deficiency. here with   No diagnosis found.    [name] has not started therapy due to an error message on CPAP machine. She was given contact information for [facility] and advised to reach out asap to have machine checked. Risks of untreated sleep apnea review and education materials provided. Healthy lifestyle habits encouraged. She will follow up in 4 months, sooner if needed. She verbalizes understanding and agreement with this plan.    No orders of the defined types were placed in this encounter.    No orders of the defined types were placed in this encounter.     Greig Forbes, FNP-C 12/14/2023, 8:27 AM [facility] [address], [address] [place], [place] [address] [phone]

## 2023-12-15 ENCOUNTER — Telehealth: Payer: Self-pay | Admitting: *Deleted

## 2023-12-15 NOTE — Telephone Encounter (Signed)
 Called patient to see if she is using cpap machine was not able to download date. Pt said she took the cpap machine back to the DME. Pt states she couldn't use cpap.    Pt asked the visit be canceled since no longer using cpap, she was not open to discuss other options,   Visit on 12/19/23 canceled.

## 2023-12-19 ENCOUNTER — Ambulatory Visit: Admitting: Family Medicine

## 2023-12-19 DIAGNOSIS — G4733 Obstructive sleep apnea (adult) (pediatric): Secondary | ICD-10-CM

## 2023-12-27 ENCOUNTER — Other Ambulatory Visit: Payer: Self-pay

## 2023-12-27 DIAGNOSIS — R052 Subacute cough: Secondary | ICD-10-CM

## 2023-12-28 ENCOUNTER — Other Ambulatory Visit: Payer: Self-pay | Admitting: Family Medicine

## 2024-01-16 ENCOUNTER — Other Ambulatory Visit

## 2024-01-16 DIAGNOSIS — E1169 Type 2 diabetes mellitus with other specified complication: Secondary | ICD-10-CM

## 2024-01-16 DIAGNOSIS — E559 Vitamin D deficiency, unspecified: Secondary | ICD-10-CM

## 2024-01-16 DIAGNOSIS — E1122 Type 2 diabetes mellitus with diabetic chronic kidney disease: Secondary | ICD-10-CM

## 2024-01-16 DIAGNOSIS — I159 Secondary hypertension, unspecified: Secondary | ICD-10-CM

## 2024-01-16 LAB — HEMOGLOBIN A1C
Hgb A1c MFr Bld: 9.6 % — ABNORMAL HIGH (ref <5.7)
Mean Plasma Glucose: 229 mg/dL
eAG (mmol/L): 12.7 mmol/L

## 2024-01-16 LAB — VITAMIN B12: Vitamin B-12: 319 pg/mL (ref 200–1100)

## 2024-01-16 LAB — LIPID PANEL
Cholesterol: 110 mg/dL (ref <200)
HDL: 50 mg/dL (ref >=50)
LDL Cholesterol (Calc): 48 mg/dL
Non-HDL Cholesterol (Calc): 60 mg/dL (ref <130)
Total CHOL/HDL Ratio: 2.2 (calc) (ref <5.0)
Triglycerides: 43 mg/dL (ref <150)

## 2024-01-16 LAB — CBC WITH DIFFERENTIAL/PLATELET
Absolute Lymphocytes: 1876 {cells}/uL (ref 850–3900)
Absolute Monocytes: 570 {cells}/uL (ref 200–950)
Basophils Absolute: 40 {cells}/uL (ref 0–200)
Basophils Relative: 0.6 %
Eosinophils Absolute: 181 {cells}/uL (ref 15–500)
Eosinophils Relative: 2.7 %
HCT: 37.4 % (ref 35.9–46.0)
Hemoglobin: 11.7 g/dL (ref 11.7–15.5)
MCH: 30.3 pg (ref 27.0–33.0)
MCHC: 31.3 g/dL — ABNORMAL LOW (ref 31.6–35.4)
MCV: 96.9 fL (ref 81.4–101.7)
MPV: 11.8 fL (ref 7.5–12.5)
Monocytes Relative: 8.5 %
Neutro Abs: 4033 {cells}/uL (ref 1500–7800)
Neutrophils Relative %: 60.2 %
Platelets: 283 Thousand/uL (ref 140–400)
RBC: 3.86 Million/uL (ref 3.80–5.10)
RDW: 12.9 % (ref 11.0–15.0)
Total Lymphocyte: 28 %
WBC: 6.7 Thousand/uL (ref 3.8–10.8)

## 2024-01-16 LAB — COMPREHENSIVE METABOLIC PANEL WITH GFR
AG Ratio: 1.7 (calc) (ref 1.0–2.5)
ALT: 14 U/L (ref 6–29)
AST: 14 U/L (ref 10–35)
Albumin: 4.2 g/dL (ref 3.6–5.1)
Alkaline phosphatase (APISO): 73 U/L (ref 37–153)
BUN: 17 mg/dL (ref 7–25)
CO2: 30 mmol/L (ref 20–32)
Calcium: 9.7 mg/dL (ref 8.6–10.4)
Chloride: 102 mmol/L (ref 98–110)
Creat: 0.52 mg/dL (ref 0.50–1.05)
Globulin: 2.5 g/dL (ref 1.9–3.7)
Glucose, Bld: 152 mg/dL — ABNORMAL HIGH (ref 65–99)
Potassium: 4.4 mmol/L (ref 3.5–5.3)
Sodium: 139 mmol/L (ref 135–146)
Total Bilirubin: 0.2 mg/dL (ref 0.2–1.2)
Total Protein: 6.7 g/dL (ref 6.1–8.1)
eGFR: 101 mL/min/1.73m2 (ref >=60)

## 2024-01-16 LAB — VITAMIN D 25 HYDROXY (VIT D DEFICIENCY, FRACTURES): Vit D, 25-Hydroxy: 67 ng/mL (ref 30–100)

## 2024-01-19 ENCOUNTER — Encounter: Payer: Self-pay | Admitting: Family Medicine

## 2024-01-19 ENCOUNTER — Ambulatory Visit: Admitting: Family Medicine

## 2024-01-19 ENCOUNTER — Telehealth: Payer: Self-pay | Admitting: Family Medicine

## 2024-01-19 VITALS — BP 160/78 | HR 92 | Temp 97.9°F | Ht <= 58 in | Wt 129.8 lb

## 2024-01-19 DIAGNOSIS — E1169 Type 2 diabetes mellitus with other specified complication: Secondary | ICD-10-CM

## 2024-01-19 DIAGNOSIS — I10 Essential (primary) hypertension: Secondary | ICD-10-CM

## 2024-01-19 DIAGNOSIS — K219 Gastro-esophageal reflux disease without esophagitis: Secondary | ICD-10-CM

## 2024-01-19 DIAGNOSIS — M7918 Myalgia, other site: Secondary | ICD-10-CM | POA: Insufficient documentation

## 2024-01-19 DIAGNOSIS — E1122 Type 2 diabetes mellitus with diabetic chronic kidney disease: Secondary | ICD-10-CM

## 2024-01-19 MED ORDER — HYDROCHLOROTHIAZIDE 25 MG PO TABS: 25.0000 mg | ORAL_TABLET | Freq: Every day | ORAL | 3 refills | Status: AC

## 2024-01-19 NOTE — Telephone Encounter (Signed)
 Copied from CRM #[id number]. Topic: General - Other >> Jan 19, 2024  9:09 AM Alexandria E wrote: Reason for CRM: Patient was seen this morning and called back to confirm that it was the pioglitazone  (ACTOS ) 30 MG tablet medication that she is taking once a day. Patient stated that her PCP will know what she means.

## 2024-01-19 NOTE — Progress Notes (Signed)
 Acute Office Visit  Patient ID: [name], female    DOB: September 15, 1955, 68 y.o.   MRN: [id number]  PCP: Kayla Jeoffrey RAMAN, FNP  Chief Complaint  Patient presents with   Follow-up    54mo f/u Shoulders and hip pain     Subjective:     HPI  Ms Pinnix is here today for 3 month follow up and chronic condition management. PMH includes HTN, HLD, DM2, vitamin D  deficiency, GERD and most recently a chronic cough.   Discussed the use of AI scribe software for clinical note transcription with the patient, who gave verbal consent to proceed.  History of Present Illness [name] is a 68 year old female with hypertension and diabetes who presents with a persistent cough.  She has experienced a persistent cough for several months. Despite treatment for bronchitis and gastroesophageal reflux disease (GERD) with medications such as omeprazole and Pepcid , the cough persists. The cough has lessened since discontinuing lisinopril , but it has not resolved completely. No sputum production is noted. Is also using PRN albuterol . Has scheduled with ENT.  She has a history of hypertension and was previously on lisinopril -hydrochlorothiazide , which was stopped due to the cough. She did not continue hydrochlorothiazide  as ordered when lisinopril  was discontinued. She does not monitor her blood pressure at home despite having a blood pressure cuff. She is not currently taking any antihypertensive medication but was previously on hydrochlorothiazide  and lisinopril .  She has diabetes and is currently taking metformin  1000 mg twice daily and Actos  once daily. She experienced gastrointestinal side effects with Jardiance  and Rybelsus , leading to their discontinuation. She is unsure of the name of another diabetes medication she is taking but believes it was prescribed recently. Her A1c is high, which she attributes to weight gain and changes in her medication regimen. She recalls being on a  medication that helped with weight loss in the past, but it was discontinued several months ago. Is scheduled with Endocrinology this month.  She reports pain in her right upper arm and shoulder, sometimes radiating to her left shoulder, without any injury or change in activity. Additionally, she mentions pain in her left hip that has been present for one to two weeks, without radiation down the leg.  No chest pain, shortness of breath, palpitations, swelling in arms or legs, and no pain radiating down the leg. She reports occasional shortness of breath and uses albuterol  without relief.   Review of Systems  All other systems reviewed and are negative.      Objective:    BP (!) 160/78 Comment: right arm office cuff  Pulse 92   Temp 97.9 F (36.6 C)   Ht 4' 10 (1.473 m)   Wt 129 lb 12.8 oz (58.9 kg)   SpO2 96%   BMI 27.13 kg/m  BP Readings from Last 3 Encounters:  01/19/24 (!) 160/78  12/09/23 132/64  11/17/23 134/62   Wt Readings from Last 3 Encounters:  01/19/24 129 lb 12.8 oz (58.9 kg)  12/09/23 125 lb 3.2 oz (56.8 kg)  11/17/23 124 lb 6.4 oz (56.4 kg)      Physical Exam Vitals and nursing note reviewed.  Constitutional:      Appearance: Normal appearance. She is normal weight.  HENT:     Head: Normocephalic and atraumatic.  Cardiovascular:     Rate and Rhythm: Normal rate and regular rhythm.     Pulses: Normal pulses.     Heart sounds: Normal heart sounds.  Pulmonary:     Effort: Pulmonary effort is normal.     Breath sounds: Normal breath sounds.  Musculoskeletal:        General: No swelling or tenderness. Normal range of motion.     Lumbar back: Normal. No spasms, tenderness or bony tenderness. Negative right straight leg raise test and negative left straight leg raise test.     Right hip: Normal.     Left hip: Normal.  Skin:    General: Skin is warm and dry.  Neurological:     General: No focal deficit present.     Mental Status: She is alert and  oriented to person, place, and time. Mental status is at baseline.  Psychiatric:        Mood and Affect: Mood normal.        Behavior: Behavior normal.        Thought Content: Thought content normal.        Judgment: Judgment normal.       No results found for any visits on 01/19/24.     Assessment & Plan:   Problem List Items Addressed This Visit     GERD   Hyperlipidemia associated with type 2 diabetes mellitus (HCC)   Relevant Medications   hydrochlorothiazide  (HYDRODIURIL ) 25 MG tablet   Essential hypertension - Primary   Relevant Medications   hydrochlorothiazide  (HYDRODIURIL ) 25 MG tablet   Type 2 diabetes with stage 1 chronic kidney disease GFR>90 (HCC)   Left buttock pain    Assessment and Plan Assessment & Plan Chronic cough Cough persists despite previous treatments. ENT evaluation scheduled. Pulmonology referral considered if ENT is inconclusive. - Continue with ENT evaluation next week. - Consider pulmonology referral if ENT evaluation is inconclusive.  Essential hypertension Blood pressure remains elevated after stopping lisinopril . No current home monitoring.. - Reordered hydrochlorothiazide  to [facility]. - Encouraged home blood pressure monitoring. - Will start Losartan if blood pressure remains elevated  Type 2 diabetes mellitus with stage 1 chronic kidney disease Diabetes poorly controlled. A1c elevated. Endocrinologist appointment scheduled. - Continue metformin  1000 mg twice daily. - Continue pioglitazone  30 mg daily. - Discuss potential addition of oral Mounjaro  with endocrinologist. - She refuses injectables of any type - Follow up with endocrinologist next week.  Gastroesophageal reflux disease without esophagitis - Continue Protonix  40mg  daily  Left hip pain Intermittent pain, likely muscle strain. No concerning symptoms. - Recommended over-the-counter lidocaine  patches for pain relief. - Advised to monitor for worsening pain or new  symptoms such as radiation down the leg.    Meds ordered this encounter  Medications   hydrochlorothiazide  (HYDRODIURIL ) 25 MG tablet    Sig: Take 1 tablet (25 mg total) by mouth daily.    Dispense:  90 tablet    Refill:  3    Supervising Provider:   DUANNE LOWERS T [[id number]]    Return in about 2 weeks (around 02/02/2024).  Jeoffrey GORMAN Barrio, FNP [facility]

## 2024-01-24 ENCOUNTER — Institutional Professional Consult (permissible substitution) (INDEPENDENT_AMBULATORY_CARE_PROVIDER_SITE_OTHER): Admitting: Otolaryngology

## 2024-02-02 ENCOUNTER — Encounter: Payer: Self-pay | Admitting: Family Medicine

## 2024-02-02 ENCOUNTER — Ambulatory Visit: Admitting: Family Medicine

## 2024-02-02 VITALS — BP 123/71 | HR 78 | Temp 97.6°F | Ht <= 58 in | Wt 129.0 lb

## 2024-02-02 DIAGNOSIS — I10 Essential (primary) hypertension: Secondary | ICD-10-CM | POA: Diagnosis not present

## 2024-02-02 NOTE — Progress Notes (Signed)
 Acute Office Visit  Patient ID: [name], female    DOB: Feb 18, 1955, 68 y.o.   MRN: [id number]  PCP: Kayla Anna RAMAN, FNP  Chief Complaint  Patient presents with   Medical Management of Chronic Issues    2 wk f/u still coughing      Subjective:     HPI  Discussed the use of AI scribe software for clinical note transcription with the patient, who gave verbal consent to proceed.  History of Present Illness [name] is a 68 year old female with hypertension who presents for a follow-up visit.  She is currently on hydrochlorothiazide  and metoprolol  for hypertension. Has not been monitoring blood pressure at home.   No chest pain, palpitations, recurrent headaches, vision changes, lightheadedness, dizziness, dyspnea on exertion, or swelling of extremities.    Review of Systems  All other systems reviewed and are negative.   Past Medical History:  Diagnosis Date   Allergic rhinitis    ASTHMA    BICUSPID AORTIC VALVE    CEREBROVASCULAR DISEASE    DEGENERATIVE JOINT DISEASE    Diabetes mellitus    DJD (degenerative joint disease)    GERD    HSV-1 (herpes simplex virus 1) infection    HYPERLIPIDEMIA    Hypertension    Palpitation    Vitamin D  deficiency     Past Surgical History:  Procedure Laterality Date   ABDOMINAL HYSTERECTOMY     EYE SURGERY     repair of trigger finger      Outpatient Medications Prior to Visit  Medication Sig Dispense Refill   albuterol  (VENTOLIN  HFA) 108 (90 Base) MCG/ACT inhaler Inhale 2 puffs into the lungs every 6 hours as needed for wheezing /short of breath 8.5 each 1   aspirin  EC 81 MG tablet Take 81 mg by mouth daily.     B Complex-C (SUPER B COMPLEX PO) Take 1 tablet by mouth daily. Takes every other day     blood glucose meter kit and supplies KIT Dispense based on patient and insurance preference. Use up to four times daily as directed. 1 each 0   cetirizine (ZYRTEC) 10 MG chewable tablet Chew 10 mg by  mouth every other day.     Cholecalciferol (VITAMIN D  PO) Take 2 capsules by mouth daily. Take 5000 units by mouth twice a day     cyclobenzaprine  (FLEXERIL ) 10 MG tablet TAKE 1/2 TO 1 TABLET AT BEDTIME FOR MUSCLE SPASM 60 tablet 5   docusate sodium (COLACE) 100 MG capsule Take 100 mg by mouth daily.     DULoxetine  (CYMBALTA ) 30 MG capsule Take 1 capsule by mouth every day 30 capsule 11   ferrous sulfate 325 (65 FE) MG tablet Take by mouth.     gabapentin  (NEURONTIN ) 600 MG tablet TAKE 1/2 TO 1 TABLET 2 TO 3 TIMES DAILY AS NEEDED FOR NEUROPATHY PAIN 270 tablet 3   hydrochlorothiazide  (HYDRODIURIL ) 25 MG tablet Take 1 tablet (25 mg total) by mouth daily. 90 tablet 3   meloxicam  (MOBIC ) 15 MG tablet TAKE 1/2 TO 1 TABLET DAILY WITH FOOD FOR PAIN AND INFLAMMATION 90 tablet 3   METAMUCIL FIBER PO Take 1 tablet by mouth daily.     metFORMIN  (GLUCOPHAGE -XR) 500 MG 24 hr tablet TAKE 2 TABLETS TWICE A DAY WITH MEALS 360 tablet 3   metoprolol  succinate (TOPROL -XL) 25 MG 24 hr tablet TAKE 1 TABLET EVERY DAY FOR BLOOD PRESSURE 90 tablet 3   Multiple Vitamin (MULITIVITAMIN WITH  MINERALS) TABS Take 1 tablet by mouth daily.     pantoprazole  (PROTONIX ) 40 MG tablet TAKE 1 TABLET BY MOUTH ONCE DAILY TO  PREVENT  INDIGESTION  AND  HEARTBURN 90 tablet 3   pioglitazone  (ACTOS ) 30 MG tablet Take 1 tablet (30 mg total) by mouth daily. 90 tablet 1   potassium chloride  SA (KLOR-CON  M) 20 MEQ tablet Take 1 tablet 3 x /day with Meals for Potassium                                                                /                                           TAKE                                      BY                                       MOUTH 270 tablet 3   rOPINIRole  (REQUIP ) 1 MG tablet Take  1 tablet 3 x / day as Needed for Restless Legs        /       TAKE      BY      MOUTH 270 tablet 3   rosuvastatin  (CRESTOR ) 20 MG tablet TAKE 1 TABLET EVERY DAY FOR CHOLESTEROL 90 tablet 3   Turmeric (QC TUMERIC COMPLEX PO) Take 1  capsule by mouth daily.     Zinc 30 MG CAPS Take 1 capsule by mouth daily.     No facility-administered medications prior to visit.    Allergies[1]     Objective:    BP 123/71   Pulse 78   Temp 97.6 F (36.4 C)   Ht 4' 10 (1.473 m)   Wt 129 lb (58.5 kg)   SpO2 98%   BMI 26.96 kg/m  BP Readings from Last 3 Encounters:  02/02/24 123/71  01/19/24 (!) 160/78  12/09/23 132/64   Wt Readings from Last 3 Encounters:  02/02/24 129 lb (58.5 kg)  01/19/24 129 lb 12.8 oz (58.9 kg)  12/09/23 125 lb 3.2 oz (56.8 kg)      Physical Exam Vitals and nursing note reviewed.  Constitutional:      Appearance: Normal appearance. She is normal weight.  HENT:     Head: Normocephalic and atraumatic.  Cardiovascular:     Rate and Rhythm: Normal rate and regular rhythm.     Pulses: Normal pulses.     Heart sounds: Normal heart sounds.  Pulmonary:     Effort: Pulmonary effort is normal.     Breath sounds: Normal breath sounds.  Skin:    General: Skin is warm and dry.  Neurological:     General: No focal deficit present.     Mental Status: She is alert and oriented to person, place, and time. Mental status is at baseline.  Psychiatric:  Mood and Affect: Mood normal.        Behavior: Behavior normal.        Thought Content: Thought content normal.        Judgment: Judgment normal.       No results found for any visits on 02/02/24.     Assessment & Plan:   Problem List Items Addressed This Visit       Cardiovascular and Mediastinum   Essential hypertension - Primary   Relevant Orders   Comprehensive metabolic panel with GFR    Assessment and Plan Assessment & Plan Essential hypertension Blood pressure well-controlled.  - Continue hydrochlorothiazide  and metoprolol . - Recheck CMP today. - Schedule follow-up in 3 months for CPE    No orders of the defined types were placed in this encounter.   Return in about 3 months (around 05/02/2024) for annual physical  with labs 1 week prior.  Anna GORMAN Barrio, FNP [place] [facility]      [1]  Allergies Allergen Reactions   Naprosyn [Naproxen] Other (See Comments)    GI upset

## 2024-02-03 LAB — COMPREHENSIVE METABOLIC PANEL WITH GFR
AG Ratio: 1.9 (calc) (ref 1.0–2.5)
ALT: 12 U/L (ref 6–29)
AST: 13 U/L (ref 10–35)
Albumin: 4.3 g/dL (ref 3.6–5.1)
Alkaline phosphatase (APISO): 73 U/L (ref 37–153)
BUN/Creatinine Ratio: 29 (calc) — ABNORMAL HIGH (ref 6–22)
BUN: 14 mg/dL (ref 7–25)
CO2: 36 mmol/L — ABNORMAL HIGH (ref 20–32)
Calcium: 9.4 mg/dL (ref 8.6–10.4)
Chloride: 97 mmol/L — ABNORMAL LOW (ref 98–110)
Creat: 0.48 mg/dL — ABNORMAL LOW (ref 0.50–1.05)
Globulin: 2.3 g/dL (ref 1.9–3.7)
Glucose, Bld: 149 mg/dL — ABNORMAL HIGH (ref 65–99)
Potassium: 4.3 mmol/L (ref 3.5–5.3)
Sodium: 138 mmol/L (ref 135–146)
Total Bilirubin: 0.2 mg/dL (ref 0.2–1.2)
Total Protein: 6.6 g/dL (ref 6.1–8.1)
eGFR: 103 mL/min/1.73m2

## 2024-02-07 ENCOUNTER — Ambulatory Visit: Admitting: Nurse Practitioner

## 2024-02-07 ENCOUNTER — Encounter: Payer: Self-pay | Admitting: Nurse Practitioner

## 2024-02-07 VITALS — BP 130/72 | HR 76 | Ht <= 58 in | Wt 126.6 lb

## 2024-02-07 DIAGNOSIS — Z7984 Long term (current) use of oral hypoglycemic drugs: Secondary | ICD-10-CM

## 2024-02-07 DIAGNOSIS — I10 Essential (primary) hypertension: Secondary | ICD-10-CM

## 2024-02-07 DIAGNOSIS — E1165 Type 2 diabetes mellitus with hyperglycemia: Secondary | ICD-10-CM

## 2024-02-07 NOTE — Progress Notes (Signed)
 "                                                                        Endocrinology Consult Note       02/07/2024, 9:59 AM   Subjective:    Patient ID: [name], female    DOB: 15-Feb-1956.  [name] is being seen in consultation for management of currently uncontrolled symptomatic diabetes requested by  Kayla Jeoffrey RAMAN, FNP.   Past Medical History:  Diagnosis Date   Allergic rhinitis    ASTHMA    BICUSPID AORTIC VALVE    CEREBROVASCULAR DISEASE    DEGENERATIVE JOINT DISEASE    Diabetes mellitus    DJD (degenerative joint disease)    GERD    HSV-1 (herpes simplex virus 1) infection    HYPERLIPIDEMIA    Hypertension    Palpitation    Vitamin D  deficiency     Past Surgical History:  Procedure Laterality Date   ABDOMINAL HYSTERECTOMY     EYE SURGERY     repair of trigger finger      Social History   Socioeconomic History   Marital status: Married    Spouse name: Not on file   Number of children: Not on file   Years of education: Not on file   Highest education level: Not on file  Occupational History   Not on file  Tobacco Use   Smoking status: Never   Smokeless tobacco: Never  Vaping Use   Vaping status: Never Used  Substance and Sexual Activity   Alcohol use: Not Currently   Drug use: No   Sexual activity: Not Currently  Other Topics Concern   Not on file  Social History Narrative   Pt lives with husband    Pt works    Social Drivers of Health   Tobacco Use: Low Risk (02/07/2024)   Patient History    Smoking Tobacco Use: Never    Smokeless Tobacco Use: Never    Passive Exposure: Not on file  Financial Resource Strain: Not on file  Food Insecurity: Not on file  Transportation Needs: Not on file  Physical Activity: Not on file  Stress: Not on file  Social Connections: Not on file  Depression (PHQ2-9): Low Risk (01/19/2024)   Depression (PHQ2-9)    PHQ-2 Score: 3  Recent Concern: Depression (PHQ2-9) - Medium Risk  (11/02/2023)   Depression (PHQ2-9)    PHQ-2 Score: 7  Alcohol Screen: Not on file  Housing: Not on file  Utilities: Not on file  Health Literacy: Not on file    Family History  Problem Relation Age of Onset   Coronary artery disease Mother    Heart disease Mother    Hypertension Mother    Sleep apnea Mother    Diabetes Father    Hypertension Father    Diabetes Son    Breast cancer Maternal Aunt     Outpatient Encounter Medications as of 02/07/2024  Medication Sig   albuterol  (VENTOLIN  HFA) 108 (90 Base) MCG/ACT inhaler Inhale 2 puffs into the lungs every 6 hours as needed for wheezing /short of breath   aspirin  EC 81 MG tablet Take 81 mg by mouth daily.   B Complex-C (SUPER  B COMPLEX PO) Take 1 tablet by mouth daily. Takes every other day   cetirizine (ZYRTEC) 10 MG chewable tablet Chew 10 mg by mouth every other day.   Cholecalciferol (VITAMIN D  PO) Take 2 capsules by mouth daily. Take 5000 units by mouth twice a day   cyclobenzaprine  (FLEXERIL ) 10 MG tablet TAKE 1/2 TO 1 TABLET AT BEDTIME FOR MUSCLE SPASM   docusate sodium (COLACE) 100 MG capsule Take 100 mg by mouth daily.   DULoxetine  (CYMBALTA ) 30 MG capsule Take 1 capsule by mouth every day   ferrous sulfate 325 (65 FE) MG tablet Take by mouth.   gabapentin  (NEURONTIN ) 600 MG tablet TAKE 1/2 TO 1 TABLET 2 TO 3 TIMES DAILY AS NEEDED FOR NEUROPATHY PAIN   hydrochlorothiazide  (HYDRODIURIL ) 25 MG tablet Take 1 tablet (25 mg total) by mouth daily.   lisinopril -hydrochlorothiazide  (ZESTORETIC ) 10-12.5 MG tablet Take 1 tablet by mouth daily.   MAGNESIUM PO Take 500 mg by mouth daily.   meloxicam  (MOBIC ) 15 MG tablet TAKE 1/2 TO 1 TABLET DAILY WITH FOOD FOR PAIN AND INFLAMMATION   METAMUCIL FIBER PO Take 1 tablet by mouth daily.   metFORMIN  (GLUCOPHAGE -XR) 500 MG 24 hr tablet TAKE 2 TABLETS TWICE A DAY WITH MEALS   metoprolol  succinate (TOPROL -XL) 25 MG 24 hr tablet TAKE 1 TABLET EVERY DAY FOR BLOOD PRESSURE   Multiple Vitamin  (MULITIVITAMIN WITH MINERALS) TABS Take 1 tablet by mouth daily.   Multiple Vitamins-Minerals (VISION FORMULA PO) Take 1 mg by mouth daily.   pantoprazole  (PROTONIX ) 40 MG tablet TAKE 1 TABLET BY MOUTH ONCE DAILY TO  PREVENT  INDIGESTION  AND  HEARTBURN   pioglitazone  (ACTOS ) 30 MG tablet Take 1 tablet (30 mg total) by mouth daily.   potassium chloride  SA (KLOR-CON  M) 20 MEQ tablet Take 1 tablet 3 x /day with Meals for Potassium                                                                /                                           TAKE                                      BY                                       MOUTH   rOPINIRole  (REQUIP ) 1 MG tablet Take  1 tablet 3 x / day as Needed for Restless Legs        /       TAKE      BY      MOUTH   rosuvastatin  (CRESTOR ) 20 MG tablet TAKE 1 TABLET EVERY DAY FOR CHOLESTEROL   Turmeric (QC TUMERIC COMPLEX PO) Take 1 capsule by mouth daily.   Zinc 30 MG CAPS Take 1 capsule by mouth daily.   blood glucose meter kit and supplies KIT Dispense based  on patient and insurance preference. Use up to four times daily as directed. (Patient not taking: Reported on 02/07/2024)   No facility-administered encounter medications on file as of 02/07/2024.    ALLERGIES: Allergies[1]  VACCINATION STATUS: Immunization History  Administered Date(s) Administered   DT (Pediatric) 10/11/2014   DTaP 03/18/2004   INFLUENZA, HIGH DOSE SEASONAL PF 11/18/2021, 11/17/2022, 11/06/2023   Influenza Inj Mdck Quad With Preservative 11/17/2016   Influenza Split 11/15/2013, 11/30/2014   Influenza Whole 11/02/2012   Influenza,inj,Quad PF,6+ Mos 10/21/2017, 09/30/2018   Influenza-Unspecified 10/31/2015, 10/21/2017, 11/27/2019   Moderna Covid-19 Fall Seasonal Vaccine 47yrs & older 12/02/2021, 11/17/2022   Moderna SARS-COV2 Booster Vaccination 05/26/2020   Moderna Sars-Covid-2 Vaccination 04/15/2019, 05/19/2019   PFIZER(Purple Top)SARS-COV-2 Vaccination 01/01/2020   PNEUMOCOCCAL  CONJUGATE-20 11/18/2021   PPD Test 07/30/2013, 10/11/2014, 11/06/2015, 11/17/2016, 12/12/2017, 12/27/2018, 01/08/2020   Pfizer(Comirnaty)Fall Seasonal Vaccine 12 years and older 11/06/2023   Pneumococcal Conjugate-13 11/15/2013, 11/17/2016   Pneumococcal Polysaccharide-23 05/16/1996   Respiratory Syncytial Virus Vaccine,Recomb Aduvanted(Arexvy) 12/02/2021   Tdap 11/06/2023   Zoster Recombinant(Shingrix) 01/02/2018, 04/05/2018, 04/19/2018    Diabetes She presents for her initial diabetic visit. She has type 2 diabetes mellitus. Onset time: diagnosed at age 51. Her disease course has been fluctuating. There are no hypoglycemic associated symptoms. There are no diabetic associated symptoms. There are no hypoglycemic complications. There are no diabetic complications. Risk factors for coronary artery disease include diabetes mellitus, family history, dyslipidemia, hypertension and post-menopausal. Current diabetic treatment includes oral agent (dual therapy). She is compliant with treatment most of the time. Her weight is fluctuating minimally. She is following a generally unhealthy diet. When asked about meal planning, she reported none. She has not had a previous visit with a dietitian. She participates in exercise intermittently. (She presents today for her consultation with no meter or logs to review.  She does not monitor glucose routinely due to fear of needles.  Her most recent A1c on 12/1 was 9.6%.  She drinks water, diet sprite (or other diet sodas), SF powerade.  She does not eat on any routine pattern and does snack often.  She works 3rd shift.  She maintains active lifestyle by walking with her job.  She is UTD on eye exam, does not follow with podiatry routinely.) An ACE inhibitor/angiotensin II receptor blocker is not being taken. She does not see a podiatrist.Eye exam is current.     Review of systems  Constitutional: + Minimally fluctuating body weight, current Body mass index is 26.46  kg/m., no fatigue, no subjective hyperthermia, no subjective hypothermia Eyes: no blurry vision, no xerophthalmia ENT: no sore throat, no nodules palpated in throat, no dysphagia/odynophagia, no hoarseness Cardiovascular: no chest pain, no shortness of breath, no palpitations, + intermittent leg swelling Respiratory: no cough, no shortness of breath Gastrointestinal: no nausea/vomiting/diarrhea Musculoskeletal: + diffuse muscle/joint aches- hx OA Skin: no rashes, no hyperemia Neurological: no tremors, no numbness, no tingling, no dizziness Psychiatric: no depression, no anxiety  Objective:     BP 130/72 (BP Location: Right Arm, Patient Position: Sitting, Cuff Size: Large)   Pulse 76   Ht 4' 10 (1.473 m)   Wt 126 lb 9.6 oz (57.4 kg)   BMI 26.46 kg/m   Wt Readings from Last 3 Encounters:  02/07/24 126 lb 9.6 oz (57.4 kg)  02/02/24 129 lb (58.5 kg)  01/19/24 129 lb 12.8 oz (58.9 kg)     BP Readings from Last 3 Encounters:  02/07/24 130/72  02/02/24 123/71  01/19/24 ROLLEN)  160/78     Physical Exam- Limited  Constitutional:  Body mass index is 26.46 kg/m. , not in acute distress, normal state of mind Eyes:  EOMI, no exophthalmos Neck: Supple Cardiovascular: RRR, no murmurs, rubs, or gallops, no edema Respiratory: Adequate breathing efforts, no crackles, rales, rhonchi, or wheezing Musculoskeletal: no gross deformities, strength intact in all four extremities, no gross restriction of joint movements Skin:  no rashes, no hyperemia Neurological: no tremor with outstretched hands   Diabetic Foot Exam - Simple   No data filed      CMP ( most recent) CMP     Component Value Date/Time   NA 138 02/02/2024 0942   NA 139 02/22/2020 1108   K 4.3 02/02/2024 0942   CL 97 (L) 02/02/2024 0942   CO2 36 (H) 02/02/2024 0942   GLUCOSE 149 (H) 02/02/2024 0942   BUN 14 02/02/2024 0942   BUN 13 02/22/2020 1108   CREATININE 0.48 (L) 02/02/2024 0942   CALCIUM  9.4 02/02/2024 0942    PROT 6.6 02/02/2024 0942   ALBUMIN 3.6 10/29/2023 1532   AST 13 02/02/2024 0942   ALT 12 02/02/2024 0942   ALKPHOS 59 10/29/2023 1532   BILITOT 0.2 02/02/2024 0942   GFR 121.03 05/05/2012 0858   EGFR 103 02/02/2024 0942   GFRNONAA >60 10/29/2023 1532   GFRNONAA 104 07/29/2020 1011     Diabetic Labs (most recent): Lab Results  Component Value Date   HGBA1C 9.6 (H) 01/16/2024   HGBA1C 8.4 (H) 10/11/2023   HGBA1C 8.6 (H) 07/08/2023   MICROALBUR <0.2 07/08/2023   MICROALBUR <0.2 02/18/2022   MICROALBUR 0.6 11/18/2021     Lipid Panel ( most recent) Lipid Panel     Component Value Date/Time   CHOL 110 01/16/2024 0806   TRIG 43 01/16/2024 0806   HDL 50 01/16/2024 0806   CHOLHDL 2.2 01/16/2024 0806   VLDL 20 09/03/2016 0929   LDLCALC 48 01/16/2024 0806      Lab Results  Component Value Date   TSH 1.01 12/08/2022   TSH 1.06 09/02/2022   TSH 1.26 02/18/2022   TSH 0.77 11/18/2021   TSH 1.39 06/11/2021   TSH 0.57 02/12/2021   TSH 1.15 11/03/2020   TSH 0.72 07/29/2020   TSH 1.97 04/22/2020   TSH 0.89 01/08/2020           Assessment & Plan:   1) Type 2 diabetes mellitus with hyperglycemia, without long-term current use of insulin  (HCC) (Primary)  She presents today for her consultation with no meter or logs to review.  She does not monitor glucose routinely due to fear of needles.  Her most recent A1c on 12/1 was 9.6%.  She drinks water, diet sprite (or other diet sodas), SF powerade.  She does not eat on any routine pattern and does snack often.  She works 3rd shift.  She maintains active lifestyle by walking with her job.  She is UTD on eye exam, does not follow with podiatry routinely.  [name] has currently uncontrolled symptomatic type 2 DM since 68 years of age, with most recent A1c of 9.6 %.   -Recent labs reviewed.  - I had a long discussion with her about the progressive nature of diabetes and the pathology behind its complications. -her  diabetes is not currently complicated but she remains at a high risk for more acute and chronic complications which include CAD, CVA, CKD, retinopathy, and neuropathy. These are all discussed in detail with her.  The following Lifestyle Medicine recommendations according to American College of Lifestyle Medicine [facility]) were discussed and offered to patient and she agrees to start the journey:  A. Whole Foods, Plant-based plate comprising of fruits and vegetables, plant-based proteins, whole-grain carbohydrates was discussed in detail with the patient.   A list for source of those nutrients were also provided to the patient.  Patient will use only water or unsweetened tea for hydration. B.  The need to stay away from risky substances including alcohol, smoking; obtaining 7 to 9 hours of restorative sleep, at least 150 minutes of moderate intensity exercise weekly, the importance of healthy social connections,  and stress reduction techniques were discussed. C.  A full color page of  Calorie density of various food groups per pound showing examples of each food groups was provided to the patient.  - I have counseled her on diet and weight management by adopting a carbohydrate restricted/protein rich diet. Patient is encouraged to switch to unprocessed or minimally processed complex starch and increased protein intake (animal or plant source), fruits, and vegetables. -  she is advised to stick to a routine mealtimes to eat 3 meals a day and avoid unnecessary snacks (to snack only to correct hypoglycemia).   - she acknowledges that there is a room for improvement in her food and drink choices. - Suggestion is made for her to avoid simple carbohydrates from her diet including Cakes, Sweet Desserts, Ice Cream, Soda (diet and regular), Sweet Tea, Candies, Chips, Cookies, Store Bought Juices, Alcohol in Excess of 1-2 drinks a day, Artificial Sweeteners, Coffee Creamer, and Sugar-free Products. This will help  patient to have more stable blood glucose profile and potentially avoid unintended weight gain.  - I have approached her with the following individualized plan to manage her diabetes and patient agrees:   -Patient prefers to stay away from injections.  -she is encouraged to start/continue monitoring glucose 4 times daily, before meals and before bed, and to call the clinic if she has readings less than 70 or above 300 for 3 tests in a row.  - Adjustment parameters are given to her for hypo and hyperglycemia in writing. - she is advised to continue Actos  30 mg po daily and Metformin  1000 mg po twice daily with meals, therapeutically suitable for patient .  - she is not an ideal candidate for GLP1/GIP therapy given body habitus.  She was previously on Invokana , Jardiance , Glyburide, and Rybelsus .  She says those caused upset stomach.  - Specific targets for  A1c; LDL, HDL, and Triglycerides were discussed with the patient.  2) Blood Pressure /Hypertension:  her blood pressure is controlled to target.   she is advised to continue her current medications as prescribed by PCP.  3) Lipids/Hyperlipidemia:    Review of her recent lipid panel from 01/16/24 showed controlled LDL at 48 .  she is advised to continue Crestor  20 mg daily at bedtime.  Side effects and precautions discussed with her.  4)  Weight/Diet:  her Body mass index is 26.46 kg/m.  -  she is NOT a candidate for weight loss.  Exercise, and detailed carbohydrates information provided  -  detailed on discharge instructions.  5) Chronic Care/Health Maintenance: -she is not on ACEI/ARB and is on Statin medications and is encouraged to initiate and continue to follow up with Ophthalmology, Dentist, Podiatrist at least yearly or according to recommendations, and advised to stay away from smoking. I have recommended yearly flu vaccine and pneumonia vaccine  at least every 5 years; moderate intensity exercise for up to 150 minutes weekly; and  sleep for at least 7 hours a day.  - she is advised to maintain close follow up with Kayla Jeoffrey RAMAN, FNP for primary care needs, as well as her other providers for optimal and coordinated care.   - Time spent in this patient care: 60 min, which was spent in counseling her about her diabetes and the rest reviewing her blood glucose logs, discussing her hypoglycemia and hyperglycemia episodes, reviewing her current and previous labs/studies (including abstraction from other facilities) and medications doses and developing a long term treatment plan based on the latest standards of care/guidelines; and documenting her care.    Please refer to Patient Instructions for Blood Glucose Monitoring and Insulin /Medications Dosing Guide in media tab for additional information. Please also refer to Patient Self Inventory in the Media tab for reviewed elements of pertinent patient history.  [name] participated in the discussions, expressed understanding, and voiced agreement with the above plans.  All questions were answered to her satisfaction. she is encouraged to contact clinic should she have any questions or concerns prior to her return visit.     Follow up plan: - Return in about 4 months (around 06/07/2024) for Diabetes F/U with A1c in office, No previsit labs, Bring meter and logs.    Benton Rio, FNP-BC [facility] [address] [place], [place] [address] Phone: [phone] Fax: [phone]  02/07/2024, 9:59 AM        [1]  Allergies Allergen Reactions   Naprosyn [Naproxen] Other (See Comments)    GI upset   "

## 2024-02-07 NOTE — Patient Instructions (Signed)

## 2024-02-13 ENCOUNTER — Telehealth: Payer: Self-pay

## 2024-02-13 ENCOUNTER — Other Ambulatory Visit: Payer: Self-pay

## 2024-02-13 DIAGNOSIS — E119 Type 2 diabetes mellitus without complications: Secondary | ICD-10-CM

## 2024-02-13 DIAGNOSIS — I10 Essential (primary) hypertension: Secondary | ICD-10-CM

## 2024-02-13 DIAGNOSIS — K219 Gastro-esophageal reflux disease without esophagitis: Secondary | ICD-10-CM

## 2024-02-13 MED ORDER — METFORMIN HCL ER 500 MG PO TB24: ORAL_TABLET | ORAL | 3 refills | Status: AC

## 2024-02-13 MED ORDER — PIOGLITAZONE HCL 30 MG PO TABS: 30.0000 mg | ORAL_TABLET | Freq: Every day | ORAL | 1 refills | Status: AC

## 2024-02-13 MED ORDER — DULOXETINE HCL 30 MG PO CPEP: 30.0000 mg | ORAL_CAPSULE | Freq: Every day | ORAL | 11 refills | Status: AC

## 2024-02-13 MED ORDER — PANTOPRAZOLE SODIUM 40 MG PO TBEC: DELAYED_RELEASE_TABLET | ORAL | 3 refills | Status: AC

## 2024-02-13 MED ORDER — METOPROLOL SUCCINATE ER 25 MG PO TB24: ORAL_TABLET | ORAL | 3 refills | Status: AC

## 2024-02-13 NOTE — Telephone Encounter (Signed)
 Prescription Request  02/13/2024  LOV: 02/02/24  What is the name of the medication or equipment? metFORMIN  (GLUCOPHAGE -XR) 500 MG 24 hr tablet [[id number]]   Have you contacted your pharmacy to request a refill? Yes   Which pharmacy would you like this sent to?  [facility] - [place], [place] - [address], [place] [address] Phone: [phone]  Fax: [phone] DEA #: --   Patient notified that their request is being sent to the clinical staff for review and that they should receive a response within 2 business days.   Please advise at [facility] [phone]  Prescription Request  02/13/2024  LOV:02/02/24  What is the name of the medication or equipment? DULoxetine  (CYMBALTA ) 30 MG capsule [[id number]]   Have you contacted your pharmacy to request a refill? Yes   Which pharmacy would you like this sent to?  [facility] - [place], [place] - [address], [place] [address] Phone: [phone]  Fax: [phone] DEA #: --   Patient notified that their request is being sent to the clinical staff for review and that they should receive a response within 2 business days.   Please advise at [facility] [phone]  Prescription Request  02/13/2024  LOV: 02/02/24  What is the name of the medication or equipment? pantoprazole  (PROTONIX ) 40 MG tablet [[id number]]   Have you contacted your pharmacy to request a refill? Yes   Which pharmacy would you like this sent to?  [facility] - [place], [place] - [address], [place] [address] Phone: [phone]  Fax: [phone] DEA #: --   Patient notified that their request is being sent to the clinical staff for review and that they should receive a response within 2 business days.   Please advise at [facility] [phone]  Prescription Request  02/13/2024  LOV: 02/02/24  What is the name of the medication or equipment? metoprolol  succinate (TOPROL -XL) 25 MG 24 hr tablet [[id number]]   Have you contacted  your pharmacy to request a refill? Yes   Which pharmacy would you like this sent to?   [facility] - [place], [place] - [address], [place] [address] Phone: [phone]  Fax: [phone]    Patient notified that their request is being sent to the clinical staff for review and that they should receive a response within 2 business days.   Please advise at [facility] [phone]  Prescription Request  02/13/2024  LOV: 02/02/24  What is the name of the medication or equipment? hydrochlorothiazide  (HYDRODIURIL ) 25 MG tablet [[id number]]   Have you contacted your pharmacy to request a refill? Yes   Which pharmacy would you like this sent to?   [facility] - [place], [place] - [address], [place] [address] Phone: [phone]  Fax: [phone]    Patient notified that their request is being sent to the clinical staff for review and that they should receive a response within 2 business days.   Please advise at [facility] [phone]  Prescription Request  02/13/2024  LOV: 02/02/24  What is the name of the medication or equipment? pioglitazone  (ACTOS ) 30 MG tablet [[id number]]   Have you contacted your pharmacy to request a refill? Yes   Which pharmacy would you like this sent to?  [facility] - [place], [place] - [address], [place] [address] Phone: [phone]  Fax: [phone]     Patient notified that their request is being sent to the clinical staff for  review and that they should receive a response within 2 business days.   Please advise at [facility] [phone]

## 2024-02-13 NOTE — Telephone Encounter (Signed)
 Orders refilled. Cymbalta  sent to provider for review . All meds sent to [facility] pharmacy as pt. Has changed insurance and pharmacies.

## 2024-02-15 ENCOUNTER — Ambulatory Visit (INDEPENDENT_AMBULATORY_CARE_PROVIDER_SITE_OTHER): Admitting: Otolaryngology

## 2024-02-15 ENCOUNTER — Encounter (INDEPENDENT_AMBULATORY_CARE_PROVIDER_SITE_OTHER): Payer: Self-pay | Admitting: Otolaryngology

## 2024-02-15 VITALS — BP 146/80 | HR 92 | Temp 98.2°F | Ht <= 58 in | Wt 129.0 lb

## 2024-02-15 DIAGNOSIS — R059 Cough, unspecified: Secondary | ICD-10-CM | POA: Diagnosis not present

## 2024-02-15 DIAGNOSIS — K219 Gastro-esophageal reflux disease without esophagitis: Secondary | ICD-10-CM

## 2024-02-15 NOTE — Progress Notes (Signed)
 Reason for Consult: Cough Referring Physician: Dr. Kayla Hoose P [name] is an 68 y.o. female.  HPI: She is here for a cough.  It is a dry cough.  Is been going on for about 4 months.  It now over the last 2 to 3 weeks has just about stopped.  She has had a problem with reflux and that was primarily at night with reflux heartburn problems.  She was placed on twice daily Protonix .  That was about 1 year ago.  She now is having the cough only when she lies down at night.  She has no dysphagia or odynophagia.  No change in her voice.  She has no sore throat or pain.  When she lies down she will sometimes feel a tickle in the throat that makes the cough start.  She has been stopped of lisinopril  in November.  She did get a chest x-ray by her primary care  Past Medical History:  Diagnosis Date   Allergic rhinitis    ASTHMA    BICUSPID AORTIC VALVE    CEREBROVASCULAR DISEASE    DEGENERATIVE JOINT DISEASE    Diabetes mellitus    DJD (degenerative joint disease)    GERD    HSV-1 (herpes simplex virus 1) infection    HYPERLIPIDEMIA    Hypertension    Palpitation    Vitamin D  deficiency     Past Surgical History:  Procedure Laterality Date   ABDOMINAL HYSTERECTOMY     EYE SURGERY     repair of trigger finger      Family History  Problem Relation Age of Onset   Coronary artery disease Mother    Heart disease Mother    Hypertension Mother    Sleep apnea Mother    Diabetes Father    Hypertension Father    Diabetes Son    Breast cancer Maternal Aunt     Social History:  reports that she has never smoked. She has never used smokeless tobacco. She reports that she does not currently use alcohol. She reports that she does not use drugs.  Allergies: Allergies[1]   No results found for this or any previous visit (from the past 48 hours).  No results found.  ROS Blood pressure (!) 146/80, pulse 92, temperature 98.2 F (36.8 C), height 4' 10 (1.473 m), weight 129 lb (58.5  kg), SpO2 96%. Physical Exam Constitutional:      Appearance: Normal appearance.  HENT:     Head: Normocephalic and atraumatic.     Right Ear: Tympanic membrane is without lesions and middle ear aerated, ear canal and external ear normal.     Left Ear: Tympanic membrane is without lesions and middle ear aerated, ear canal and external ear normal.     Nose: Nose without deviation of septum.  Turbinates with mild hypertrophy, No significant swelling or masses.     Oral cavity/oropharynx: Mucous membranes are moist. No lesions or masses    Larynx: normal voice. Mirror attempted without success    Eyes:     Extraocular Movements: Extraocular movements intact.     Conjunctiva/sclera: Conjunctivae normal.     Pupils: Pupils are equal, round, and reactive to light.  Cardiovascular:     Rate and Rhythm: Normal rate.  Pulmonary:     Effort: Pulmonary effort is normal.  Musculoskeletal:     Cervical back: Normal range of motion and neck supple. No rigidity.  Lymphadenopathy:     Cervical: No cervical adenopathy or masses.salivary glands  without lesions. .     Salivary glands- no mass or swelling Neurological:     Mental Status: He is alert. CN 2-12 intact. No nystagmus      Assessment/Plan: Cough-she is coughing only when she lies down at night.  This would suggest that the cough is still a reflux issue however it has just about resolved in the last few weeks.  I would recommend that she start Pepcid  and take it before going to bed to block the basal acid secretion.  We talked about a fiberoptic exam.  She will follow-up in 1 month and if she still has any cough symptoms then proceed with a fiberoptic scope then.  [name] Notice 02/15/2024, 8:32 AM         [1]  Allergies Allergen Reactions   Naprosyn [Naproxen] Other (See Comments)    GI upset

## 2024-02-20 DIAGNOSIS — M25441 Effusion, right hand: Secondary | ICD-10-CM

## 2024-02-20 DIAGNOSIS — I10 Essential (primary) hypertension: Secondary | ICD-10-CM

## 2024-02-20 DIAGNOSIS — E1169 Type 2 diabetes mellitus with other specified complication: Secondary | ICD-10-CM

## 2024-02-20 NOTE — Telephone Encounter (Signed)
 Prescription Request  02/20/2024  LOV: 02/02/2024  What is the name of the medication or equipment?   hydrochlorothiazide  (HYDRODIURIL ) 25 MG tablet   rosuvastatin  (CRESTOR ) 20 MG tablet   potassium chloride  SA (KLOR-CON  M) 20 MEQ tablet  **90 day script requested**  Have you contacted your pharmacy to request a refill? Yes   Which pharmacy would you like this sent to?  [facility] - [place], [place] - [address] [place] [address] Phone: [phone] Fax: [phone]    Patient notified that their request is being sent to the clinical staff for review and that they should receive a response within 2 business days.   Please advise pharmacist.

## 2024-02-21 ENCOUNTER — Other Ambulatory Visit: Payer: Self-pay

## 2024-02-21 DIAGNOSIS — E1169 Type 2 diabetes mellitus with other specified complication: Secondary | ICD-10-CM

## 2024-02-21 DIAGNOSIS — M79604 Pain in right leg: Secondary | ICD-10-CM

## 2024-02-21 DIAGNOSIS — E1340 Other specified diabetes mellitus with diabetic neuropathy, unspecified: Secondary | ICD-10-CM

## 2024-02-21 DIAGNOSIS — I10 Essential (primary) hypertension: Secondary | ICD-10-CM

## 2024-02-21 DIAGNOSIS — M25441 Effusion, right hand: Secondary | ICD-10-CM

## 2024-02-21 DIAGNOSIS — M5432 Sciatica, left side: Secondary | ICD-10-CM

## 2024-02-21 MED ORDER — ALBUTEROL SULFATE HFA 108 (90 BASE) MCG/ACT IN AERS: 1.0000 | INHALATION_SPRAY | RESPIRATORY_TRACT | 1 refills | Status: AC | PRN

## 2024-02-21 MED ORDER — POTASSIUM CHLORIDE CRYS ER 20 MEQ PO TBCR: EXTENDED_RELEASE_TABLET | ORAL | 3 refills | Status: AC

## 2024-02-21 MED ORDER — ROPINIROLE HCL 1 MG PO TABS: ORAL_TABLET | ORAL | 3 refills | Status: AC

## 2024-02-21 MED ORDER — ROSUVASTATIN CALCIUM 20 MG PO TABS: ORAL_TABLET | ORAL | 3 refills | Status: AC

## 2024-02-21 MED ORDER — GABAPENTIN 600 MG PO TABS: ORAL_TABLET | ORAL | 3 refills | Status: AC

## 2024-02-21 NOTE — Progress Notes (Signed)
 [name]                                          MRN: [id number]   02/21/2024   The [facility] Quality Team Specialist reviewed this patient medical record for the purposes of chart review for care gap closure. The following were reviewed: chart review for care gap closure-glycemic status assessment.    [facility] Quality Team

## 2024-02-22 NOTE — Telephone Encounter (Signed)
 Requests too early. Requested Prescriptions  Refused Prescriptions Disp Refills   hydrochlorothiazide  (HYDRODIURIL ) 25 MG tablet 90 tablet 3    Sig: Take 1 tablet (25 mg total) by mouth daily.     Cardiovascular: Diuretics - Thiazide Failed - 02/22/2024 10:04 AM      Failed - Cr in normal range and within 180 days    Creat  Date Value Ref Range Status  02/02/2024 0.48 (L) 0.50 - 1.05 mg/dL Final   Creatinine, Urine  Date Value Ref Range Status  07/08/2023 68 20 - 275 mg/dL Final         Failed - Last BP in normal range    BP Readings from Last 1 Encounters:  02/15/24 (!) 146/80         Passed - K in normal range and within 180 days    Potassium  Date Value Ref Range Status  02/02/2024 4.3 3.5 - 5.3 mmol/L Final         Passed - Na in normal range and within 180 days    Sodium  Date Value Ref Range Status  02/02/2024 138 135 - 146 mmol/L Final  02/22/2020 139 134 - 144 mmol/L Final         Passed - Valid encounter within last 6 months    Recent Outpatient Visits           2 weeks ago Essential hypertension   [facility] Kayla Jeoffrey RAMAN, FNP   1 month ago Essential hypertension   [facility] Kayla Jeoffrey RAMAN, FNP   2 months ago Chronic cough   [place] [facility] Duanne, Butler DASEN, [place]   3 months ago Gastroesophageal reflux disease without esophagitis   [place] [facility] Kayla Jeoffrey RAMAN, FNP   3 months ago Subacute cough   [place] [facility] Kayla, Jeoffrey RAMAN, FNP               rosuvastatin  (CRESTOR ) 20 MG tablet 90 tablet 3    Sig: TAKE 1 TABLET EVERY DAY FOR CHOLESTEROL     Cardiovascular:  Antilipid - Statins 2 Failed - 02/22/2024 10:04 AM      Failed - Cr in normal range and within 360 days    Creat  Date Value Ref Range Status  02/02/2024 0.48 (L) 0.50 - 1.05 mg/dL Final   Creatinine, Urine  Date Value Ref Range Status  07/08/2023  68 20 - 275 mg/dL Final         Failed - Lipid Panel in normal range within the last 12 months    Cholesterol  Date Value Ref Range Status  01/16/2024 110 <200 mg/dL Final   LDL Cholesterol (Calc)  Date Value Ref Range Status  01/16/2024 48 mg/dL (calc) Final    Comment:    Reference range: <100 . Desirable range <100 mg/dL for primary prevention;   <70 mg/dL for patients with CHD or diabetic patients  with > or = 2 CHD risk factors. SABRA LDL-C is now calculated using the Martin-Hopkins  calculation, which is a validated novel method providing  better accuracy than the Friedewald equation in the  estimation of LDL-C.  Gladis APPLETHWAITE et al. SANDREA. 7986;689(80): 2061-2068  (http://education.[employer].com/faq/FAQ164)    HDL  Date Value Ref Range Status  01/16/2024 50 > OR = 50 mg/dL Final   Triglycerides  Date Value Ref Range Status  01/16/2024 43 <150 mg/dL Final  Passed - Patient is not pregnant      Passed - Valid encounter within last 12 months    Recent Outpatient Visits           2 weeks ago Essential hypertension   [place] [facility] Kayla Jeoffrey RAMAN, FNP   1 month ago Essential hypertension   [place] [facility] Kayla Jeoffrey RAMAN, FNP   2 months ago Chronic cough   [facility] Duanne, Butler DASEN, [place]   3 months ago Gastroesophageal reflux disease without esophagitis   [place] [facility] Kayla Jeoffrey RAMAN, FNP   3 months ago Subacute cough   [place] [facility] Kayla, Jeoffrey RAMAN, FNP               potassium chloride  SA (KLOR-CON  M) 20 MEQ tablet 270 tablet 3    Sig: Take 1 tablet 3 x /day with Meals for Potassium                                                                /                                           TAKE                                      BY                                       MOUTH     Endocrinology:  Minerals -  Potassium Supplementation Failed - 02/22/2024 10:04 AM      Failed - Cr in normal range and within 360 days    Creat  Date Value Ref Range Status  02/02/2024 0.48 (L) 0.50 - 1.05 mg/dL Final   Creatinine, Urine  Date Value Ref Range Status  07/08/2023 68 20 - 275 mg/dL Final         Passed - K in normal range and within 360 days    Potassium  Date Value Ref Range Status  02/02/2024 4.3 3.5 - 5.3 mmol/L Final         Passed - Valid encounter within last 12 months    Recent Outpatient Visits           2 weeks ago Essential hypertension   [facility] Kayla Jeoffrey RAMAN, FNP   1 month ago Essential hypertension   [place] [facility] Kayla Jeoffrey RAMAN, FNP   2 months ago Chronic cough   [place] [facility] Duanne Butler DASEN, [place]   3 months ago Gastroesophageal reflux disease without esophagitis   [place] [facility] Kayla Jeoffrey RAMAN, FNP   3 months ago Subacute cough   [place] [facility] Kayla Jeoffrey RAMAN, [place]

## 2024-03-06 ENCOUNTER — Telehealth: Payer: Self-pay

## 2024-03-06 NOTE — Telephone Encounter (Signed)
 Copied from CRM #[id number]. Topic: Referral - Status >> Mar 06, 2024 12:59 PM Ivette P wrote: Reason for CRM: PT calling in to get number ot ENT and Gastro to reach out and schedule.    Pt is wanting to reopen the referral for the ENT and would like to get the contact information to the ENT so she can call them and schedule an appt. Could not find office information in Referral # [id number]

## 2024-03-07 ENCOUNTER — Telehealth (INDEPENDENT_AMBULATORY_CARE_PROVIDER_SITE_OTHER): Payer: Self-pay | Admitting: Otolaryngology

## 2024-03-07 NOTE — Telephone Encounter (Signed)
 The patient called in and left a voicemail on 03/06/2024 at 4:09 expressing that she is still coughing. I called her this morning and let her know I would pass along her concerns to Dr Roark and his assistant to see what he would advise as the next step for this patient.  She currently has an appointment for a follow up on 03/21/2024 at 8:20am.

## 2024-03-07 NOTE — Telephone Encounter (Signed)
 I addressed the cough issue with Dr. Roark. He is booked up until patients next visit 03/21/24 at 8:20. I spoke with patient and informed her. She stated that the appointment is fine to stay where it is.

## 2024-03-16 LAB — OPHTHALMOLOGY REPORT-SCANNED

## 2024-03-21 ENCOUNTER — Ambulatory Visit (INDEPENDENT_AMBULATORY_CARE_PROVIDER_SITE_OTHER): Admitting: Otolaryngology

## 2024-03-21 ENCOUNTER — Encounter (INDEPENDENT_AMBULATORY_CARE_PROVIDER_SITE_OTHER): Payer: Self-pay | Admitting: Otolaryngology

## 2024-03-21 VITALS — BP 142/82 | HR 92 | Temp 97.9°F

## 2024-03-21 DIAGNOSIS — R059 Cough, unspecified: Secondary | ICD-10-CM | POA: Diagnosis not present

## 2024-03-21 NOTE — Progress Notes (Signed)
 Reason for Consult: Cough Referring Physician: Dr. Kayla Hoose [name] is an 69 y.o. female.  HPI: She is here for follow-up.  She was placed on a nocturnal H2 blocker.  She was previously here for cough that was improving.  Past Medical History:  Diagnosis Date   Allergic rhinitis    ASTHMA    BICUSPID AORTIC VALVE    CEREBROVASCULAR DISEASE    DEGENERATIVE JOINT DISEASE    Diabetes mellitus    DJD (degenerative joint disease)    GERD    HSV-1 (herpes simplex virus 1) infection    HYPERLIPIDEMIA    Hypertension    Palpitation    Vitamin D  deficiency     Past Surgical History:  Procedure Laterality Date   ABDOMINAL HYSTERECTOMY     EYE SURGERY     repair of trigger finger      Family History  Problem Relation Age of Onset   Coronary artery disease Mother    Heart disease Mother    Hypertension Mother    Sleep apnea Mother    Diabetes Father    Hypertension Father    Diabetes Son    Breast cancer Maternal Aunt     Social History:  reports that she has never smoked. She has never used smokeless tobacco. She reports that she does not currently use alcohol. She reports that she does not use drugs.  Allergies: Allergies[1]   No results found for this or any previous visit (from the past 48 hours).  No results found.  ROS There were no vitals taken for this visit. Physical Exam Constitutional:      Appearance: Normal appearance.  HENT:     Head: Normocephalic and atraumatic.     Right Ear: Tympanic membrane is without lesions and middle ear aerated, ear canal and external ear normal.     Left Ear: Tympanic membrane is without lesions and middle ear aerated, ear canal and external ear normal.     Nose: Nose without deviation of septum.  Turbinates with mild hypertrophy, No significant swelling or masses.     Oral cavity/oropharynx: Mucous membranes are moist. No lesions or masses    Larynx: normal voice. Mirror attempted without success    Eyes:      Extraocular Movements: Extraocular movements intact.     Conjunctiva/sclera: Conjunctivae normal.     Pupils: Pupils are equal, round, and reactive to light.  Cardiovascular:     Rate and Rhythm: Normal rate.  Pulmonary:     Effort: Pulmonary effort is normal.  Musculoskeletal:     Cervical back: Normal range of motion and neck supple. No rigidity.  Lymphadenopathy:     Cervical: No cervical adenopathy or masses.salivary glands without lesions. .     Salivary glands- no mass or swelling Neurological:     Mental Status: He is alert. CN 2-12 intact. No nystagmus   Flexible fibroptic laryngoscopy  Patient was informed of risks, benefits, and options. All questions answered. Consent obtained.   The scope was passed through the nose and tracked into the nasopharynx. The nasopharynx without lesions or masses. The scope was positioned over the base of tongue and epiglottis. There was no obvious lesions or significant swelling and any of the laryngeal or pharyngeal structures. The vocal cords move well. The subglottis has minimal visualization but no lesions identified. The scope was removed without difficulty and patient tolerated well.     Assessment/Plan: Cough-at this point with a normal fiberoptic exam and the  fact that she has a tickle and points right to the location of the hyoid bone I think a injection of the superior laryngeal nerve is a consideration.  I will talk to Dr. Anice about this and she will then follow-up with him if he is willing to proceed with the injection.  The patient is in agreement and wants to proceed.  [name] Notice 03/21/2024, 8:17 AM        [1]  Allergies Allergen Reactions   Naprosyn [Naproxen] Other (See Comments)    GI upset

## 2024-03-28 ENCOUNTER — Ambulatory Visit (INDEPENDENT_AMBULATORY_CARE_PROVIDER_SITE_OTHER)

## 2024-03-30 ENCOUNTER — Ambulatory Visit: Admitting: Gastroenterology

## 2024-04-30 ENCOUNTER — Ambulatory Visit (INDEPENDENT_AMBULATORY_CARE_PROVIDER_SITE_OTHER)

## 2024-05-02 ENCOUNTER — Ambulatory Visit: Admitting: Family Medicine

## 2024-06-08 ENCOUNTER — Ambulatory Visit: Admitting: Nurse Practitioner
